# Patient Record
Sex: Female | Born: 1965 | Race: White | Hispanic: No | Smoking: Never smoker
Health system: Southern US, Community
[De-identification: ages and names within clinical notes are randomized; demographics above are authoritative.]

## PROBLEM LIST (undated history)

## (undated) DIAGNOSIS — K589 Irritable bowel syndrome without diarrhea: Secondary | ICD-10-CM

## (undated) DIAGNOSIS — R3915 Urgency of urination: Secondary | ICD-10-CM

## (undated) DIAGNOSIS — G8929 Other chronic pain: Secondary | ICD-10-CM

## (undated) DIAGNOSIS — F909 Attention-deficit hyperactivity disorder, unspecified type: Secondary | ICD-10-CM

## (undated) DIAGNOSIS — R110 Nausea: Secondary | ICD-10-CM

## (undated) DIAGNOSIS — D649 Anemia, unspecified: Secondary | ICD-10-CM

## (undated) DIAGNOSIS — R351 Nocturia: Secondary | ICD-10-CM

## (undated) DIAGNOSIS — R319 Hematuria, unspecified: Secondary | ICD-10-CM

## (undated) DIAGNOSIS — R3989 Other symptoms and signs involving the genitourinary system: Secondary | ICD-10-CM

## (undated) DIAGNOSIS — F988 Other specified behavioral and emotional disorders with onset usually occurring in childhood and adolescence: Secondary | ICD-10-CM

## (undated) DIAGNOSIS — R600 Localized edema: Secondary | ICD-10-CM

## (undated) DIAGNOSIS — D61818 Other pancytopenia: Secondary | ICD-10-CM

## (undated) DIAGNOSIS — G43909 Migraine, unspecified, not intractable, without status migrainosus: Secondary | ICD-10-CM

## (undated) DIAGNOSIS — T148XXA Other injury of unspecified body region, initial encounter: Secondary | ICD-10-CM

## (undated) DIAGNOSIS — R32 Unspecified urinary incontinence: Secondary | ICD-10-CM

## (undated) DIAGNOSIS — C539 Malignant neoplasm of cervix uteri, unspecified: Secondary | ICD-10-CM

## (undated) DIAGNOSIS — N135 Crossing vessel and stricture of ureter without hydronephrosis: Secondary | ICD-10-CM

## (undated) DIAGNOSIS — T8859XA Other complications of anesthesia, initial encounter: Secondary | ICD-10-CM

## (undated) DIAGNOSIS — N3020 Other chronic cystitis without hematuria: Secondary | ICD-10-CM

## (undated) DIAGNOSIS — R5383 Other fatigue: Secondary | ICD-10-CM

## (undated) DIAGNOSIS — C801 Malignant (primary) neoplasm, unspecified: Secondary | ICD-10-CM

## (undated) DIAGNOSIS — T451X5A Adverse effect of antineoplastic and immunosuppressive drugs, initial encounter: Secondary | ICD-10-CM

## (undated) HISTORY — DX: Malignant (primary) neoplasm, unspecified: C80.1

## (undated) HISTORY — DX: Other specified behavioral and emotional disorders with onset usually occurring in childhood and adolescence: F98.8

## (undated) HISTORY — DX: Migraine, unspecified, not intractable, without status migrainosus: G43.909

---
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
## 2015-02-12 HISTORY — PX: BREAST ENHANCEMENT SURGERY: SHX7

## 2015-10-11 ENCOUNTER — Ambulatory Visit (INDEPENDENT_AMBULATORY_CARE_PROVIDER_SITE_OTHER): Payer: BLUE CROSS/BLUE SHIELD | Admitting: Physician Assistant

## 2015-10-11 ENCOUNTER — Encounter: Payer: Self-pay | Admitting: Physician Assistant

## 2015-10-11 VITALS — BP 137/98 | HR 79 | Temp 97.1°F | Ht 63.0 in | Wt 133.2 lb

## 2015-10-11 DIAGNOSIS — K589 Irritable bowel syndrome without diarrhea: Secondary | ICD-10-CM

## 2015-10-11 DIAGNOSIS — J029 Acute pharyngitis, unspecified: Secondary | ICD-10-CM

## 2015-10-11 DIAGNOSIS — F900 Attention-deficit hyperactivity disorder, predominantly inattentive type: Secondary | ICD-10-CM

## 2015-10-11 DIAGNOSIS — G43809 Other migraine, not intractable, without status migrainosus: Secondary | ICD-10-CM | POA: Diagnosis not present

## 2015-10-11 MED ORDER — ADDERALL XR 30 MG PO CP24: 30.0000 mg | ORAL_CAPSULE | Freq: Every day | ORAL | 0 refills | Status: DC

## 2015-10-11 MED ORDER — AZITHROMYCIN 250 MG PO TABS: ORAL_TABLET | ORAL | 0 refills | Status: DC

## 2015-10-11 MED ORDER — SUMATRIPTAN SUCCINATE 100 MG PO TABS: 100.0000 mg | ORAL_TABLET | Freq: Once | ORAL | 11 refills | Status: DC

## 2015-10-11 NOTE — Progress Notes (Signed)
BP (!) 137/98   Pulse 79   Temp 97.1 F (36.2 C) (Oral)   Ht 5\' 3"  (1.6 m)   Wt 133 lb 3.2 oz (60.4 kg)   BMI 23.60 kg/m    Subjective:    Patient ID: Sheena Torres, female    DOB: Nov 12, 1965, 50 y.o.   MRN: [id number]  Sheena Torres is a 50 y.o. female presenting on 10/11/2015 for Medication Refill; Migraine (wants to discuss. She states that she has been experiencing them a lot lately. She would like to discuss possible CT scan.); and Sore Throat   HPI    Relevant past medical, surgical, family and social history reviewed and updated as indicated. Interim medical history since our last visit reviewed. Allergies and medications reviewed and updated.   Data reviewed from any sources in EPIC.  Review of Systems  Constitutional: Negative.  Negative for activity change, fatigue and fever.  HENT: Positive for congestion, sore throat and voice change.   Eyes: Negative.   Respiratory: Negative.  Negative for cough.   Cardiovascular: Negative.  Negative for chest pain.  Gastrointestinal: Positive for abdominal distention, abdominal pain and constipation.  Endocrine: Negative.   Genitourinary: Negative.  Negative for dysuria.  Musculoskeletal: Negative.   Skin: Negative.   Neurological: Negative.     Per HPI unless specifically indicated above  Social History   Social History  . Marital status: Divorced    Spouse name: N/A  . Number of children: N/A  . Years of education: N/A   Occupational History  . Not on file.   Social History Main Topics  . Smoking status: Never Smoker  . Smokeless tobacco: Never Used  . Alcohol use Yes     Comment: occasional   . Drug use: No  . Sexual activity: Not on file   Other Topics Concern  . Not on file   Social History Narrative  . No narrative on file    History reviewed. No pertinent surgical history.  Family History  Problem Relation Age of Onset  . Heart disease Mother   . Diabetes Father       Medication List         Accurate as of 10/11/15  9:04 AM. Always use your most recent med list.          ADDERALL XR 30 MG 24 hr capsule Generic drug:  amphetamine-dextroamphetamine Take 1 capsule (30 mg total) by mouth daily. Fill 60 days from original script date   azithromycin 250 MG tablet Commonly known as:  ZITHROMAX Z-PAK As directed   naproxen sodium 220 MG tablet Commonly known as:  ANAPROX Take 220 mg by mouth 2 (two) times daily with a meal.   SUMAtriptan 100 MG tablet Commonly known as:  IMITREX Take 1 tablet (100 mg total) by mouth once. May repeat in 2 hours if no relief.          Objective:    BP (!) 137/98   Pulse 79   Temp 97.1 F (36.2 C) (Oral)   Ht 5\' 3"  (1.6 m)   Wt 133 lb 3.2 oz (60.4 kg)   BMI 23.60 kg/m   No Known Allergies Wt Readings from Last 3 Encounters:  10/11/15 133 lb 3.2 oz (60.4 kg)    Physical Exam  Constitutional: She is oriented to person, place, and time. She appears well-developed and well-nourished.  HENT:  Head: Normocephalic and atraumatic.  Right Ear: Tympanic membrane, external ear and ear canal normal.  Left  Ear: Tympanic membrane, external ear and ear canal normal.  Nose: Mucosal edema and rhinorrhea present.  Mouth/Throat: Uvula swelling present. Oropharyngeal exudate and posterior oropharyngeal erythema present.  Eyes: Conjunctivae and EOM are normal. Pupils are equal, round, and reactive to light.  Neck: Normal range of motion. Neck supple.  Cardiovascular: Normal rate, regular rhythm, normal heart sounds and intact distal pulses.   Pulmonary/Chest: Effort normal and breath sounds normal.  Abdominal: Soft. Bowel sounds are normal.  Neurological: She is alert and oriented to person, place, and time. She has normal reflexes.  Skin: Skin is warm and dry. No rash noted.  Psychiatric: She has a normal mood and affect. Her behavior is normal. Judgment and thought content normal.    No results found for this or any previous visit.     Assessment & Plan:   1. Other type of migraine without status migrainosus Avoid triggers, consider prevention propranolol Patient to find out about her insurance coverage for radiology services - naproxen sodium (ANAPROX) 220 MG tablet; Take 220 mg by mouth 2 (two) times daily with a meal. - SUMAtriptan (IMITREX) 100 MG tablet; Take 1 tablet (100 mg total) by mouth once. May repeat in 2 hours if no relief.  Dispense: 10 tablet; Refill: 11  2. ADHD (attention deficit hyperactivity disorder), inattentive type - ADDERALL XR 30 MG 24 hr capsule; Take 1 capsule (30 mg total) by mouth daily. Fill 60 days from original script date  Dispense: 30 capsule; Refill: 0  3. Acute pharyngitis, unspecified etiology - azithromycin (ZITHROMAX Z-PAK) 250 MG tablet; As directed  Dispense: 6 tablet; Refill: 0  4. IBS (irritable bowel syndrome) Dietary changes and monitor trigger foods   Continue all other maintenance medications as listed above.  Follow up plan: Return in about 3 months (around 01/11/2016), or recheck meds.  Terald Sleeper PA-C [facility] [address]  [place], [place] [address] [phone]   10/11/2015, 9:04 AM

## 2015-10-11 NOTE — Patient Instructions (Signed)
Diet for Irritable Bowel Syndrome When you have irritable bowel syndrome (IBS), the foods you eat and your eating habits are very important. IBS may cause various symptoms, such as abdominal pain, constipation, or diarrhea. Choosing the right foods can help ease discomfort caused by these symptoms. Work with your health care provider and dietitian to find the best eating plan to help control your symptoms. WHAT GENERAL GUIDELINES DO I NEED TO FOLLOW?  Keep a food diary. This will help you identify foods that cause symptoms. Write down:  What you eat and when.  What symptoms you have.  When symptoms occur in relation to your meals.  Avoid foods that cause symptoms. Talk with your dietitian about other ways to get the same nutrients that are in these foods.  Eat more foods that contain fiber. Take a fiber supplement if directed by your dietitian.  Eat your meals slowly, in a relaxed setting.  Aim to eat 5-6 small meals per day. Do not skip meals.  Drink enough fluids to keep your urine clear or pale yellow.  Ask your health care provider if you should take an over-the-counter probiotic during flare-ups to help restore healthy gut bacteria.  If you have cramping or diarrhea, try making your meals low in fat and high in carbohydrates. Examples of carbohydrates are pasta, rice, whole grain breads and cereals, fruits, and vegetables.  If dairy products cause your symptoms to flare up, try eating less of them. You might be able to handle yogurt better than other dairy products because it contains bacteria that help with digestion. WHAT FOODS ARE NOT RECOMMENDED? The following are some foods and drinks that may worsen your symptoms:  Fatty foods, such as French fries.  Milk products, such as cheese or ice cream.  Chocolate.  Alcohol.  Products with caffeine, such as coffee.  Carbonated drinks, such as soda. The items listed above may not be a complete list of foods and beverages to  avoid. Contact your dietitian for more information. WHAT FOODS ARE GOOD SOURCES OF FIBER? Your health care provider or dietitian may recommend that you eat more foods that contain fiber. Fiber can help reduce constipation and other IBS symptoms. Add foods with fiber to your diet a little at a time so that your body can get used to them. Too much fiber at once might cause gas and swelling of your abdomen. The following are some foods that are good sources of fiber:  Apples.  Peaches.  Pears.  Berries.  Figs.  Broccoli (raw).  Cabbage.  Carrots.  Raw peas.  Kidney beans.  Lima beans.  Whole grain bread.  Whole grain cereal. FOR MORE INFORMATION  International Foundation for Functional Gastrointestinal Disorders: www.iffgd.org National Institute of Diabetes and Digestive and Kidney Diseases: www.niddk.nih.gov/health-information/health-topics/digestive-diseases/ibs/Pages/facts.aspx   This information is not intended to replace advice given to you by your health care provider. Make sure you discuss any questions you have with your health care provider.   Document Released: 04/20/2003 Document Revised: 02/18/2014 Document Reviewed: 04/30/2013 Elsevier Interactive Patient Education 2016 Elsevier Inc.  

## 2016-01-12 ENCOUNTER — Ambulatory Visit (INDEPENDENT_AMBULATORY_CARE_PROVIDER_SITE_OTHER): Payer: Medicaid Other | Admitting: Physician Assistant

## 2016-01-12 ENCOUNTER — Encounter: Payer: Self-pay | Admitting: Physician Assistant

## 2016-01-12 ENCOUNTER — Telehealth: Payer: Self-pay | Admitting: Physician Assistant

## 2016-01-12 VITALS — BP 128/92 | HR 81 | Temp 97.0°F | Ht 63.0 in | Wt 139.2 lb

## 2016-01-12 DIAGNOSIS — J029 Acute pharyngitis, unspecified: Secondary | ICD-10-CM

## 2016-01-12 DIAGNOSIS — F900 Attention-deficit hyperactivity disorder, predominantly inattentive type: Secondary | ICD-10-CM | POA: Diagnosis not present

## 2016-01-12 DIAGNOSIS — M25469 Effusion, unspecified knee: Secondary | ICD-10-CM | POA: Diagnosis not present

## 2016-01-12 DIAGNOSIS — Z23 Encounter for immunization: Secondary | ICD-10-CM | POA: Diagnosis not present

## 2016-01-12 LAB — CULTURE, GROUP A STREP

## 2016-01-12 LAB — RAPID STREP SCREEN (MED CTR MEBANE ONLY): Strep Gp A Ag, IA W/Reflex: NEGATIVE

## 2016-01-12 MED ORDER — ADDERALL XR 30 MG PO CP24: 30.0000 mg | ORAL_CAPSULE | Freq: Every day | ORAL | 0 refills | Status: DC

## 2016-01-12 MED ORDER — SUMATRIPTAN SUCCINATE 100 MG PO TABS: 100.0000 mg | ORAL_TABLET | Freq: Once | ORAL | 11 refills | Status: DC

## 2016-01-12 MED ORDER — AMPHETAMINE-DEXTROAMPHET ER 30 MG PO CP24: 30.0000 mg | ORAL_CAPSULE | Freq: Every day | ORAL | 0 refills | Status: DC

## 2016-01-12 NOTE — Progress Notes (Signed)
BP (!) 128/92   Pulse 81   Temp 97 F (36.1 C) (Oral)   Ht 5\' 3"  (1.6 m)   Wt 139 lb 3.2 oz (63.1 kg)   Breastfeeding? Unknown   BMI 24.66 kg/m    Subjective:    Patient ID: Sheena Torres, female    DOB: [birth year], 50 y.o.   MRN: [id number]  HPI: Sheena Torres is a 50 y.o. female presenting on 01/12/2016 for Sore Throat; Follow-up; and Medication Refill This patient comes in for periodic recheck on medications and conditions. All medications are reviewed today. There are no reports of any problems with the medications. All of the medical conditions are reviewed and updated.  Lab work is reviewed and will be ordered as medically necessary. There are no new problems reported with today's visit.  Adult ADHD symptoms 1. Fidgeting 0 2. Does not seem to listen to what is being said to him/her 1 3 .Doesn't pay attention to details; makes careless mistakes 1 4. Inattentative, easily distracted. 2 5. Has trouble organizing tasks or activities 2 6. Gives up easily on difficult tasks.1 7. Fidgets or squirms in seat 0 8. Restless or overactive 0 9. Is easily distracted by sights and sounds 1 10. Interrupts others 1  SCORE 9   Past Medical History:  Diagnosis Date  . ADD (attention deficit disorder)   . Migraine    Relevant past medical, surgical, family and social history reviewed and updated as indicated. Interim medical history since our last visit reviewed. Allergies and medications reviewed and updated. DATA REVIEWED: CHART IN EPIC  Social History   Social History  . Marital status: Divorced    Spouse name: N/A  . Number of children: N/A  . Years of education: N/A   Occupational History  . Not on file.   Social History Main Topics  . Smoking status: Never Smoker  . Smokeless tobacco: Never Used  . Alcohol use Yes     Comment: occasional   . Drug use: No  . Sexual activity: Not on file   Other Topics Concern  . Not on file   Social History Narrative  . No  narrative on file    History reviewed. No pertinent surgical history.  Family History  Problem Relation Age of Onset  . Heart disease Mother   . Diabetes Father     Review of Systems  Constitutional: Negative.  Negative for activity change, fatigue and fever.  HENT: Negative.   Eyes: Negative.   Respiratory: Negative.  Negative for cough.   Cardiovascular: Negative.  Negative for chest pain.  Gastrointestinal: Negative.  Negative for abdominal pain.  Endocrine: Negative.   Genitourinary: Negative.  Negative for dysuria.  Musculoskeletal: Negative.   Skin: Negative.   Neurological: Negative.       Medication List       Accurate as of 01/12/16 11:59 PM. Always use your most recent med list.          ADDERALL XR 30 MG 24 hr capsule Generic drug:  amphetamine-dextroamphetamine Take 1 capsule (30 mg total) by mouth daily. Fill 60 days from original script date   amphetamine-dextroamphetamine 30 MG 24 hr capsule Commonly known as:  ADDERALL XR Take 1 capsule (30 mg total) by mouth daily.   amphetamine-dextroamphetamine 30 MG 24 hr capsule Commonly known as:  ADDERALL XR Take 1 capsule (30 mg total) by mouth daily.   naproxen sodium 220 MG tablet Commonly known as:  ANAPROX Take 220 mg by mouth  2 (two) times daily with a meal.   SUMAtriptan 100 MG tablet Commonly known as:  IMITREX Take 1 tablet (100 mg total) by mouth once. May repeat in 2 hours if no relief.          Objective:    BP (!) 128/92   Pulse 81   Temp 97 F (36.1 C) (Oral)   Ht 5\' 3"  (1.6 m)   Wt 139 lb 3.2 oz (63.1 kg)   Breastfeeding? Unknown   BMI 24.66 kg/m   No Known Allergies  Wt Readings from Last 3 Encounters:  01/12/16 139 lb 3.2 oz (63.1 kg)  10/11/15 133 lb 3.2 oz (60.4 kg)    Physical Exam  Constitutional: She is oriented to person, place, and time. She appears well-developed and well-nourished.  HENT:  Head: Normocephalic and atraumatic.  Eyes: Conjunctivae and EOM are  normal. Pupils are equal, round, and reactive to light.  Neck: Normal range of motion. Neck supple.  Cardiovascular: Normal rate, regular rhythm, normal heart sounds and intact distal pulses.   Pulmonary/Chest: Effort normal and breath sounds normal.  Abdominal: Soft. Bowel sounds are normal.  Neurological: She is alert and oriented to person, place, and time. She has normal reflexes.  Skin: Skin is warm and dry. No rash noted.  Psychiatric: She has a normal mood and affect. Her behavior is normal. Judgment and thought content normal.    Results for orders placed or performed in visit on 01/12/16  Rapid strep screen (not at [facility])  Result Value Ref Range   Strep Gp A Ag, IA W/Reflex Negative Negative  Culture, Group A Strep  Result Value Ref Range   Strep A Culture CANCELED       Assessment & Plan:   1. Sore throat URI, supportive - Rapid strep screen (not at [facility])  2. ADHD (attention deficit hyperactivity disorder), inattentive type - ADDERALL XR 30 MG 24 hr capsule; Take 1 capsule (30 mg total) by mouth daily. Fill 60 days from original script date  Dispense: 30 capsule; Refill: 0 - amphetamine-dextroamphetamine (ADDERALL XR) 30 MG 24 hr capsule; Take 1 capsule (30 mg total) by mouth daily.  Dispense: 30 capsule; Refill: 0 - amphetamine-dextroamphetamine (ADDERALL XR) 30 MG 24 hr capsule; Take 1 capsule (30 mg total) by mouth daily.  Dispense: 30 capsule; Refill: 0  3. Knee swelling  4. Encounter for immunization - Flu Vaccine QUAD 36+ mos IM   Continue all other maintenance medications as listed above.  Follow up plan: Return in about 3 months (around 04/11/2016) for recheck meds.  Orders Placed This Encounter  Procedures  . Rapid strep screen (not at [facility])  . Culture, Group A Strep  . Flu Vaccine QUAD 36+ mos IM    Educational handout given for ADHD  Terald Sleeper PA-C [facility] [address]  [place], [place]  [address] [phone]   01/15/2016, 10:03 PM

## 2016-01-12 NOTE — Telephone Encounter (Signed)
Note given to patient.

## 2016-01-12 NOTE — Patient Instructions (Signed)

## 2016-06-06 ENCOUNTER — Telehealth: Payer: Self-pay | Admitting: Physician Assistant

## 2016-06-06 NOTE — Telephone Encounter (Signed)
appts scheduled Pt notified

## 2016-06-14 ENCOUNTER — Ambulatory Visit (INDEPENDENT_AMBULATORY_CARE_PROVIDER_SITE_OTHER): Payer: Medicaid Other | Admitting: Physician Assistant

## 2016-06-14 ENCOUNTER — Encounter: Payer: Self-pay | Admitting: Physician Assistant

## 2016-06-14 ENCOUNTER — Encounter (INDEPENDENT_AMBULATORY_CARE_PROVIDER_SITE_OTHER): Payer: Self-pay

## 2016-06-14 VITALS — BP 104/68 | HR 74 | Temp 98.0°F | Ht 63.0 in | Wt 137.0 lb

## 2016-06-14 DIAGNOSIS — D229 Melanocytic nevi, unspecified: Secondary | ICD-10-CM

## 2016-06-14 DIAGNOSIS — F900 Attention-deficit hyperactivity disorder, predominantly inattentive type: Secondary | ICD-10-CM | POA: Diagnosis not present

## 2016-06-14 MED ORDER — ADDERALL XR 30 MG PO CP24: 30.0000 mg | ORAL_CAPSULE | Freq: Every day | ORAL | 0 refills | Status: DC

## 2016-06-14 MED ORDER — AMPHETAMINE-DEXTROAMPHET ER 30 MG PO CP24: 30.0000 mg | ORAL_CAPSULE | Freq: Every day | ORAL | 0 refills | Status: DC

## 2016-06-14 NOTE — Patient Instructions (Signed)
Seborrheic Keratosis Seborrheic keratosis is a common, noncancerous (benign) skin growth. This condition causes waxy, rough, tan, brown, or black spots to appear on the skin. These skin growths can be flat or raised. What are the causes? The cause of this condition is not known. What increases the risk? This condition is more likely to develop in:  People who have a family history of seborrheic keratosis.  People who are 50 or older.  People who are pregnant.  People who have had estrogen replacement therapy.  What are the signs or symptoms? This condition often occurs on the face, chest, shoulders, back, or other areas. These growths:  Are usually painless, but may become irritated and itchy.  Can be yellow, brown, black, or other colors.  Are slightly raised or have a flat surface.  Are sometimes rough or wart-like in texture.  Are often waxy on the surface.  Are round or oval-shaped.  Sometimes look like they are "stuck on."  Often occur in groups, but may occur as a single growth.  How is this diagnosed? This condition is diagnosed with a medical history and physical exam. A sample of the growth may be tested (skin biopsy). You may need to see a skin specialist (dermatologist). How is this treated? Treatment is not usually needed for this condition, unless the growths are irritated or are often bleeding. You may also choose to have the growths removed if you do not like their appearance. Most commonly, these growths are treated with a procedure in which liquid nitrogen is applied to "freeze" off the growth (cryosurgery). They may also be burned off with electricity or cut off. Follow these instructions at home:  Watch your growth for any changes.  Keep all follow-up visits as told by your health care provider. This is important.  Do not scratch or pick at the growth or growths. This can cause them to become irritated or infected. Contact a health care provider  if:  You suddenly have many new growths.  Your growth bleeds, itches, or hurts.  Your growth suddenly becomes larger or changes color. This information is not intended to replace advice given to you by your health care provider. Make sure you discuss any questions you have with your health care provider. Document Released: 03/02/2010 Document Revised: 07/06/2015 Document Reviewed: 06/15/2014 Elsevier Interactive Patient Education  2017 Elsevier Inc.  

## 2016-06-17 NOTE — Progress Notes (Signed)
BP 104/68   Pulse 74   Temp 98 F (36.7 C) (Oral)   Ht 5\' 3"  (1.6 m)   Wt 137 lb (62.1 kg)   BMI 24.27 kg/m    Subjective:    Patient ID: Rosary Filosa, female    DOB: Jul 03, 1965, 51 y.o.   MRN: [id number]  HPI: Roya Gieselman is a 51 y.o. female presenting on 06/14/2016 for Referral (to Dermatology for ? skin Cancer )  This patient comes in for periodic recheck on medications and conditions including abnormal nevus on chest.  ADHD meds are due.   All medications are reviewed today. There are no reports of any problems with the medications. All of the medical conditions are reviewed and updated.  Lab work is reviewed and will be ordered as medically necessary. There are no new problems reported with today's visit.   Relevant past medical, surgical, family and social history reviewed and updated as indicated. Allergies and medications reviewed and updated.  Past Medical History:  Diagnosis Date  . ADD (attention deficit disorder)   . Migraine     History reviewed. No pertinent surgical history.  Review of Systems  Constitutional: Negative.   HENT: Negative.   Eyes: Negative.   Respiratory: Negative.   Gastrointestinal: Negative.   Genitourinary: Negative.   Skin: Positive for rash.    Allergies as of 06/14/2016   No Known Allergies     Medication List       Accurate as of 06/14/16 11:59 PM. Always use your most recent med list.          ADDERALL XR 30 MG 24 hr capsule Generic drug:  amphetamine-dextroamphetamine Take 1 capsule (30 mg total) by mouth daily. Fill 60 days from original script date   amphetamine-dextroamphetamine 30 MG 24 hr capsule Commonly known as:  ADDERALL XR Take 1 capsule (30 mg total) by mouth daily.   amphetamine-dextroamphetamine 30 MG 24 hr capsule Commonly known as:  ADDERALL XR Take 1 capsule (30 mg total) by mouth daily.   naproxen sodium 220 MG tablet Commonly known as:  ANAPROX Take 220 mg by mouth 2 (two) times daily with a  meal.   SUMAtriptan 100 MG tablet Commonly known as:  IMITREX Take 1 tablet (100 mg total) by mouth once. May repeat in 2 hours if no relief.          Objective:    BP 104/68   Pulse 74   Temp 98 F (36.7 C) (Oral)   Ht 5\' 3"  (1.6 m)   Wt 137 lb (62.1 kg)   BMI 24.27 kg/m   No Known Allergies  Physical Exam  Constitutional: She is oriented to person, place, and time. She appears well-developed and well-nourished.  HENT:  Head: Normocephalic and atraumatic.  Eyes: Conjunctivae and EOM are normal. Pupils are equal, round, and reactive to light.  Cardiovascular: Normal rate, regular rhythm, normal heart sounds and intact distal pulses.   Pulmonary/Chest: Effort normal and breath sounds normal.  Abdominal: Soft. Bowel sounds are normal.  Neurological: She is alert and oriented to person, place, and time. She has normal reflexes.  Skin: Skin is warm and dry. Rash noted. Rash is maculopapular. There is erythema.     Psychiatric: She has a normal mood and affect. Her behavior is normal. Judgment and thought content normal.    Results for orders placed or performed in visit on 01/12/16  Rapid strep screen (not at [facility])  Result Value Ref Range   Strep  Gp A Ag, IA W/Reflex Negative Negative  Culture, Group A Strep  Result Value Ref Range   Strep A Culture CANCELED       Assessment & Plan:   1. ADHD (attention deficit hyperactivity disorder), inattentive type - ADDERALL XR 30 MG 24 hr capsule; Take 1 capsule (30 mg total) by mouth daily. Fill 60 days from original script date  Dispense: 30 capsule; Refill: 0 - amphetamine-dextroamphetamine (ADDERALL XR) 30 MG 24 hr capsule; Take 1 capsule (30 mg total) by mouth daily.  Dispense: 30 capsule; Refill: 0 - amphetamine-dextroamphetamine (ADDERALL XR) 30 MG 24 hr capsule; Take 1 capsule (30 mg total) by mouth daily.  Dispense: 30 capsule; Refill: 0  2. Nevus - Ambulatory referral to Dermatology   Continue all other maintenance  medications as listed above.  Follow up plan: No Follow-up on file.  Educational handout given for nevus  Terald Sleeper PA-C [facility] [address]  [place], [place] [address] [phone]   06/17/2016, 12:52 PM

## 2016-06-18 ENCOUNTER — Ambulatory Visit: Payer: Medicaid Other | Admitting: Physician Assistant

## 2016-07-25 ENCOUNTER — Ambulatory Visit (INDEPENDENT_AMBULATORY_CARE_PROVIDER_SITE_OTHER): Payer: Medicaid Other | Admitting: Pediatrics

## 2016-07-25 ENCOUNTER — Encounter: Payer: Self-pay | Admitting: Pediatrics

## 2016-07-25 VITALS — BP 134/90 | HR 79 | Temp 98.2°F | Ht 63.0 in | Wt 135.2 lb

## 2016-07-25 DIAGNOSIS — J069 Acute upper respiratory infection, unspecified: Secondary | ICD-10-CM | POA: Diagnosis not present

## 2016-07-25 DIAGNOSIS — J029 Acute pharyngitis, unspecified: Secondary | ICD-10-CM | POA: Diagnosis not present

## 2016-07-25 LAB — RAPID STREP SCREEN (MED CTR MEBANE ONLY): Strep Gp A Ag, IA W/Reflex: NEGATIVE

## 2016-07-25 LAB — CULTURE, GROUP A STREP

## 2016-07-25 MED ORDER — AMOXICILLIN 875 MG PO TABS: 875.0000 mg | ORAL_TABLET | Freq: Two times a day (BID) | ORAL | 0 refills | Status: DC

## 2016-07-25 NOTE — Progress Notes (Signed)
  Subjective:   Patient ID: Sheena Torres, female    DOB: 19-Oct-1965, 51 y.o.   MRN: [id number] CC: Sore Throat  HPI: Sheena Torres is a 51 y.o. female presenting for Sore Throat  Started several days ago Feeling worse today No fevers Appetite is ok Some coughing Throat feels scratchy Some congestion  Relevant past medical, surgical, family and social history reviewed. Allergies and medications reviewed and updated. History  Smoking Status  . Never Smoker  Smokeless Tobacco  . Never Used   ROS: Per HPI   Objective:    BP 134/90   Pulse 79   Temp 98.2 F (36.8 C) (Oral)   Ht 5\' 3"  (1.6 m)   Wt 135 lb 3.2 oz (61.3 kg)   BMI 23.95 kg/m   Wt Readings from Last 3 Encounters:  07/25/16 135 lb 3.2 oz (61.3 kg)  06/14/16 137 lb (62.1 kg)  01/12/16 139 lb 3.2 oz (63.1 kg)    Gen: NAD, alert, cooperative with exam, NCAT EYES: EOMI, no conjunctival injection, or no icterus ENT:  TMs with clear effusion, OP with mild erythema, no pressure over sinuses LYMPH: no cervical LAD CV: NRRR, normal S1/S2, no murmur Resp: CTABL, no wheezes, normal WOB Ext: No edema, warm Neuro: Alert and oriented  Assessment & Plan:  Tala was seen today for sore throat.  Diagnoses and all orders for this visit:  Sore throat Discussed symptom care If worsening ear pain start below -     Rapid strep screen (not at [facility])  Acute URI  Other orders -     amoxicillin (AMOXIL) 875 MG tablet; Take 1 tablet (875 mg total) by mouth 2 (two) times daily. -     Culture, Group A Strep   Follow up plan: As needed Assunta Found, MD [place]

## 2016-07-25 NOTE — Patient Instructions (Addendum)
Netipot with distilled water 2-3 times a day to clear out sinuses Or Normal saline nasal spray  Flonase steroid nasal spray  Antihistamine daily such as cetirizine  Ibuprofen 600mg  three times a day  Lots of fluids

## 2016-10-10 ENCOUNTER — Ambulatory Visit: Payer: Medicaid Other | Admitting: Family Medicine

## 2016-11-14 ENCOUNTER — Encounter: Payer: Self-pay | Admitting: Family Medicine

## 2016-11-14 ENCOUNTER — Ambulatory Visit (INDEPENDENT_AMBULATORY_CARE_PROVIDER_SITE_OTHER): Payer: Self-pay | Admitting: Family Medicine

## 2016-11-14 VITALS — BP 128/86 | HR 79 | Temp 99.8°F | Ht 63.0 in | Wt 134.0 lb

## 2016-11-14 DIAGNOSIS — J020 Streptococcal pharyngitis: Secondary | ICD-10-CM

## 2016-11-14 MED ORDER — AMOXICILLIN 500 MG PO TABS: 500.0000 mg | ORAL_TABLET | Freq: Two times a day (BID) | ORAL | 0 refills | Status: DC

## 2016-11-14 NOTE — Progress Notes (Signed)
BP 128/86   Pulse 79   Temp 99.8 F (37.7 C) (Oral)   Ht 5\' 3"  (1.6 m)   Wt 134 lb (60.8 kg)   BMI 23.74 kg/m    Subjective:    Patient ID: Sheena Torres, female    DOB: 1965-11-09, 51 y.o.   MRN: [id number]  HPI: [name] is a 51 y.o. female presenting on 11/14/2016 for 2 days of constant severe sore throat making it difficult to sleep. She additionally reports fullness in her ears bilaterally x 1 week L>R. She has associated subjective fever and chills. She denies cough, congestion, nausea, vomiting, belly pain, or rash.   HPI Relevant past medical, surgical, family and social history reviewed and updated as indicated. Interim medical history since our last visit reviewed. Allergies and medications reviewed and updated.  Review of Systems  Constitutional: Positive for activity change, chills, fatigue and fever.  HENT: Positive for sore throat. Negative for congestion and rhinorrhea.   Respiratory: Negative for cough, shortness of breath and wheezing.   Cardiovascular: Negative for chest pain, palpitations and leg swelling.  Gastrointestinal: Negative for abdominal pain, nausea and vomiting.  Musculoskeletal: Negative for arthralgias, joint swelling and myalgias.  Skin: Negative for color change, pallor, rash and wound.  Neurological: Positive for headaches. Negative for dizziness and weakness.  Psychiatric/Behavioral: Negative for agitation, behavioral problems and confusion.    Per HPI unless specifically indicated above     Objective:    BP 128/86   Pulse 79   Temp 99.8 F (37.7 C) (Oral)   Ht 5\' 3"  (1.6 m)   Wt 134 lb (60.8 kg)   BMI 23.74 kg/m   Wt Readings from Last 3 Encounters:  11/14/16 134 lb (60.8 kg)  07/25/16 135 lb 3.2 oz (61.3 kg)  06/14/16 137 lb (62.1 kg)    Physical Exam  Constitutional: She is oriented to person, place, and time. She appears well-developed and well-nourished. No distress.  HENT:  Head: Normocephalic and atraumatic.  Right  Ear: External ear normal.  Left Ear: External ear normal.  Mouth/Throat: Oropharyngeal exudate present.  Eyes: Pupils are equal, round, and reactive to light. Conjunctivae and EOM are normal.  Neck: Normal range of motion. Neck supple.  Cardiovascular: Normal rate, regular rhythm and normal heart sounds.   No murmur heard. Pulmonary/Chest: Effort normal and breath sounds normal. No respiratory distress. She has no wheezes. She has no rales. She exhibits no tenderness.  Abdominal: Soft. Bowel sounds are normal. There is no tenderness.  Musculoskeletal: Normal range of motion. She exhibits no edema or tenderness.  Lymphadenopathy:    She has cervical adenopathy.  Neurological: She is alert and oriented to person, place, and time. She has normal reflexes.  Skin: Skin is warm and dry. No rash noted. She is not diaphoretic. No erythema. No pallor.  Psychiatric: She has a normal mood and affect. Her behavior is normal. Judgment and thought content normal.  Nursing note and vitals reviewed.       Assessment & Plan:   Problem List Items Addressed This Visit    None    Visit Diagnoses    Streptococcal pharyngitis    -  Primary   Relevant Medications   amoxicillin (AMOXIL) 500 MG tablet      Sheena Torres is a 51 y.o. female presenting on 11/14/2016 for 2 days of constant severe sore throat making it difficult to sleep. She additionally reports fullness in her ears bilaterally x 1 week L>R. She has  associated subjective fever and chills. On exam she has bilateral tonsillar exudate and tender cervical lymphadenopathy. Based on this she has a Centor score of 2-3 (her temperature was 37.7 here but she's been feverish at home), and with less than 3 days of illness I will treat her empirically for strep with amoxicillin. I additionally wrote her off work for today and tomorrow.   Follow up plan: Return if symptoms worsen or fail to improve.  Patient seen and examined with Brock Bad medical  student. Agree with assessment and plan above. Caryl Pina, MD [facility] 11/14/2016, 9:12 AM

## 2016-11-15 ENCOUNTER — Ambulatory Visit (INDEPENDENT_AMBULATORY_CARE_PROVIDER_SITE_OTHER): Payer: Self-pay | Admitting: Family Medicine

## 2016-11-15 ENCOUNTER — Encounter: Payer: Self-pay | Admitting: Family Medicine

## 2016-11-15 ENCOUNTER — Telehealth: Payer: Self-pay

## 2016-11-15 VITALS — BP 126/89 | HR 105 | Temp 99.4°F | Ht 63.0 in | Wt 132.0 lb

## 2016-11-15 DIAGNOSIS — J039 Acute tonsillitis, unspecified: Secondary | ICD-10-CM

## 2016-11-15 MED ORDER — AMOXICILLIN-POT CLAVULANATE 400-57 MG/5ML PO SUSR: 800.0000 mg | Freq: Two times a day (BID) | ORAL | 0 refills | Status: DC

## 2016-11-15 NOTE — Telephone Encounter (Signed)
She has been on it for less than one day, if she is worse she can come back in and be seen, otherwise give it a chance to work. It has not even been 12 hours since she was seen

## 2016-11-15 NOTE — Telephone Encounter (Signed)
Patient aware and states she talked to the pharmacy and they told her she needed something stronger than amoxicillin. Please call in patient a different rx. Patient advised she needed to go to ER and refused due to the price.

## 2016-11-15 NOTE — Telephone Encounter (Signed)
Go ahead and send the amoxicillin as a liquid, same dose 500mg  bid for 10 days, I agree that if her throat is closing then she needs to go to ED

## 2016-11-15 NOTE — Telephone Encounter (Signed)
Rx phoned in to [facility] drug store. Only have 250 mg so they did the equivalent.

## 2016-11-15 NOTE — Progress Notes (Signed)
Chief Complaint  Patient presents with  . Sore Throat    pt here today after being seen yesterday and treated for strep but her throat is worse and her left ear is hurting more. She is self pay and doesn't want culutre/labs due to cost.    HPI  Patient presents today for Recheck of sore throat. Yesterday she was prescribed amoxicillin and the pharmacist told her wasn't strong enough. Patient says she is much worse today. She tries to swallow and water just comes out through her nose is so severe. She thinks she can swallow liquid antibiotic. However she is hoarse and her throat feels tight. She has no cough or shortness of breath. She has not checked her temperature at home but is noted that she had low-grade fever in the office yesterday.  PMH: Smoking status noted ROS: Per HPI  Objective: BP 126/89   Pulse (!) 105   Temp 99.4 F (37.4 C) (Oral)   Ht 5\' 3"  (1.6 m)   Wt 132 lb (59.9 kg)   BMI 23.38 kg/m  Gen: NAD, alert, cooperative with exam. Low-grade fever noted. HEENT: NCAT, EOMI, PERRL. She has marked exudates on both tonsils. They are mildly to moderately enlarged and erythematous. The pharynx is open and there is no significant posterior pharyngeal edema. There is a great deal of tenderness at the anterior cervical lymph nodes.  They are 2+ enlarged CV: RRR, good S1/S2, no murmur Resp: CTABL, no wheezes, non-labored Abd: SNTND, BS present, no guarding or organomegaly Ext: No edema, warm Neuro: Alert and oriented, No gross deficits  Assessment and plan:  1. Tonsillitis     Meds ordered this encounter  Medications  . amoxicillin-clavulanate (AUGMENTIN) 400-57 MG/5ML suspension    Sig: Take 10 mLs (800 mg total) by mouth 2 (two) times daily.    Dispense:  200 mL    Refill:  0    Celestone 1 mL IM administered as well for the inflammation. Patient advised to seek immediate help if throat closed and she became short of breath  Follow up as needed.  Claretta Fraise,  MD

## 2016-11-15 NOTE — Telephone Encounter (Signed)
Patient seen [name] yesterday for strep throat. States she feels worse and feels like her throat is closing up on the left side. Also states that it is very hard to swallow. Advised patient several times she needs to go to the ER if she was having trouble swallowing and felt like her throat was closing up. Patient refused every time that she did not have the money to go and just wants a liquid antibiotic so she can take it since she can not swallow the pills. Please advise.

## 2016-11-27 ENCOUNTER — Telehealth: Payer: Self-pay | Admitting: Physician Assistant

## 2016-11-27 MED ORDER — FLUCONAZOLE 150 MG PO TABS: 150.0000 mg | ORAL_TABLET | Freq: Once | ORAL | 0 refills | Status: AC

## 2016-11-27 NOTE — Telephone Encounter (Signed)
Pt is on abx and is now c/o of vaginal itching and discharge x4days. Has not tried anything otc yet. Wants diflucan called in. The drug store in [place]. NKDA

## 2016-11-27 NOTE — Telephone Encounter (Signed)
Patient aware.

## 2017-02-25 ENCOUNTER — Ambulatory Visit: Payer: Self-pay | Admitting: Physician Assistant

## 2017-03-17 ENCOUNTER — Other Ambulatory Visit: Payer: Self-pay | Admitting: Physician Assistant

## 2017-03-17 DIAGNOSIS — M25469 Effusion, unspecified knee: Secondary | ICD-10-CM

## 2017-03-17 MED ORDER — NAPROXEN SODIUM 220 MG PO TABS: 220.0000 mg | ORAL_TABLET | Freq: Two times a day (BID) | ORAL | 5 refills | Status: DC

## 2017-03-17 MED ORDER — SUMATRIPTAN SUCCINATE 100 MG PO TABS: 100.0000 mg | ORAL_TABLET | Freq: Once | ORAL | 11 refills | Status: DC

## 2017-04-25 ENCOUNTER — Encounter: Payer: Self-pay | Admitting: Family Medicine

## 2017-04-25 ENCOUNTER — Ambulatory Visit (INDEPENDENT_AMBULATORY_CARE_PROVIDER_SITE_OTHER): Payer: Self-pay | Admitting: Family Medicine

## 2017-04-25 VITALS — BP 110/76 | HR 102 | Temp 98.8°F | Ht 63.0 in | Wt 141.8 lb

## 2017-04-25 DIAGNOSIS — J020 Streptococcal pharyngitis: Secondary | ICD-10-CM

## 2017-04-25 MED ORDER — AMOXICILLIN-POT CLAVULANATE 875-125 MG PO TABS: 1.0000 | ORAL_TABLET | Freq: Two times a day (BID) | ORAL | 0 refills | Status: DC

## 2017-04-25 MED ORDER — BETAMETHASONE SOD PHOS & ACET 6 (3-3) MG/ML IJ SUSP
6.0000 mg | Freq: Once | INTRAMUSCULAR | Status: AC
  Administered 2017-04-25: 6 mg via INTRAMUSCULAR

## 2017-04-25 NOTE — Progress Notes (Signed)
Chief Complaint  Patient presents with  . Sore Throat    HPI  Patient presents today for 3 days of increasing pain in the throat.  For the last day and a half she has not been able to talk other than just scratchy raspy whisper.  She has had no fever chills or sweats minimal cough and no headache.  It is very difficult for her to swallow.  PMH: Smoking status noted ROS: Per HPI  Objective: BP 110/76   Pulse (!) 102   Temp 98.8 F (37.1 C) (Oral)   Ht 5\' 3"  (1.6 m)   Wt 141 lb 12.8 oz (64.3 kg)   BMI 25.12 kg/m  Gen: NAD, alert, cooperative with exam HEENT: NCAT, EOMI, PERRL.  Pharynx is erythematous no exudates noted there is 2+ anterior cervical adenopathy CV: RRR, good S1/S2, no murmur Resp: CTABL, no wheezes, non-labored Ext: No edema, warm Neuro: Alert and oriented, No gross deficits  Assessment and plan:  1. Strep pharyngitis     Meds ordered this encounter  Medications  . amoxicillin-clavulanate (AUGMENTIN) 875-125 MG tablet    Sig: Take 1 tablet by mouth 2 (two) times daily. Take all of this medication    Dispense:  20 tablet    Refill:  0  . betamethasone acetate-betamethasone sodium phosphate (CELESTONE) injection 6 mg    No orders of the defined types were placed in this encounter.   Follow up as needed.  Claretta Fraise, MD

## 2017-07-01 ENCOUNTER — Telehealth: Payer: Self-pay | Admitting: Physician Assistant

## 2018-04-03 ENCOUNTER — Other Ambulatory Visit: Payer: Self-pay | Admitting: Physician Assistant

## 2018-04-03 NOTE — Telephone Encounter (Signed)
Patient aware and will call back to make an appt 

## 2018-04-03 NOTE — Telephone Encounter (Signed)
Last seen 04/25/17  Sheena Torres  Needs to be seen

## 2018-04-16 ENCOUNTER — Encounter: Payer: Self-pay | Admitting: Hematology and Oncology

## 2018-04-21 ENCOUNTER — Telehealth: Payer: Self-pay | Admitting: *Deleted

## 2018-04-21 NOTE — Telephone Encounter (Signed)
Received referral from [place] and scheduled the patient for tomorrow

## 2018-04-22 ENCOUNTER — Other Ambulatory Visit: Payer: Self-pay

## 2018-04-22 ENCOUNTER — Inpatient Hospital Stay: Payer: Self-pay | Attending: Gynecologic Oncology | Admitting: Gynecologic Oncology

## 2018-04-22 ENCOUNTER — Encounter: Payer: Self-pay | Admitting: Oncology

## 2018-04-22 ENCOUNTER — Encounter: Payer: Self-pay | Admitting: Gynecologic Oncology

## 2018-04-22 VITALS — BP 130/78 | HR 72 | Temp 98.4°F | Resp 18 | Ht 62.0 in | Wt 146.0 lb

## 2018-04-22 DIAGNOSIS — C530 Malignant neoplasm of endocervix: Secondary | ICD-10-CM | POA: Insufficient documentation

## 2018-04-22 DIAGNOSIS — G893 Neoplasm related pain (acute) (chronic): Secondary | ICD-10-CM | POA: Insufficient documentation

## 2018-04-22 DIAGNOSIS — R5381 Other malaise: Secondary | ICD-10-CM | POA: Insufficient documentation

## 2018-04-22 DIAGNOSIS — K5909 Other constipation: Secondary | ICD-10-CM | POA: Insufficient documentation

## 2018-04-22 DIAGNOSIS — C539 Malignant neoplasm of cervix uteri, unspecified: Secondary | ICD-10-CM | POA: Insufficient documentation

## 2018-04-22 NOTE — Patient Instructions (Signed)
Plan to have an MRI and PET for further evaluation of the cervix and to evaluate for spread of the cervical cancer and we will see you in the office after to discuss next steps/recommendations.  Please call the office for any questions or concerns.

## 2018-04-22 NOTE — Progress Notes (Signed)
Consult Note: Gyn-Onc  Consult was requested by Dr. Rosario Torres for the evaluation of Sheena Torres 53 y.o. female  CC:  Chief Complaint  Patient presents with  . Cervical Cancer    Assessment/Plan:  Sheena Torres  is a 53 y.o.  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 It is visible in the cone bed and palpably approximately 4cm   We will obtain a PET/CT to evaluate for metastatic disease that would preclude surgery. We will obtain an MRI to determine dimensions on the cancer which will determine if radical hysterectomy vs radiation followed by extrafascial hysterectomy is most appropriate.   I will see her back after the imaging to discuss surgical vs radiation planning.  We discussed the etiology of cervical cancer, the prognosis, and anticipated next steps.  HPI: Ms Sheena Torres is a 53 year old P3 who is seen in consultation at the request of Dr Sheena Torres for clinical stage I cervical cancer.  The patient reports postcoital bleeding since August, 2019 and postmenopausal bleeding since November, 2019.  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  She was taken to the OR on 04/16/18 for a cervical cone biopsy. The specimen revealed a grossly normal cervix on the ectocervix.  Cone specimen revealed squamous cell carcinoma.  When discussing this with the pathologist he reported that in the actual cone specimen itself that was CIN 3 however there was a separate fragment of tissue that included carcinoma.  The dimensions of this was 0.6 cm, however this involved the margins.  A post cone ECC was positive for squamous cell carcinoma, as well as an endometrial curettage which also contain benign endometrial glands.  The patient reports a longstanding history of abnormal Pap smears since her 72s.  She had an abnormal Pap smear again during pregnancy in 2000 that was treated with biopsies but no other intervention.  6 years ago in 2014 she  had an abnormal Pap smear with biopsies that she states everything turned out okay.  She states that she had had normal Pap smears since that time.  Her most recent Pap smear prior to diagnosis was in February 2018 which she says was normal.  The patient has history of 2 prior vaginal deliveries and 1 prior cesarean section.  She has had no other abdominal surgeries.  Her any prior medical condition is migraines.  She is a non-smoker and has never smoked.  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  Current Meds:  Outpatient Encounter Medications as of 04/22/2018  Medication Sig  . amoxicillin-clavulanate (AUGMENTIN) 875-125 MG tablet Take 1 tablet by mouth 2 (two) times daily. Take all of this medication  . naproxen sodium (ALEVE) 220 MG tablet Take 1 tablet (220 mg total) by mouth 2 (two) times daily with a meal.  . SUMAtriptan (IMITREX) 100 MG tablet Take 1 tablet (100 mg total) by mouth once for 1 dose. May repeat in 2 hours if no relief.   No facility-administered encounter medications on file as of 04/22/2018.     Allergy: No Known Allergies  Social Hx:   Social History   Socioeconomic History  . Marital status: Divorced    Spouse name: Not on file  . Number of children: Not on file  . Years of education: Not on file  . Highest education level: Not on file  Occupational History  . Not on file  Social Needs  . Financial resource strain: Not on file  . Food insecurity:    Worry: Not on file    Inability: Not on file  . Transportation needs:    Medical: Not on file    Non-medical: Not on file  Tobacco Use  . Smoking status: Never Smoker  . Smokeless tobacco: Never Used  Substance and Sexual Activity  . Alcohol use: Yes    Comment: occasional   . Drug use: No  . Sexual activity: Not on file  Lifestyle  . Physical activity:    Days per week: Not on file    Minutes per session: Not on file  . Stress: Not on file  Relationships  .  Social connections:    Talks on phone: Not on file    Gets together: Not on file    Attends religious service: Not on file    Active member of club or organization: Not on file    Attends meetings of clubs or organizations: Not on file    Relationship status: Not on file  . Intimate partner violence:    Fear of current or ex partner: Not on file    Emotionally abused: Not on file    Physically abused: Not on file    Forced sexual activity: Not on file  Other Topics Concern  . Not on file  Social History Narrative  . Not on file    Past Surgical Hx: History reviewed. No pertinent surgical history.  Past Medical Hx:  Past Medical History:  Diagnosis Date  . ADD (attention deficit disorder)   . Migraine     Past Gynecological History:  P3 No LMP recorded.  Family Hx:  Family History  Problem Relation Age of Onset  . Heart disease Mother   . Diabetes Father     Review of Systems:  Constitutional  Feels well,   ENT Normal appearing ears and nares bilaterally Skin/Breast  No rash, sores, jaundice, itching, dryness Cardiovascular  No chest pain, shortness of breath, or edema  Pulmonary  No cough or wheeze.  Gastro Intestinal  No nausea, vomitting, or diarrhoea. No bright red blood per rectum, no abdominal pain, change in bowel movement, or constipation.  Genito Urinary  No frequency, urgency, dysuria, + postcoital and postmenopausal bleeding  Musculo Skeletal  No myalgia, arthralgia, joint swelling or pain  Neurologic  No weakness, numbness, change in gait,  Psychology  No depression, anxiety, insomnia.   Vitals:  unknown if currently breastfeeding.  Physical Exam: WD in NAD Neck  Supple NROM, without any enlargements.  Lymph Node Survey No cervical supraclavicular or inguinal adenopathy Cardiovascular  Pulse normal rate, regularity and rhythm. S1 and S2 normal.  Lungs  Clear to auscultation bilateraly, without wheezes/crackles/rhonchi. Good air  movement.  Skin  No rash/lesions/breakdown  Psychiatry  Alert and oriented to person, place, and time  Abdomen  Normoactive bowel sounds, abdomen soft, non-tender and thin without evidence of hernia.  Back No CVA tenderness Genito Urinary  Vulva/vagina: Normal external female genitalia.   No lesions. No discharge or bleeding.  Bladder/urethra:  No lesions or masses, well supported bladder  Vagina: normal  Cervix: Cone bed friable.  Grossly normal residual ectocervix.  On palpation the cervix measures at least 4 cm and is barreled at the endocervix.  Within the base of the cone bed in the awes is visible white tumor predominantly in the anterior endocervix.  It external dimension is at least 1 cm.  There is  no palpable parametrial infiltration.    Uterus:  Small, mobile, no parametrial involvement or nodularity.  Adnexa: no palpable masses. Rectal  Good tone, no masses no cul de sac nodularity. No parametrial extension.  Extremities  No bilateral cyanosis, clubbing or edema.   Thereasa Solo, MD  04/22/2018, 9:14 AM

## 2018-04-24 ENCOUNTER — Other Ambulatory Visit: Payer: Self-pay

## 2018-04-24 ENCOUNTER — Ambulatory Visit (HOSPITAL_COMMUNITY)
Admission: RE | Admit: 2018-04-24 | Discharge: 2018-04-24 | Disposition: A | Discharge status: Home / Self Care | Payer: Self-pay | Source: Ambulatory Visit | Attending: Gynecologic Oncology | Admitting: Gynecologic Oncology

## 2018-04-24 DIAGNOSIS — C530 Malignant neoplasm of endocervix: Secondary | ICD-10-CM | POA: Insufficient documentation

## 2018-04-24 LAB — GLUCOSE, CAPILLARY: Glucose-Capillary: 89 mg/dL (ref 70–99)

## 2018-04-24 MED ORDER — FLUDEOXYGLUCOSE F - 18 (FDG) INJECTION
7.6600 | Freq: Once | INTRAVENOUS | Status: AC | PRN
  Administered 2018-04-24: 7.66 via INTRAVENOUS

## 2018-04-27 ENCOUNTER — Ambulatory Visit (HOSPITAL_COMMUNITY)
Admission: RE | Admit: 2018-04-27 | Discharge: 2018-04-27 | Disposition: A | Discharge status: Home / Self Care | Payer: Self-pay | Source: Ambulatory Visit | Attending: Gynecologic Oncology | Admitting: Gynecologic Oncology

## 2018-04-27 ENCOUNTER — Other Ambulatory Visit: Payer: Self-pay

## 2018-04-27 ENCOUNTER — Encounter: Payer: Self-pay | Admitting: Oncology

## 2018-04-27 ENCOUNTER — Telehealth: Payer: Self-pay | Admitting: Gynecologic Oncology

## 2018-04-27 DIAGNOSIS — C530 Malignant neoplasm of endocervix: Secondary | ICD-10-CM | POA: Insufficient documentation

## 2018-04-27 MED ORDER — GADOBUTROL 1 MMOL/ML IV SOLN
7.5000 mL | Freq: Once | INTRAVENOUS | Status: AC | PRN
  Administered 2018-04-27: 7.5 mL via INTRAVENOUS

## 2018-04-27 NOTE — Telephone Encounter (Signed)
Spoke with patient and advised her that she would not need to come in the office for her appointment on Wednesday and that Dr. Denman George is planning on calling her on the phone to discuss the results and recommendations moving forward. Agreeable with the plan. No concerns voiced. Advised to call for any needs.

## 2018-04-29 ENCOUNTER — Telehealth: Payer: Self-pay | Admitting: Gynecologic Oncology

## 2018-04-29 ENCOUNTER — Telehealth: Payer: Self-pay | Admitting: Oncology

## 2018-04-29 ENCOUNTER — Ambulatory Visit: Payer: Self-pay | Admitting: Gynecologic Oncology

## 2018-04-29 DIAGNOSIS — C530 Malignant neoplasm of endocervix: Secondary | ICD-10-CM

## 2018-04-29 NOTE — Telephone Encounter (Signed)
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  I discussed that surgery is not an option for stage IIb cervical cancers but instead the NCCN guidelines recommend primary chemotherapy and radiation.  We will schedule this for the patient.  I discussed extensively with the patient will chemotherapy and radiation will involve.  We discussed prognosis.  She works in a [employer] and is continuing to go to work at this time even during the recommendation for staying at harm and avoiding contact with others during the early phases of the coronavirus outbreak.  I discussed that mortality from coronavirus is particularly elevated among cancer patients particularly those in active treatment.  I discussed that I recommend that she seek time off of work during active treatment for her cancer.

## 2018-04-29 NOTE — Telephone Encounter (Signed)
Otho Bellows with appointment to see Dr. Alvy Bimler on 05/01/18 at 11:15 and Dr. Sondra Come on 05/14/18 at 2:30.  She verbalized understanding and agreement.

## 2018-04-30 ENCOUNTER — Telehealth: Payer: Self-pay | Admitting: Oncology

## 2018-04-30 NOTE — Telephone Encounter (Signed)
Sheena Torres called and said she has started to have swelling and pressure in her lower belly in the afternoons.  She said her stomach is flat in the morning and becomes more swollen during the day.  Discussed with [name], NP and reassured patient that this may be from recent Cone procedure.    [name] also asked if she would be able to move up the appointment for Dr. Sondra Come.  She would like to start treatment as soon as possible.

## 2018-05-01 ENCOUNTER — Inpatient Hospital Stay (HOSPITAL_BASED_OUTPATIENT_CLINIC_OR_DEPARTMENT_OTHER): Payer: Self-pay | Admitting: Hematology and Oncology

## 2018-05-01 ENCOUNTER — Encounter: Payer: Self-pay | Admitting: Hematology and Oncology

## 2018-05-01 ENCOUNTER — Other Ambulatory Visit: Payer: Self-pay

## 2018-05-01 ENCOUNTER — Other Ambulatory Visit: Payer: Self-pay | Admitting: Hematology and Oncology

## 2018-05-01 VITALS — BP 122/76 | HR 66 | Temp 98.4°F | Resp 18 | Ht 62.0 in | Wt 145.8 lb

## 2018-05-01 DIAGNOSIS — K5909 Other constipation: Secondary | ICD-10-CM

## 2018-05-01 DIAGNOSIS — G893 Neoplasm related pain (acute) (chronic): Secondary | ICD-10-CM

## 2018-05-01 DIAGNOSIS — R5381 Other malaise: Secondary | ICD-10-CM

## 2018-05-01 DIAGNOSIS — C530 Malignant neoplasm of endocervix: Secondary | ICD-10-CM

## 2018-05-01 DIAGNOSIS — Z7189 Other specified counseling: Secondary | ICD-10-CM | POA: Insufficient documentation

## 2018-05-01 NOTE — Assessment & Plan Note (Signed)
She has significant symptoms of pain and cramping likely associated with the cancer I recommend scheduled acetaminophen for the next few days I recommend avoidance of NSAID due to associated risk of bleeding

## 2018-05-01 NOTE — Progress Notes (Signed)
START OFF PATHWAY REGIMEN - Other Dx   [id number]:Cisplatin 40 mg/m2 IV D1 q7 Days + RT:   A cycle is every 7 days:     Cisplatin   **Always confirm dose/schedule in your pharmacy ordering system**  Patient Characteristics: Intent of Therapy: Curative Intent, Discussed with Patient 

## 2018-05-01 NOTE — Progress Notes (Signed)
Patient wanting to apply for financial assistance for anything that could help her. She was given [name]'s card by registration but still wanted to speak with someone.  Reviewed her chart and saw that she is a new patient today. Advised her once her treatment plan has been established, [name] will reach out to her with available options she may apply for. There may also be drug replacement available through [facility] if she has to do chemo. Ailene Ravel and Jodell Cipro are the advocates for Radiation if she has to have services there. Patient very anxious about getting everything taken care of. Gave her an application for [employer] for patients whom live in [place] with their address and contact number for any questions regarding her living between [place] and [place]. She verbalized understanding.  Advised patient [name] will reach out to her once her plan has been established to discuss available resources if chemo is a part of her treatment plan. Will refer to Meredith/Kieara if Radiation is a part of her plan. She verbalized understanding and has [name]'s card for any additional financial questions or concerns.

## 2018-05-01 NOTE — Assessment & Plan Note (Signed)
She is young and has no evidence of comorbidities. The role of treatment is curative.

## 2018-05-01 NOTE — Assessment & Plan Note (Signed)
I have reviewed PET CT scan with the patient We discussed the role of concurrent chemoradiation therapy I recommend port placement, blood work and chemo education class I will see her back next week for further follow-up and discussion about plan of care and chemotherapy consent.

## 2018-05-01 NOTE — Assessment & Plan Note (Signed)
Due to high risk situation and the need for chemotherapy and radiation treatment, I gave her letter to bring to work.  She is unlikely going to be able to work for the next 6 months

## 2018-05-01 NOTE — Progress Notes (Signed)
[facility] NOTE  Patient Care Team: [name] as PCP - General (Physician Assistant)  ASSESSMENT & PLAN:  Malignant neoplasm of endocervix [facility]) I have reviewed PET CT scan with the patient We discussed the role of concurrent chemoradiation therapy I recommend port placement, blood work and chemo education class I will see her back next week for further follow-up and discussion about plan of care and chemotherapy consent.  Cancer associated pain She has significant symptoms of pain and cramping likely associated with the cancer I recommend scheduled acetaminophen for the next few days I recommend avoidance of NSAID due to associated risk of bleeding  Other constipation I recommend discontinuation of oral iron supplement and to take regular laxatives  Physical debility Due to high risk situation and the need for chemotherapy and radiation treatment, I gave her letter to bring to work.  She is unlikely going to be able to work for the next 6 months  Goals of care, counseling/discussion She is young and has no evidence of comorbidities. The role of treatment is curative.   Orders Placed This Encounter  Procedures  . IR IMAGING GUIDED PORT INSERTION    Standing Status:   Future    Standing Expiration Date:   07/01/2019    Order Specific Question:   Reason for Exam (SYMPTOM  OR DIAGNOSIS REQUIRED)    Answer:   need chemo on 4/3    Order Specific Question:   Is the patient pregnant?    Answer:   No    Order Specific Question:   Preferred Imaging Location?    Answer:   [facility]  . CBC with Differential/Platelet    Standing Status:   Standing    Number of Occurrences:   22    Standing Expiration Date:   05/01/2019  . Comprehensive metabolic panel    Standing Status:   Standing    Number of Occurrences:   22    Standing Expiration Date:   05/01/2019  . Magnesium    Standing Status:   Standing    Number of Occurrences:   22     Standing Expiration Date:   05/01/2019  . HIV antibody (with reflex)    Standing Status:   Future    Standing Expiration Date:   05/01/2019     CHIEF COMPLAINTS/PURPOSE OF CONSULTATION:  Locally advanced cervical cancer, for further therapy  HISTORY OF PRESENTING ILLNESS:  Sheena Torres 53 y.o. female is here because of recent diagnosis of squamous cell carcinoma of the cervix Her symptoms began with postcoital bleeding. She has postmenopausal bleeding/discharge.  She underwent further evaluation and was subsequently diagnosed with cervical cancer. I have reviewed her chart and materials related to her cancer extensively and collaborated history with the patient. Summary of oncologic history is as follows:   Malignant neoplasm of endocervix ([place])   09/15/2017 Initial Diagnosis    The patient reports postcoital bleeding since August, 2019 and postmenopausal bleeding since November, 2019. 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     04/16/2018 Surgery    She was taken to the OR on 04/16/18 for a cervical cone biopsy. The specimen revealed a grossly normal cervix on the ectocervix.  Cone specimen revealed squamous cell carcinoma.  When discussing this with the pathologist he reported that in the actual cone specimen itself that was CIN 3 however there  was a separate fragment of tissue that included carcinoma.  The dimensions of this was 0.6 cm, however this involved the margins.  A post cone ECC was positive for squamous cell carcinoma, as well as an endometrial curettage which also contain benign endometrial glands.     04/16/2018 Pathology Results    Endocervix curettage: [facility]    04/22/2018 Initial Diagnosis    Malignant neoplasm of endocervix ([place])    04/24/2018 PET scan    1. There is a large mass involving the cervix and endometrium which has a maximum dimension of 5.3 cm within SUV max of 20.4. 2. Small  bilateral pelvic sidewall lymph nodes measuring less than 1cm with mild nonspecific uptake. The no hypermetabolic adenopathy or evidence of distant metastatic disease. 3. Nonspecific pulmonary nodules measuring less than 5 mm are identified in the right lung. Too small to characterize by PET-CT.     04/27/2018 Imaging    MRI pelvis  1. Uterine cervix 5.5 x 3.4 x 3.2 cm mass compatible with primary cervical malignancy, with mild parametrial invasion. Stage IIB by MRI. 2. Small volume simple free fluid in the pelvic cul-de-sac. 3. No pathologically enlarged pelvic lymph nodes, see comments.    Currently, she complained of sensation of heaviness in the pelvic region with associated cramping pain and discomfort. The pain comes and goes and sometimes could be quite significant. She took naproxen in the past for this. She is not able to work due to her cancer pain. She is divorced.  She lives with her youngest son. She was prescribed oral iron supplement.  She has mild constipation.  MEDICAL HISTORY:  Past Medical History:  Diagnosis Date  . ADD (attention deficit disorder)   . Cancer ([place])   . Migraine     SURGICAL HISTORY: Past Surgical History:  Procedure Laterality Date  . BREAST ENHANCEMENT SURGERY    . CESAREAN SECTION      SOCIAL HISTORY: Social History   Socioeconomic History  . Marital status: Divorced    Spouse name: Not on file  . Number of children: 3  . Years of education: Not on file  . Highest education level: Not on file  Occupational History  . Occupation: Leisure centre manager  Social Needs  . Financial resource strain: Not on file  . Food insecurity:    Worry: Not on file    Inability: Not on file  . Transportation needs:    Medical: Not on file    Non-medical: Not on file  Tobacco Use  . Smoking status: Never Smoker  . Smokeless tobacco: Never Used  Substance and Sexual Activity  . Alcohol use: Yes    Comment: occasional   . Drug use: No  . Sexual  activity: Not on file  Lifestyle  . Physical activity:    Days per week: Not on file    Minutes per session: Not on file  . Stress: Not on file  Relationships  . Social connections:    Talks on phone: Not on file    Gets together: Not on file    Attends religious service: Not on file    Active member of club or organization: Not on file    Attends meetings of clubs or organizations: Not on file    Relationship status: Not on file  . Intimate partner violence:    Fear of current or ex partner: Not on file    Emotionally abused: Not on file    Physically abused: Not on file  Forced sexual activity: Not on file  Other Topics Concern  . Not on file  Social History Narrative  . Not on file    FAMILY HISTORY: Family History  Problem Relation Age of Onset  . Heart disease Mother   . Diabetes Father   . Heart disease Father   . Cancer Maternal Grandmother        mouth/jaw cancer    ALLERGIES:  has No Known Allergies.  MEDICATIONS:  Current Outpatient Medications  Medication Sig Dispense Refill  . naproxen sodium (ALEVE) 220 MG tablet Take 1 tablet (220 mg total) by mouth 2 (two) times daily with a meal. 40 tablet 5  . SUMAtriptan (IMITREX) 100 MG tablet Take 1 tablet (100 mg total) by mouth once for 1 dose. May repeat in 2 hours if no relief. 10 tablet 11   No current facility-administered medications for this visit.     REVIEW OF SYSTEMS:   Constitutional: Denies fevers, chills or abnormal night sweats Eyes: Denies blurriness of vision, double vision or watery eyes Ears, nose, mouth, throat, and face: Denies mucositis or sore throat Respiratory: Denies cough, dyspnea or wheezes Cardiovascular: Denies palpitation, chest discomfort or lower extremity swelling Skin: Denies abnormal skin rashes Lymphatics: Denies new lymphadenopathy or easy bruising Neurological:Denies numbness, tingling or new weaknesses Behavioral/Psych: Mood is stable, no new changes  All other  systems were reviewed with the patient and are negative.  PHYSICAL EXAMINATION: ECOG PERFORMANCE STATUS: 1 - Symptomatic but completely ambulatory  Vitals:   05/01/18 1109  BP: 122/76  Pulse: 66  Resp: 18  Temp: 98.4 F (36.9 C)  SpO2: 100%   Filed Weights   05/01/18 1109  Weight: 145 lb 12.8 oz (66.1 kg)    GENERAL:alert, no distress and comfortable SKIN: skin color, texture, turgor are normal, no rashes or significant lesions EYES: normal, conjunctiva are pink and non-injected, sclera clear OROPHARYNX:no exudate, no erythema and lips, buccal mucosa, and tongue normal  NECK: supple, thyroid normal size, non-tender, without nodularity LYMPH:  no palpable lymphadenopathy in the cervical, axillary or inguinal LUNGS: clear to auscultation and percussion with normal breathing effort HEART: regular rate & rhythm and no murmurs and no lower extremity edema ABDOMEN:abdomen soft, non-tender and normal bowel sounds Musculoskeletal:no cyanosis of digits and no clubbing  PSYCH: alert & oriented x 3 with fluent speech NEURO: no focal motor/sensory deficits  LABORATORY DATA:  I have reviewed copy of her biopsy report  RADIOGRAPHIC STUDIES: I have reviewed imaging studies with the patient I have personally reviewed the radiological images as listed and agreed with the findings in the report. Mr Pelvis W Wo Contrast  Result Date: 04/27/2018 CLINICAL DATA:  Clinical stage IB cervical cancer. Pelvic staging evaluation. EXAM: MRI PELVIS WITHOUT AND WITH CONTRAST TECHNIQUE: Multiplanar multisequence MR imaging of the pelvis was performed both before and after administration of intravenous contrast. CONTRAST:  7.5 cc Gadavist IV. COMPARISON:  04/24/2018 PET-CT. FINDINGS: Urinary Tract:  Normal bladder.  Normal urethra. Bowel: Visualized small and large bowel are normal caliber with no bowel wall thickening. Vascular/Lymphatic: No pathologically enlarged lymph nodes in the pelvis. Bilateral pelvic  sidewall (external iliac) rounded subcentimeter lymph nodes measuring 0.6 cm on the right (series 17/image 8) and 0.6 cm on the left (series 17/image 10), unchanged from 04/24/2018 PET-CT. Reproductive: Uterus: The anteverted uterus measures 8.9 x 4.8 x 5.2 cm. No uterine fibroids. Inner myometrium (junctional zone) measures 12 mm in thickness, which is compatible with mild diffuse adenomyosis. Endometrium measures  5 mm in bilayer thickness. There is a T2 hyperintense 5.5 x 3.4 x 3.2 cm mass in the uterine cervix (series 5/image 16). This mass invades the lower uterine body. No clear invasion of the vagina by the mass. There is loss of the normal T2 hypointense outer cervical stroma in multiple locations, including along the posterior upper left cervix (series 4/image 19 and series 5/image 17) and lateral lower cervix (series 4/images 21 and 23), compatible with mild parametrial invasion. No direct invasion of colon or bladder. No extension to the pelvic sidewall. No hydronephrosis on limited views of the kidneys. Ovaries and Adnexa: The right ovary measures 2.3 x 1.4 x 1.0 cm and is normal. The left ovary measures 2.7 x 1.1 x 1.4 cm and is normal. There are no suspicious ovarian or adnexal masses. Other: Small volume simple free fluid in the pelvic cul-de-sac. No focal pelvic fluid collection. Musculoskeletal: No aggressive appearing focal osseous lesions. Moderate degenerative disc disease at L4-5. IMPRESSION: 1. Uterine cervix 5.5 x 3.4 x 3.2 cm mass compatible with primary cervical malignancy, with mild parametrial invasion. Stage IIB by MRI. 2. Small volume simple free fluid in the pelvic cul-de-sac. 3. No pathologically enlarged pelvic lymph nodes, see comments. Electronically Signed   By: Ilona Sorrel M.D.   On: 04/27/2018 13:05   Nm Pet Image Initial (pi) Skull Base To Thigh  Result Date: 04/24/2018 CLINICAL DATA:  Initial treatment strategy for cervical cancer. EXAM: NUCLEAR MEDICINE PET SKULL BASE TO  THIGH TECHNIQUE: 7.66 mCi F-18 FDG was injected intravenously. Full-ring PET imaging was performed from the skull base to thigh after the radiotracer. CT data was obtained and used for attenuation correction and anatomic localization. Fasting blood glucose: 89 mg/dl COMPARISON:  None FINDINGS: Mediastinal blood pool activity: SUV max 3.04. NECK: No hypermetabolic lymph nodes in the neck. Incidental CT findings: none CHEST: No hypermetabolic axillary or supraclavicular adenopathy. No hypermetabolic mediastinal or hilar adenopathy identified. Tiny noncalcified solid nodule in the right upper lobe is too small to characterize measuring 2 mm, image 25/8. Within the right middle lobe there is a small round dense nodule measuring 3 mm. Also too small to characterize. Incidental CT findings: None ABDOMEN/PELVIS: No abnormal radiotracer activity within the liver. No abnormal uptake within the spleen, pancreas, adrenal glands. No hypermetabolic abdominal adenopathy. No hypermetabolic pelvic or inguinal lymph nodes. 7 mm right external iliac lymph node is identified, image 159/4. There is mild uptake within this node with SUV max of 2.5. Left pelvic sidewall lymph node measures 6 mm and has an SUV max of 2.19. Hypermetabolic mass involving the cervix and uterine cavity is identified this measures 4.7 by 3.6 by 5.3 cm and has an SUV max of 20.4. Incidental CT findings: none SKELETON: No focal hypermetabolic activity to suggest skeletal metastasis. Incidental CT findings: none IMPRESSION: 1. There is a large mass involving the cervix and endometrium which has a maximum dimension of 5.3 cm within SUV max of 20.4. 2. Small bilateral pelvic sidewall lymph nodes measuring less than 1 cm with mild nonspecific uptake. The no hypermetabolic adenopathy or evidence of distant metastatic disease. 3. Nonspecific pulmonary nodules measuring less than 5 mm are identified in the right lung. Too small to characterize by PET-CT. Electronically  Signed   By: Kerby Moors M.D.   On: 04/24/2018 16:08    I spent 60 minutes counseling the patient face to face. The total time spent in the appointment was 80 minutes and more than 50% was  on counseling.  All questions were answered. The patient knows to call the clinic with any problems, questions or concerns.  Heath Lark, MD 05/01/2018 4:48 PM

## 2018-05-01 NOTE — Assessment & Plan Note (Signed)
I recommend discontinuation of oral iron supplement and to take regular laxatives

## 2018-05-04 NOTE — Progress Notes (Signed)
GYN Location of Tumor / Histology: Malignant neoplasm of endocervix ([place])  Sheena Torres presented with symptoms of: The patient reports postcoital bleeding since August, 2019 and postmenopausal bleeding since November, 2019.  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  She was taken to the OR on 04/16/18 for a cervical cone biopsy. The specimen revealed a grossly normal cervix on the ectocervix.  Cone specimen revealed squamous cell carcinoma.  When discussing this with the pathologist he reported that in the actual cone specimen itself that was CIN 3 however there was a separate fragment of tissue that included carcinoma.  The dimensions of this was 0.6 cm, however this involved the margins.  A post cone ECC was positive for squamous cell carcinoma, as well as an endometrial curettage which also contain benign endometrial glands.  Biopsies revealed:   Past/Anticipated interventions by Gyn/Onc surgery, if any: Pt is followed by Dr. Denman George  Past/Anticipated interventions by medical oncology, if any: Per Dr. Alvy Bimler 05/01/18:  ASSESSMENT & PLAN:  Malignant neoplasm of endocervix ([place]) I have reviewed PET CT scan with the patient We discussed the role of concurrent chemoradiation therapy I recommend port placement, blood work and chemo education class I will see her back next week for further follow-up and discussion about plan of care and chemotherapy consent.  Cancer associated pain She has significant symptoms of pain and cramping likely associated with the cancer I recommend scheduled acetaminophen for the next few days I recommend avoidance of NSAID due to associated risk of bleeding  Other constipation I recommend discontinuation of oral iron supplement and to take regular laxatives  Physical debility Due to high risk situation and the need for chemotherapy and radiation treatment, I gave her letter to bring to work.  She is unlikely going to be able to  work for the next 6 months  Goals of care, counseling/discussion She is young and has no evidence of comorbidities. The role of treatment is curative.  Weight changes, if any: gained about 15 pounds.  Bowel/Bladder complaints, if any: Gastro Intestinal  No nausea, vomitting, or diarrhoea. No bright red blood per rectum, no abdominal pain, change in bowel movement, or constipation.  Genito Urinary  No frequency, urgency, dysuria, + postcoital and postmenopausal bleeding    Pain issues, if any:  Pt reports cramping pain in LEFT pelvic area with bloating. Rated 5/10.   SAFETY ISSUES:  Prior radiation? No  Pacemaker/ICD? No  Possible current pregnancy? No  Is the patient on methotrexate? No  Current Complaints / other details:  Pt presents today for initial consult with Dr. Sondra Come for Radiation Oncology. Pt is unaccompanied.  BP 118/87 (BP Location: Left Arm, Patient Position: Sitting)   Pulse 71   Temp 98.5 F (36.9 C) (Oral)   Resp 18   Ht 5' 2.5" (1.588 m)   Wt 148 lb 4 oz (67.2 kg)   LMP  (LMP Unknown)   SpO2 100%   BMI 26.68 kg/m   Wt Readings from Last 3 Encounters:  05/06/18 148 lb 4 oz (67.2 kg)  05/01/18 145 lb 12.8 oz (66.1 kg)  04/22/18 146 lb (66.2 kg)   Loma Sousa, RN BSN

## 2018-05-05 ENCOUNTER — Telehealth: Payer: Self-pay

## 2018-05-05 NOTE — Telephone Encounter (Signed)
Contacted pt to remind her of current visitor restrictions due to  Covid 19. Pt is agreeable to presenting to consult without visitors. Loma Sousa, RN BSN

## 2018-05-06 ENCOUNTER — Encounter: Payer: Self-pay | Admitting: Radiation Oncology

## 2018-05-06 ENCOUNTER — Other Ambulatory Visit: Payer: Self-pay | Admitting: Radiology

## 2018-05-06 ENCOUNTER — Ambulatory Visit
Admission: RE | Admit: 2018-05-06 | Discharge: 2018-05-06 | Disposition: A | Discharge status: Home / Self Care | Payer: Self-pay | Source: Ambulatory Visit | Attending: Radiation Oncology | Admitting: Radiation Oncology

## 2018-05-06 ENCOUNTER — Other Ambulatory Visit: Payer: Self-pay

## 2018-05-06 VITALS — BP 118/87 | HR 71 | Temp 98.5°F | Resp 18 | Ht 62.5 in | Wt 148.2 lb

## 2018-05-06 DIAGNOSIS — Z809 Family history of malignant neoplasm, unspecified: Secondary | ICD-10-CM | POA: Insufficient documentation

## 2018-05-06 DIAGNOSIS — C530 Malignant neoplasm of endocervix: Secondary | ICD-10-CM

## 2018-05-06 DIAGNOSIS — C539 Malignant neoplasm of cervix uteri, unspecified: Secondary | ICD-10-CM | POA: Insufficient documentation

## 2018-05-06 DIAGNOSIS — M5136 Other intervertebral disc degeneration, lumbar region: Secondary | ICD-10-CM | POA: Insufficient documentation

## 2018-05-06 DIAGNOSIS — R918 Other nonspecific abnormal finding of lung field: Secondary | ICD-10-CM | POA: Insufficient documentation

## 2018-05-06 NOTE — Progress Notes (Signed)
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 female who is presenting to the office today for evaluation of cervical cancer.  Per Dr. Alvy Bimler, the patient's symptoms began in August 2019 with post-coital bleeding. Postmenopausal bleeding began in November 2019. She was seen by Dr. 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 She met with Dr. Denman George 04/22/18 who wrote that the patient reported a longstanding history of abnormal Pap smears since her 20s.  She had an abnormal Pap smear again during pregnancy in 2000 that was treated with biopsies but no other intervention.  6 years ago in 2014 she had an abnormal Pap smear with biopsies that she states everything turned out okay.  She states that she had had normal Pap smears since that time.  Her most recent Pap smear prior to diagnosis was in February 2018 which she says was normal. She consulted with Dr. Alvy Bimler on 05/01/18 after learning post-PET diagnosis that she doesn't qualify for surgical intervention. She recommended port placement and a 6 month work-leave at this time.   She was taken to the OR on 04/16/18 for a cervical cone biopsy. The specimen revealed a grossly normal cervix on the ectocervix.Cone specimen revealed squamous cell carcinoma. When discussing this with the pathologist he reported that in the actual cone specimen itself that was CIN 3 however there was a separate fragment of tissue that included carcinoma. The dimensions of this was 0.6 cm, however this involved the margins. A post cone ECC was positive for squamous cell carcinoma, as well as an endometrial curettage which also contain  benign endometrial glands.  She underwent a PET scan on 04/24/18 which revealed a large mass involving the cervix and endometrium which has a maximum dimension of 5.3 cm within SUV max of 20.4. Small bilateral pelvic sidewall lymph nodes measuring less than 1 cm with mild nonspecific uptake. The no hypermetabolic adenopathy or evidence of distant metastatic disease. Nonspecific pulmonary nodules measuring less than 5 mm are identified in the right lung. Too small to characterize by PET-CT.  A pelvic MRI with and without contrast followed on 04/27/18 which showed the cervical 5.5 x 3.4 x 3.2 cm mass compatible with primary cervical malignancy, with mild parametrial invasion. Stage IIB by MRI. Small volume simple free fluid in the pelvic cul-de-sac. No pathologically enlarged pelvic lymph nodes were noted.   she reports associated cramping pain in the left lower pelvis, keeping her awake often. She has consistent vaginal bleeding and is wearing pads for this issue she denies any other symptoms.    PREVIOUS RADIATION THERAPY: No  PAST MEDICAL HISTORY:  has a past medical history of ADD (attention deficit disorder), Cancer ([place]), and Migraine.    PAST SURGICAL HISTORY: Past Surgical History:  Procedure Laterality Date   BREAST ENHANCEMENT SURGERY     CESAREAN SECTION      FAMILY HISTORY: family history includes Cancer in her maternal grandmother; Diabetes in her father; Heart disease in her father and mother.  SOCIAL HISTORY:  reports that she has never smoked. She has never used smokeless tobacco. She reports current alcohol use. She reports that she does not use  drugs.  ALLERGIES: Patient has no known allergies.  MEDICATIONS:  Current Outpatient Medications  Medication Sig Dispense Refill   acetaminophen (TYLENOL) 500 MG tablet Take 1,000 mg by mouth every 8 (eight) hours as needed.     SUMAtriptan (IMITREX) 100 MG tablet Take 1 tablet (100 mg total) by mouth once for 1 dose. May  repeat in 2 hours if no relief. 10 tablet 11   naproxen sodium (ALEVE) 220 MG tablet Take 1 tablet (220 mg total) by mouth 2 (two) times daily with a meal. (Patient not taking: Reported on 05/06/2018) 40 tablet 5   No current facility-administered medications for this encounter.     REVIEW OF SYSTEMS:  A 10+ POINT REVIEW OF SYSTEMS WAS OBTAINED including neurology, dermatology, psychiatry, cardiac, respiratory, lymph, extremities, GI, GU, musculoskeletal, constitutional, reproductive, HEENT. All pertinent positives are noted in the HPI. All others are negative.    PHYSICAL EXAM:  height is 5' 2.5" (1.588 m) and weight is 148 lb 4 oz (67.2 kg). Her oral temperature is 98.5 F (36.9 C). Her blood pressure is 118/87 and her pulse is 71. Her respiration is 18 and oxygen saturation is 100%.   General: Alert and oriented, in no acute distress HEENT: Head is normocephalic. Extraocular movements are intact. Oropharynx is clear. Neck: Neck is supple, no palpable cervical or supraclavicular lymphadenopathy. Heart: Regular in rate and rhythm with no murmurs, rubs, or gallops. Chest: Clear to auscultation bilaterally, with no rhonchi, wheezes, or rales. Abdomen: Soft, nontender, nondistended, with no rigidity or guarding. Extremities: No cyanosis or edema. Lymphatics: see Neck Exam Skin: No concerning lesions. Musculoskeletal: symmetric strength and muscle tone throughout. Neurologic: Cranial nerves II through XII are grossly intact. No obvious focalities. Speech is fluent. Coordination is intact. Psychiatric: Judgment and insight are intact. Affect is appropriate. Pelvic exam deferred until simulation and planning day.    ECOG = 1  0 - Asymptomatic (Fully active, able to carry on all predisease activities without restriction)  1 - Symptomatic but completely ambulatory (Restricted in physically strenuous activity but ambulatory and able to carry out work of a light or sedentary nature. For example,  light housework, office work)  2 - Symptomatic, <50% in bed during the day (Ambulatory and capable of all self care but unable to carry out any work activities. Up and about more than 50% of waking hours)  3 - Symptomatic, >50% in bed, but not bedbound (Capable of only limited self-care, confined to bed or chair 50% or more of waking hours)  4 - Bedbound (Completely disabled. Cannot carry on any self-care. Totally confined to bed or chair)  5 - Death   Eustace Pen MM, Creech RH, Tormey DC, et al. [phone]). "Toxicity and response criteria of the [facility]". [place] Oncol. 5 (6): 649-55  LABORATORY DATA:  No results found for: WBC, HGB, HCT, MCV, PLT, NEUTROABS No results found for: NA, K, CL, CO2, GLUCOSE, CREATININE, CALCIUM    RADIOGRAPHY: Mr Pelvis W Wo Contrast  Result Date: 04/27/2018 CLINICAL DATA:  Clinical stage IB cervical cancer. Pelvic staging evaluation. EXAM: MRI PELVIS WITHOUT AND WITH CONTRAST TECHNIQUE: Multiplanar multisequence MR imaging of the pelvis was performed both before and after administration of intravenous contrast. CONTRAST:  7.5 cc Gadavist IV. COMPARISON:  04/24/2018 PET-CT. FINDINGS: Urinary Tract:  Normal bladder.  Normal urethra. Bowel: Visualized small and large bowel are normal caliber with no bowel wall thickening. Vascular/Lymphatic: No pathologically enlarged lymph nodes in the pelvis. Bilateral pelvic  sidewall (external iliac) rounded subcentimeter lymph nodes measuring 0.6 cm on the right (series 17/image 8) and 0.6 cm on the left (series 17/image 10), unchanged from 04/24/2018 PET-CT. Reproductive: Uterus: The anteverted uterus measures 8.9 x 4.8 x 5.2 cm. No uterine fibroids. Inner myometrium (junctional zone) measures 12 mm in thickness, which is compatible with mild diffuse adenomyosis. Endometrium measures 5 mm in bilayer thickness. There is a T2 hyperintense 5.5 x 3.4 x 3.2 cm mass in the uterine cervix (series 5/image 16). This  mass invades the lower uterine body. No clear invasion of the vagina by the mass. There is loss of the normal T2 hypointense outer cervical stroma in multiple locations, including along the posterior upper left cervix (series 4/image 19 and series 5/image 17) and lateral lower cervix (series 4/images 21 and 23), compatible with mild parametrial invasion. No direct invasion of colon or bladder. No extension to the pelvic sidewall. No hydronephrosis on limited views of the kidneys. Ovaries and Adnexa: The right ovary measures 2.3 x 1.4 x 1.0 cm and is normal. The left ovary measures 2.7 x 1.1 x 1.4 cm and is normal. There are no suspicious ovarian or adnexal masses. Other: Small volume simple free fluid in the pelvic cul-de-sac. No focal pelvic fluid collection. Musculoskeletal: No aggressive appearing focal osseous lesions. Moderate degenerative disc disease at L4-5. IMPRESSION: 1. Uterine cervix 5.5 x 3.4 x 3.2 cm mass compatible with primary cervical malignancy, with mild parametrial invasion. Stage IIB by MRI. 2. Small volume simple free fluid in the pelvic cul-de-sac. 3. No pathologically enlarged pelvic lymph nodes, see comments. Electronically Signed   By: Ilona Sorrel M.D.   On: 04/27/2018 13:05   Nm Pet Image Initial (pi) Skull Base To Thigh  Result Date: 04/24/2018 CLINICAL DATA:  Initial treatment strategy for cervical cancer. EXAM: NUCLEAR MEDICINE PET SKULL BASE TO THIGH TECHNIQUE: 7.66 mCi F-18 FDG was injected intravenously. Full-ring PET imaging was performed from the skull base to thigh after the radiotracer. CT data was obtained and used for attenuation correction and anatomic localization. Fasting blood glucose: 89 mg/dl COMPARISON:  None FINDINGS: Mediastinal blood pool activity: SUV max 3.04. NECK: No hypermetabolic lymph nodes in the neck. Incidental CT findings: none CHEST: No hypermetabolic axillary or supraclavicular adenopathy. No hypermetabolic mediastinal or hilar adenopathy identified.  Tiny noncalcified solid nodule in the right upper lobe is too small to characterize measuring 2 mm, image 25/8. Within the right middle lobe there is a small round dense nodule measuring 3 mm. Also too small to characterize. Incidental CT findings: None ABDOMEN/PELVIS: No abnormal radiotracer activity within the liver. No abnormal uptake within the spleen, pancreas, adrenal glands. No hypermetabolic abdominal adenopathy. No hypermetabolic pelvic or inguinal lymph nodes. 7 mm right external iliac lymph node is identified, image 159/4. There is mild uptake within this node with SUV max of 2.5. Left pelvic sidewall lymph node measures 6 mm and has an SUV max of 2.19. Hypermetabolic mass involving the cervix and uterine cavity is identified this measures 4.7 by 3.6 by 5.3 cm and has an SUV max of 20.4. Incidental CT findings: none SKELETON: No focal hypermetabolic activity to suggest skeletal metastasis. Incidental CT findings: none IMPRESSION: 1. There is a large mass involving the cervix and endometrium which has a maximum dimension of 5.3 cm within SUV max of 20.4. 2. Small bilateral pelvic sidewall lymph nodes measuring less than 1 cm with mild nonspecific uptake. The no hypermetabolic adenopathy or evidence of distant metastatic disease. 3.  Nonspecific pulmonary nodules measuring less than 5 mm are identified in the right lung. Too small to characterize by PET-CT. Electronically Signed   By: Kerby Moors M.D.   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 She therefore would not be a candidate for definitive surgery (radical hysterectomy). She would be a good candidate for a definitive course of radiation therapy, along with radiosensitizing chemotherapy. Radiation treatments would include 6 weeks of external beam, followed by 5  intracavitary high-dose rate brachytherapy treatments.  Today, I talked to the patient about the findings and work-up thus far.  We discussed the natural history of cervical cancer and general treatment, highlighting the role of radiotherapy in the management.  We discussed the available radiation techniques, and focused on the details of logistics and delivery.  We reviewed the anticipated acute and late sequelae associated with radiation in this setting.  The patient was encouraged to ask questions that I answered to the best of my ability.  A patient consent form was discussed and signed.  We retained a copy for our records.  The patient would like to proceed with radiation and will be scheduled for CT simulation.   PLAN: She will return Friday, March 27th for CT simulation. She will have a port-a-cath placed the same day in preparation for chemotherapy.  Treatments to begin late next week or April 6th concomitant with her first cycle of radiosensitizing chemotherapy.     ------------------------------------------------  Blair Promise, PhD, MD      This document serves as a record of services personally performed by Gery Pray, MD. It was created on his behalf by Mary-Margaret Loma Messing, a trained medical scribe. The creation of this record is based on the scribe's personal observations and the provider's statements to them. This document has been checked and approved by the attending provider.

## 2018-05-06 NOTE — Patient Instructions (Signed)
Coronavirus (COVID-19) Are you at risk?  Are you at risk for the Coronavirus (COVID-19)?  To be considered HIGH RISK for Coronavirus (COVID-19), you have to meet the following criteria:  . Traveled to [place], [place], [place], [place] or [place]; or in the [place] to [place], [place], [place], or [place]; and have fever, cough, and shortness of breath within the last 2 weeks of travel OR . Been in close contact with a person diagnosed with COVID-19 within the last 2 weeks and have fever, cough, and shortness of breath . IF YOU DO NOT MEET THESE CRITERIA, YOU ARE CONSIDERED LOW RISK FOR COVID-19.  What to do if you are HIGH RISK for COVID-19?  . If you are having a medical emergency, call 911. . Seek medical care right away. Before you go to a doctor's office, urgent care or emergency department, call ahead and tell them about your recent travel, contact with someone diagnosed with COVID-19, and your symptoms. You should receive instructions from your physician's office regarding next steps of care.  . When you arrive at healthcare provider, tell the healthcare staff immediately you have returned from visiting [place], [place], [place], [place] or [place]; or traveled in the [place] to [place], [place], [place], or [place]; in the last two weeks or you have been in close contact with a person diagnosed with COVID-19 in the last 2 weeks.   . Tell the health care staff about your symptoms: fever, cough and shortness of breath. . After you have been seen by a medical provider, you will be either: o Tested for (COVID-19) and discharged home on quarantine except to seek medical care if symptoms worsen, and asked to  - Stay home and avoid contact with others until you get your results (4-5 days)  - Avoid travel on public transportation if possible (such as bus, train, or airplane) or o Sent to the Emergency Department by EMS for evaluation, COVID-19 testing, and possible  admission depending on your condition and test results.  What to do if you are LOW RISK for COVID-19?  Reduce your risk of any infection by using the same precautions used for avoiding the common cold or flu:  . Wash your hands often with soap and warm water for at least 20 seconds.  If soap and water are not readily available, use an alcohol-based hand sanitizer with at least 60% alcohol.  . If coughing or sneezing, cover your mouth and nose by coughing or sneezing into the elbow areas of your shirt or coat, into a tissue or into your sleeve (not your hands). . Avoid shaking hands with others and consider head nods or verbal greetings only. . Avoid touching your eyes, nose, or mouth with unwashed hands.  . Avoid close contact with people who are sick. . Avoid places or events with large numbers of people in one location, like concerts or sporting events. . Carefully consider travel plans you have or are making. . If you are planning any travel outside or inside the [place], visit the CDC's Travelers' Health webpage for the latest health notices. . If you have some symptoms but not all symptoms, continue to monitor at home and seek medical attention if your symptoms worsen. . If you are having a medical emergency, call 911.   ADDITIONAL HEALTHCARE OPTIONS FOR PATIENTS  [facility] / e-Visit: [email or web address]         [facility]: [phone]  [facility]: [phone]                   [facility]: [phone]   

## 2018-05-07 ENCOUNTER — Encounter: Payer: Self-pay | Admitting: Hematology and Oncology

## 2018-05-07 ENCOUNTER — Inpatient Hospital Stay: Payer: Self-pay

## 2018-05-07 ENCOUNTER — Telehealth: Payer: Self-pay | Admitting: Oncology

## 2018-05-07 ENCOUNTER — Inpatient Hospital Stay (HOSPITAL_BASED_OUTPATIENT_CLINIC_OR_DEPARTMENT_OTHER): Payer: Self-pay | Admitting: Hematology and Oncology

## 2018-05-07 ENCOUNTER — Other Ambulatory Visit: Payer: Self-pay

## 2018-05-07 VITALS — BP 129/98 | HR 64 | Temp 97.6°F | Resp 18 | Ht 62.5 in | Wt 147.6 lb

## 2018-05-07 DIAGNOSIS — C530 Malignant neoplasm of endocervix: Secondary | ICD-10-CM

## 2018-05-07 DIAGNOSIS — K5909 Other constipation: Secondary | ICD-10-CM

## 2018-05-07 DIAGNOSIS — R5381 Other malaise: Secondary | ICD-10-CM

## 2018-05-07 DIAGNOSIS — G893 Neoplasm related pain (acute) (chronic): Secondary | ICD-10-CM

## 2018-05-07 DIAGNOSIS — Z7189 Other specified counseling: Secondary | ICD-10-CM

## 2018-05-07 MED ORDER — PROCHLORPERAZINE MALEATE 10 MG PO TABS: 10.0000 mg | ORAL_TABLET | Freq: Four times a day (QID) | ORAL | 1 refills | Status: DC | PRN

## 2018-05-07 MED ORDER — ONDANSETRON HCL 8 MG PO TABS: 8.0000 mg | ORAL_TABLET | Freq: Three times a day (TID) | ORAL | 1 refills | Status: DC | PRN

## 2018-05-07 MED ORDER — LIDOCAINE-PRILOCAINE 2.5-2.5 % EX CREA: TOPICAL_CREAM | CUTANEOUS | 3 refills | Status: DC

## 2018-05-07 MED ORDER — MORPHINE SULFATE 15 MG PO TABS: 15.0000 mg | ORAL_TABLET | Freq: Four times a day (QID) | ORAL | 0 refills | Status: DC | PRN

## 2018-05-07 NOTE — Assessment & Plan Note (Signed)
We discussed the importance of aggressive laxative therapy 

## 2018-05-07 NOTE — Progress Notes (Signed)
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 The intent is of curative intent.  We discussed some of the risks, benefits, side-effects of cisplatin and its role as chemo sensitizing agent. The plan for weekly cisplatin for x 5-6 doses along with radiation treatment.  Some of the short term side-effects included, though not limited to, including weight loss, life threatening infections, risk of allergic reactions, need for transfusions of blood products, nausea, vomiting, change in bowel habits, loss of hair, admission to hospital for various reasons, and risks of death.   Long term side-effects are also discussed including risks of infertility, permanent damage to nerve function, hearing loss, chronic fatigue, kidney damage with possibility needing hemodialysis, and rare secondary malignancy including bone marrow disorders.  The patient is aware that the response rates discussed earlier is not guaranteed.  After a long discussion, patient made an informed decision to proceed with the prescribed plan of care.   Patient education material was dispensed.     Cancer associated pain She continues to have poorly controlled cancer pain I recommend morphine sulfate along with Tylenol as needed I warned her about risk of sedation, nausea and constipation I will assess for pain control next week  Other constipation We discussed the importance of aggressive laxative therapy  Physical debility I advised her not to work during treatment.  Goals of care, counseling/discussion She is young and has no evidence of comorbidities. The role of treatment is curative.   No orders of the defined types were placed in this encounter.   INTERVAL HISTORY: Please see below for problem oriented charting. She returns for  chemotherapy consent She continues to have some constipation She has rare nausea Despite taking regular Tylenol, she has occasional pelvic pain  SUMMARY OF ONCOLOGIC HISTORY:   Malignant neoplasm of endocervix ([place])   09/15/2017 Initial Diagnosis    The patient reports postcoital bleeding since August, 2019 and postmenopausal bleeding since November, 2019. 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     04/16/2018 Surgery    She was taken to the OR on 04/16/18 for a cervical cone biopsy. The specimen revealed a grossly normal cervix on the ectocervix.  Cone specimen revealed squamous cell carcinoma.  When discussing this with the pathologist he reported that in the actual cone specimen itself that was CIN 3 however there was a separate fragment of tissue that included carcinoma.  The dimensions of this was 0.6 cm, however this involved the margins.  A post cone ECC was positive for squamous cell carcinoma, as well as an endometrial curettage which also contain benign endometrial glands.     04/16/2018 Pathology Results    Endocervix curettage: [facility]    04/22/2018 Initial Diagnosis    Malignant neoplasm of endocervix ([place])    04/24/2018 PET scan    1. There is a large mass involving the cervix and endometrium which has a maximum dimension of 5.3 cm within SUV max of 20.4. 2. Small bilateral pelvic sidewall lymph nodes measuring less than 1cm with mild nonspecific uptake. The no hypermetabolic adenopathy or evidence of distant metastatic disease. 3. Nonspecific pulmonary nodules measuring less than 5 mm are identified in the right lung. Too small to characterize by PET-CT.  04/27/2018 Imaging    MRI pelvis  1. Uterine cervix 5.5 x 3.4 x 3.2 cm mass compatible with primary cervical malignancy, with mild parametrial invasion. Stage IIB by MRI. 2. Small volume simple free fluid in the pelvic cul-de-sac. 3. No  pathologically enlarged pelvic lymph nodes, see comments.     REVIEW OF SYSTEMS:   Constitutional: Denies fevers, chills or abnormal weight loss Eyes: Denies blurriness of vision Ears, nose, mouth, throat, and face: Denies mucositis or sore throat Respiratory: Denies cough, dyspnea or wheezes Cardiovascular: Denies palpitation, chest discomfort or lower extremity swelling Gastrointestinal:  Denies nausea, heartburn or change in bowel habits Skin: Denies abnormal skin rashes Lymphatics: Denies new lymphadenopathy or easy bruising Neurological:Denies numbness, tingling or new weaknesses Behavioral/Psych: Mood is stable, no new changes  All other systems were reviewed with the patient and are negative.  I have reviewed the past medical history, past surgical history, social history and family history with the patient and they are unchanged from previous note.  ALLERGIES:  has No Known Allergies.  MEDICATIONS:  Current Outpatient Medications  Medication Sig Dispense Refill  . polyethylene glycol (MIRALAX / GLYCOLAX) packet Take 17 g by mouth daily.    Marland Kitchen acetaminophen (TYLENOL) 500 MG tablet Take 1,000 mg by mouth every 8 (eight) hours as needed.    . lidocaine-prilocaine (EMLA) cream Apply to affected area once 30 g 3  . morphine (MSIR) 15 MG tablet Take 1 tablet (15 mg total) by mouth every 6 (six) hours as needed for severe pain. 30 tablet 0  . ondansetron (ZOFRAN) 8 MG tablet Take 1 tablet (8 mg total) by mouth every 8 (eight) hours as needed. Start on the third day after chemotherapy. 30 tablet 1  . prochlorperazine (COMPAZINE) 10 MG tablet Take 1 tablet (10 mg total) by mouth every 6 (six) hours as needed (Nausea or vomiting). 30 tablet 1  . SUMAtriptan (IMITREX) 100 MG tablet Take 1 tablet (100 mg total) by mouth once for 1 dose. May repeat in 2 hours if no relief. 10 tablet 11   No current facility-administered medications for this visit.     PHYSICAL EXAMINATION: ECOG  PERFORMANCE STATUS: 1 - Symptomatic but completely ambulatory  Vitals:   05/07/18 1135  BP: (!) 129/98  Pulse: 64  Resp: 18  Temp: 97.6 F (36.4 C)  SpO2: 100%   Filed Weights   05/07/18 1135  Weight: 147 lb 9.6 oz (67 kg)    GENERAL:alert, no distress and comfortable NEURO: alert & oriented x 3 with fluent speech, no focal motor/sensory deficits  LABORATORY DATA:  I have reviewed the data as listed No results found for: NA, K, CL, CO2, GLUCOSE, BUN, CREATININE, CALCIUM, PROT, ALBUMIN, AST, ALT, ALKPHOS, BILITOT, GFRNONAA, GFRAA  No results found for: SPEP, UPEP  No results found for: WBC, NEUTROABS, HGB, HCT, MCV, PLT    Chemistry   No results found for: NA, K, CL, CO2, BUN, CREATININE, GLU No results found for: CALCIUM, ALKPHOS, AST, ALT, BILITOT     RADIOGRAPHIC STUDIES: I have personally reviewed the radiological images as listed and agreed with the findings in the report. Mr Pelvis W Wo Contrast  Result Date: 04/27/2018 CLINICAL DATA:  Clinical stage IB cervical cancer. Pelvic staging evaluation. EXAM: MRI PELVIS WITHOUT AND WITH CONTRAST TECHNIQUE: Multiplanar multisequence MR imaging of the pelvis was performed both before and after administration of intravenous contrast. CONTRAST:  7.5 cc Gadavist IV. COMPARISON:  04/24/2018 PET-CT. FINDINGS: Urinary Tract:  Normal bladder.  Normal urethra. Bowel: Visualized small and large bowel are normal caliber with no bowel wall thickening. Vascular/Lymphatic: No pathologically enlarged lymph nodes in the pelvis. Bilateral pelvic sidewall (external iliac) rounded subcentimeter lymph nodes measuring 0.6 cm on the right (series 17/image 8) and 0.6 cm on the left (series 17/image 10), unchanged from 04/24/2018 PET-CT. Reproductive: Uterus: The anteverted uterus measures 8.9 x 4.8 x 5.2 cm. No uterine fibroids. Inner myometrium (junctional zone) measures 12 mm in thickness, which is compatible with mild diffuse adenomyosis. Endometrium  measures 5 mm in bilayer thickness. There is a T2 hyperintense 5.5 x 3.4 x 3.2 cm mass in the uterine cervix (series 5/image 16). This mass invades the lower uterine body. No clear invasion of the vagina by the mass. There is loss of the normal T2 hypointense outer cervical stroma in multiple locations, including along the posterior upper left cervix (series 4/image 19 and series 5/image 17) and lateral lower cervix (series 4/images 21 and 23), compatible with mild parametrial invasion. No direct invasion of colon or bladder. No extension to the pelvic sidewall. No hydronephrosis on limited views of the kidneys. Ovaries and Adnexa: The right ovary measures 2.3 x 1.4 x 1.0 cm and is normal. The left ovary measures 2.7 x 1.1 x 1.4 cm and is normal. There are no suspicious ovarian or adnexal masses. Other: Small volume simple free fluid in the pelvic cul-de-sac. No focal pelvic fluid collection. Musculoskeletal: No aggressive appearing focal osseous lesions. Moderate degenerative disc disease at L4-5. IMPRESSION: 1. Uterine cervix 5.5 x 3.4 x 3.2 cm mass compatible with primary cervical malignancy, with mild parametrial invasion. Stage IIB by MRI. 2. Small volume simple free fluid in the pelvic cul-de-sac. 3. No pathologically enlarged pelvic lymph nodes, see comments. Electronically Signed   By: Ilona Sorrel M.D.   On: 04/27/2018 13:05   Nm Pet Image Initial (pi) Skull Base To Thigh  Result Date: 04/24/2018 CLINICAL DATA:  Initial treatment strategy for cervical cancer. EXAM: NUCLEAR MEDICINE PET SKULL BASE TO THIGH TECHNIQUE: 7.66 mCi F-18 FDG was injected intravenously. Full-ring PET imaging was performed from the skull base to thigh after the radiotracer. CT data was obtained and used for attenuation correction and anatomic localization. Fasting blood glucose: 89 mg/dl COMPARISON:  None FINDINGS: Mediastinal blood pool activity: SUV max 3.04. NECK: No hypermetabolic lymph nodes in the neck. Incidental CT  findings: none CHEST: No hypermetabolic axillary or supraclavicular adenopathy. No hypermetabolic mediastinal or hilar adenopathy identified. Tiny noncalcified solid nodule in the right upper lobe is too small to characterize measuring 2 mm, image 25/8. Within the right middle lobe there is a small round dense nodule measuring 3 mm. Also too small to characterize. Incidental CT findings: None ABDOMEN/PELVIS: No abnormal radiotracer activity within the liver. No abnormal uptake within the spleen, pancreas, adrenal glands. No hypermetabolic abdominal adenopathy. No hypermetabolic pelvic or inguinal lymph nodes. 7 mm right external iliac lymph node is identified, image 159/4. There is mild uptake within this node with SUV max of 2.5. Left pelvic sidewall lymph node measures 6 mm and has an SUV max of 2.19. Hypermetabolic mass involving the cervix and uterine cavity is identified this measures 4.7 by 3.6 by 5.3 cm and has an SUV max of 20.4. Incidental CT findings: none SKELETON: No focal hypermetabolic activity to suggest skeletal metastasis. Incidental CT findings: none IMPRESSION: 1. There is a large mass involving the cervix and endometrium which has a maximum dimension of 5.3 cm within SUV max of 20.4.  2. Small bilateral pelvic sidewall lymph nodes measuring less than 1 cm with mild nonspecific uptake. The no hypermetabolic adenopathy or evidence of distant metastatic disease. 3. Nonspecific pulmonary nodules measuring less than 5 mm are identified in the right lung. Too small to characterize by PET-CT. Electronically Signed   By: Kerby Moors M.D.   On: 04/24/2018 16:08    All questions were answered. The patient knows to call the clinic with any problems, questions or concerns. No barriers to learning was detected.  I spent 40 minutes counseling the patient face to face. The total time spent in the appointment was 55 minutes and more than 50% was on counseling and review of test results  Heath Lark,  MD 05/07/2018 12:34 PM

## 2018-05-07 NOTE — Progress Notes (Signed)
Pt called to inquire about receiving assistance for her medication.  I informed her of the [place].  She provided a letter of support and was approved for the $700 grant.  Pt is uninsured but drug replacement is not available for the medication she'll be on.  I'll provide her with a Medicaid application and a financial application to apply for a discount thru the hospital on 05/08/18.  I'll give her my card for any questions or concerns she may have in the future.

## 2018-05-07 NOTE — Telephone Encounter (Signed)
Called Sheena Torres and advised her that she can meet with [name], Patient Financial Advocate to apply for the grant.  Also gave her [name]'s phone number to call. She verbalized understanding and agreement.

## 2018-05-07 NOTE — Progress Notes (Signed)
Correction:  Pt was approved for the $1000 [employer].

## 2018-05-07 NOTE — Assessment & Plan Note (Signed)
We discussed the role of chemotherapy. The intent is of curative intent.  We discussed some of the risks, benefits, side-effects of cisplatin and its role as chemo sensitizing agent. The plan for weekly cisplatin for x 5-6 doses along with radiation treatment.  Some of the short term side-effects included, though not limited to, including weight loss, life threatening infections, risk of allergic reactions, need for transfusions of blood products, nausea, vomiting, change in bowel habits, loss of hair, admission to hospital for various reasons, and risks of death.   Long term side-effects are also discussed including risks of infertility, permanent damage to nerve function, hearing loss, chronic fatigue, kidney damage with possibility needing hemodialysis, and rare secondary malignancy including bone marrow disorders.  The patient is aware that the response rates discussed earlier is not guaranteed.  After a long discussion, patient made an informed decision to proceed with the prescribed plan of care.   Patient education material was dispensed.

## 2018-05-07 NOTE — Assessment & Plan Note (Signed)
She continues to have poorly controlled cancer pain I recommend morphine sulfate along with Tylenol as needed I warned her about risk of sedation, nausea and constipation I will assess for pain control next week

## 2018-05-07 NOTE — Assessment & Plan Note (Signed)
I advised her not to work during treatment.

## 2018-05-07 NOTE — Assessment & Plan Note (Signed)
She is young and has no evidence of comorbidities. The role of treatment is curative.

## 2018-05-08 ENCOUNTER — Ambulatory Visit (HOSPITAL_COMMUNITY)
Admission: RE | Admit: 2018-05-08 | Discharge: 2018-05-08 | Disposition: A | Discharge status: Home / Self Care | Payer: Self-pay | Source: Ambulatory Visit | Attending: Hematology and Oncology | Admitting: Hematology and Oncology

## 2018-05-08 ENCOUNTER — Encounter (HOSPITAL_COMMUNITY): Payer: Self-pay

## 2018-05-08 ENCOUNTER — Other Ambulatory Visit: Payer: Self-pay

## 2018-05-08 ENCOUNTER — Ambulatory Visit
Admission: RE | Admit: 2018-05-08 | Discharge: 2018-05-08 | Disposition: A | Discharge status: Home / Self Care | Payer: Self-pay | Source: Ambulatory Visit | Attending: Radiation Oncology | Admitting: Radiation Oncology

## 2018-05-08 DIAGNOSIS — C539 Malignant neoplasm of cervix uteri, unspecified: Secondary | ICD-10-CM | POA: Insufficient documentation

## 2018-05-08 DIAGNOSIS — Z510 Encounter for antineoplastic radiation therapy: Secondary | ICD-10-CM | POA: Insufficient documentation

## 2018-05-08 DIAGNOSIS — C530 Malignant neoplasm of endocervix: Secondary | ICD-10-CM | POA: Insufficient documentation

## 2018-05-08 DIAGNOSIS — Z95828 Presence of other vascular implants and grafts: Secondary | ICD-10-CM

## 2018-05-08 HISTORY — PX: IR IMAGING GUIDED PORT INSERTION: IMG5740

## 2018-05-08 HISTORY — DX: Presence of other vascular implants and grafts: Z95.828

## 2018-05-08 LAB — BASIC METABOLIC PANEL
Anion gap: 10 (ref 5–15)
BUN: 11 mg/dL (ref 6–20)
CO2: 24 mmol/L (ref 22–32)
Calcium: 9.5 mg/dL (ref 8.9–10.3)
Chloride: 104 mmol/L (ref 98–111)
Creatinine, Ser: 0.74 mg/dL (ref 0.44–1.00)
GFR calc Af Amer: >60 mL/min (ref >60)
GFR calc non Af Amer: >60 mL/min (ref >60)
Glucose, Bld: 86 mg/dL (ref 70–99)
Potassium: 3.8 mmol/L (ref 3.5–5.1)
Sodium: 138 mmol/L (ref 135–145)

## 2018-05-08 LAB — CBC WITH DIFFERENTIAL/PLATELET
Abs Immature Granulocytes: 0.01 10*3/uL (ref 0.00–0.07)
Basophils Absolute: 0 10*3/uL (ref 0.0–0.1)
Basophils Relative: 0 %
Eosinophils Absolute: 0.3 10*3/uL (ref 0.0–0.5)
Eosinophils Relative: 6 %
HCT: 37.4 % (ref 36.0–46.0)
Hemoglobin: 11.9 g/dL — ABNORMAL LOW (ref 12.0–15.0)
Immature Granulocytes: 0 %
Lymphocytes Relative: 24 %
Lymphs Abs: 1.2 10*3/uL (ref 0.7–4.0)
MCH: 31 pg (ref 26.0–34.0)
MCHC: 31.8 g/dL (ref 30.0–36.0)
MCV: 97.4 fL (ref 80.0–100.0)
Monocytes Absolute: 0.3 10*3/uL (ref 0.1–1.0)
Monocytes Relative: 7 %
Neutro Abs: 3 10*3/uL (ref 1.7–7.7)
Neutrophils Relative %: 63 %
Platelets: 290 10*3/uL (ref 150–400)
RBC: 3.84 MIL/uL — ABNORMAL LOW (ref 3.87–5.11)
RDW: 12.5 % (ref 11.5–15.5)
WBC: 4.8 10*3/uL (ref 4.0–10.5)
nRBC: 0 % (ref 0.0–0.2)

## 2018-05-08 LAB — PROTIME-INR
INR: 0.9 (ref 0.8–1.2)
Prothrombin Time: 12.1 seconds (ref 11.4–15.2)

## 2018-05-08 MED ORDER — HEPARIN SOD (PORK) LOCK FLUSH 100 UNIT/ML IV SOLN
INTRAVENOUS | Status: AC
  Filled 2018-05-08: qty 5

## 2018-05-08 MED ORDER — LIDOCAINE HCL 1 % IJ SOLN
INTRAMUSCULAR | Status: AC
  Filled 2018-05-08: qty 20

## 2018-05-08 MED ORDER — LIDOCAINE-EPINEPHRINE (PF) 1 %-1:200000 IJ SOLN
INTRAMUSCULAR | Status: AC | PRN
  Administered 2018-05-08: 10 mL

## 2018-05-08 MED ORDER — MIDAZOLAM HCL 2 MG/2ML IJ SOLN
INTRAMUSCULAR | Status: AC
  Filled 2018-05-08: qty 4

## 2018-05-08 MED ORDER — LIDOCAINE HCL 1 % IJ SOLN
INTRAMUSCULAR | Status: AC | PRN
  Administered 2018-05-08: 5 mL

## 2018-05-08 MED ORDER — MIDAZOLAM HCL 2 MG/2ML IJ SOLN
INTRAMUSCULAR | Status: AC | PRN
  Administered 2018-05-08: 1 mg via INTRAVENOUS
  Administered 2018-05-08: 0.5 mg via INTRAVENOUS
  Administered 2018-05-08: 1 mg via INTRAVENOUS
  Administered 2018-05-08: 0.5 mg via INTRAVENOUS

## 2018-05-08 MED ORDER — SODIUM CHLORIDE 0.9 % IV SOLN
INTRAVENOUS | Status: DC
  Administered 2018-05-08: 11:00:00 via INTRAVENOUS

## 2018-05-08 MED ORDER — FENTANYL CITRATE (PF) 100 MCG/2ML IJ SOLN
INTRAMUSCULAR | Status: AC
  Filled 2018-05-08: qty 2

## 2018-05-08 MED ORDER — LIDOCAINE-EPINEPHRINE (PF) 2 %-1:200000 IJ SOLN
INTRAMUSCULAR | Status: AC
  Filled 2018-05-08: qty 20

## 2018-05-08 MED ORDER — CEFAZOLIN SODIUM-DEXTROSE 2-4 GM/100ML-% IV SOLN
2.0000 g | INTRAVENOUS | Status: AC
  Administered 2018-05-08: 2 g via INTRAVENOUS

## 2018-05-08 MED ORDER — ACETAMINOPHEN 500 MG PO TABS
500.0000 mg | ORAL_TABLET | Freq: Four times a day (QID) | ORAL | Status: DC | PRN
  Administered 2018-05-08: 500 mg via ORAL
  Filled 2018-05-08: qty 1

## 2018-05-08 MED ORDER — FENTANYL CITRATE (PF) 100 MCG/2ML IJ SOLN
INTRAMUSCULAR | Status: AC | PRN
  Administered 2018-05-08 (×2): 25 ug via INTRAVENOUS
  Administered 2018-05-08: 50 ug via INTRAVENOUS

## 2018-05-08 MED ORDER — HEPARIN SOD (PORK) LOCK FLUSH 100 UNIT/ML IV SOLN
INTRAVENOUS | Status: AC | PRN
  Administered 2018-05-08: 500 [IU] via INTRAVENOUS

## 2018-05-08 MED ORDER — CEFAZOLIN SODIUM-DEXTROSE 2-4 GM/100ML-% IV SOLN
INTRAVENOUS | Status: AC
  Administered 2018-05-08: 2 g via INTRAVENOUS
  Filled 2018-05-08: qty 100

## 2018-05-08 MED FILL — ONDANSETRON HCL 8 MG TABLET: 8 | 10 days supply | Qty: 30 | Fill #0

## 2018-05-08 MED FILL — LIDOCAINE-PRILOCAINE CREAM: 2.5-2.5 | 30 days supply | Qty: 30 | Fill #0

## 2018-05-08 MED FILL — PROCHLORPERAZINE 10 MG TAB: 10 | 8 days supply | Qty: 30 | Fill #0

## 2018-05-08 MED FILL — MORPHINE SULFATE IR 15 MG T: 15 | 8 days supply | Qty: 30 | Fill #0

## 2018-05-08 NOTE — Progress Notes (Addendum)
  Radiation Oncology         [phone]) [phone] ________________________________  Name: Sheena Torres MRN: [id number]  Date: 05/08/2018  DOB: [birth year]  SIMULATION AND TREATMENT PLANNING NOTE    ICD-10-CM   1. 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  Identity was confirmed.  All relevant records and images related to the planned course of therapy were reviewed.  The patient freely provided informed written consent to proceed with treatment after reviewing the details related to the planned course of therapy. The consent form was witnessed and verified by the simulation staff.  Then, the patient was set-up in a stable reproducible  supine position for radiation therapy.  CT images were obtained.  Surface markings were placed.  The CT images were loaded into the planning software.  Then the target and avoidance structures were contoured.  Treatment planning then occurred.  The radiation prescription was entered and confirmed.  Then, I designed and supervised the construction of a total of 5 medically necessary complex treatment devices.  I have requested : 3D Simulation  I have requested a DVH of the following structures: Cervix, uterus, bladder, rectum, rectosigmoid and bowel.  I have ordered:dose calc.   Pelvic exam:  on palpation the cervix measures at least 4-5 cm and protrudes into the upper vagina. There is no palpable parametrial infiltration. (MRI shows early parametrial involvement).               PLAN:  The patient will receive 45 Gy in 25 fractions directed at the pelvis along with radiosensitizing chemotherapy. She will then proceed with parametrial/sidewall boost of 9 Gy in 5 fractions. This will followed by 5 high dose rate intracavitary treatments with Iridium 192 directed at the cervix.  Treatments will begin on April 2 with chemotherapy starting   April 3.  If the patient develops significant vaginal bleeding then her radiation treatments will expedited.  Special Treatment Procedure Note: The patient will be receiving radiosensitizing chemotherapy. Given the potential of increased toxicities related to combined therapy and the necessity for close monitoring of the patient and blood work, this constitutes a special treatment procedure.    -----------------------------------  Blair Promise, PhD, MD

## 2018-05-08 NOTE — Discharge Instructions (Signed)
Do not use the numbing cream(provided by cancer center MD) for 2 weeks.  Your site needs a chance to heal and the cream may cause the surgical glue to come off to early. Leave your dressing in place for 24 hours, after that you may remove dressing and then you may shower.  You do not have to place another dressing over the site.  The surgical glue over your site will flake off as you heal.    Implanted Port Insertion, Care After This sheet gives you information about how to care for yourself after your procedure. Your health care provider may also give you more specific instructions. If you have problems or questions, contact your health care provider. What can I expect after the procedure? After the procedure, it is common to have:  Discomfort at the port insertion site.  Bruising on the skin over the port. This should improve over 3-4 days. Follow these instructions at home: [facility] care  After your port is placed, you will get a manufacturer's information card. The card has information about your port. Keep this card with you at all times.  Take care of the port as told by your health care provider. Ask your health care provider if you or a family member can get training for taking care of the port at home. A home health care nurse may also take care of the port.  Make sure to remember what type of port you have. Incision care      Follow instructions from your health care provider about how to take care of your port insertion site. Make sure you: ? Wash your hands with soap and water before and after you change your bandage (dressing). If soap and water are not available, use hand sanitizer. ? Change your dressing as told by your health care provider. ? Leave stitches (sutures), skin glue, or adhesive strips in place. These skin closures may need to stay in place for 2 weeks or longer. If adhesive strip edges start to loosen and curl up, you may trim the loose edges. Do not remove  adhesive strips completely unless your health care provider tells you to do that.  Check your port insertion site every day for signs of infection. Check for: ? Redness, swelling, or pain. ? Fluid or blood. ? Warmth. ? Pus or a bad smell. Activity  Return to your normal activities as told by your health care provider. Ask your health care provider what activities are safe for you.  Do not lift anything that is heavier than 10 lb (4.5 kg), or the limit that you are told, until your health care provider says that it is safe. General instructions  Take over-the-counter and prescription medicines only as told by your health care provider.  Do not take baths, swim, or use a hot tub until your health care provider approves. Ask your health care provider if you may take showers. You may only be allowed to take sponge baths.  Do not drive for 24 hours if you were given a sedative during your procedure.  Wear a medical alert bracelet in case of an emergency. This will tell any health care providers that you have a port.  Keep all follow-up visits as told by your health care provider. This is important. Contact a health care provider if:  You cannot flush your port with saline as directed, or you cannot draw blood from the port.  You have a fever or chills.  You have redness,  swelling, or pain around your port insertion site.  You have fluid or blood coming from your port insertion site.  Your port insertion site feels warm to the touch.  You have pus or a bad smell coming from the port insertion site. Get help right away if:  You have chest pain or shortness of breath.  You have bleeding from your port that you cannot control. Summary  Take care of the port as told by your health care provider. Keep the manufacturer's information card with you at all times.  Change your dressing as told by your health care provider.  Contact a health care provider if you have a fever or chills  or if you have redness, swelling, or pain around your port insertion site.  Keep all follow-up visits as told by your health care provider. This information is not intended to replace advice given to you by your health care provider. Make sure you discuss any questions you have with your health care provider. Document Released: 11/18/2012 Document Revised: 08/26/2017 Document Reviewed: 08/26/2017 Elsevier Interactive Patient Education  2019 [employer].  Moderate Conscious Sedation, Adult, Care After These instructions provide you with information about caring for yourself after your procedure. Your health care provider may also give you more specific instructions. Your treatment has been planned according to current medical practices, but problems sometimes occur. Call your health care provider if you have any problems or questions after your procedure. What can I expect after the procedure? After your procedure, it is common:  To feel sleepy for several hours.  To feel clumsy and have poor balance for several hours.  To have poor judgment for several hours.  To vomit if you eat too soon. Follow these instructions at home: For at least 24 hours after the procedure:   Do not: ? Participate in activities where you could fall or become injured. ? Drive. ? Use heavy machinery. ? Drink alcohol. ? Take sleeping pills or medicines that cause drowsiness. ? Make important decisions or sign legal documents. ? Take care of children on your own.  Rest. Eating and drinking  Follow the diet recommended by your health care provider.  If you vomit: ? Drink water, juice, or soup when you can drink without vomiting. ? Make sure you have little or no nausea before eating solid foods. General instructions  Have a responsible adult stay with you until you are awake and alert.  Take over-the-counter and prescription medicines only as told by your health care provider.  If you smoke, do not  smoke without supervision.  Keep all follow-up visits as told by your health care provider. This is important. Contact a health care provider if:  You keep feeling nauseous or you keep vomiting.  You feel light-headed.  You develop a rash.  You have a fever. Get help right away if:  You have trouble breathing. This information is not intended to replace advice given to you by your health care provider. Make sure you discuss any questions you have with your health care provider. Document Released: 11/18/2012 Document Revised: 07/03/2015 Document Reviewed: 05/20/2015 Elsevier Interactive Patient Education  2019 [employer].

## 2018-05-08 NOTE — Procedures (Signed)
Interventional Radiology Procedure:   Indications: Cervical cancer  Procedure: Port placement  Findings: Right jugular port, tip at SVC/RA junction  Complications: None     EBL: Minimal, less than 10 ml  Plan: Discharge in one hour.  Keep port site and incisions dry for at least 24 hours.     Lavonda Thal R. Louine Tenpenny, MD  Pager: [phone]    

## 2018-05-08 NOTE — Progress Notes (Signed)
Per Lennette Bihari, [place], pt does not need pregnancy test prior to procedure. PT states there is no chance that she is pregnant. Cancelled order for serum qualitative per [place], [place].

## 2018-05-08 NOTE — Consult Note (Signed)
Chief Complaint: Patient was seen in consultation today for port a cath placement  Referring Physician(s): Gorsuch,Ni  Supervising Physician: Markus Daft  Patient Status: [facility] - Out-pt  History of Present Illness: Sheena Torres is a 53 y.o. 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  Past Medical History:  Diagnosis Date  . ADD (attention deficit disorder)   . Cancer ([facility])   . Migraine     Past Surgical History:  Procedure Laterality Date  . BREAST ENHANCEMENT SURGERY    . CESAREAN SECTION      Allergies: Patient has no known allergies.  Medications: Prior to Admission medications   Medication Sig Start Date End Date Taking? Authorizing Provider  acetaminophen (TYLENOL) 500 MG tablet Take 1,000 mg by mouth every 8 (eight) hours as needed.   Yes [provider]  polyethylene glycol (MIRALAX / GLYCOLAX) packet Take 17 g by mouth daily.   Yes [provider]  SUMAtriptan (IMITREX) 100 MG tablet Take 1 tablet (100 mg total) by mouth once for 1 dose. May repeat in 2 hours if no relief. 03/17/17 05/08/18 Yes Terald Sleeper, PA-C  lidocaine-prilocaine (EMLA) cream Apply to affected area once 05/07/18   Heath Lark, MD  morphine (MSIR) 15 MG tablet Take 1 tablet (15 mg total) by mouth every 6 (six) hours as needed for severe pain. 05/07/18   Heath Lark, MD  ondansetron (ZOFRAN) 8 MG tablet Take 1 tablet (8 mg total) by mouth every 8 (eight) hours as needed. Start on the third day after chemotherapy. 05/07/18   Heath Lark, MD  prochlorperazine (COMPAZINE) 10 MG tablet Take 1 tablet (10 mg total) by mouth every 6 (six) hours as needed (Nausea or vomiting). 05/07/18   Heath Lark, MD     Family History  Problem Relation Age of Onset  . Heart disease Mother   . Diabetes Father   . Heart disease Father   . Cancer Maternal Grandmother        mouth/jaw cancer     Social History   Socioeconomic History  . Marital status: Divorced    Spouse name: Not on file  . Number of children: 3  . Years of education: Not on file  . Highest education level: Not on file  Occupational History  . Occupation: Leisure centre manager  Social Needs  . Financial resource strain: Not on file  . Food insecurity:    Worry: Not on file    Inability: Not on file  . Transportation needs:    Medical: Not on file    Non-medical: Not on file  Tobacco Use  . Smoking status: Never Smoker  . Smokeless tobacco: Never Used  Substance and Sexual Activity  . Alcohol use: Yes    Comment: occasional   . Drug use: No  . Sexual activity: Not on file  Lifestyle  . Physical activity:    Days per week: Not on file    Minutes per session: Not on file  . Stress: Not on file  Relationships  . Social connections:    Talks on phone: Not on file    Gets together: Not on file    Attends religious service: Not on file    Active member of club or organization: Not on file    Attends meetings of clubs or organizations: Not on file    Relationship status: Not on file  Other Topics Concern  . Not  on file  Social History Narrative  . Not on file      Review of Systems denies fever, headache, chest pain, dyspnea, cough, back pain, nausea, vomiting.  She does have pelvic pain and some vaginal bleeding.  Vital Signs: Blood pressure 127/79, heart rate 64, temp 97.7, respirations 15, O2 sat 100% room air LMP  (LMP Unknown) Comment: irregular periods/vaginal bleeding due to tumor   Breastfeeding No   Physical Exam awake, alert.  Chest clear to auscultation bilaterally.  Heart with regular rate and rhythm.  Abdomen soft, positive bowel sounds, mildly tender pelvic region to palpation.  No lower extremity edema.  Imaging: Mr Pelvis W Wo Contrast  Result Date: 04/27/2018 CLINICAL DATA:  Clinical stage IB cervical cancer. Pelvic staging evaluation. EXAM: MRI PELVIS WITHOUT AND WITH CONTRAST  TECHNIQUE: Multiplanar multisequence MR imaging of the pelvis was performed both before and after administration of intravenous contrast. CONTRAST:  7.5 cc Gadavist IV. COMPARISON:  04/24/2018 PET-CT. FINDINGS: Urinary Tract:  Normal bladder.  Normal urethra. Bowel: Visualized small and large bowel are normal caliber with no bowel wall thickening. Vascular/Lymphatic: No pathologically enlarged lymph nodes in the pelvis. Bilateral pelvic sidewall (external iliac) rounded subcentimeter lymph nodes measuring 0.6 cm on the right (series 17/image 8) and 0.6 cm on the left (series 17/image 10), unchanged from 04/24/2018 PET-CT. Reproductive: Uterus: The anteverted uterus measures 8.9 x 4.8 x 5.2 cm. No uterine fibroids. Inner myometrium (junctional zone) measures 12 mm in thickness, which is compatible with mild diffuse adenomyosis. Endometrium measures 5 mm in bilayer thickness. There is a T2 hyperintense 5.5 x 3.4 x 3.2 cm mass in the uterine cervix (series 5/image 16). This mass invades the lower uterine body. No clear invasion of the vagina by the mass. There is loss of the normal T2 hypointense outer cervical stroma in multiple locations, including along the posterior upper left cervix (series 4/image 19 and series 5/image 17) and lateral lower cervix (series 4/images 21 and 23), compatible with mild parametrial invasion. No direct invasion of colon or bladder. No extension to the pelvic sidewall. No hydronephrosis on limited views of the kidneys. Ovaries and Adnexa: The right ovary measures 2.3 x 1.4 x 1.0 cm and is normal. The left ovary measures 2.7 x 1.1 x 1.4 cm and is normal. There are no suspicious ovarian or adnexal masses. Other: Small volume simple free fluid in the pelvic cul-de-sac. No focal pelvic fluid collection. Musculoskeletal: No aggressive appearing focal osseous lesions. Moderate degenerative disc disease at L4-5. IMPRESSION: 1. Uterine cervix 5.5 x 3.4 x 3.2 cm mass compatible with primary  cervical malignancy, with mild parametrial invasion. Stage IIB by MRI. 2. Small volume simple free fluid in the pelvic cul-de-sac. 3. No pathologically enlarged pelvic lymph nodes, see comments. Electronically Signed   By: Ilona Sorrel M.D.   On: 04/27/2018 13:05   Nm Pet Image Initial (pi) Skull Base To Thigh  Result Date: 04/24/2018 CLINICAL DATA:  Initial treatment strategy for cervical cancer. EXAM: NUCLEAR MEDICINE PET SKULL BASE TO THIGH TECHNIQUE: 7.66 mCi F-18 FDG was injected intravenously. Full-ring PET imaging was performed from the skull base to thigh after the radiotracer. CT data was obtained and used for attenuation correction and anatomic localization. Fasting blood glucose: 89 mg/dl COMPARISON:  None FINDINGS: Mediastinal blood pool activity: SUV max 3.04. NECK: No hypermetabolic lymph nodes in the neck. Incidental CT findings: none CHEST: No hypermetabolic axillary or supraclavicular adenopathy. No hypermetabolic mediastinal or hilar adenopathy identified. Tiny noncalcified solid  nodule in the right upper lobe is too small to characterize measuring 2 mm, image 25/8. Within the right middle lobe there is a small round dense nodule measuring 3 mm. Also too small to characterize. Incidental CT findings: None ABDOMEN/PELVIS: No abnormal radiotracer activity within the liver. No abnormal uptake within the spleen, pancreas, adrenal glands. No hypermetabolic abdominal adenopathy. No hypermetabolic pelvic or inguinal lymph nodes. 7 mm right external iliac lymph node is identified, image 159/4. There is mild uptake within this node with SUV max of 2.5. Left pelvic sidewall lymph node measures 6 mm and has an SUV max of 2.19. Hypermetabolic mass involving the cervix and uterine cavity is identified this measures 4.7 by 3.6 by 5.3 cm and has an SUV max of 20.4. Incidental CT findings: none SKELETON: No focal hypermetabolic activity to suggest skeletal metastasis. Incidental CT findings: none IMPRESSION:  1. There is a large mass involving the cervix and endometrium which has a maximum dimension of 5.3 cm within SUV max of 20.4. 2. Small bilateral pelvic sidewall lymph nodes measuring less than 1 cm with mild nonspecific uptake. The no hypermetabolic adenopathy or evidence of distant metastatic disease. 3. Nonspecific pulmonary nodules measuring less than 5 mm are identified in the right lung. Too small to characterize by PET-CT. Electronically Signed   By: Kerby Moors M.D.   On: 04/24/2018 16:08    Labs:  CBC: No results for input(s): WBC, HGB, HCT, PLT in the last 8760 hours.  COAGS: No results for input(s): INR, APTT in the last 8760 hours.  BMP: No results for input(s): NA, K, CL, CO2, GLUCOSE, BUN, CALCIUM, CREATININE, GFRNONAA, GFRAA in the last 8760 hours.  Invalid input(s): CMP  LIVER FUNCTION TESTS: No results for input(s): BILITOT, AST, ALT, ALKPHOS, PROT, ALBUMIN in the last 8760 hours.  TUMOR MARKERS: No results for input(s): AFPTM, CEA, CA199, CHROMGRNA in the last 8760 hours.  Assessment and Plan: 53 y.o. 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  All of the patient's questions were answered, patient is agreeable to proceed. Consent signed and in chart.  LABS PENDING   Thank you for this interesting consult.  I greatly enjoyed meeting Sheena Torres and look forward to participating in their care.  A copy of this report was sent to the requesting provider on this date.  Electronically Signed: D. Rowe Robert, PA-C 05/08/2018, 10:43 AM   I spent a total of 25 minutes  in face to face in clinical consultation, greater than 50% of which was  counseling/coordinating care for Port-A-Cath placement

## 2018-05-12 ENCOUNTER — Encounter: Payer: Self-pay | Admitting: General Practice

## 2018-05-12 NOTE — Progress Notes (Signed)
[facility] Psychosocial Distress Screening Clinical Social Work  Clinical Social Work was referred by distress screening protocol.  The patient scored a 8 on the Psychosocial Distress Thermometer which indicates moderate distress. Clinical Social Worker contacted patient by phone to assess for distress and other psychosocial needs. Finances are a major concern at this point, started new job at employer who did not offer insurance. 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  Is trying to reconfigure/reduce living expenses so she can afford to live on reduced income.  Will mail information on financial resources   [place] 05/06/2018  Screening Type Initial Screening  Distress experienced in past week (1-10) 8  Practical problem type Insurance;Food  Family Problem type Other (comment)  Emotional problem type Nervousness/Anxiety  Physical Problem type Pain;Sleep/insomnia    Clinical Social Worker follow up needed: Yes.    If yes, follow up plan: Refer to [place] for help w insurance and/or finances.  Will send financial aid information sheet.  Beverely Pace, [place], LCSW Clinical Social Worker Phone:  [phone]

## 2018-05-12 NOTE — Progress Notes (Signed)
[facility] Team contacted patient to assess for food insecurity and other psychosocial needs during current COVID19 pandemic .   Patient/family expressed concerns regarding access to food or managing costs of food during this time.  Support Team member discussed resources and will follow up as resources are identified.  Support Team member encouraged patient to call if changes occur or they have any other questions/concerns.  Beverely Pace, [place]

## 2018-05-13 ENCOUNTER — Other Ambulatory Visit: Payer: Self-pay

## 2018-05-13 ENCOUNTER — Inpatient Hospital Stay: Payer: Self-pay | Attending: Gynecologic Oncology

## 2018-05-13 DIAGNOSIS — R110 Nausea: Secondary | ICD-10-CM | POA: Insufficient documentation

## 2018-05-13 DIAGNOSIS — K5909 Other constipation: Secondary | ICD-10-CM | POA: Insufficient documentation

## 2018-05-13 DIAGNOSIS — E876 Hypokalemia: Secondary | ICD-10-CM | POA: Insufficient documentation

## 2018-05-13 DIAGNOSIS — C530 Malignant neoplasm of endocervix: Secondary | ICD-10-CM | POA: Insufficient documentation

## 2018-05-13 DIAGNOSIS — Z5111 Encounter for antineoplastic chemotherapy: Secondary | ICD-10-CM | POA: Insufficient documentation

## 2018-05-13 DIAGNOSIS — Z5189 Encounter for other specified aftercare: Secondary | ICD-10-CM | POA: Insufficient documentation

## 2018-05-13 DIAGNOSIS — R197 Diarrhea, unspecified: Secondary | ICD-10-CM | POA: Insufficient documentation

## 2018-05-13 DIAGNOSIS — G893 Neoplasm related pain (acute) (chronic): Secondary | ICD-10-CM | POA: Insufficient documentation

## 2018-05-13 DIAGNOSIS — D61818 Other pancytopenia: Secondary | ICD-10-CM | POA: Insufficient documentation

## 2018-05-13 LAB — COMPREHENSIVE METABOLIC PANEL
ALT: 21 U/L (ref 0–44)
AST: 29 U/L (ref 15–41)
Albumin: 4.2 g/dL (ref 3.5–5.0)
Alkaline Phosphatase: 92 U/L (ref 38–126)
Anion gap: 10 (ref 5–15)
BUN: 10 mg/dL (ref 6–20)
CO2: 27 mmol/L (ref 22–32)
Calcium: 9.8 mg/dL (ref 8.9–10.3)
Chloride: 99 mmol/L (ref 98–111)
Creatinine, Ser: 0.83 mg/dL (ref 0.44–1.00)
GFR calc Af Amer: >60 mL/min (ref >60)
GFR calc non Af Amer: >60 mL/min (ref >60)
Glucose, Bld: 96 mg/dL (ref 70–99)
Potassium: 3.8 mmol/L (ref 3.5–5.1)
Sodium: 136 mmol/L (ref 135–145)
Total Bilirubin: 0.3 mg/dL (ref 0.3–1.2)
Total Protein: 7.5 g/dL (ref 6.5–8.1)

## 2018-05-13 LAB — CBC WITH DIFFERENTIAL/PLATELET
Abs Immature Granulocytes: 0.01 10*3/uL (ref 0.00–0.07)
Basophils Absolute: 0 10*3/uL (ref 0.0–0.1)
Basophils Relative: 1 %
Eosinophils Absolute: 0.3 10*3/uL (ref 0.0–0.5)
Eosinophils Relative: 6 %
HCT: 33.9 % — ABNORMAL LOW (ref 36.0–46.0)
Hemoglobin: 11.2 g/dL — ABNORMAL LOW (ref 12.0–15.0)
Immature Granulocytes: 0 %
Lymphocytes Relative: 24 %
Lymphs Abs: 1.4 10*3/uL (ref 0.7–4.0)
MCH: 30.9 pg (ref 26.0–34.0)
MCHC: 33 g/dL (ref 30.0–36.0)
MCV: 93.4 fL (ref 80.0–100.0)
Monocytes Absolute: 0.3 10*3/uL (ref 0.1–1.0)
Monocytes Relative: 6 %
Neutro Abs: 3.7 10*3/uL (ref 1.7–7.7)
Neutrophils Relative %: 63 %
Platelets: 306 10*3/uL (ref 150–400)
RBC: 3.63 MIL/uL — ABNORMAL LOW (ref 3.87–5.11)
RDW: 12.1 % (ref 11.5–15.5)
WBC: 5.8 10*3/uL (ref 4.0–10.5)
nRBC: 0 % (ref 0.0–0.2)

## 2018-05-13 LAB — MAGNESIUM: Magnesium: 1.9 mg/dL (ref 1.7–2.4)

## 2018-05-14 ENCOUNTER — Ambulatory Visit: Payer: Self-pay | Admitting: Radiation Oncology

## 2018-05-14 ENCOUNTER — Other Ambulatory Visit: Payer: Self-pay

## 2018-05-14 ENCOUNTER — Encounter: Payer: Self-pay | Admitting: Hematology and Oncology

## 2018-05-14 ENCOUNTER — Encounter: Payer: Self-pay | Admitting: General Practice

## 2018-05-14 ENCOUNTER — Ambulatory Visit
Admission: RE | Admit: 2018-05-14 | Discharge: 2018-05-14 | Disposition: A | Discharge status: Home / Self Care | Payer: Self-pay | Source: Ambulatory Visit | Attending: Radiation Oncology | Admitting: Radiation Oncology

## 2018-05-14 ENCOUNTER — Ambulatory Visit: Payer: Self-pay

## 2018-05-14 ENCOUNTER — Inpatient Hospital Stay (HOSPITAL_BASED_OUTPATIENT_CLINIC_OR_DEPARTMENT_OTHER): Payer: Self-pay | Admitting: Hematology and Oncology

## 2018-05-14 DIAGNOSIS — C530 Malignant neoplasm of endocervix: Secondary | ICD-10-CM

## 2018-05-14 DIAGNOSIS — G893 Neoplasm related pain (acute) (chronic): Secondary | ICD-10-CM

## 2018-05-14 DIAGNOSIS — Z510 Encounter for antineoplastic radiation therapy: Secondary | ICD-10-CM | POA: Insufficient documentation

## 2018-05-14 DIAGNOSIS — K5909 Other constipation: Secondary | ICD-10-CM

## 2018-05-14 LAB — HIV ANTIBODY (ROUTINE TESTING W REFLEX): HIV Screen 4th Generation wRfx: NONREACTIVE

## 2018-05-14 NOTE — Progress Notes (Signed)
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  I think her bloating sensation is related to recent constipation  Cancer associated pain Her pain is well controlled with Tylenol as well as iron morphine as needed She will continue the same   No orders of the defined types were placed in this encounter.   INTERVAL HISTORY: Please see below for problem oriented charting. She returns for further follow-up She is scheduled to start radiation today and chemotherapy tomorrow Since last time I saw her, she has taken some immediate release morphine as needed for pelvic pain She complained of recent mild constipation and bloating She is taking daily MiraLAX She denies recent vaginal bleeding  SUMMARY OF ONCOLOGIC HISTORY:   Malignant neoplasm of endocervix ([place])   09/15/2017 Initial Diagnosis    The patient reports postcoital bleeding since August, 2019 and postmenopausal bleeding since November, 2019. 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     04/16/2018 Surgery    She was taken to the OR on 04/16/18 for a cervical cone biopsy. The specimen revealed a grossly normal cervix on the ectocervix.  Cone specimen revealed squamous cell carcinoma.  When discussing this with the pathologist he reported that in the actual cone specimen itself that was CIN 3 however there was a separate fragment of tissue that included carcinoma.  The dimensions of this was 0.6 cm, however this involved the margins.  A post cone ECC was positive for squamous cell carcinoma, as well as an  endometrial curettage which also contain benign endometrial glands.     04/16/2018 Pathology Results    Endocervix curettage: [facility]    04/22/2018 Initial Diagnosis    Malignant neoplasm of endocervix ([place])    04/24/2018 PET scan    1. There is a large mass involving the cervix and endometrium which has a maximum dimension of 5.3 cm within SUV max of 20.4. 2. Small bilateral pelvic sidewall lymph nodes measuring less than 1cm with mild nonspecific uptake. The no hypermetabolic adenopathy or evidence of distant metastatic disease. 3. Nonspecific pulmonary nodules measuring less than 5 mm are identified in the right lung. Too small to characterize by PET-CT.     04/27/2018 Imaging    MRI pelvis  1. Uterine cervix 5.5 x 3.4 x 3.2 cm mass compatible with primary cervical malignancy, with mild parametrial invasion. Stage IIB by MRI. 2. Small volume simple free fluid in the pelvic cul-de-sac. 3. No pathologically enlarged pelvic lymph nodes, see comments.    05/08/2018 Procedure    Placement of a subcutaneous port device. Catheter tip at the superior cavoatrial junction.     REVIEW OF SYSTEMS:   Constitutional: Denies fevers, chills or abnormal weight loss Eyes: Denies blurriness of vision Ears, nose, mouth, throat, and face: Denies mucositis or sore throat Respiratory: Denies cough, dyspnea or wheezes Cardiovascular: Denies palpitation, chest discomfort or lower extremity swelling Skin: Denies abnormal skin rashes Lymphatics: Denies new lymphadenopathy or easy bruising Neurological:Denies numbness, tingling or new weaknesses Behavioral/Psych: Mood is stable, no new changes  All other systems were reviewed with  the patient and are negative.  I have reviewed the past medical history, past surgical history, social history and family history with the patient and they are unchanged from previous note.  ALLERGIES:  has No Known Allergies.  MEDICATIONS:  Current Outpatient Medications   Medication Sig Dispense Refill  . acetaminophen (TYLENOL) 500 MG tablet Take 1,000 mg by mouth every 8 (eight) hours as needed.    . lidocaine-prilocaine (EMLA) cream Apply to affected area once 30 g 3  . morphine (MSIR) 15 MG tablet Take 1 tablet (15 mg total) by mouth every 6 (six) hours as needed for severe pain. 30 tablet 0  . ondansetron (ZOFRAN) 8 MG tablet Take 1 tablet (8 mg total) by mouth every 8 (eight) hours as needed. Start on the third day after chemotherapy. 30 tablet 1  . polyethylene glycol (MIRALAX / GLYCOLAX) packet Take 17 g by mouth daily.    . prochlorperazine (COMPAZINE) 10 MG tablet Take 1 tablet (10 mg total) by mouth every 6 (six) hours as needed (Nausea or vomiting). 30 tablet 1  . SUMAtriptan (IMITREX) 100 MG tablet Take 1 tablet (100 mg total) by mouth once for 1 dose. May repeat in 2 hours if no relief. 10 tablet 11   No current facility-administered medications for this visit.     PHYSICAL EXAMINATION: ECOG PERFORMANCE STATUS: 1 - Symptomatic but completely ambulatory  Vitals:   05/14/18 1031  BP: 125/69  Pulse: 69  Resp: 18  Temp: (!) 97.5 F (36.4 C)  SpO2: 100%   Filed Weights   05/14/18 1031  Weight: 148 lb 3.2 oz (67.2 kg)    GENERAL:alert, no distress and comfortable SKIN: skin color, texture, turgor are normal, no rashes or significant lesions EYES: normal, Conjunctiva are pink and non-injected, sclera clear OROPHARYNX:no exudate, no erythema and lips, buccal mucosa, and tongue normal  NECK: supple, thyroid normal size, non-tender, without nodularity LYMPH:  no palpable lymphadenopathy in the cervical, axillary or inguinal LUNGS: clear to auscultation and percussion with normal breathing effort HEART: regular rate & rhythm and no murmurs and no lower extremity edema ABDOMEN:abdomen soft, non-tender and normal bowel sounds Musculoskeletal:no cyanosis of digits and no clubbing  NEURO: alert & oriented x 3 with fluent speech, no focal  motor/sensory deficits  LABORATORY DATA:  I have reviewed the data as listed    Component Value Date/Time   NA 136 05/13/2018 1440   K 3.8 05/13/2018 1440   CL 99 05/13/2018 1440   CO2 27 05/13/2018 1440   GLUCOSE 96 05/13/2018 1440   BUN 10 05/13/2018 1440   CREATININE 0.83 05/13/2018 1440   CALCIUM 9.8 05/13/2018 1440   PROT 7.5 05/13/2018 1440   ALBUMIN 4.2 05/13/2018 1440   AST 29 05/13/2018 1440   ALT 21 05/13/2018 1440   ALKPHOS 92 05/13/2018 1440   BILITOT 0.3 05/13/2018 1440   GFRNONAA >60 05/13/2018 1440   GFRAA >60 05/13/2018 1440    No results found for: SPEP, UPEP  Lab Results  Component Value Date   WBC 5.8 05/13/2018   NEUTROABS 3.7 05/13/2018   HGB 11.2 (L) 05/13/2018   HCT 33.9 (L) 05/13/2018   MCV 93.4 05/13/2018   PLT 306 05/13/2018      Chemistry      Component Value Date/Time   NA 136 05/13/2018 1440   K 3.8 05/13/2018 1440   CL 99 05/13/2018 1440   CO2 27 05/13/2018 1440   BUN 10 05/13/2018 1440   CREATININE 0.83  05/13/2018 1440      Component Value Date/Time   CALCIUM 9.8 05/13/2018 1440   ALKPHOS 92 05/13/2018 1440   AST 29 05/13/2018 1440   ALT 21 05/13/2018 1440   BILITOT 0.3 05/13/2018 1440       RADIOGRAPHIC STUDIES: I have personally reviewed the radiological images as listed and agreed with the findings in the report. Mr Pelvis W Wo Contrast  Result Date: 04/27/2018 CLINICAL DATA:  Clinical stage IB cervical cancer. Pelvic staging evaluation. EXAM: MRI PELVIS WITHOUT AND WITH CONTRAST TECHNIQUE: Multiplanar multisequence MR imaging of the pelvis was performed both before and after administration of intravenous contrast. CONTRAST:  7.5 cc Gadavist IV. COMPARISON:  04/24/2018 PET-CT. FINDINGS: Urinary Tract:  Normal bladder.  Normal urethra. Bowel: Visualized small and large bowel are normal caliber with no bowel wall thickening. Vascular/Lymphatic: No pathologically enlarged lymph nodes in the pelvis. Bilateral pelvic sidewall  (external iliac) rounded subcentimeter lymph nodes measuring 0.6 cm on the right (series 17/image 8) and 0.6 cm on the left (series 17/image 10), unchanged from 04/24/2018 PET-CT. Reproductive: Uterus: The anteverted uterus measures 8.9 x 4.8 x 5.2 cm. No uterine fibroids. Inner myometrium (junctional zone) measures 12 mm in thickness, which is compatible with mild diffuse adenomyosis. Endometrium measures 5 mm in bilayer thickness. There is a T2 hyperintense 5.5 x 3.4 x 3.2 cm mass in the uterine cervix (series 5/image 16). This mass invades the lower uterine body. No clear invasion of the vagina by the mass. There is loss of the normal T2 hypointense outer cervical stroma in multiple locations, including along the posterior upper left cervix (series 4/image 19 and series 5/image 17) and lateral lower cervix (series 4/images 21 and 23), compatible with mild parametrial invasion. No direct invasion of colon or bladder. No extension to the pelvic sidewall. No hydronephrosis on limited views of the kidneys. Ovaries and Adnexa: The right ovary measures 2.3 x 1.4 x 1.0 cm and is normal. The left ovary measures 2.7 x 1.1 x 1.4 cm and is normal. There are no suspicious ovarian or adnexal masses. Other: Small volume simple free fluid in the pelvic cul-de-sac. No focal pelvic fluid collection. Musculoskeletal: No aggressive appearing focal osseous lesions. Moderate degenerative disc disease at L4-5. IMPRESSION: 1. Uterine cervix 5.5 x 3.4 x 3.2 cm mass compatible with primary cervical malignancy, with mild parametrial invasion. Stage IIB by MRI. 2. Small volume simple free fluid in the pelvic cul-de-sac. 3. No pathologically enlarged pelvic lymph nodes, see comments. Electronically Signed   By: Ilona Sorrel M.D.   On: 04/27/2018 13:05   Nm Pet Image Initial (pi) Skull Base To Thigh  Result Date: 04/24/2018 CLINICAL DATA:  Initial treatment strategy for cervical cancer. EXAM: NUCLEAR MEDICINE PET SKULL BASE TO THIGH  TECHNIQUE: 7.66 mCi F-18 FDG was injected intravenously. Full-ring PET imaging was performed from the skull base to thigh after the radiotracer. CT data was obtained and used for attenuation correction and anatomic localization. Fasting blood glucose: 89 mg/dl COMPARISON:  None FINDINGS: Mediastinal blood pool activity: SUV max 3.04. NECK: No hypermetabolic lymph nodes in the neck. Incidental CT findings: none CHEST: No hypermetabolic axillary or supraclavicular adenopathy. No hypermetabolic mediastinal or hilar adenopathy identified. Tiny noncalcified solid nodule in the right upper lobe is too small to characterize measuring 2 mm, image 25/8. Within the right middle lobe there is a small round dense nodule measuring 3 mm. Also too small to characterize. Incidental CT findings: None ABDOMEN/PELVIS: No abnormal radiotracer activity within  the liver. No abnormal uptake within the spleen, pancreas, adrenal glands. No hypermetabolic abdominal adenopathy. No hypermetabolic pelvic or inguinal lymph nodes. 7 mm right external iliac lymph node is identified, image 159/4. There is mild uptake within this node with SUV max of 2.5. Left pelvic sidewall lymph node measures 6 mm and has an SUV max of 2.19. Hypermetabolic mass involving the cervix and uterine cavity is identified this measures 4.7 by 3.6 by 5.3 cm and has an SUV max of 20.4. Incidental CT findings: none SKELETON: No focal hypermetabolic activity to suggest skeletal metastasis. Incidental CT findings: none IMPRESSION: 1. There is a large mass involving the cervix and endometrium which has a maximum dimension of 5.3 cm within SUV max of 20.4. 2. Small bilateral pelvic sidewall lymph nodes measuring less than 1 cm with mild nonspecific uptake. The no hypermetabolic adenopathy or evidence of distant metastatic disease. 3. Nonspecific pulmonary nodules measuring less than 5 mm are identified in the right lung. Too small to characterize by PET-CT. Electronically  Signed   By: Kerby Moors M.D.   On: 04/24/2018 16:08   Ir Imaging Guided Port Insertion  Result Date: 05/08/2018 INDICATION: 53 year old with cervical cancer. Port-A-Cath needed for chemotherapy. EXAM: FLUOROSCOPIC AND ULTRASOUND GUIDED PLACEMENT OF A SUBCUTANEOUS PORT COMPARISON:  None. MEDICATIONS: Ancef 2 g; The antibiotic was administered within an appropriate time interval prior to skin puncture. ANESTHESIA/SEDATION: Versed 3.0 mg IV; Fentanyl 100 mcg IV; Moderate Sedation Time:  40 minutes The patient was continuously monitored during the procedure by the interventional radiology nurse under my direct supervision. FLUOROSCOPY TIME:  30 seconds, 4 mGy COMPLICATIONS: None immediate. PROCEDURE: The procedure, risks, benefits, and alternatives were explained to the patient. Questions regarding the procedure were encouraged and answered. The patient understands and consents to the procedure. Patient was placed supine on the interventional table. Ultrasound confirmed a patent right internal jugular vein. Ultrasound image was saved for documentation. The right chest and neck were cleaned with a skin antiseptic and a sterile drape was placed. Maximal barrier sterile technique was utilized including caps, mask, sterile gowns, sterile gloves, sterile drape, hand hygiene and skin antiseptic. The right neck was anesthetized with 1% lidocaine. Small incision was made in the right neck with a blade. Micropuncture set was placed in the right internal jugular vein with ultrasound guidance. The micropuncture wire was used for measurement purposes. The right chest was anesthetized with 1% lidocaine with epinephrine. #15 blade was used to make an incision and a subcutaneous port pocket was formed. [place] was assembled. Subcutaneous tunnel was formed with a stiff tunneling device. The port catheter was brought through the subcutaneous tunnel. The port was placed in the subcutaneous pocket and sutured in place.  The micropuncture set was exchanged for a peel-away sheath. The catheter was placed through the peel-away sheath and the tip was positioned at the SVC/right atrium junction. Catheter placement was confirmed with fluoroscopy. The port was accessed and flushed with heparinized saline. The port pocket was closed using two layers of absorbable sutures and Dermabond. The vein skin site was closed using a single layer of absorbable suture and Dermabond. Sterile dressings were applied. Patient tolerated the procedure well without an immediate complication. Ultrasound and fluoroscopic images were taken and saved for this procedure. IMPRESSION: Placement of a subcutaneous port device. Catheter tip at the superior cavoatrial junction. Electronically Signed   By: Markus Daft M.D.   On: 05/08/2018 13:05    All questions were answered. The  patient knows to call the clinic with any problems, questions or concerns. No barriers to learning was detected.  I spent 15 minutes counseling the patient face to face. The total time spent in the appointment was 20 minutes and more than 50% was on counseling and review of test results  Heath Lark, MD 05/14/2018 12:03 PM

## 2018-05-14 NOTE — Assessment & Plan Note (Signed)
She has minimum pain She will proceed with radiation therapy and chemotherapy as scheduled I will continue to see her on a weekly basis for supportive care

## 2018-05-14 NOTE — Assessment & Plan Note (Signed)
We discussed laxative therapy.  I think her bloating sensation is related to recent constipation

## 2018-05-14 NOTE — Assessment & Plan Note (Signed)
Her pain is well controlled with Tylenol as well as iron morphine as needed She will continue the same

## 2018-05-14 NOTE — Progress Notes (Signed)
  Radiation Oncology         [phone]) [phone] ________________________________  Name: Sheena Torres MRN: [id number]  Date: 05/14/2018  DOB: Nov 21, 1965  Simulation Verification Note    ICD-10-CM   1. Malignant neoplasm of endocervix ([place]) C53.0     Status: outpatient  NARRATIVE: The patient was brought to the treatment unit and placed in the planned treatment position. The clinical setup was verified. Then port films were obtained and uploaded to the radiation oncology medical record software.  The treatment beams were carefully compared against the planned radiation fields. The position location and shape of the radiation fields was reviewed. They targeted volume of tissue appears to be appropriately covered by the radiation beams. Organs at risk appear to be excluded as planned.  Based on my personal review, I approved the simulation verification. The patient's treatment will proceed as planned.  -----------------------------------  Blair Promise, PhD, MD  This document serves as a record of services personally performed by Gery Pray, MD. It was created on his behalf by Rae Lips, a trained medical scribe. The creation of this record is based on the scribe's personal observations and the provider's statements to them. This document has been checked and approved by the attending provider.

## 2018-05-14 NOTE — Progress Notes (Signed)
[place] [facility] CSW Progress Notes  COVID 19 financial assistance and remote support information packet mailed.  Edwyna Shell, LCSW Clinical Social Worker Phone:  [phone]

## 2018-05-15 ENCOUNTER — Other Ambulatory Visit: Payer: Self-pay

## 2018-05-15 ENCOUNTER — Ambulatory Visit
Admission: RE | Admit: 2018-05-15 | Discharge: 2018-05-15 | Disposition: A | Discharge status: Home / Self Care | Payer: Self-pay | Source: Ambulatory Visit | Attending: Radiation Oncology | Admitting: Radiation Oncology

## 2018-05-15 ENCOUNTER — Inpatient Hospital Stay: Payer: Self-pay

## 2018-05-15 VITALS — BP 134/84 | HR 69 | Temp 98.0°F | Resp 18

## 2018-05-15 DIAGNOSIS — C530 Malignant neoplasm of endocervix: Secondary | ICD-10-CM

## 2018-05-15 MED ORDER — SODIUM CHLORIDE 0.9% FLUSH
10.0000 mL | INTRAVENOUS | Status: DC | PRN
  Filled 2018-05-15: qty 10

## 2018-05-15 MED ORDER — HEPARIN SOD (PORK) LOCK FLUSH 100 UNIT/ML IV SOLN
500.0000 [IU] | Freq: Once | INTRAVENOUS | Status: DC | PRN
  Filled 2018-05-15: qty 5

## 2018-05-15 MED ORDER — POTASSIUM CHLORIDE 2 MEQ/ML IV SOLN
Freq: Once | INTRAVENOUS | Status: AC
  Administered 2018-05-15: 09:00:00 via INTRAVENOUS
  Filled 2018-05-15: qty 10

## 2018-05-15 MED ORDER — PALONOSETRON HCL INJECTION 0.25 MG/5ML
0.2500 mg | Freq: Once | INTRAVENOUS | Status: AC
  Administered 2018-05-15: 0.25 mg via INTRAVENOUS

## 2018-05-15 MED ORDER — PALONOSETRON HCL INJECTION 0.25 MG/5ML
INTRAVENOUS | Status: AC
  Filled 2018-05-15: qty 5

## 2018-05-15 MED ORDER — SODIUM CHLORIDE 0.9 % IV SOLN
Freq: Once | INTRAVENOUS | Status: AC
  Administered 2018-05-15: 09:00:00 via INTRAVENOUS
  Filled 2018-05-15: qty 250

## 2018-05-15 MED ORDER — SODIUM CHLORIDE 0.9 % IV SOLN
40.0000 mg/m2 | Freq: Once | INTRAVENOUS | Status: AC
  Administered 2018-05-15: 68 mg via INTRAVENOUS
  Filled 2018-05-15: qty 68

## 2018-05-15 MED ORDER — SODIUM CHLORIDE 0.9 % IV SOLN
Freq: Once | INTRAVENOUS | Status: AC
  Administered 2018-05-15: 11:00:00 via INTRAVENOUS
  Filled 2018-05-15: qty 5

## 2018-05-15 NOTE — Patient Instructions (Signed)
[facility] Discharge Instructions for Patients Receiving Chemotherapy  Today you received the following chemotherapy agents cisplatin  To help prevent nausea and vomiting after your treatment, we encourage you to take your nausea medication  As directed If you develop nausea and vomiting that is not controlled by your nausea medication, call the clinic.   BELOW ARE SYMPTOMS THAT SHOULD BE REPORTED IMMEDIATELY:  *FEVER GREATER THAN 100.5 F  *CHILLS WITH OR WITHOUT FEVER  NAUSEA AND VOMITING THAT IS NOT CONTROLLED WITH YOUR NAUSEA MEDICATION  *UNUSUAL SHORTNESS OF BREATH  *UNUSUAL BRUISING OR BLEEDING  TENDERNESS IN MOUTH AND THROAT WITH OR WITHOUT PRESENCE OF ULCERS  *URINARY PROBLEMS  *BOWEL PROBLEMS  UNUSUAL RASH Items with * indicate a potential emergency and should be followed up as soon as possible.  Feel free to call the clinic should you have any questions or concerns. The clinic phone number is [phone].  Please show the [facility] at check-in to the Emergency Department and triage nurse.   Cisplatin injection What is this medicine? CISPLATIN (SIS pla tin) is a chemotherapy drug. It targets fast dividing cells, like cancer cells, and causes these cells to die. This medicine is used to treat many types of cancer like bladder, ovarian, and testicular cancers. This medicine may be used for other purposes; ask your health care provider or pharmacist if you have questions. COMMON BRAND NAME(S): Platinol, Platinol -AQ What should I tell my health care provider before I take this medicine? They need to know if you have any of these conditions: -blood disorders -hearing problems -kidney disease -recent or ongoing radiation therapy -an unusual or allergic reaction to cisplatin, carboplatin, other chemotherapy, other medicines, foods, dyes, or preservatives -pregnant or trying to get pregnant -breast-feeding How should I use this medicine? This  drug is given as an infusion into a vein. It is administered in a hospital or clinic by a specially trained health care professional. Talk to your pediatrician regarding the use of this medicine in children. Special care may be needed. Overdosage: If you think you have taken too much of this medicine contact a poison control center or emergency room at once. NOTE: This medicine is only for you. Do not share this medicine with others. What if I miss a dose? It is important not to miss a dose. Call your doctor or health care professional if you are unable to keep an appointment. What may interact with this medicine? -dofetilide -foscarnet -medicines for seizures -medicines to increase blood counts like filgrastim, pegfilgrastim, sargramostim -probenecid -pyridoxine used with altretamine -rituximab -some antibiotics like amikacin, gentamicin, neomycin, polymyxin B, streptomycin, tobramycin -sulfinpyrazone -vaccines -zalcitabine Talk to your doctor or health care professional before taking any of these medicines: -acetaminophen -aspirin -ibuprofen -ketoprofen -naproxen This list may not describe all possible interactions. Give your health care provider a list of all the medicines, herbs, non-prescription drugs, or dietary supplements you use. Also tell them if you smoke, drink alcohol, or use illegal drugs. Some items may interact with your medicine. What should I watch for while using this medicine? Your condition will be monitored carefully while you are receiving this medicine. You will need important blood work done while you are taking this medicine. This drug may make you feel generally unwell. This is not uncommon, as chemotherapy can affect healthy cells as well as cancer cells. Report any side effects. Continue your course of treatment even though you feel ill unless your doctor tells you to stop.  In some cases, you may be given additional medicines to help with side effects. Follow  all directions for their use. Call your doctor or health care professional for advice if you get a fever, chills or sore throat, or other symptoms of a cold or flu. Do not treat yourself. This drug decreases your body's ability to fight infections. Try to avoid being around people who are sick. This medicine may increase your risk to bruise or bleed. Call your doctor or health care professional if you notice any unusual bleeding. Be careful brushing and flossing your teeth or using a toothpick because you may get an infection or bleed more easily. If you have any dental work done, tell your dentist you are receiving this medicine. Avoid taking products that contain aspirin, acetaminophen, ibuprofen, naproxen, or ketoprofen unless instructed by your doctor. These medicines may hide a fever. Do not become pregnant while taking this medicine. Women should inform their doctor if they wish to become pregnant or think they might be pregnant. There is a potential for serious side effects to an unborn child. Talk to your health care professional or pharmacist for more information. Do not breast-feed an infant while taking this medicine. Drink fluids as directed while you are taking this medicine. This will help protect your kidneys. Call your doctor or health care professional if you get diarrhea. Do not treat yourself. What side effects may I notice from receiving this medicine? Side effects that you should report to your doctor or health care professional as soon as possible: -allergic reactions like skin rash, itching or hives, swelling of the face, lips, or tongue -signs of infection - fever or chills, cough, sore throat, pain or difficulty passing urine -signs of decreased platelets or bleeding - bruising, pinpoint red spots on the skin, black, tarry stools, nosebleeds -signs of decreased red blood cells - unusually weak or tired, fainting spells, lightheadedness -breathing problems -changes in  hearing -gout pain -low blood counts - This drug may decrease the number of white blood cells, red blood cells and platelets. You may be at increased risk for infections and bleeding. -nausea and vomiting -pain, swelling, redness or irritation at the injection site -pain, tingling, numbness in the hands or feet -problems with balance, movement -trouble passing urine or change in the amount of urine Side effects that usually do not require medical attention (report to your doctor or health care professional if they continue or are bothersome): -changes in vision -loss of appetite -metallic taste in the mouth or changes in taste This list may not describe all possible side effects. Call your doctor for medical advice about side effects. You may report side effects to FDA at 1-800-FDA-1088. Where should I keep my medicine? This drug is given in a hospital or clinic and will not be stored at home. NOTE: This sheet is a summary. It may not cover all possible information. If you have questions about this medicine, talk to your doctor, pharmacist, or health care provider.  2019 Elsevier/Gold Standard (2007-05-05 14:40:54)   Coronavirus (COVID-19) Are you at risk?  Are you at risk for the Coronavirus (COVID-19)?  To be considered HIGH RISK for Coronavirus (COVID-19), you have to meet the following criteria:  . Traveled to [place], [place], [place], [place] or [place]; or in the [place] to [place], [place], [place], or [place]; and have fever, cough, and shortness of breath within the last 2 weeks of travel OR . Been in close contact with  a person diagnosed with COVID-19 within the last 2 weeks and have fever, cough, and shortness of breath . IF YOU DO NOT MEET THESE CRITERIA, YOU ARE CONSIDERED LOW RISK FOR COVID-19.  What to do if you are HIGH RISK for COVID-19?  Marland Kitchen If you are having a medical emergency, call 911. . Seek medical care right away. Before you go to a doctor's  office, urgent care or emergency department, call ahead and tell them about your recent travel, contact with someone diagnosed with COVID-19, and your symptoms. You should receive instructions from your physician's office regarding next steps of care.  . When you arrive at healthcare provider, tell the healthcare staff immediately you have returned from visiting [place], [place], [place], [place] or [place]; or traveled in the [place] to [place], [place], [place], or [place]; in the last two weeks or you have been in close contact with a person diagnosed with COVID-19 in the last 2 weeks.   . Tell the health care staff about your symptoms: fever, cough and shortness of breath. . After you have been seen by a medical provider, you will be either: o Tested for (COVID-19) and discharged home on quarantine except to seek medical care if symptoms worsen, and asked to  - Stay home and avoid contact with others until you get your results (4-5 days)  - Avoid travel on public transportation if possible (such as bus, train, or airplane) or o Sent to the Emergency Department by EMS for evaluation, COVID-19 testing, and possible admission depending on your condition and test results.  What to do if you are LOW RISK for COVID-19?  Reduce your risk of any infection by using the same precautions used for avoiding the common cold or flu:  Marland Kitchen Wash your hands often with soap and warm water for at least 20 seconds.  If soap and water are not readily available, use an alcohol-based hand sanitizer with at least 60% alcohol.  . If coughing or sneezing, cover your mouth and nose by coughing or sneezing into the elbow areas of your shirt or coat, into a tissue or into your sleeve (not your hands). . Avoid shaking hands with others and consider head nods or verbal greetings only. . Avoid touching your eyes, nose, or mouth with unwashed hands.  . Avoid close contact with people who are sick. . Avoid places or  events with large numbers of people in one location, like concerts or sporting events. . Carefully consider travel plans you have or are making. . If you are planning any travel outside or inside the [place], visit the CDC's Travelers' Health webpage for the latest health notices. . If you have some symptoms but not all symptoms, continue to monitor at home and seek medical attention if your symptoms worsen. . If you are having a medical emergency, call 911.   [place] / e-Visit: [email or web address]         [facility]: [facility]: [phone]                   [facility]: [phone]

## 2018-05-18 ENCOUNTER — Ambulatory Visit: Payer: Self-pay | Admitting: Radiation Oncology

## 2018-05-18 ENCOUNTER — Ambulatory Visit
Admission: RE | Admit: 2018-05-18 | Discharge: 2018-05-18 | Disposition: A | Discharge status: Home / Self Care | Payer: Self-pay | Source: Ambulatory Visit | Attending: Radiation Oncology | Admitting: Radiation Oncology

## 2018-05-18 ENCOUNTER — Other Ambulatory Visit: Payer: Self-pay

## 2018-05-19 ENCOUNTER — Other Ambulatory Visit: Payer: Self-pay

## 2018-05-19 ENCOUNTER — Telehealth: Payer: Self-pay | Admitting: *Deleted

## 2018-05-19 ENCOUNTER — Ambulatory Visit
Admission: RE | Admit: 2018-05-19 | Discharge: 2018-05-19 | Disposition: A | Discharge status: Home / Self Care | Payer: Self-pay | Source: Ambulatory Visit | Attending: Radiation Oncology | Admitting: Radiation Oncology

## 2018-05-19 NOTE — Telephone Encounter (Signed)
-----   Message from Royston Bake, RN sent at 05/15/2018  4:00 PM EDT ----- Regarding: Dr. Alvy Bimler patient first treatment f/u call First treatment follow up call.

## 2018-05-19 NOTE — Telephone Encounter (Signed)
Called She[facility] for chemotherapy F/U.  Patient is doing well.  Denies vomiting.  No nausea today but did use Compazine yesterday and Sunday morning.  Denies further side effects or symptoms.  Bowel and bladder functioning well.  Eating and drinking well.  Instructed to drink 64 oz minimum daily or at least the day before, of and after treatment.   Reviewed anti-emetic use.  Denies further questions or needs at this time.  Encouraged to call [phone] Mon -Fri 8:00 am - 4:30 pm or anytime as needed for symptoms, changes or event outside office hours.

## 2018-05-20 ENCOUNTER — Inpatient Hospital Stay: Payer: Self-pay

## 2018-05-20 ENCOUNTER — Other Ambulatory Visit: Payer: Self-pay

## 2018-05-20 ENCOUNTER — Ambulatory Visit
Admission: RE | Admit: 2018-05-20 | Discharge: 2018-05-20 | Disposition: A | Discharge status: Home / Self Care | Payer: Self-pay | Source: Ambulatory Visit | Attending: Radiation Oncology | Admitting: Radiation Oncology

## 2018-05-20 DIAGNOSIS — C530 Malignant neoplasm of endocervix: Secondary | ICD-10-CM

## 2018-05-20 LAB — CBC WITH DIFFERENTIAL/PLATELET
Abs Immature Granulocytes: 0.02 10*3/uL (ref 0.00–0.07)
Basophils Absolute: 0 10*3/uL (ref 0.0–0.1)
Basophils Relative: 1 %
Eosinophils Absolute: 0.4 10*3/uL (ref 0.0–0.5)
Eosinophils Relative: 10 %
HCT: 34.1 % — ABNORMAL LOW (ref 36.0–46.0)
Hemoglobin: 11.2 g/dL — ABNORMAL LOW (ref 12.0–15.0)
Immature Granulocytes: 1 %
Lymphocytes Relative: 19 %
Lymphs Abs: 0.8 10*3/uL (ref 0.7–4.0)
MCH: 31 pg (ref 26.0–34.0)
MCHC: 32.8 g/dL (ref 30.0–36.0)
MCV: 94.5 fL (ref 80.0–100.0)
Monocytes Absolute: 0.3 10*3/uL (ref 0.1–1.0)
Monocytes Relative: 6 %
Neutro Abs: 2.7 10*3/uL (ref 1.7–7.7)
Neutrophils Relative %: 63 %
Platelets: 287 10*3/uL (ref 150–400)
RBC: 3.61 MIL/uL — ABNORMAL LOW (ref 3.87–5.11)
RDW: 12 % (ref 11.5–15.5)
WBC: 4.2 10*3/uL (ref 4.0–10.5)
nRBC: 0 % (ref 0.0–0.2)

## 2018-05-20 LAB — COMPREHENSIVE METABOLIC PANEL
ALT: 70 U/L — ABNORMAL HIGH (ref 0–44)
AST: 39 U/L (ref 15–41)
Albumin: 3.8 g/dL (ref 3.5–5.0)
Alkaline Phosphatase: 96 U/L (ref 38–126)
Anion gap: 8 (ref 5–15)
BUN: 13 mg/dL (ref 6–20)
CO2: 28 mmol/L (ref 22–32)
Calcium: 9.4 mg/dL (ref 8.9–10.3)
Chloride: 102 mmol/L (ref 98–111)
Creatinine, Ser: 0.79 mg/dL (ref 0.44–1.00)
GFR calc Af Amer: >60 mL/min (ref >60)
GFR calc non Af Amer: >60 mL/min (ref >60)
Glucose, Bld: 94 mg/dL (ref 70–99)
Potassium: 4.1 mmol/L (ref 3.5–5.1)
Sodium: 138 mmol/L (ref 135–145)
Total Bilirubin: 0.3 mg/dL (ref 0.3–1.2)
Total Protein: 7.2 g/dL (ref 6.5–8.1)

## 2018-05-20 LAB — MAGNESIUM: Magnesium: 2 mg/dL (ref 1.7–2.4)

## 2018-05-21 ENCOUNTER — Ambulatory Visit
Admission: RE | Admit: 2018-05-21 | Discharge: 2018-05-21 | Disposition: A | Discharge status: Home / Self Care | Payer: Self-pay | Source: Ambulatory Visit | Attending: Radiation Oncology | Admitting: Radiation Oncology

## 2018-05-21 ENCOUNTER — Encounter: Payer: Self-pay | Admitting: Hematology and Oncology

## 2018-05-21 ENCOUNTER — Inpatient Hospital Stay (HOSPITAL_BASED_OUTPATIENT_CLINIC_OR_DEPARTMENT_OTHER): Payer: Self-pay | Admitting: Hematology and Oncology

## 2018-05-21 ENCOUNTER — Other Ambulatory Visit: Payer: Self-pay

## 2018-05-21 DIAGNOSIS — C530 Malignant neoplasm of endocervix: Secondary | ICD-10-CM

## 2018-05-21 DIAGNOSIS — T451X5A Adverse effect of antineoplastic and immunosuppressive drugs, initial encounter: Secondary | ICD-10-CM

## 2018-05-21 DIAGNOSIS — K5909 Other constipation: Secondary | ICD-10-CM

## 2018-05-21 DIAGNOSIS — R110 Nausea: Secondary | ICD-10-CM | POA: Insufficient documentation

## 2018-05-21 DIAGNOSIS — R748 Abnormal levels of other serum enzymes: Secondary | ICD-10-CM | POA: Insufficient documentation

## 2018-05-21 DIAGNOSIS — G893 Neoplasm related pain (acute) (chronic): Secondary | ICD-10-CM

## 2018-05-21 NOTE — Assessment & Plan Note (Signed)
She has mild intermittent constipation alternating with diarrhea She will continue to adjust her laxative as needed

## 2018-05-21 NOTE — Assessment & Plan Note (Signed)
Her pain is well controlled with Tylenol as well as ir morphine as needed She will continue the same

## 2018-05-21 NOTE — Progress Notes (Signed)
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[facility]) She has minimum pain, some nausea and fatigue She will proceed with radiation therapy and chemotherapy as scheduled I will continue to see her on a weekly basis for supportive care  Cancer associated pain Her pain is well controlled with Tylenol as well as ir morphine as needed She will continue the same  Other constipation She has mild intermittent constipation alternating with diarrhea She will continue to adjust her laxative as needed  Chemotherapy-induced nausea She will continue antiemetics and ginger candy as tolerated  Elevated liver enzymes This could be related to recent alcohol intake Observe only   No orders of the defined types were placed in this encounter.   INTERVAL HISTORY: Please see below for problem oriented charting. She returns to be seen prior to cycle 2 of treatment She has some mild nausea, constipation alternating with diarrhea and suprapubic discomfort No peripheral neuropathy Denies recent infection, fever or chills Overall, she tolerated treatment fairly well  SUMMARY OF ONCOLOGIC HISTORY:   Malignant neoplasm of endocervix ([place])   09/15/2017 Initial Diagnosis    The patient reports postcoital bleeding since August, 2019 and postmenopausal bleeding since November, 2019. 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     04/16/2018 Surgery    She was taken to the OR on 04/16/18 for a cervical cone biopsy. The specimen revealed a grossly normal cervix on the ectocervix.  Cone specimen revealed squamous cell carcinoma.  When discussing this with the pathologist he reported that in the actual cone specimen itself that was CIN 3 however there was a separate fragment of tissue that included  carcinoma.  The dimensions of this was 0.6 cm, however this involved the margins.  A post cone ECC was positive for squamous cell carcinoma, as well as an endometrial curettage which also contain benign endometrial glands.     04/16/2018 Pathology Results    Endocervix curettage: [facility]    04/22/2018 Initial Diagnosis    Malignant neoplasm of endocervix ([place])    04/24/2018 PET scan    1. There is a large mass involving the cervix and endometrium which has a maximum dimension of 5.3 cm within SUV max of 20.4. 2. Small bilateral pelvic sidewall lymph nodes measuring less than 1cm with mild nonspecific uptake. The no hypermetabolic adenopathy or evidence of distant metastatic disease. 3. Nonspecific pulmonary nodules measuring less than 5 mm are identified in the right lung. Too small to characterize by PET-CT.     04/27/2018 Imaging    MRI pelvis  1. Uterine cervix 5.5 x 3.4 x 3.2 cm mass compatible with primary cervical malignancy, with mild parametrial invasion. Stage IIB by MRI. 2. Small volume simple free fluid in the pelvic cul-de-sac. 3. No pathologically enlarged pelvic lymph nodes, see comments.    05/08/2018 Procedure    Placement of a subcutaneous port device. Catheter tip at the superior cavoatrial junction.    05/15/2018 -  Chemotherapy    The patient had weekly cisplatin     REVIEW OF SYSTEMS:   Constitutional: Denies fevers, chills or abnormal weight loss Eyes: Denies blurriness of vision Ears, nose, mouth, throat, and face: Denies mucositis or sore throat Respiratory: Denies cough, dyspnea or wheezes Cardiovascular: Denies palpitation, chest discomfort or lower  extremity swelling Skin: Denies abnormal skin rashes Lymphatics: Denies new lymphadenopathy or easy bruising Neurological:Denies numbness, tingling or new weaknesses Behavioral/Psych: Mood is stable, no new changes  All other systems were reviewed with the patient and are negative.  I have reviewed the past medical  history, past surgical history, social history and family history with the patient and they are unchanged from previous note.  ALLERGIES:  has No Known Allergies.  MEDICATIONS:  Current Outpatient Medications  Medication Sig Dispense Refill  . acetaminophen (TYLENOL) 500 MG tablet Take 1,000 mg by mouth every 8 (eight) hours as needed.    . lidocaine-prilocaine (EMLA) cream Apply to affected area once 30 g 3  . morphine (MSIR) 15 MG tablet Take 1 tablet (15 mg total) by mouth every 6 (six) hours as needed for severe pain. 30 tablet 0  . ondansetron (ZOFRAN) 8 MG tablet Take 1 tablet (8 mg total) by mouth every 8 (eight) hours as needed. Start on the third day after chemotherapy. 30 tablet 1  . polyethylene glycol (MIRALAX / GLYCOLAX) packet Take 17 g by mouth daily.    . prochlorperazine (COMPAZINE) 10 MG tablet Take 1 tablet (10 mg total) by mouth every 6 (six) hours as needed (Nausea or vomiting). 30 tablet 1  . SUMAtriptan (IMITREX) 100 MG tablet Take 1 tablet (100 mg total) by mouth once for 1 dose. May repeat in 2 hours if no relief. 10 tablet 11   No current facility-administered medications for this visit.     PHYSICAL EXAMINATION: ECOG PERFORMANCE STATUS: 1 - Symptomatic but completely ambulatory  Vitals:   05/21/18 1216  BP: 120/81  Pulse: 78  Resp: 18  Temp: 98.2 F (36.8 C)  SpO2: 100%   Filed Weights   05/21/18 1216  Weight: 145 lb 9.6 oz (66 kg)    GENERAL:alert, no distress and comfortable SKIN: skin color, texture, turgor are normal, no rashes or significant lesions EYES: normal, Conjunctiva are pink and non-injected, sclera clear OROPHARYNX:no exudate, no erythema and lips, buccal mucosa, and tongue normal  NECK: supple, thyroid normal size, non-tender, without nodularity LYMPH:  no palpable lymphadenopathy in the cervical, axillary or inguinal LUNGS: clear to auscultation and percussion with normal breathing effort HEART: regular rate & rhythm and no murmurs  and no lower extremity edema ABDOMEN:abdomen soft, non-tender and normal bowel sounds Musculoskeletal:no cyanosis of digits and no clubbing  NEURO: alert & oriented x 3 with fluent speech, no focal motor/sensory deficits  LABORATORY DATA:  I have reviewed the data as listed    Component Value Date/Time   NA 138 05/20/2018 1206   K 4.1 05/20/2018 1206   CL 102 05/20/2018 1206   CO2 28 05/20/2018 1206   GLUCOSE 94 05/20/2018 1206   BUN 13 05/20/2018 1206   CREATININE 0.79 05/20/2018 1206   CALCIUM 9.4 05/20/2018 1206   PROT 7.2 05/20/2018 1206   ALBUMIN 3.8 05/20/2018 1206   AST 39 05/20/2018 1206   ALT 70 (H) 05/20/2018 1206   ALKPHOS 96 05/20/2018 1206   BILITOT 0.3 05/20/2018 1206   GFRNONAA >60 05/20/2018 1206   GFRAA >60 05/20/2018 1206    No results found for: SPEP, UPEP  Lab Results  Component Value Date   WBC 4.2 05/20/2018   NEUTROABS 2.7 05/20/2018   HGB 11.2 (L) 05/20/2018   HCT 34.1 (L) 05/20/2018   MCV 94.5 05/20/2018   PLT 287 05/20/2018      Chemistry      Component Value Date/Time  NA 138 05/20/2018 1206   K 4.1 05/20/2018 1206   CL 102 05/20/2018 1206   CO2 28 05/20/2018 1206   BUN 13 05/20/2018 1206   CREATININE 0.79 05/20/2018 1206      Component Value Date/Time   CALCIUM 9.4 05/20/2018 1206   ALKPHOS 96 05/20/2018 1206   AST 39 05/20/2018 1206   ALT 70 (H) 05/20/2018 1206   BILITOT 0.3 05/20/2018 1206       RADIOGRAPHIC STUDIES: I have personally reviewed the radiological images as listed and agreed with the findings in the report. Mr Pelvis W Wo Contrast  Result Date: 04/27/2018 CLINICAL DATA:  Clinical stage IB cervical cancer. Pelvic staging evaluation. EXAM: MRI PELVIS WITHOUT AND WITH CONTRAST TECHNIQUE: Multiplanar multisequence MR imaging of the pelvis was performed both before and after administration of intravenous contrast. CONTRAST:  7.5 cc Gadavist IV. COMPARISON:  04/24/2018 PET-CT. FINDINGS: Urinary Tract:  Normal  bladder.  Normal urethra. Bowel: Visualized small and large bowel are normal caliber with no bowel wall thickening. Vascular/Lymphatic: No pathologically enlarged lymph nodes in the pelvis. Bilateral pelvic sidewall (external iliac) rounded subcentimeter lymph nodes measuring 0.6 cm on the right (series 17/image 8) and 0.6 cm on the left (series 17/image 10), unchanged from 04/24/2018 PET-CT. Reproductive: Uterus: The anteverted uterus measures 8.9 x 4.8 x 5.2 cm. No uterine fibroids. Inner myometrium (junctional zone) measures 12 mm in thickness, which is compatible with mild diffuse adenomyosis. Endometrium measures 5 mm in bilayer thickness. There is a T2 hyperintense 5.5 x 3.4 x 3.2 cm mass in the uterine cervix (series 5/image 16). This mass invades the lower uterine body. No clear invasion of the vagina by the mass. There is loss of the normal T2 hypointense outer cervical stroma in multiple locations, including along the posterior upper left cervix (series 4/image 19 and series 5/image 17) and lateral lower cervix (series 4/images 21 and 23), compatible with mild parametrial invasion. No direct invasion of colon or bladder. No extension to the pelvic sidewall. No hydronephrosis on limited views of the kidneys. Ovaries and Adnexa: The right ovary measures 2.3 x 1.4 x 1.0 cm and is normal. The left ovary measures 2.7 x 1.1 x 1.4 cm and is normal. There are no suspicious ovarian or adnexal masses. Other: Small volume simple free fluid in the pelvic cul-de-sac. No focal pelvic fluid collection. Musculoskeletal: No aggressive appearing focal osseous lesions. Moderate degenerative disc disease at L4-5. IMPRESSION: 1. Uterine cervix 5.5 x 3.4 x 3.2 cm mass compatible with primary cervical malignancy, with mild parametrial invasion. Stage IIB by MRI. 2. Small volume simple free fluid in the pelvic cul-de-sac. 3. No pathologically enlarged pelvic lymph nodes, see comments. Electronically Signed   By: Ilona Sorrel M.D.    On: 04/27/2018 13:05   Nm Pet Image Initial (pi) Skull Base To Thigh  Result Date: 04/24/2018 CLINICAL DATA:  Initial treatment strategy for cervical cancer. EXAM: NUCLEAR MEDICINE PET SKULL BASE TO THIGH TECHNIQUE: 7.66 mCi F-18 FDG was injected intravenously. Full-ring PET imaging was performed from the skull base to thigh after the radiotracer. CT data was obtained and used for attenuation correction and anatomic localization. Fasting blood glucose: 89 mg/dl COMPARISON:  None FINDINGS: Mediastinal blood pool activity: SUV max 3.04. NECK: No hypermetabolic lymph nodes in the neck. Incidental CT findings: none CHEST: No hypermetabolic axillary or supraclavicular adenopathy. No hypermetabolic mediastinal or hilar adenopathy identified. Tiny noncalcified solid nodule in the right upper lobe is too small to characterize measuring 2  mm, image 25/8. Within the right middle lobe there is a small round dense nodule measuring 3 mm. Also too small to characterize. Incidental CT findings: None ABDOMEN/PELVIS: No abnormal radiotracer activity within the liver. No abnormal uptake within the spleen, pancreas, adrenal glands. No hypermetabolic abdominal adenopathy. No hypermetabolic pelvic or inguinal lymph nodes. 7 mm right external iliac lymph node is identified, image 159/4. There is mild uptake within this node with SUV max of 2.5. Left pelvic sidewall lymph node measures 6 mm and has an SUV max of 2.19. Hypermetabolic mass involving the cervix and uterine cavity is identified this measures 4.7 by 3.6 by 5.3 cm and has an SUV max of 20.4. Incidental CT findings: none SKELETON: No focal hypermetabolic activity to suggest skeletal metastasis. Incidental CT findings: none IMPRESSION: 1. There is a large mass involving the cervix and endometrium which has a maximum dimension of 5.3 cm within SUV max of 20.4. 2. Small bilateral pelvic sidewall lymph nodes measuring less than 1 cm with mild nonspecific uptake. The no  hypermetabolic adenopathy or evidence of distant metastatic disease. 3. Nonspecific pulmonary nodules measuring less than 5 mm are identified in the right lung. Too small to characterize by PET-CT. Electronically Signed   By: Kerby Moors M.D.   On: 04/24/2018 16:08   Ir Imaging Guided Port Insertion  Result Date: 05/08/2018 INDICATION: 52 year old with cervical cancer. Port-A-Cath needed for chemotherapy. EXAM: FLUOROSCOPIC AND ULTRASOUND GUIDED PLACEMENT OF A SUBCUTANEOUS PORT COMPARISON:  None. MEDICATIONS: Ancef 2 g; The antibiotic was administered within an appropriate time interval prior to skin puncture. ANESTHESIA/SEDATION: Versed 3.0 mg IV; Fentanyl 100 mcg IV; Moderate Sedation Time:  40 minutes The patient was continuously monitored during the procedure by the interventional radiology nurse under my direct supervision. FLUOROSCOPY TIME:  30 seconds, 4 mGy COMPLICATIONS: None immediate. PROCEDURE: The procedure, risks, benefits, and alternatives were explained to the patient. Questions regarding the procedure were encouraged and answered. The patient understands and consents to the procedure. Patient was placed supine on the interventional table. Ultrasound confirmed a patent right internal jugular vein. Ultrasound image was saved for documentation. The right chest and neck were cleaned with a skin antiseptic and a sterile drape was placed. Maximal barrier sterile technique was utilized including caps, mask, sterile gowns, sterile gloves, sterile drape, hand hygiene and skin antiseptic. The right neck was anesthetized with 1% lidocaine. Small incision was made in the right neck with a blade. Micropuncture set was placed in the right internal jugular vein with ultrasound guidance. The micropuncture wire was used for measurement purposes. The right chest was anesthetized with 1% lidocaine with epinephrine. #15 blade was used to make an incision and a subcutaneous port pocket was formed. [place] was assembled. Subcutaneous tunnel was formed with a stiff tunneling device. The port catheter was brought through the subcutaneous tunnel. The port was placed in the subcutaneous pocket and sutured in place. The micropuncture set was exchanged for a peel-away sheath. The catheter was placed through the peel-away sheath and the tip was positioned at the SVC/right atrium junction. Catheter placement was confirmed with fluoroscopy. The port was accessed and flushed with heparinized saline. The port pocket was closed using two layers of absorbable sutures and Dermabond. The vein skin site was closed using a single layer of absorbable suture and Dermabond. Sterile dressings were applied. Patient tolerated the procedure well without an immediate complication. Ultrasound and fluoroscopic images were taken and saved for this procedure. IMPRESSION: Placement of  a subcutaneous port device. Catheter tip at the superior cavoatrial junction. Electronically Signed   By: Markus Daft M.D.   On: 05/08/2018 13:05    All questions were answered. The patient knows to call the clinic with any problems, questions or concerns. No barriers to learning was detected.  I spent 15 minutes counseling the patient face to face. The total time spent in the appointment was 20 minutes and more than 50% was on counseling and review of test results  Heath Lark, MD 05/21/2018 12:45 PM

## 2018-05-21 NOTE — Assessment & Plan Note (Signed)
She has minimum pain, some nausea and fatigue She will proceed with radiation therapy and chemotherapy as scheduled I will continue to see her on a weekly basis for supportive care

## 2018-05-21 NOTE — Assessment & Plan Note (Signed)
This could be related to recent alcohol intake Observe only

## 2018-05-21 NOTE — Assessment & Plan Note (Signed)
She will continue antiemetics and ginger candy as tolerated

## 2018-05-22 ENCOUNTER — Inpatient Hospital Stay: Payer: Self-pay

## 2018-05-22 ENCOUNTER — Ambulatory Visit
Admission: RE | Admit: 2018-05-22 | Discharge: 2018-05-22 | Disposition: A | Discharge status: Home / Self Care | Payer: Self-pay | Source: Ambulatory Visit | Attending: Radiation Oncology | Admitting: Radiation Oncology

## 2018-05-22 ENCOUNTER — Ambulatory Visit: Payer: Self-pay

## 2018-05-22 ENCOUNTER — Other Ambulatory Visit: Payer: Self-pay

## 2018-05-22 VITALS — BP 125/79 | HR 66 | Temp 97.6°F | Resp 16

## 2018-05-22 DIAGNOSIS — C530 Malignant neoplasm of endocervix: Secondary | ICD-10-CM

## 2018-05-22 MED ORDER — SODIUM CHLORIDE 0.9% FLUSH
10.0000 mL | INTRAVENOUS | Status: DC | PRN
  Administered 2018-05-22: 10 mL
  Filled 2018-05-22: qty 10

## 2018-05-22 MED ORDER — HEPARIN SOD (PORK) LOCK FLUSH 100 UNIT/ML IV SOLN
500.0000 [IU] | Freq: Once | INTRAVENOUS | Status: AC | PRN
  Administered 2018-05-22: 500 [IU]
  Filled 2018-05-22: qty 5

## 2018-05-22 MED ORDER — SODIUM CHLORIDE 0.9 % IV SOLN
Freq: Once | INTRAVENOUS | Status: AC
  Administered 2018-05-22: 10:00:00 via INTRAVENOUS
  Filled 2018-05-22: qty 5

## 2018-05-22 MED ORDER — PALONOSETRON HCL INJECTION 0.25 MG/5ML
INTRAVENOUS | Status: AC
  Filled 2018-05-22: qty 5

## 2018-05-22 MED ORDER — POTASSIUM CHLORIDE 2 MEQ/ML IV SOLN
Freq: Once | INTRAVENOUS | Status: AC
  Administered 2018-05-22: 09:00:00 via INTRAVENOUS
  Filled 2018-05-22: qty 10

## 2018-05-22 MED ORDER — SODIUM CHLORIDE 0.9 % IV SOLN
40.0000 mg/m2 | Freq: Once | INTRAVENOUS | Status: AC
  Administered 2018-05-22: 68 mg via INTRAVENOUS
  Filled 2018-05-22: qty 68

## 2018-05-22 MED ORDER — PALONOSETRON HCL INJECTION 0.25 MG/5ML
0.2500 mg | Freq: Once | INTRAVENOUS | Status: AC
  Administered 2018-05-22: 0.25 mg via INTRAVENOUS

## 2018-05-22 MED ORDER — SODIUM CHLORIDE 0.9 % IV SOLN
Freq: Once | INTRAVENOUS | Status: AC
  Administered 2018-05-22: 09:00:00 via INTRAVENOUS
  Filled 2018-05-22: qty 250

## 2018-05-22 NOTE — Progress Notes (Signed)
Patient c/o discomfort at [facility] site when she moves right arm. On visual assessment, noted no concerns. +blood return and flushes easily with 10 mL NS. Patient denies increased discomfort with aspiration or flushing. No resistance noted on IV pump. Offered to deaccess and reaccess. Patient declined offer. Encouraged patient to notify RN if symptoms change or worsen. She verbalized understanding.

## 2018-05-22 NOTE — Patient Instructions (Signed)
[facility] Discharge Instructions for Patients Receiving Chemotherapy  Today you received the following chemotherapy agents Cisplatin  To help prevent nausea and vomiting after your treatment, we encourage you to take your nausea medication as directed  If you develop nausea and vomiting that is not controlled by your nausea medication, call the clinic.   BELOW ARE SYMPTOMS THAT SHOULD BE REPORTED IMMEDIATELY:  *FEVER GREATER THAN 100.5 F  *CHILLS WITH OR WITHOUT FEVER  NAUSEA AND VOMITING THAT IS NOT CONTROLLED WITH YOUR NAUSEA MEDICATION  *UNUSUAL SHORTNESS OF BREATH  *UNUSUAL BRUISING OR BLEEDING  TENDERNESS IN MOUTH AND THROAT WITH OR WITHOUT PRESENCE OF ULCERS  *URINARY PROBLEMS  *BOWEL PROBLEMS  UNUSUAL RASH Items with * indicate a potential emergency and should be followed up as soon as possible.  Feel free to call the clinic should you have any questions or concerns. The clinic phone number is [phone].  Please show the CHEMO ALERT CARD at check-in to the Emergency Department and triage nurse.   

## 2018-05-24 ENCOUNTER — Ambulatory Visit: Payer: Self-pay

## 2018-05-25 ENCOUNTER — Telehealth: Payer: Self-pay | Admitting: Oncology

## 2018-05-25 ENCOUNTER — Ambulatory Visit
Admission: RE | Admit: 2018-05-25 | Discharge: 2018-05-25 | Disposition: A | Discharge status: Home / Self Care | Payer: Self-pay | Source: Ambulatory Visit | Attending: Radiation Oncology | Admitting: Radiation Oncology

## 2018-05-25 ENCOUNTER — Other Ambulatory Visit: Payer: Self-pay

## 2018-05-25 NOTE — Telephone Encounter (Signed)
Sheena Torres called and said that Saturday night her cat almost knocked over a plant and she reached and jerked her right arm suddenly to stop the cat. She said she felt something pull in her right neck and it has been sore.  She is worried that her port was pulled and said the tubing looks a little more pronounced in her right neck.  She is wondering what to do. Advised her that Dr. Alvy Bimler will be notified and to take tylenol for the soreness as needed.

## 2018-05-25 NOTE — Telephone Encounter (Signed)
Opened in error

## 2018-05-26 ENCOUNTER — Ambulatory Visit
Admission: RE | Admit: 2018-05-26 | Discharge: 2018-05-26 | Disposition: A | Discharge status: Home / Self Care | Payer: Self-pay | Source: Ambulatory Visit | Attending: Radiation Oncology | Admitting: Radiation Oncology

## 2018-05-26 ENCOUNTER — Other Ambulatory Visit: Payer: Self-pay

## 2018-05-26 NOTE — Telephone Encounter (Signed)
I recommend ice pack and tylenol 1000 mg TID PO until I see her on Thursday

## 2018-05-26 NOTE — Telephone Encounter (Signed)
Called Brenn and advised her of message from Dr. Alvy Bimler.  She verbalized agreement and said it feels better today.

## 2018-05-27 ENCOUNTER — Ambulatory Visit
Admission: RE | Admit: 2018-05-27 | Discharge: 2018-05-27 | Disposition: A | Discharge status: Home / Self Care | Payer: Self-pay | Source: Ambulatory Visit | Attending: Radiation Oncology | Admitting: Radiation Oncology

## 2018-05-27 ENCOUNTER — Inpatient Hospital Stay: Payer: Self-pay

## 2018-05-27 ENCOUNTER — Telehealth: Payer: Self-pay | Admitting: Oncology

## 2018-05-27 ENCOUNTER — Other Ambulatory Visit: Payer: Self-pay

## 2018-05-27 DIAGNOSIS — C530 Malignant neoplasm of endocervix: Secondary | ICD-10-CM

## 2018-05-27 LAB — COMPREHENSIVE METABOLIC PANEL
ALT: 29 U/L (ref 0–44)
AST: 17 U/L (ref 15–41)
Albumin: 4 g/dL (ref 3.5–5.0)
Alkaline Phosphatase: 96 U/L (ref 38–126)
Anion gap: 13 (ref 5–15)
BUN: 13 mg/dL (ref 6–20)
CO2: 23 mmol/L (ref 22–32)
Calcium: 9.2 mg/dL (ref 8.9–10.3)
Chloride: 102 mmol/L (ref 98–111)
Creatinine, Ser: 0.79 mg/dL (ref 0.44–1.00)
GFR calc Af Amer: >60 mL/min (ref >60)
GFR calc non Af Amer: >60 mL/min (ref >60)
Glucose, Bld: 113 mg/dL — ABNORMAL HIGH (ref 70–99)
Potassium: 3.6 mmol/L (ref 3.5–5.1)
Sodium: 138 mmol/L (ref 135–145)
Total Bilirubin: 0.2 mg/dL — ABNORMAL LOW (ref 0.3–1.2)
Total Protein: 7.5 g/dL (ref 6.5–8.1)

## 2018-05-27 LAB — CBC WITH DIFFERENTIAL/PLATELET
Abs Immature Granulocytes: 0.01 10*3/uL (ref 0.00–0.07)
Basophils Absolute: 0 10*3/uL (ref 0.0–0.1)
Basophils Relative: 1 %
Eosinophils Absolute: 0.4 10*3/uL (ref 0.0–0.5)
Eosinophils Relative: 10 %
HCT: 34.3 % — ABNORMAL LOW (ref 36.0–46.0)
Hemoglobin: 11.4 g/dL — ABNORMAL LOW (ref 12.0–15.0)
Immature Granulocytes: 0 %
Lymphocytes Relative: 12 %
Lymphs Abs: 0.5 10*3/uL — ABNORMAL LOW (ref 0.7–4.0)
MCH: 31 pg (ref 26.0–34.0)
MCHC: 33.2 g/dL (ref 30.0–36.0)
MCV: 93.2 fL (ref 80.0–100.0)
Monocytes Absolute: 0.3 10*3/uL (ref 0.1–1.0)
Monocytes Relative: 6 %
Neutro Abs: 2.9 10*3/uL (ref 1.7–7.7)
Neutrophils Relative %: 71 %
Platelets: 225 10*3/uL (ref 150–400)
RBC: 3.68 MIL/uL — ABNORMAL LOW (ref 3.87–5.11)
RDW: 11.8 % (ref 11.5–15.5)
WBC: 4.1 10*3/uL (ref 4.0–10.5)
nRBC: 0 % (ref 0.0–0.2)

## 2018-05-27 LAB — MAGNESIUM: Magnesium: 1.8 mg/dL (ref 1.7–2.4)

## 2018-05-27 NOTE — Telephone Encounter (Signed)
Sheena Torres called and asked if her radiation appointment for tomorrow could be moved closer to her appointment with Dr. Alvy Bimler.  Called her back and left her a message advising her that the machine is double booked at that time but to go ahead an check in.  They will try to work her in earlier.

## 2018-05-28 ENCOUNTER — Other Ambulatory Visit: Payer: Self-pay

## 2018-05-28 ENCOUNTER — Encounter: Payer: Self-pay | Admitting: Hematology and Oncology

## 2018-05-28 ENCOUNTER — Inpatient Hospital Stay (HOSPITAL_BASED_OUTPATIENT_CLINIC_OR_DEPARTMENT_OTHER): Payer: Self-pay | Admitting: Hematology and Oncology

## 2018-05-28 ENCOUNTER — Ambulatory Visit
Admission: RE | Admit: 2018-05-28 | Discharge: 2018-05-28 | Disposition: A | Discharge status: Home / Self Care | Payer: Self-pay | Source: Ambulatory Visit | Attending: Radiation Oncology | Admitting: Radiation Oncology

## 2018-05-28 DIAGNOSIS — R197 Diarrhea, unspecified: Secondary | ICD-10-CM

## 2018-05-28 DIAGNOSIS — C530 Malignant neoplasm of endocervix: Secondary | ICD-10-CM

## 2018-05-28 DIAGNOSIS — R110 Nausea: Secondary | ICD-10-CM

## 2018-05-28 DIAGNOSIS — G893 Neoplasm related pain (acute) (chronic): Secondary | ICD-10-CM

## 2018-05-28 DIAGNOSIS — T451X5A Adverse effect of antineoplastic and immunosuppressive drugs, initial encounter: Secondary | ICD-10-CM

## 2018-05-28 NOTE — Progress Notes (Signed)
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  She will continue over-the-counter analgesics and morphine as needed  Chemotherapy-induced nausea We discussed daily antiemetics before radiation treatment  Diarrhea She has frequent diarrhea likely secondary to side effects of treatment We discussed hydration and Imodium as needed She has stopped taking laxatives   No orders of the defined types were placed in this encounter.   INTERVAL HISTORY: Please see below for problem oriented charting. She returns for her weekly follow-up Since last time I saw her, constipation has resolved In fact, she is having diarrhea that is frequent She has minimum pain She has some nausea but no vomiting No recent infection, fever or chills She denies hearing difficulties or neuropathy from treatment  SUMMARY OF ONCOLOGIC HISTORY:   Malignant neoplasm of endocervix ([place])   09/15/2017 Initial Diagnosis    The patient reports postcoital bleeding since August, 2019 and postmenopausal bleeding since November, 2019. 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     04/16/2018 Surgery    She was taken to the OR on 04/16/18 for a cervical cone biopsy. The specimen revealed a grossly normal cervix on the ectocervix.  Cone specimen revealed squamous cell carcinoma.  When discussing this with the pathologist he reported that in the actual cone specimen itself that was CIN 3 however there was a separate fragment of tissue that included carcinoma.  The dimensions of this was 0.6 cm, however  this involved the margins.  A post cone ECC was positive for squamous cell carcinoma, as well as an endometrial curettage which also contain benign endometrial glands.     04/16/2018 Pathology Results    Endocervix curettage: [facility]    04/22/2018 Initial Diagnosis    Malignant neoplasm of endocervix ([place])    04/24/2018 PET scan    1. There is a large mass involving the cervix and endometrium which has a maximum dimension of 5.3 cm within SUV max of 20.4. 2. Small bilateral pelvic sidewall lymph nodes measuring less than 1cm with mild nonspecific uptake. The no hypermetabolic adenopathy or evidence of distant metastatic disease. 3. Nonspecific pulmonary nodules measuring less than 5 mm are identified in the right lung. Too small to characterize by PET-CT.     04/27/2018 Imaging    MRI pelvis  1. Uterine cervix 5.5 x 3.4 x 3.2 cm mass compatible with primary cervical malignancy, with mild parametrial invasion. Stage IIB by MRI. 2. Small volume simple free fluid in the pelvic cul-de-sac. 3. No pathologically enlarged pelvic lymph nodes, see comments.    05/08/2018 Procedure    Placement of a subcutaneous port device. Catheter tip at the superior cavoatrial junction.    05/15/2018 -  Chemotherapy    The patient had weekly cisplatin     REVIEW OF SYSTEMS:   Constitutional: Denies fevers, chills or abnormal weight loss Eyes: Denies blurriness of vision Ears, nose, mouth, throat, and face: Denies mucositis or sore throat Respiratory: Denies cough, dyspnea or wheezes Cardiovascular: Denies palpitation, chest discomfort or lower extremity swelling Skin: Denies abnormal skin rashes Lymphatics: Denies new lymphadenopathy or  easy bruising Neurological:Denies numbness, tingling or new weaknesses Behavioral/Psych: Mood is stable, no new changes  All other systems were reviewed with the patient and are negative.  I have reviewed the past medical history, past surgical history, social history and  family history with the patient and they are unchanged from previous note.  ALLERGIES:  has No Known Allergies.  MEDICATIONS:  Current Outpatient Medications  Medication Sig Dispense Refill  . acetaminophen (TYLENOL) 500 MG tablet Take 1,000 mg by mouth every 8 (eight) hours as needed.    . lidocaine-prilocaine (EMLA) cream Apply to affected area once 30 g 3  . morphine (MSIR) 15 MG tablet Take 1 tablet (15 mg total) by mouth every 6 (six) hours as needed for severe pain. 30 tablet 0  . ondansetron (ZOFRAN) 8 MG tablet Take 1 tablet (8 mg total) by mouth every 8 (eight) hours as needed. Start on the third day after chemotherapy. 30 tablet 1  . polyethylene glycol (MIRALAX / GLYCOLAX) packet Take 17 g by mouth daily.    . prochlorperazine (COMPAZINE) 10 MG tablet Take 1 tablet (10 mg total) by mouth every 6 (six) hours as needed (Nausea or vomiting). 30 tablet 1  . SUMAtriptan (IMITREX) 100 MG tablet Take 1 tablet (100 mg total) by mouth once for 1 dose. May repeat in 2 hours if no relief. 10 tablet 11   No current facility-administered medications for this visit.     PHYSICAL EXAMINATION: ECOG PERFORMANCE STATUS: 1 - Symptomatic but completely ambulatory  Vitals:   05/28/18 1240  BP: 115/80  Pulse: 90  Resp: 18  Temp: 98.6 F (37 C)  SpO2: 100%   Filed Weights   05/28/18 1240  Weight: 145 lb 9.6 oz (66 kg)    GENERAL:alert, no distress and comfortable SKIN: skin color, texture, turgor are normal, no rashes or significant lesions EYES: normal, Conjunctiva are pink and non-injected, sclera clear OROPHARYNX:no exudate, no erythema and lips, buccal mucosa, and tongue normal  NECK: supple, thyroid normal size, non-tender, without nodularity LYMPH:  no palpable lymphadenopathy in the cervical, axillary or inguinal LUNGS: clear to auscultation and percussion with normal breathing effort HEART: regular rate & rhythm and no murmurs and no lower extremity edema ABDOMEN:abdomen soft,  non-tender and normal bowel sounds Musculoskeletal:no cyanosis of digits and no clubbing  NEURO: alert & oriented x 3 with fluent speech, no focal motor/sensory deficits  LABORATORY DATA:  I have reviewed the data as listed    Component Value Date/Time   NA 138 05/27/2018 1347   K 3.6 05/27/2018 1347   CL 102 05/27/2018 1347   CO2 23 05/27/2018 1347   GLUCOSE 113 (H) 05/27/2018 1347   BUN 13 05/27/2018 1347   CREATININE 0.79 05/27/2018 1347   CALCIUM 9.2 05/27/2018 1347   PROT 7.5 05/27/2018 1347   ALBUMIN 4.0 05/27/2018 1347   AST 17 05/27/2018 1347   ALT 29 05/27/2018 1347   ALKPHOS 96 05/27/2018 1347   BILITOT 0.2 (L) 05/27/2018 1347   GFRNONAA >60 05/27/2018 1347   GFRAA >60 05/27/2018 1347    No results found for: SPEP, UPEP  Lab Results  Component Value Date   WBC 4.1 05/27/2018   NEUTROABS 2.9 05/27/2018   HGB 11.4 (L) 05/27/2018   HCT 34.3 (L) 05/27/2018   MCV 93.2 05/27/2018   PLT 225 05/27/2018      Chemistry      Component Value Date/Time   NA 138 05/27/2018 1347   K 3.6 05/27/2018  1347   CL 102 05/27/2018 1347   CO2 23 05/27/2018 1347   BUN 13 05/27/2018 1347   CREATININE 0.79 05/27/2018 1347      Component Value Date/Time   CALCIUM 9.2 05/27/2018 1347   ALKPHOS 96 05/27/2018 1347   AST 17 05/27/2018 1347   ALT 29 05/27/2018 1347   BILITOT 0.2 (L) 05/27/2018 1347       RADIOGRAPHIC STUDIES: I have personally reviewed the radiological images as listed and agreed with the findings in the report. Ir Imaging Guided Port Insertion  Result Date: 05/08/2018 INDICATION: 53 year old with cervical cancer. Port-A-Cath needed for chemotherapy. EXAM: FLUOROSCOPIC AND ULTRASOUND GUIDED PLACEMENT OF A SUBCUTANEOUS PORT COMPARISON:  None. MEDICATIONS: Ancef 2 g; The antibiotic was administered within an appropriate time interval prior to skin puncture. ANESTHESIA/SEDATION: Versed 3.0 mg IV; Fentanyl 100 mcg IV; Moderate Sedation Time:  40 minutes The patient  was continuously monitored during the procedure by the interventional radiology nurse under my direct supervision. FLUOROSCOPY TIME:  30 seconds, 4 mGy COMPLICATIONS: None immediate. PROCEDURE: The procedure, risks, benefits, and alternatives were explained to the patient. Questions regarding the procedure were encouraged and answered. The patient understands and consents to the procedure. Patient was placed supine on the interventional table. Ultrasound confirmed a patent right internal jugular vein. Ultrasound image was saved for documentation. The right chest and neck were cleaned with a skin antiseptic and a sterile drape was placed. Maximal barrier sterile technique was utilized including caps, mask, sterile gowns, sterile gloves, sterile drape, hand hygiene and skin antiseptic. The right neck was anesthetized with 1% lidocaine. Small incision was made in the right neck with a blade. Micropuncture set was placed in the right internal jugular vein with ultrasound guidance. The micropuncture wire was used for measurement purposes. The right chest was anesthetized with 1% lidocaine with epinephrine. #15 blade was used to make an incision and a subcutaneous port pocket was formed. [place] was assembled. Subcutaneous tunnel was formed with a stiff tunneling device. The port catheter was brought through the subcutaneous tunnel. The port was placed in the subcutaneous pocket and sutured in place. The micropuncture set was exchanged for a peel-away sheath. The catheter was placed through the peel-away sheath and the tip was positioned at the SVC/right atrium junction. Catheter placement was confirmed with fluoroscopy. The port was accessed and flushed with heparinized saline. The port pocket was closed using two layers of absorbable sutures and Dermabond. The vein skin site was closed using a single layer of absorbable suture and Dermabond. Sterile dressings were applied. Patient tolerated the procedure  well without an immediate complication. Ultrasound and fluoroscopic images were taken and saved for this procedure. IMPRESSION: Placement of a subcutaneous port device. Catheter tip at the superior cavoatrial junction. Electronically Signed   By: Markus Daft M.D.   On: 05/08/2018 13:05    All questions were answered. The patient knows to call the clinic with any problems, questions or concerns. No barriers to learning was detected.  I spent 15 minutes counseling the patient face to face. The total time spent in the appointment was 20 minutes and more than 50% was on counseling and review of test results  Heath Lark, MD 05/28/2018 12:54 PM

## 2018-05-28 NOTE — Assessment & Plan Note (Signed)
We discussed daily antiemetics before radiation treatment

## 2018-05-28 NOTE — Assessment & Plan Note (Signed)
She has frequent diarrhea likely secondary to side effects of treatment We discussed hydration and Imodium as needed She has stopped taking laxatives

## 2018-05-28 NOTE — Assessment & Plan Note (Signed)
She has minimum pain.  She will continue over-the-counter analgesics and morphine as needed

## 2018-05-28 NOTE — Assessment & Plan Note (Signed)
So far, she tolerated treatment well except for fatigue, diarrhea and nausea We discussed supportive care She will proceed with treatment as scheduled tomorrow

## 2018-05-29 ENCOUNTER — Inpatient Hospital Stay: Payer: Self-pay

## 2018-05-29 ENCOUNTER — Other Ambulatory Visit: Payer: Self-pay

## 2018-05-29 ENCOUNTER — Ambulatory Visit
Admission: RE | Admit: 2018-05-29 | Discharge: 2018-05-29 | Disposition: A | Discharge status: Home / Self Care | Payer: Self-pay | Source: Ambulatory Visit | Attending: Radiation Oncology | Admitting: Radiation Oncology

## 2018-05-29 VITALS — BP 114/77 | HR 70 | Temp 98.7°F | Resp 20

## 2018-05-29 DIAGNOSIS — C530 Malignant neoplasm of endocervix: Secondary | ICD-10-CM

## 2018-05-29 MED ORDER — PALONOSETRON HCL INJECTION 0.25 MG/5ML
0.2500 mg | Freq: Once | INTRAVENOUS | Status: AC
  Administered 2018-05-29: 0.25 mg via INTRAVENOUS

## 2018-05-29 MED ORDER — SODIUM CHLORIDE 0.9 % IV SOLN
40.0000 mg/m2 | Freq: Once | INTRAVENOUS | Status: AC
  Administered 2018-05-29: 68 mg via INTRAVENOUS
  Filled 2018-05-29: qty 68

## 2018-05-29 MED ORDER — PALONOSETRON HCL INJECTION 0.25 MG/5ML
INTRAVENOUS | Status: AC
  Filled 2018-05-29: qty 5

## 2018-05-29 MED ORDER — SODIUM CHLORIDE 0.9% FLUSH
10.0000 mL | INTRAVENOUS | Status: DC | PRN
  Administered 2018-05-29: 10 mL
  Filled 2018-05-29: qty 10

## 2018-05-29 MED ORDER — HEPARIN SOD (PORK) LOCK FLUSH 100 UNIT/ML IV SOLN
500.0000 [IU] | Freq: Once | INTRAVENOUS | Status: AC | PRN
  Administered 2018-05-29: 500 [IU]
  Filled 2018-05-29: qty 5

## 2018-05-29 MED ORDER — POTASSIUM CHLORIDE 2 MEQ/ML IV SOLN
Freq: Once | INTRAVENOUS | Status: AC
  Administered 2018-05-29: 09:00:00 via INTRAVENOUS
  Filled 2018-05-29: qty 10

## 2018-05-29 MED ORDER — SODIUM CHLORIDE 0.9 % IV SOLN
Freq: Once | INTRAVENOUS | Status: AC
  Administered 2018-05-29: 08:00:00 via INTRAVENOUS
  Filled 2018-05-29: qty 250

## 2018-05-29 MED ORDER — SODIUM CHLORIDE 0.9 % IV SOLN
Freq: Once | INTRAVENOUS | Status: AC
  Administered 2018-05-29: 11:00:00 via INTRAVENOUS
  Filled 2018-05-29: qty 5

## 2018-05-29 NOTE — Patient Instructions (Signed)
Cancer Center Discharge Instructions for Patients Receiving Chemotherapy  Today you received the following chemotherapy agents Cisplatin  To help prevent nausea and vomiting after your treatment, we encourage you to take your nausea medication as directed  If you develop nausea and vomiting that is not controlled by your nausea medication, call the clinic.   BELOW ARE SYMPTOMS THAT SHOULD BE REPORTED IMMEDIATELY:  *FEVER GREATER THAN 100.5 F  *CHILLS WITH OR WITHOUT FEVER  NAUSEA AND VOMITING THAT IS NOT CONTROLLED WITH YOUR NAUSEA MEDICATION  *UNUSUAL SHORTNESS OF BREATH  *UNUSUAL BRUISING OR BLEEDING  TENDERNESS IN MOUTH AND THROAT WITH OR WITHOUT PRESENCE OF ULCERS  *URINARY PROBLEMS  *BOWEL PROBLEMS  UNUSUAL RASH Items with * indicate a potential emergency and should be followed up as soon as possible.  Feel free to call the clinic should you have any questions or concerns. The clinic phone number is [phone].  Please show the CHEMO ALERT CARD at check-in to the Emergency Department and triage nurse.   

## 2018-05-31 ENCOUNTER — Ambulatory Visit: Payer: Self-pay

## 2018-06-01 ENCOUNTER — Other Ambulatory Visit: Payer: Self-pay

## 2018-06-01 ENCOUNTER — Ambulatory Visit
Admission: RE | Admit: 2018-06-01 | Discharge: 2018-06-01 | Disposition: A | Discharge status: Home / Self Care | Payer: Self-pay | Source: Ambulatory Visit | Attending: Radiation Oncology | Admitting: Radiation Oncology

## 2018-06-02 ENCOUNTER — Ambulatory Visit
Admission: RE | Admit: 2018-06-02 | Discharge: 2018-06-02 | Disposition: A | Discharge status: Home / Self Care | Payer: Self-pay | Source: Ambulatory Visit | Attending: Radiation Oncology | Admitting: Radiation Oncology

## 2018-06-02 ENCOUNTER — Other Ambulatory Visit: Payer: Self-pay

## 2018-06-03 ENCOUNTER — Inpatient Hospital Stay: Payer: Self-pay

## 2018-06-03 ENCOUNTER — Other Ambulatory Visit (HOSPITAL_COMMUNITY): Payer: Self-pay | Admitting: Radiation Oncology

## 2018-06-03 ENCOUNTER — Ambulatory Visit
Admission: RE | Admit: 2018-06-03 | Discharge: 2018-06-03 | Disposition: A | Discharge status: Home / Self Care | Payer: Self-pay | Source: Ambulatory Visit | Attending: Radiation Oncology | Admitting: Radiation Oncology

## 2018-06-03 ENCOUNTER — Other Ambulatory Visit: Payer: Self-pay | Admitting: Radiation Oncology

## 2018-06-03 ENCOUNTER — Other Ambulatory Visit: Payer: Self-pay

## 2018-06-03 DIAGNOSIS — C530 Malignant neoplasm of endocervix: Secondary | ICD-10-CM

## 2018-06-03 LAB — COMPREHENSIVE METABOLIC PANEL
ALT: 19 U/L (ref 0–44)
AST: 14 U/L — ABNORMAL LOW (ref 15–41)
Albumin: 3.7 g/dL (ref 3.5–5.0)
Alkaline Phosphatase: 82 U/L (ref 38–126)
Anion gap: 10 (ref 5–15)
BUN: 12 mg/dL (ref 6–20)
CO2: 25 mmol/L (ref 22–32)
Calcium: 9 mg/dL (ref 8.9–10.3)
Chloride: 103 mmol/L (ref 98–111)
Creatinine, Ser: 0.77 mg/dL (ref 0.44–1.00)
GFR calc Af Amer: >60 mL/min (ref >60)
GFR calc non Af Amer: >60 mL/min (ref >60)
Glucose, Bld: 76 mg/dL (ref 70–99)
Potassium: 3.4 mmol/L — ABNORMAL LOW (ref 3.5–5.1)
Sodium: 138 mmol/L (ref 135–145)
Total Bilirubin: 0.2 mg/dL — ABNORMAL LOW (ref 0.3–1.2)
Total Protein: 6.7 g/dL (ref 6.5–8.1)

## 2018-06-03 LAB — CBC WITH DIFFERENTIAL/PLATELET
Abs Immature Granulocytes: 0 10*3/uL (ref 0.00–0.07)
Basophils Absolute: 0 10*3/uL (ref 0.0–0.1)
Basophils Relative: 0 %
Eosinophils Absolute: 0.4 10*3/uL (ref 0.0–0.5)
Eosinophils Relative: 13 %
HCT: 32.2 % — ABNORMAL LOW (ref 36.0–46.0)
Hemoglobin: 10.4 g/dL — ABNORMAL LOW (ref 12.0–15.0)
Immature Granulocytes: 0 %
Lymphocytes Relative: 12 %
Lymphs Abs: 0.3 10*3/uL — ABNORMAL LOW (ref 0.7–4.0)
MCH: 30.7 pg (ref 26.0–34.0)
MCHC: 32.3 g/dL (ref 30.0–36.0)
MCV: 95 fL (ref 80.0–100.0)
Monocytes Absolute: 0.3 10*3/uL (ref 0.1–1.0)
Monocytes Relative: 10 %
Neutro Abs: 1.9 10*3/uL (ref 1.7–7.7)
Neutrophils Relative %: 65 %
Platelets: 179 10*3/uL (ref 150–400)
RBC: 3.39 MIL/uL — ABNORMAL LOW (ref 3.87–5.11)
RDW: 11.9 % (ref 11.5–15.5)
WBC: 2.9 10*3/uL — ABNORMAL LOW (ref 4.0–10.5)
nRBC: 0 % (ref 0.0–0.2)

## 2018-06-03 LAB — MAGNESIUM: Magnesium: 1.5 mg/dL — ABNORMAL LOW (ref 1.7–2.4)

## 2018-06-04 ENCOUNTER — Inpatient Hospital Stay: Payer: Self-pay

## 2018-06-04 ENCOUNTER — Other Ambulatory Visit: Payer: Self-pay | Admitting: Hematology and Oncology

## 2018-06-04 ENCOUNTER — Other Ambulatory Visit: Payer: Self-pay

## 2018-06-04 ENCOUNTER — Ambulatory Visit
Admission: RE | Admit: 2018-06-04 | Discharge: 2018-06-04 | Disposition: A | Discharge status: Home / Self Care | Payer: Self-pay | Source: Ambulatory Visit | Attending: Radiation Oncology | Admitting: Radiation Oncology

## 2018-06-04 ENCOUNTER — Inpatient Hospital Stay (HOSPITAL_BASED_OUTPATIENT_CLINIC_OR_DEPARTMENT_OTHER): Payer: Self-pay | Admitting: Hematology and Oncology

## 2018-06-04 DIAGNOSIS — C530 Malignant neoplasm of endocervix: Secondary | ICD-10-CM

## 2018-06-04 DIAGNOSIS — E876 Hypokalemia: Secondary | ICD-10-CM

## 2018-06-04 DIAGNOSIS — R300 Dysuria: Secondary | ICD-10-CM

## 2018-06-04 DIAGNOSIS — R197 Diarrhea, unspecified: Secondary | ICD-10-CM

## 2018-06-04 DIAGNOSIS — D61818 Other pancytopenia: Secondary | ICD-10-CM

## 2018-06-04 LAB — URINALYSIS, COMPLETE (UACMP) WITH MICROSCOPIC
Bacteria, UA: NONE SEEN
Bilirubin Urine: NEGATIVE
Glucose, UA: NEGATIVE mg/dL
Ketones, ur: NEGATIVE mg/dL
Leukocytes,Ua: NEGATIVE
Nitrite: NEGATIVE
Protein, ur: NEGATIVE mg/dL
Specific Gravity, Urine: 1.009 (ref 1.005–1.030)
pH: 6 (ref 5.0–8.0)

## 2018-06-04 MED ORDER — MAGNESIUM OXIDE 400 (241.3 MG) MG PO TABS: 400.0000 mg | ORAL_TABLET | Freq: Every day | ORAL | 1 refills | Status: DC

## 2018-06-05 ENCOUNTER — Other Ambulatory Visit: Payer: Self-pay

## 2018-06-05 ENCOUNTER — Encounter: Payer: Self-pay | Admitting: Hematology and Oncology

## 2018-06-05 ENCOUNTER — Inpatient Hospital Stay: Payer: Self-pay

## 2018-06-05 ENCOUNTER — Ambulatory Visit
Admission: RE | Admit: 2018-06-05 | Discharge: 2018-06-05 | Disposition: A | Discharge status: Home / Self Care | Payer: Self-pay | Source: Ambulatory Visit | Attending: Radiation Oncology | Admitting: Radiation Oncology

## 2018-06-05 VITALS — BP 111/77 | HR 96 | Temp 98.3°F | Resp 18

## 2018-06-05 DIAGNOSIS — C530 Malignant neoplasm of endocervix: Secondary | ICD-10-CM

## 2018-06-05 DIAGNOSIS — D61818 Other pancytopenia: Secondary | ICD-10-CM | POA: Insufficient documentation

## 2018-06-05 LAB — URINE CULTURE: Culture: <10000 — AB

## 2018-06-05 MED ORDER — SODIUM CHLORIDE 0.9 % IV SOLN
40.0000 mg/m2 | Freq: Once | INTRAVENOUS | Status: AC
  Administered 2018-06-05: 68 mg via INTRAVENOUS
  Filled 2018-06-05: qty 68

## 2018-06-05 MED ORDER — POTASSIUM CHLORIDE 2 MEQ/ML IV SOLN
Freq: Once | INTRAVENOUS | Status: AC
  Administered 2018-06-05: 10:00:00 via INTRAVENOUS
  Filled 2018-06-05: qty 10

## 2018-06-05 MED ORDER — SODIUM CHLORIDE 0.9 % IV SOLN
Freq: Once | INTRAVENOUS | Status: AC
  Administered 2018-06-05: 10:00:00 via INTRAVENOUS
  Filled 2018-06-05: qty 250

## 2018-06-05 MED ORDER — SODIUM CHLORIDE 0.9 % IV SOLN
Freq: Once | INTRAVENOUS | Status: AC
  Administered 2018-06-05: 12:00:00 via INTRAVENOUS
  Filled 2018-06-05: qty 5

## 2018-06-05 MED ORDER — HEPARIN SOD (PORK) LOCK FLUSH 100 UNIT/ML IV SOLN
500.0000 [IU] | Freq: Once | INTRAVENOUS | Status: AC | PRN
  Administered 2018-06-05: 16:00:00 500 [IU]
  Filled 2018-06-05: qty 5

## 2018-06-05 MED ORDER — PALONOSETRON HCL INJECTION 0.25 MG/5ML
INTRAVENOUS | Status: AC
  Filled 2018-06-05: qty 5

## 2018-06-05 MED ORDER — PALONOSETRON HCL INJECTION 0.25 MG/5ML
0.2500 mg | Freq: Once | INTRAVENOUS | Status: AC
  Administered 2018-06-05: 0.25 mg via INTRAVENOUS

## 2018-06-05 MED ORDER — SODIUM CHLORIDE 0.9% FLUSH
10.0000 mL | INTRAVENOUS | Status: DC | PRN
  Administered 2018-06-05: 10 mL
  Filled 2018-06-05: qty 10

## 2018-06-05 NOTE — Assessment & Plan Note (Signed)
So far, she tolerated treatment well except for fatigue, diarrhea, progressive pancytopenia and nausea We discussed supportive care She will proceed with treatment as scheduled

## 2018-06-05 NOTE — Progress Notes (Signed)
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[facility]) So far, she tolerated treatment well except for fatigue, diarrhea, progressive pancytopenia and nausea We discussed supportive care She will proceed with treatment as scheduled   Pancytopenia, acquired ([place]) She has progressive pancytopenia due to treatment She is not symptomatic We will proceed with treatment as scheduled  Diarrhea She has significant diarrhea secondary to side effects of treatment She will continue Imodium as needed and increase hydration as tolerated  Hypomagnesemia She has new onset of hypomagnesemia and that caused mild hypokalemia We discussed magnesium supplement and potassium rich diet   No orders of the defined types were placed in this encounter.   INTERVAL HISTORY: Please see below for problem oriented charting. She returns for chemotherapy follow-up She continues to have significant diarrhea Her energy level is fair Denies recent infection, fever or chills No nausea or vomiting  SUMMARY OF ONCOLOGIC HISTORY:   Malignant neoplasm of endocervix ([place])   09/15/2017 Initial Diagnosis    The patient reports postcoital bleeding since August, 2019 and postmenopausal bleeding since November, 2019. 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     04/16/2018 Surgery    She was taken to the OR on 04/16/18 for a cervical cone biopsy. The specimen revealed a grossly normal cervix on the ectocervix.  Cone specimen revealed squamous cell carcinoma.  When discussing this with the pathologist he reported that in the actual cone specimen itself that was CIN 3 however there was a separate fragment of tissue that included carcinoma.  The dimensions of this was 0.6 cm, however this involved the margins.  A  post cone ECC was positive for squamous cell carcinoma, as well as an endometrial curettage which also contain benign endometrial glands.     04/16/2018 Pathology Results    Endocervix curettage: [facility]    04/22/2018 Initial Diagnosis    Malignant neoplasm of endocervix ([place])    04/24/2018 PET scan    1. There is a large mass involving the cervix and endometrium which has a maximum dimension of 5.3 cm within SUV max of 20.4. 2. Small bilateral pelvic sidewall lymph nodes measuring less than 1cm with mild nonspecific uptake. The no hypermetabolic adenopathy or evidence of distant metastatic disease. 3. Nonspecific pulmonary nodules measuring less than 5 mm are identified in the right lung. Too small to characterize by PET-CT.     04/27/2018 Imaging    MRI pelvis  1. Uterine cervix 5.5 x 3.4 x 3.2 cm mass compatible with primary cervical malignancy, with mild parametrial invasion. Stage IIB by MRI. 2. Small volume simple free fluid in the pelvic cul-de-sac. 3. No pathologically enlarged pelvic lymph nodes, see comments.    05/08/2018 Procedure    Placement of a subcutaneous port device. Catheter tip at the superior cavoatrial junction.    05/15/2018 -  Chemotherapy    The patient had weekly cisplatin     REVIEW OF SYSTEMS:   Constitutional: Denies fevers, chills or abnormal weight loss Eyes: Denies blurriness of vision Ears, nose, mouth, throat, and face: Denies mucositis or sore throat Respiratory: Denies cough, dyspnea or wheezes Cardiovascular: Denies palpitation, chest discomfort or lower extremity swelling Skin: Denies abnormal skin rashes Lymphatics: Denies new lymphadenopathy or easy bruising Neurological:Denies numbness, tingling or new weaknesses  Behavioral/Psych: Mood is stable, no new changes  All other systems were reviewed with the patient and are negative.  I have reviewed the past medical history, past surgical history, social history and family history with the patient  and they are unchanged from previous note.  ALLERGIES:  has No Known Allergies.  MEDICATIONS:  Current Outpatient Medications  Medication Sig Dispense Refill  . acetaminophen (TYLENOL) 500 MG tablet Take 1,000 mg by mouth every 8 (eight) hours as needed.    . lidocaine-prilocaine (EMLA) cream Apply to affected area once 30 g 3  . magnesium oxide (MAG-OX) 400 (241.3 Mg) MG tablet Take 1 tablet (400 mg total) by mouth daily. 30 tablet 1  . morphine (MSIR) 15 MG tablet Take 1 tablet (15 mg total) by mouth every 6 (six) hours as needed for severe pain. 30 tablet 0  . ondansetron (ZOFRAN) 8 MG tablet Take 1 tablet (8 mg total) by mouth every 8 (eight) hours as needed. Start on the third day after chemotherapy. 30 tablet 1  . polyethylene glycol (MIRALAX / GLYCOLAX) packet Take 17 g by mouth daily.    . prochlorperazine (COMPAZINE) 10 MG tablet Take 1 tablet (10 mg total) by mouth every 6 (six) hours as needed (Nausea or vomiting). 30 tablet 1  . SUMAtriptan (IMITREX) 100 MG tablet Take 1 tablet (100 mg total) by mouth once for 1 dose. May repeat in 2 hours if no relief. 10 tablet 11   No current facility-administered medications for this visit.    Facility-Administered Medications Ordered in Other Visits  Medication Dose Route Frequency Provider Last Rate Last Dose  . fosaprepitant (EMEND) 150 mg, dexamethasone (DECADRON) 12 mg in sodium chloride 0.9 % 145 mL IVPB   Intravenous Once Alvy Bimler, Adaleen Hulgan, MD      . heparin lock flush 100 unit/mL  500 Units Intracatheter Once PRN Alvy Bimler, Rodgerick Gilliand, MD      . palonosetron (ALOXI) injection 0.25 mg  0.25 mg Intravenous Once Harli Engelken, MD      . sodium chloride flush (NS) 0.9 % injection 10 mL  10 mL Intracatheter PRN Alvy Bimler, Marsela Kuan, MD        PHYSICAL EXAMINATION: ECOG PERFORMANCE STATUS: 1 - Symptomatic but completely ambulatory  Vitals:   06/04/18 1140  BP: 107/74  Pulse: 82  Resp: 18  Temp: 98.1 F (36.7 C)  SpO2: 100%   Filed Weights   06/04/18  1140  Weight: 146 lb 3.2 oz (66.3 kg)    GENERAL:alert, no distress and comfortable SKIN: skin color, texture, turgor are normal, no rashes or significant lesions EYES: normal, Conjunctiva are pink and non-injected, sclera clear OROPHARYNX:no exudate, no erythema and lips, buccal mucosa, and tongue normal  NECK: supple, thyroid normal size, non-tender, without nodularity LYMPH:  no palpable lymphadenopathy in the cervical, axillary or inguinal LUNGS: clear to auscultation and percussion with normal breathing effort HEART: regular rate & rhythm and no murmurs and no lower extremity edema ABDOMEN:abdomen soft, non-tender and normal bowel sounds Musculoskeletal:no cyanosis of digits and no clubbing  NEURO: alert & oriented x 3 with fluent speech, no focal motor/sensory deficits  LABORATORY DATA:  I have reviewed the data as listed    Component Value Date/Time   NA 138 06/03/2018 1127   K 3.4 (L) 06/03/2018 1127   CL 103 06/03/2018 1127   CO2 25 06/03/2018 1127   GLUCOSE 76 06/03/2018 1127   BUN 12 06/03/2018 1127   CREATININE 0.77 06/03/2018 1127   CALCIUM 9.0 06/03/2018  1127   PROT 6.7 06/03/2018 1127   ALBUMIN 3.7 06/03/2018 1127   AST 14 (L) 06/03/2018 1127   ALT 19 06/03/2018 1127   ALKPHOS 82 06/03/2018 1127   BILITOT 0.2 (L) 06/03/2018 1127   GFRNONAA >60 06/03/2018 1127   GFRAA >60 06/03/2018 1127    No results found for: SPEP, UPEP  Lab Results  Component Value Date   WBC 2.9 (L) 06/03/2018   NEUTROABS 1.9 06/03/2018   HGB 10.4 (L) 06/03/2018   HCT 32.2 (L) 06/03/2018   MCV 95.0 06/03/2018   PLT 179 06/03/2018      Chemistry      Component Value Date/Time   NA 138 06/03/2018 1127   K 3.4 (L) 06/03/2018 1127   CL 103 06/03/2018 1127   CO2 25 06/03/2018 1127   BUN 12 06/03/2018 1127   CREATININE 0.77 06/03/2018 1127      Component Value Date/Time   CALCIUM 9.0 06/03/2018 1127   ALKPHOS 82 06/03/2018 1127   AST 14 (L) 06/03/2018 1127   ALT 19  06/03/2018 1127   BILITOT 0.2 (L) 06/03/2018 1127       RADIOGRAPHIC STUDIES: I have personally reviewed the radiological images as listed and agreed with the findings in the report. Ir Imaging Guided Port Insertion  Result Date: 05/08/2018 INDICATION: 53 year old with cervical cancer. Port-A-Cath needed for chemotherapy. EXAM: FLUOROSCOPIC AND ULTRASOUND GUIDED PLACEMENT OF A SUBCUTANEOUS PORT COMPARISON:  None. MEDICATIONS: Ancef 2 g; The antibiotic was administered within an appropriate time interval prior to skin puncture. ANESTHESIA/SEDATION: Versed 3.0 mg IV; Fentanyl 100 mcg IV; Moderate Sedation Time:  40 minutes The patient was continuously monitored during the procedure by the interventional radiology nurse under my direct supervision. FLUOROSCOPY TIME:  30 seconds, 4 mGy COMPLICATIONS: None immediate. PROCEDURE: The procedure, risks, benefits, and alternatives were explained to the patient. Questions regarding the procedure were encouraged and answered. The patient understands and consents to the procedure. Patient was placed supine on the interventional table. Ultrasound confirmed a patent right internal jugular vein. Ultrasound image was saved for documentation. The right chest and neck were cleaned with a skin antiseptic and a sterile drape was placed. Maximal barrier sterile technique was utilized including caps, mask, sterile gowns, sterile gloves, sterile drape, hand hygiene and skin antiseptic. The right neck was anesthetized with 1% lidocaine. Small incision was made in the right neck with a blade. Micropuncture set was placed in the right internal jugular vein with ultrasound guidance. The micropuncture wire was used for measurement purposes. The right chest was anesthetized with 1% lidocaine with epinephrine. #15 blade was used to make an incision and a subcutaneous port pocket was formed. [place] was assembled. Subcutaneous tunnel was formed with a stiff tunneling device.  The port catheter was brought through the subcutaneous tunnel. The port was placed in the subcutaneous pocket and sutured in place. The micropuncture set was exchanged for a peel-away sheath. The catheter was placed through the peel-away sheath and the tip was positioned at the SVC/right atrium junction. Catheter placement was confirmed with fluoroscopy. The port was accessed and flushed with heparinized saline. The port pocket was closed using two layers of absorbable sutures and Dermabond. The vein skin site was closed using a single layer of absorbable suture and Dermabond. Sterile dressings were applied. Patient tolerated the procedure well without an immediate complication. Ultrasound and fluoroscopic images were taken and saved for this procedure. IMPRESSION: Placement of a subcutaneous port device. Catheter tip at  the superior cavoatrial junction. Electronically Signed   By: Markus Daft M.D.   On: 05/08/2018 13:05    All questions were answered. The patient knows to call the clinic with any problems, questions or concerns. No barriers to learning was detected.  I spent 15 minutes counseling the patient face to face. The total time spent in the appointment was 20 minutes and more than 50% was on counseling and review of test results  Heath Lark, MD 06/05/2018 10:41 AM

## 2018-06-05 NOTE — Assessment & Plan Note (Signed)
She has new onset of hypomagnesemia and that caused mild hypokalemia We discussed magnesium supplement and potassium rich diet

## 2018-06-05 NOTE — Assessment & Plan Note (Signed)
She has significant diarrhea secondary to side effects of treatment She will continue Imodium as needed and increase hydration as tolerated

## 2018-06-05 NOTE — Assessment & Plan Note (Signed)
She has progressive pancytopenia due to treatment She is not symptomatic We will proceed with treatment as scheduled

## 2018-06-05 NOTE — Patient Instructions (Signed)
[facility] Discharge Instructions for Patients Receiving Chemotherapy  Today you received the following chemotherapy agents: Cisplatin  To help prevent nausea and vomiting after your treatment, we encourage you to take your nausea medication  as prescribed.    If you develop nausea and vomiting that is not controlled by your nausea medication, call the clinic.   BELOW ARE SYMPTOMS THAT SHOULD BE REPORTED IMMEDIATELY:  *FEVER GREATER THAN 100.5 F  *CHILLS WITH OR WITHOUT FEVER  NAUSEA AND VOMITING THAT IS NOT CONTROLLED WITH YOUR NAUSEA MEDICATION  *UNUSUAL SHORTNESS OF BREATH  *UNUSUAL BRUISING OR BLEEDING  TENDERNESS IN MOUTH AND THROAT WITH OR WITHOUT PRESENCE OF ULCERS  *URINARY PROBLEMS  *BOWEL PROBLEMS  UNUSUAL RASH Items with * indicate a potential emergency and should be followed up as soon as possible.  Feel free to call the clinic should you have any questions or concerns. The clinic phone number is [phone].  Please show the CHEMO ALERT CARD at check-in to the Emergency Department and triage nurse.   

## 2018-06-08 ENCOUNTER — Other Ambulatory Visit: Payer: Self-pay

## 2018-06-08 ENCOUNTER — Telehealth: Payer: Self-pay | Admitting: *Deleted

## 2018-06-08 ENCOUNTER — Telehealth: Payer: Self-pay

## 2018-06-08 ENCOUNTER — Telehealth: Payer: Self-pay | Admitting: Oncology

## 2018-06-08 ENCOUNTER — Ambulatory Visit
Admission: RE | Admit: 2018-06-08 | Discharge: 2018-06-08 | Disposition: A | Discharge status: Home / Self Care | Payer: Self-pay | Source: Ambulatory Visit | Attending: Radiation Oncology | Admitting: Radiation Oncology

## 2018-06-08 NOTE — Telephone Encounter (Signed)
Patient called the office requesting her lab results and to state that she is still having UTI symptoms. Explained that I will forward the message to the Dr. Elson Areas desk RN.

## 2018-06-08 NOTE — Telephone Encounter (Addendum)
Sheena Torres left a message asking if she will have labs scheduled for Dr. Alvy Bimler on 06/24/18.  She needs to have labs for radiation and wants to make sure she does not have double lab appointments.  Called her back and let her know that her last lab appointment scheduled is for 06/17/18.  She verbalized agreement and said that she had talked with someone from Radiation and had sorted it all out.

## 2018-06-08 NOTE — Telephone Encounter (Signed)
-----   Message from Heath Lark, MD sent at 06/08/2018  8:29 AM EDT ----- Regarding: urine culture is neg  ----- Message ----- From: Buel Ream, Lab In [place] Sent: 06/04/2018  12:33 PM EDT To: Heath Lark, MD

## 2018-06-08 NOTE — Telephone Encounter (Signed)
Called and given below message. She verbalized understanding. °Instructed to call the office if needed. °

## 2018-06-09 ENCOUNTER — Other Ambulatory Visit: Payer: Self-pay | Admitting: Radiation Oncology

## 2018-06-09 ENCOUNTER — Other Ambulatory Visit: Payer: Self-pay

## 2018-06-09 ENCOUNTER — Ambulatory Visit
Admission: RE | Admit: 2018-06-09 | Discharge: 2018-06-09 | Disposition: A | Discharge status: Home / Self Care | Payer: Self-pay | Source: Ambulatory Visit | Attending: Radiation Oncology | Admitting: Radiation Oncology

## 2018-06-09 DIAGNOSIS — C530 Malignant neoplasm of endocervix: Secondary | ICD-10-CM

## 2018-06-09 MED ORDER — PROCHLORPERAZINE MALEATE 10 MG PO TABS: 10.0000 mg | ORAL_TABLET | Freq: Four times a day (QID) | ORAL | 1 refills | Status: DC | PRN

## 2018-06-09 MED ORDER — ONDANSETRON HCL 8 MG PO TABS: 8.0000 mg | ORAL_TABLET | Freq: Three times a day (TID) | ORAL | 1 refills | Status: DC | PRN

## 2018-06-09 MED FILL — PROCHLORPERAZINE 10 MG TAB: 10 | 8 days supply | Qty: 30 | Fill #0

## 2018-06-09 MED FILL — ONDANSETRON HCL 8 MG TABLET: 8 | 10 days supply | Qty: 30 | Fill #0

## 2018-06-10 ENCOUNTER — Inpatient Hospital Stay: Payer: Self-pay

## 2018-06-10 ENCOUNTER — Other Ambulatory Visit: Payer: Self-pay | Admitting: Hematology and Oncology

## 2018-06-10 ENCOUNTER — Ambulatory Visit
Admission: RE | Admit: 2018-06-10 | Discharge: 2018-06-10 | Disposition: A | Discharge status: Home / Self Care | Payer: Self-pay | Source: Ambulatory Visit | Attending: Radiation Oncology | Admitting: Radiation Oncology

## 2018-06-10 ENCOUNTER — Other Ambulatory Visit: Payer: Self-pay

## 2018-06-10 DIAGNOSIS — C530 Malignant neoplasm of endocervix: Secondary | ICD-10-CM

## 2018-06-10 LAB — COMPREHENSIVE METABOLIC PANEL
ALT: 19 U/L (ref 0–44)
AST: 15 U/L (ref 15–41)
Albumin: 3.5 g/dL (ref 3.5–5.0)
Alkaline Phosphatase: 76 U/L (ref 38–126)
Anion gap: 9 (ref 5–15)
BUN: 11 mg/dL (ref 6–20)
CO2: 26 mmol/L (ref 22–32)
Calcium: 8.8 mg/dL — ABNORMAL LOW (ref 8.9–10.3)
Chloride: 102 mmol/L (ref 98–111)
Creatinine, Ser: 0.79 mg/dL (ref 0.44–1.00)
GFR calc Af Amer: >60 mL/min (ref >60)
GFR calc non Af Amer: >60 mL/min (ref >60)
Glucose, Bld: 106 mg/dL — ABNORMAL HIGH (ref 70–99)
Potassium: 3.6 mmol/L (ref 3.5–5.1)
Sodium: 137 mmol/L (ref 135–145)
Total Bilirubin: 0.3 mg/dL (ref 0.3–1.2)
Total Protein: 6.5 g/dL (ref 6.5–8.1)

## 2018-06-10 LAB — CBC WITH DIFFERENTIAL/PLATELET
Abs Immature Granulocytes: 0.01 10*3/uL (ref 0.00–0.07)
Basophils Absolute: 0 10*3/uL (ref 0.0–0.1)
Basophils Relative: 1 %
Eosinophils Absolute: 0.2 10*3/uL (ref 0.0–0.5)
Eosinophils Relative: 11 %
HCT: 29.3 % — ABNORMAL LOW (ref 36.0–46.0)
Hemoglobin: 9.8 g/dL — ABNORMAL LOW (ref 12.0–15.0)
Immature Granulocytes: 1 %
Lymphocytes Relative: 16 %
Lymphs Abs: 0.3 10*3/uL — ABNORMAL LOW (ref 0.7–4.0)
MCH: 30.4 pg (ref 26.0–34.0)
MCHC: 33.4 g/dL (ref 30.0–36.0)
MCV: 91 fL (ref 80.0–100.0)
Monocytes Absolute: 0.3 10*3/uL (ref 0.1–1.0)
Monocytes Relative: 15 %
Neutro Abs: 0.9 10*3/uL — ABNORMAL LOW (ref 1.7–7.7)
Neutrophils Relative %: 56 %
Platelets: 103 10*3/uL — ABNORMAL LOW (ref 150–400)
RBC: 3.22 MIL/uL — ABNORMAL LOW (ref 3.87–5.11)
RDW: 11.9 % (ref 11.5–15.5)
WBC: 1.6 10*3/uL — ABNORMAL LOW (ref 4.0–10.5)
nRBC: 0 % (ref 0.0–0.2)

## 2018-06-10 LAB — MAGNESIUM: Magnesium: 1.5 mg/dL — ABNORMAL LOW (ref 1.7–2.4)

## 2018-06-11 ENCOUNTER — Inpatient Hospital Stay (HOSPITAL_BASED_OUTPATIENT_CLINIC_OR_DEPARTMENT_OTHER): Payer: Self-pay | Admitting: Hematology and Oncology

## 2018-06-11 ENCOUNTER — Other Ambulatory Visit: Payer: Self-pay

## 2018-06-11 ENCOUNTER — Telehealth: Payer: Self-pay | Admitting: Hematology and Oncology

## 2018-06-11 ENCOUNTER — Ambulatory Visit
Admission: RE | Admit: 2018-06-11 | Discharge: 2018-06-11 | Disposition: A | Discharge status: Home / Self Care | Payer: Self-pay | Source: Ambulatory Visit | Attending: Radiation Oncology | Admitting: Radiation Oncology

## 2018-06-11 ENCOUNTER — Encounter: Payer: Self-pay | Admitting: Hematology and Oncology

## 2018-06-11 VITALS — BP 116/72 | HR 87 | Temp 98.1°F | Resp 17 | Ht 62.0 in | Wt 147.0 lb

## 2018-06-11 DIAGNOSIS — D61818 Other pancytopenia: Secondary | ICD-10-CM

## 2018-06-11 DIAGNOSIS — T451X5A Adverse effect of antineoplastic and immunosuppressive drugs, initial encounter: Secondary | ICD-10-CM

## 2018-06-11 DIAGNOSIS — R197 Diarrhea, unspecified: Secondary | ICD-10-CM

## 2018-06-11 DIAGNOSIS — R110 Nausea: Secondary | ICD-10-CM

## 2018-06-11 DIAGNOSIS — C530 Malignant neoplasm of endocervix: Secondary | ICD-10-CM

## 2018-06-11 MED ORDER — MAGNESIUM OXIDE 400 (241.3 MG) MG PO TABS: 400.0000 mg | ORAL_TABLET | Freq: Two times a day (BID) | ORAL | 1 refills | Status: DC

## 2018-06-11 MED ORDER — TBO-FILGRASTIM 300 MCG/0.5ML ~~LOC~~ SOSY
PREFILLED_SYRINGE | SUBCUTANEOUS | Status: AC
  Filled 2018-06-11: qty 0.5

## 2018-06-11 MED ORDER — TBO-FILGRASTIM 300 MCG/0.5ML ~~LOC~~ SOSY
300.0000 ug | PREFILLED_SYRINGE | Freq: Once | SUBCUTANEOUS | Status: AC
  Administered 2018-06-11: 11:00:00 300 ug via SUBCUTANEOUS

## 2018-06-11 NOTE — Assessment & Plan Note (Signed)
She has significant diarrhea secondary to side effects of treatment She will continue Imodium as needed and increase hydration as tolerated

## 2018-06-11 NOTE — Assessment & Plan Note (Signed)
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

## 2018-06-11 NOTE — Assessment & Plan Note (Signed)
She has missed several doses of oral magnesium supplement I recommend consistent dosage and to increase magnesium oxide to twice a day

## 2018-06-11 NOTE — Assessment & Plan Note (Addendum)
She is experiencing multiple side effects including progressive pancytopenia, persistent diarrhea and fatigue Due to severe pancytopenia, I do not recommend treatment tomorrow We will defer to next week In the meantime, she will continue radiation therapy.

## 2018-06-11 NOTE — Assessment & Plan Note (Signed)
This is stable on schedule antiemetics.

## 2018-06-11 NOTE — Patient Instructions (Signed)
Tbo-Filgrastim injection What is this medicine? TBO-FILGRASTIM (T B O fil GRA stim) is a granulocyte colony-stimulating factor that stimulates the growth of neutrophils, a type of white blood cell important in the body's fight against infection. It is used to reduce the incidence of fever and infection in patients with certain types of cancer who are receiving chemotherapy that affects the bone marrow. This medicine may be used for other purposes; ask your health care provider or pharmacist if you have questions. COMMON BRAND NAME(S): Granix What should I tell my health care provider before I take this medicine? They need to know if you have any of these conditions: -bone scan or tests planned -kidney disease -sickle cell anemia -an unusual or allergic reaction to tbo-filgrastim, filgrastim, pegfilgrastim, other medicines, foods, dyes, or preservatives -pregnant or trying to get pregnant -breast-feeding How should I use this medicine? This medicine is for injection under the skin. If you get this medicine at home, you will be taught how to prepare and give this medicine. Refer to the Instructions for Use that come with your medication packaging. Use exactly as directed. Take your medicine at regular intervals. Do not take your medicine more often than directed. It is important that you put your used needles and syringes in a special sharps container. Do not put them in a trash can. If you do not have a sharps container, call your pharmacist or healthcare provider to get one. Talk to your pediatrician regarding the use of this medicine in children. While this drug may be prescribed for children as young as 1 month of age for selected conditions, precautions do apply. Overdosage: If you think you have taken too much of this medicine contact a poison control center or emergency room at once. NOTE: This medicine is only for you. Do not share this medicine with others. What if I miss a dose? It is  important not to miss your dose. Call your doctor or health care professional if you miss a dose. What may interact with this medicine? This medicine may interact with the following medications: -medicines that may cause a release of neutrophils, such as lithium This list may not describe all possible interactions. Give your health care provider a list of all the medicines, herbs, non-prescription drugs, or dietary supplements you use. Also tell them if you smoke, drink alcohol, or use illegal drugs. Some items may interact with your medicine. What should I watch for while using this medicine? You may need blood work done while you are taking this medicine. What side effects may I notice from receiving this medicine? Side effects that you should report to your doctor or health care professional as soon as possible: -allergic reactions like skin rash, itching or hives, swelling of the face, lips, or tongue -back pain -blood in the urine -dark urine -dizziness -fast heartbeat -feeling faint -shortness of breath or breathing problems -signs and symptoms of infection like fever or chills; cough; or sore throat -signs and symptoms of kidney injury like trouble passing urine or change in the amount of urine -stomach or side pain, or pain at the shoulder -sweating -swelling of the legs, ankles, or abdomen -tiredness Side effects that usually do not require medical attention (report to your doctor or health care professional if they continue or are bothersome): -bone pain -diarrhea -headache -muscle pain -vomiting This list may not describe all possible side effects. Call your doctor for medical advice about side effects. You may report side effects to FDA at   1-800-FDA-1088. Where should I keep my medicine? Keep out of the reach of children. Store in a refrigerator between 2 and 8 degrees C (36 and 46 degrees F). Keep in carton to protect from light. Throw away this medicine if it is left out  of the refrigerator for more than 5 consecutive days. Throw away any unused medicine after the expiration date. NOTE: This sheet is a summary. It may not cover all possible information. If you have questions about this medicine, talk to your doctor, pharmacist, or health care provider.  2019 Elsevier/Gold Standard (2016-09-17 16:56:18)  

## 2018-06-11 NOTE — Progress Notes (Signed)
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  Diarrhea She has significant diarrhea secondary to side effects of treatment She will continue Imodium as needed and increase hydration as tolerated   No orders of the defined types were placed in this encounter.   INTERVAL HISTORY: Please see below for problem oriented charting. She returns for further follow-up She complained of persistent fatigue No recent infection, fever or chills Her diarrhea is well controlled with Imodium as needed She has missed several doses of oral magnesium recently Her nausea is well controlled with scheduled antiemetics She denies peripheral neuropathy  SUMMARY OF ONCOLOGIC HISTORY:   Malignant neoplasm of endocervix ([place])   09/15/2017 Initial Diagnosis    The patient reports postcoital bleeding since August, 2019 and postmenopausal bleeding since November, 2019. 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     04/16/2018 Surgery    She was taken to the OR on 04/16/18 for a cervical cone biopsy. The specimen revealed a grossly normal cervix on the ectocervix.  Cone specimen revealed squamous cell carcinoma.  When discussing this with the pathologist he reported that in the actual cone specimen itself that was CIN 3 however there was a separate fragment of tissue that included carcinoma.  The dimensions of this was 0.6 cm, however this involved the margins.  A post cone ECC was positive for squamous cell carcinoma, as well as an endometrial curettage which also contain benign endometrial glands.     04/16/2018 Pathology Results    Endocervix curettage: [facility]    04/22/2018 Initial Diagnosis    Malignant neoplasm of endocervix ([place])    04/24/2018 PET scan    1. There is a large mass involving the cervix and endometrium which has a maximum dimension of 5.3 cm within SUV max of 20.4. 2. Small bilateral pelvic sidewall lymph nodes measuring less than 1cm with mild nonspecific uptake. The no hypermetabolic adenopathy or evidence of distant metastatic disease. 3. Nonspecific pulmonary nodules measuring less than 5 mm are identified in the right lung. Too small to characterize by PET-CT.     04/27/2018 Imaging    MRI pelvis  1. Uterine cervix 5.5 x 3.4 x 3.2 cm mass compatible with primary cervical malignancy, with mild parametrial invasion. Stage IIB by MRI. 2. Small volume simple free fluid in the pelvic cul-de-sac. 3. No pathologically enlarged pelvic lymph nodes, see comments.    05/08/2018 Procedure    Placement of a subcutaneous port device. Catheter tip at the superior cavoatrial junction.  05/15/2018 -  Chemotherapy    The patient had weekly cisplatin     REVIEW OF SYSTEMS:   Constitutional: Denies fevers, chills or abnormal weight loss Eyes: Denies blurriness of vision Ears, nose, mouth, throat, and face: Denies mucositis or sore throat Respiratory: Denies  cough, dyspnea or wheezes Cardiovascular: Denies palpitation, chest discomfort or lower extremity swelling Skin: Denies abnormal skin rashes Lymphatics: Denies new lymphadenopathy or easy bruising Neurological:Denies numbness, tingling or new weaknesses Behavioral/Psych: Mood is stable, no new changes  All other systems were reviewed with the patient and are negative.  I have reviewed the past medical history, past surgical history, social history and family history with the patient and they are unchanged from previous note.  ALLERGIES:  has No Known Allergies.  MEDICATIONS:  Current Outpatient Medications  Medication Sig Dispense Refill  . acetaminophen (TYLENOL) 500 MG tablet Take 1,000 mg by mouth every 8 (eight) hours as needed.    . lidocaine-prilocaine (EMLA) cream Apply to affected area once 30 g 3  . magnesium oxide (MAG-OX) 400 (241.3 Mg) MG tablet Take 1 tablet (400 mg total) by mouth 2 (two) times daily. 30 tablet 1  . morphine (MSIR) 15 MG tablet Take 1 tablet (15 mg total) by mouth every 6 (six) hours as needed for severe pain. 30 tablet 0  . ondansetron (ZOFRAN) 8 MG tablet Take 1 tablet (8 mg total) by mouth every 8 (eight) hours as needed. Start on the third day after chemotherapy. 30 tablet 1  . polyethylene glycol (MIRALAX / GLYCOLAX) packet Take 17 g by mouth daily.    . prochlorperazine (COMPAZINE) 10 MG tablet Take 1 tablet (10 mg total) by mouth every 6 (six) hours as needed (Nausea or vomiting). 30 tablet 1  . SUMAtriptan (IMITREX) 100 MG tablet Take 1 tablet (100 mg total) by mouth once for 1 dose. May repeat in 2 hours if no relief. 10 tablet 11   No current facility-administered medications for this visit.     PHYSICAL EXAMINATION: ECOG PERFORMANCE STATUS: 1 - Symptomatic but completely ambulatory  Vitals:   06/11/18 1030  BP: 116/72  Pulse: 87  Resp: 17  Temp: 98.1 F (36.7 C)  SpO2: 100%   Filed Weights   06/11/18 1030  Weight: 147 lb (66.7 kg)     GENERAL:alert, no distress and comfortable Musculoskeletal:no cyanosis of digits and no clubbing  NEURO: alert & oriented x 3 with fluent speech, no focal motor/sensory deficits  LABORATORY DATA:  I have reviewed the data as listed    Component Value Date/Time   NA 137 06/10/2018 1009   K 3.6 06/10/2018 1009   CL 102 06/10/2018 1009   CO2 26 06/10/2018 1009   GLUCOSE 106 (H) 06/10/2018 1009   BUN 11 06/10/2018 1009   CREATININE 0.79 06/10/2018 1009   CALCIUM 8.8 (L) 06/10/2018 1009   PROT 6.5 06/10/2018 1009   ALBUMIN 3.5 06/10/2018 1009   AST 15 06/10/2018 1009   ALT 19 06/10/2018 1009   ALKPHOS 76 06/10/2018 1009   BILITOT 0.3 06/10/2018 1009   GFRNONAA >60 06/10/2018 1009   GFRAA >60 06/10/2018 1009    No results found for: SPEP, UPEP  Lab Results  Component Value Date   WBC 1.6 (L) 06/10/2018   NEUTROABS 0.9 (L) 06/10/2018   HGB 9.8 (L) 06/10/2018   HCT 29.3 (L) 06/10/2018   MCV 91.0 06/10/2018   PLT 103 (L) 06/10/2018      Chemistry  Component Value Date/Time   NA 137 06/10/2018 1009   K 3.6 06/10/2018 1009   CL 102 06/10/2018 1009   CO2 26 06/10/2018 1009   BUN 11 06/10/2018 1009   CREATININE 0.79 06/10/2018 1009      Component Value Date/Time   CALCIUM 8.8 (L) 06/10/2018 1009   ALKPHOS 76 06/10/2018 1009   AST 15 06/10/2018 1009   ALT 19 06/10/2018 1009   BILITOT 0.3 06/10/2018 1009       All questions were answered. The patient knows to call the clinic with any problems, questions or concerns. No barriers to learning was detected.  I spent 20 minutes counseling the patient face to face. The total time spent in the appointment was 25 minutes and more than 50% was on counseling and review of test results  Heath Lark, MD 06/11/2018 1:04 PM

## 2018-06-11 NOTE — Telephone Encounter (Signed)
Scheduled appt per 4/30 sch message - unable to reach patient . Left message with apt date and time

## 2018-06-12 ENCOUNTER — Ambulatory Visit
Admission: RE | Admit: 2018-06-12 | Discharge: 2018-06-12 | Disposition: A | Discharge status: Home / Self Care | Payer: Self-pay | Source: Ambulatory Visit | Attending: Radiation Oncology | Admitting: Radiation Oncology

## 2018-06-12 ENCOUNTER — Other Ambulatory Visit: Payer: Self-pay

## 2018-06-12 ENCOUNTER — Inpatient Hospital Stay: Payer: Self-pay | Attending: Gynecologic Oncology

## 2018-06-12 ENCOUNTER — Ambulatory Visit: Payer: Self-pay

## 2018-06-12 VITALS — BP 100/78 | HR 73 | Temp 98.8°F | Resp 18

## 2018-06-12 DIAGNOSIS — C530 Malignant neoplasm of endocervix: Secondary | ICD-10-CM | POA: Insufficient documentation

## 2018-06-12 DIAGNOSIS — Z5189 Encounter for other specified aftercare: Secondary | ICD-10-CM | POA: Insufficient documentation

## 2018-06-12 DIAGNOSIS — N898 Other specified noninflammatory disorders of vagina: Secondary | ICD-10-CM | POA: Insufficient documentation

## 2018-06-12 DIAGNOSIS — Z5111 Encounter for antineoplastic chemotherapy: Secondary | ICD-10-CM | POA: Insufficient documentation

## 2018-06-12 DIAGNOSIS — R110 Nausea: Secondary | ICD-10-CM | POA: Insufficient documentation

## 2018-06-12 DIAGNOSIS — R194 Change in bowel habit: Secondary | ICD-10-CM | POA: Insufficient documentation

## 2018-06-12 DIAGNOSIS — Z510 Encounter for antineoplastic radiation therapy: Secondary | ICD-10-CM | POA: Insufficient documentation

## 2018-06-12 DIAGNOSIS — D61818 Other pancytopenia: Secondary | ICD-10-CM | POA: Insufficient documentation

## 2018-06-12 MED ORDER — TBO-FILGRASTIM 300 MCG/0.5ML ~~LOC~~ SOSY
PREFILLED_SYRINGE | SUBCUTANEOUS | Status: AC
  Filled 2018-06-12: qty 0.5

## 2018-06-12 MED ORDER — TBO-FILGRASTIM 300 MCG/0.5ML ~~LOC~~ SOSY
300.0000 ug | PREFILLED_SYRINGE | Freq: Once | SUBCUTANEOUS | Status: AC
  Administered 2018-06-12: 300 ug via SUBCUTANEOUS

## 2018-06-12 NOTE — Patient Instructions (Signed)
Tbo-Filgrastim injection What is this medicine? TBO-FILGRASTIM (T B O fil GRA stim) is a granulocyte colony-stimulating factor that stimulates the growth of neutrophils, a type of white blood cell important in the body's fight against infection. It is used to reduce the incidence of fever and infection in patients with certain types of cancer who are receiving chemotherapy that affects the bone marrow. This medicine may be used for other purposes; ask your health care provider or pharmacist if you have questions. COMMON BRAND NAME(S): Granix What should I tell my health care provider before I take this medicine? They need to know if you have any of these conditions: -bone scan or tests planned -kidney disease -sickle cell anemia -an unusual or allergic reaction to tbo-filgrastim, filgrastim, pegfilgrastim, other medicines, foods, dyes, or preservatives -pregnant or trying to get pregnant -breast-feeding How should I use this medicine? This medicine is for injection under the skin. If you get this medicine at home, you will be taught how to prepare and give this medicine. Refer to the Instructions for Use that come with your medication packaging. Use exactly as directed. Take your medicine at regular intervals. Do not take your medicine more often than directed. It is important that you put your used needles and syringes in a special sharps container. Do not put them in a trash can. If you do not have a sharps container, call your pharmacist or healthcare provider to get one. Talk to your pediatrician regarding the use of this medicine in children. While this drug may be prescribed for children as young as 1 month of age for selected conditions, precautions do apply. Overdosage: If you think you have taken too much of this medicine contact a poison control center or emergency room at once. NOTE: This medicine is only for you. Do not share this medicine with others. What if I miss a dose? It is  important not to miss your dose. Call your doctor or health care professional if you miss a dose. What may interact with this medicine? This medicine may interact with the following medications: -medicines that may cause a release of neutrophils, such as lithium This list may not describe all possible interactions. Give your health care provider a list of all the medicines, herbs, non-prescription drugs, or dietary supplements you use. Also tell them if you smoke, drink alcohol, or use illegal drugs. Some items may interact with your medicine. What should I watch for while using this medicine? You may need blood work done while you are taking this medicine. What side effects may I notice from receiving this medicine? Side effects that you should report to your doctor or health care professional as soon as possible: -allergic reactions like skin rash, itching or hives, swelling of the face, lips, or tongue -back pain -blood in the urine -dark urine -dizziness -fast heartbeat -feeling faint -shortness of breath or breathing problems -signs and symptoms of infection like fever or chills; cough; or sore throat -signs and symptoms of kidney injury like trouble passing urine or change in the amount of urine -stomach or side pain, or pain at the shoulder -sweating -swelling of the legs, ankles, or abdomen -tiredness Side effects that usually do not require medical attention (report to your doctor or health care professional if they continue or are bothersome): -bone pain -diarrhea -headache -muscle pain -vomiting This list may not describe all possible side effects. Call your doctor for medical advice about side effects. You may report side effects to FDA at   1-800-FDA-1088. Where should I keep my medicine? Keep out of the reach of children. Store in a refrigerator between 2 and 8 degrees C (36 and 46 degrees F). Keep in carton to protect from light. Throw away this medicine if it is left out  of the refrigerator for more than 5 consecutive days. Throw away any unused medicine after the expiration date. NOTE: This sheet is a summary. It may not cover all possible information. If you have questions about this medicine, talk to your doctor, pharmacist, or health care provider.  2019 Elsevier/Gold Standard (2016-09-17 16:56:18)  

## 2018-06-15 ENCOUNTER — Ambulatory Visit: Payer: Self-pay

## 2018-06-15 ENCOUNTER — Ambulatory Visit
Admission: RE | Admit: 2018-06-15 | Discharge: 2018-06-15 | Disposition: A | Discharge status: Home / Self Care | Payer: Self-pay | Source: Ambulatory Visit | Attending: Radiation Oncology | Admitting: Radiation Oncology

## 2018-06-15 ENCOUNTER — Inpatient Hospital Stay: Payer: Self-pay

## 2018-06-15 ENCOUNTER — Other Ambulatory Visit: Payer: Self-pay

## 2018-06-15 DIAGNOSIS — C530 Malignant neoplasm of endocervix: Secondary | ICD-10-CM

## 2018-06-15 LAB — CBC WITH DIFFERENTIAL/PLATELET
Abs Immature Granulocytes: 0.01 10*3/uL (ref 0.00–0.07)
Basophils Absolute: 0 10*3/uL (ref 0.0–0.1)
Basophils Relative: 1 %
Eosinophils Absolute: 0.1 10*3/uL (ref 0.0–0.5)
Eosinophils Relative: 6 %
HCT: 25.3 % — ABNORMAL LOW (ref 36.0–46.0)
Hemoglobin: 8.3 g/dL — ABNORMAL LOW (ref 12.0–15.0)
Immature Granulocytes: 1 %
Lymphocytes Relative: 16 %
Lymphs Abs: 0.3 10*3/uL — ABNORMAL LOW (ref 0.7–4.0)
MCH: 31 pg (ref 26.0–34.0)
MCHC: 32.8 g/dL (ref 30.0–36.0)
MCV: 94.4 fL (ref 80.0–100.0)
Monocytes Absolute: 0.3 10*3/uL (ref 0.1–1.0)
Monocytes Relative: 18 %
Neutro Abs: 1 10*3/uL — ABNORMAL LOW (ref 1.7–7.7)
Neutrophils Relative %: 58 %
Platelets: 66 10*3/uL — ABNORMAL LOW (ref 150–400)
RBC: 2.68 MIL/uL — ABNORMAL LOW (ref 3.87–5.11)
RDW: 12.6 % (ref 11.5–15.5)
WBC: 1.7 10*3/uL — ABNORMAL LOW (ref 4.0–10.5)
nRBC: 0 % (ref 0.0–0.2)

## 2018-06-15 LAB — COMPREHENSIVE METABOLIC PANEL
ALT: 11 U/L (ref 0–44)
AST: 13 U/L — ABNORMAL LOW (ref 15–41)
Albumin: 3.5 g/dL (ref 3.5–5.0)
Alkaline Phosphatase: 77 U/L (ref 38–126)
Anion gap: 8 (ref 5–15)
BUN: 7 mg/dL (ref 6–20)
CO2: 28 mmol/L (ref 22–32)
Calcium: 8.8 mg/dL — ABNORMAL LOW (ref 8.9–10.3)
Chloride: 102 mmol/L (ref 98–111)
Creatinine, Ser: 0.78 mg/dL (ref 0.44–1.00)
GFR calc Af Amer: >60 mL/min (ref >60)
GFR calc non Af Amer: >60 mL/min (ref >60)
Glucose, Bld: 106 mg/dL — ABNORMAL HIGH (ref 70–99)
Potassium: 3.8 mmol/L (ref 3.5–5.1)
Sodium: 138 mmol/L (ref 135–145)
Total Bilirubin: <0.2 mg/dL — ABNORMAL LOW (ref 0.3–1.2)
Total Protein: 6.2 g/dL — ABNORMAL LOW (ref 6.5–8.1)

## 2018-06-15 LAB — MAGNESIUM: Magnesium: 1.7 mg/dL (ref 1.7–2.4)

## 2018-06-15 MED ORDER — TBO-FILGRASTIM 300 MCG/0.5ML ~~LOC~~ SOSY
PREFILLED_SYRINGE | SUBCUTANEOUS | Status: AC
  Filled 2018-06-15: qty 0.5

## 2018-06-15 MED ORDER — TBO-FILGRASTIM 300 MCG/0.5ML ~~LOC~~ SOSY
300.0000 ug | PREFILLED_SYRINGE | Freq: Once | SUBCUTANEOUS | Status: AC
  Administered 2018-06-15: 11:00:00 300 ug via SUBCUTANEOUS

## 2018-06-16 ENCOUNTER — Inpatient Hospital Stay: Payer: Self-pay

## 2018-06-16 ENCOUNTER — Ambulatory Visit
Admission: RE | Admit: 2018-06-16 | Discharge: 2018-06-16 | Disposition: A | Discharge status: Home / Self Care | Payer: Self-pay | Source: Ambulatory Visit | Attending: Radiation Oncology | Admitting: Radiation Oncology

## 2018-06-16 ENCOUNTER — Ambulatory Visit: Payer: Self-pay

## 2018-06-16 ENCOUNTER — Other Ambulatory Visit: Payer: Self-pay

## 2018-06-16 DIAGNOSIS — C530 Malignant neoplasm of endocervix: Secondary | ICD-10-CM

## 2018-06-16 MED ORDER — TBO-FILGRASTIM 300 MCG/0.5ML ~~LOC~~ SOSY
300.0000 ug | PREFILLED_SYRINGE | Freq: Once | SUBCUTANEOUS | Status: AC
  Administered 2018-06-16: 300 ug via SUBCUTANEOUS

## 2018-06-16 MED ORDER — TBO-FILGRASTIM 300 MCG/0.5ML ~~LOC~~ SOSY
PREFILLED_SYRINGE | SUBCUTANEOUS | Status: AC
  Filled 2018-06-16: qty 0.5

## 2018-06-16 NOTE — Patient Instructions (Signed)
Tbo-Filgrastim injection What is this medicine? TBO-FILGRASTIM (T B O fil GRA stim) is a granulocyte colony-stimulating factor that stimulates the growth of neutrophils, a type of white blood cell important in the body's fight against infection. It is used to reduce the incidence of fever and infection in patients with certain types of cancer who are receiving chemotherapy that affects the bone marrow. This medicine may be used for other purposes; ask your health care provider or pharmacist if you have questions. COMMON BRAND NAME(S): Granix What should I tell my health care provider before I take this medicine? They need to know if you have any of these conditions: -bone scan or tests planned -kidney disease -sickle cell anemia -an unusual or allergic reaction to tbo-filgrastim, filgrastim, pegfilgrastim, other medicines, foods, dyes, or preservatives -pregnant or trying to get pregnant -breast-feeding How should I use this medicine? This medicine is for injection under the skin. If you get this medicine at home, you will be taught how to prepare and give this medicine. Refer to the Instructions for Use that come with your medication packaging. Use exactly as directed. Take your medicine at regular intervals. Do not take your medicine more often than directed. It is important that you put your used needles and syringes in a special sharps container. Do not put them in a trash can. If you do not have a sharps container, call your pharmacist or healthcare provider to get one. Talk to your pediatrician regarding the use of this medicine in children. While this drug may be prescribed for children as young as 1 month of age for selected conditions, precautions do apply. Overdosage: If you think you have taken too much of this medicine contact a poison control center or emergency room at once. NOTE: This medicine is only for you. Do not share this medicine with others. What if I miss a dose? It is  important not to miss your dose. Call your doctor or health care professional if you miss a dose. What may interact with this medicine? This medicine may interact with the following medications: -medicines that may cause a release of neutrophils, such as lithium This list may not describe all possible interactions. Give your health care provider a list of all the medicines, herbs, non-prescription drugs, or dietary supplements you use. Also tell them if you smoke, drink alcohol, or use illegal drugs. Some items may interact with your medicine. What should I watch for while using this medicine? You may need blood work done while you are taking this medicine. What side effects may I notice from receiving this medicine? Side effects that you should report to your doctor or health care professional as soon as possible: -allergic reactions like skin rash, itching or hives, swelling of the face, lips, or tongue -back pain -blood in the urine -dark urine -dizziness -fast heartbeat -feeling faint -shortness of breath or breathing problems -signs and symptoms of infection like fever or chills; cough; or sore throat -signs and symptoms of kidney injury like trouble passing urine or change in the amount of urine -stomach or side pain, or pain at the shoulder -sweating -swelling of the legs, ankles, or abdomen -tiredness Side effects that usually do not require medical attention (report to your doctor or health care professional if they continue or are bothersome): -bone pain -diarrhea -headache -muscle pain -vomiting This list may not describe all possible side effects. Call your doctor for medical advice about side effects. You may report side effects to FDA at   1-800-FDA-1088. Where should I keep my medicine? Keep out of the reach of children. Store in a refrigerator between 2 and 8 degrees C (36 and 46 degrees F). Keep in carton to protect from light. Throw away this medicine if it is left out  of the refrigerator for more than 5 consecutive days. Throw away any unused medicine after the expiration date. NOTE: This sheet is a summary. It may not cover all possible information. If you have questions about this medicine, talk to your doctor, pharmacist, or health care provider.  2019 Elsevier/Gold Standard (2016-09-17 16:56:18)  

## 2018-06-17 ENCOUNTER — Ambulatory Visit: Payer: Self-pay

## 2018-06-17 ENCOUNTER — Other Ambulatory Visit: Payer: Self-pay

## 2018-06-17 ENCOUNTER — Ambulatory Visit
Admission: RE | Admit: 2018-06-17 | Discharge: 2018-06-17 | Disposition: A | Discharge status: Home / Self Care | Payer: Self-pay | Source: Ambulatory Visit | Attending: Radiation Oncology | Admitting: Radiation Oncology

## 2018-06-17 ENCOUNTER — Telehealth: Payer: Self-pay | Admitting: Oncology

## 2018-06-17 ENCOUNTER — Inpatient Hospital Stay: Payer: Self-pay

## 2018-06-17 DIAGNOSIS — C530 Malignant neoplasm of endocervix: Secondary | ICD-10-CM

## 2018-06-17 LAB — CBC WITH DIFFERENTIAL/PLATELET
Abs Immature Granulocytes: 0.37 10*3/uL — ABNORMAL HIGH (ref 0.00–0.07)
Basophils Absolute: 0 10*3/uL (ref 0.0–0.1)
Basophils Relative: 0 %
Eosinophils Absolute: 0.1 10*3/uL (ref 0.0–0.5)
Eosinophils Relative: 3 %
HCT: 26.4 % — ABNORMAL LOW (ref 36.0–46.0)
Hemoglobin: 8.5 g/dL — ABNORMAL LOW (ref 12.0–15.0)
Immature Granulocytes: 8 %
Lymphocytes Relative: 11 %
Lymphs Abs: 0.5 10*3/uL — ABNORMAL LOW (ref 0.7–4.0)
MCH: 30.7 pg (ref 26.0–34.0)
MCHC: 32.2 g/dL (ref 30.0–36.0)
MCV: 95.3 fL (ref 80.0–100.0)
Monocytes Absolute: 0.4 10*3/uL (ref 0.1–1.0)
Monocytes Relative: 9 %
Neutro Abs: 3.1 10*3/uL (ref 1.7–7.7)
Neutrophils Relative %: 69 %
Platelets: 66 10*3/uL — ABNORMAL LOW (ref 150–400)
RBC: 2.77 MIL/uL — ABNORMAL LOW (ref 3.87–5.11)
RDW: 13.2 % (ref 11.5–15.5)
WBC: 4.5 10*3/uL (ref 4.0–10.5)
nRBC: 0 % (ref 0.0–0.2)

## 2018-06-17 LAB — COMPREHENSIVE METABOLIC PANEL
ALT: 10 U/L (ref 0–44)
AST: 11 U/L — ABNORMAL LOW (ref 15–41)
Albumin: 3.4 g/dL — ABNORMAL LOW (ref 3.5–5.0)
Alkaline Phosphatase: 81 U/L (ref 38–126)
Anion gap: 7 (ref 5–15)
BUN: 9 mg/dL (ref 6–20)
CO2: 27 mmol/L (ref 22–32)
Calcium: 8.8 mg/dL — ABNORMAL LOW (ref 8.9–10.3)
Chloride: 104 mmol/L (ref 98–111)
Creatinine, Ser: 0.78 mg/dL (ref 0.44–1.00)
GFR calc Af Amer: >60 mL/min (ref >60)
GFR calc non Af Amer: >60 mL/min (ref >60)
Glucose, Bld: 87 mg/dL (ref 70–99)
Potassium: 4.6 mmol/L (ref 3.5–5.1)
Sodium: 138 mmol/L (ref 135–145)
Total Bilirubin: 0.3 mg/dL (ref 0.3–1.2)
Total Protein: 6.1 g/dL — ABNORMAL LOW (ref 6.5–8.1)

## 2018-06-17 LAB — MAGNESIUM: Magnesium: 1.8 mg/dL (ref 1.7–2.4)

## 2018-06-17 NOTE — Telephone Encounter (Signed)
Called patient and advised her that she doesn't need Granix today and that Dr. Alvy Bimler will review her plan of care at the appointment tomorrow.  She verbalized agreement and understanding.

## 2018-06-18 ENCOUNTER — Telehealth: Payer: Self-pay | Admitting: Hematology and Oncology

## 2018-06-18 ENCOUNTER — Other Ambulatory Visit: Payer: Self-pay

## 2018-06-18 ENCOUNTER — Inpatient Hospital Stay (HOSPITAL_BASED_OUTPATIENT_CLINIC_OR_DEPARTMENT_OTHER): Payer: Self-pay | Admitting: Hematology and Oncology

## 2018-06-18 ENCOUNTER — Encounter: Payer: Self-pay | Admitting: Hematology and Oncology

## 2018-06-18 ENCOUNTER — Ambulatory Visit
Admission: RE | Admit: 2018-06-18 | Discharge: 2018-06-18 | Disposition: A | Discharge status: Home / Self Care | Payer: Self-pay | Source: Ambulatory Visit | Attending: Radiation Oncology | Admitting: Radiation Oncology

## 2018-06-18 ENCOUNTER — Ambulatory Visit: Payer: Self-pay

## 2018-06-18 VITALS — BP 108/70 | HR 76 | Temp 98.1°F | Resp 18 | Ht 62.0 in | Wt 147.6 lb

## 2018-06-18 DIAGNOSIS — T451X5A Adverse effect of antineoplastic and immunosuppressive drugs, initial encounter: Secondary | ICD-10-CM

## 2018-06-18 DIAGNOSIS — N898 Other specified noninflammatory disorders of vagina: Secondary | ICD-10-CM

## 2018-06-18 DIAGNOSIS — C530 Malignant neoplasm of endocervix: Secondary | ICD-10-CM

## 2018-06-18 DIAGNOSIS — D61818 Other pancytopenia: Secondary | ICD-10-CM

## 2018-06-18 DIAGNOSIS — R110 Nausea: Secondary | ICD-10-CM

## 2018-06-18 NOTE — Assessment & Plan Note (Signed)
She has severe pancytopenia due to recent treatment She has responded well to G-CSF support She does not need transfusion support right now I plan to check her blood count every week for the next 2 weeks and to continue to provide G-CSF support as needed

## 2018-06-18 NOTE — Assessment & Plan Note (Signed)
Her nausea control has improved with scheduled antiemetics She will continue the same

## 2018-06-18 NOTE — Assessment & Plan Note (Addendum)
The hypomagnesemia has resolved with oral magnesium supplement She will continue the same

## 2018-06-18 NOTE — Telephone Encounter (Signed)
Scheduled appt per 5/7 sch message  - unable to reach patient. Left message with appts date and time per sch message.

## 2018-06-18 NOTE — Progress Notes (Signed)
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[facility]) I have reviewed her blood work with the patient She has some mild symptomatic fatigue likely due to progressive pancytopenia She will continue radiation therapy but I will cancel her treatment tomorrow and rescheduled to next week  Chemotherapy-induced nausea Her nausea control has improved with scheduled antiemetics She will continue the same  Pancytopenia, acquired [facility]) She has severe pancytopenia due to recent treatment She has responded well to G-CSF support She does not need transfusion support right now I plan to check her blood count every week for the next 2 weeks and to continue to provide G-CSF support as needed  Hypomagnesemia The hypomagnesemia has resolved with oral magnesium supplement She will continue the same   Orders Placed This Encounter  Procedures  . Sample to Blood Bank    Standing Status:   Standing    Number of Occurrences:   33    Standing Expiration Date:   06/18/2019    INTERVAL HISTORY: Please see below for problem oriented charting. She is seen prior to cycle 5 of chemotherapy Unfortunately, her blood counts are still low She denies infection, fever or chills She has minor bone aches with G-CSF support that resolved She denies significant nausea She continues to have intermittent loose stool, well controlled with Imodium She has minimum vaginal discharge The patient denies any recent signs or symptoms of bleeding such as spontaneous epistaxis, hematuria or hematochezia.   SUMMARY OF ONCOLOGIC HISTORY:   Malignant neoplasm of endocervix ([place])   09/15/2017 Initial Diagnosis    The patient reports postcoital bleeding since August, 2019 and postmenopausal bleeding since November, 2019. 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     04/16/2018 Surgery    She was taken to the OR on 04/16/18 for a cervical cone biopsy. The specimen revealed a grossly normal cervix on the ectocervix.  Cone specimen revealed squamous cell carcinoma.  When discussing this with the pathologist he reported that in the actual cone specimen itself that was CIN 3 however there was a separate fragment of tissue that included carcinoma.  The dimensions of this was 0.6 cm, however this involved the margins.  A post cone ECC was positive for squamous cell carcinoma, as well as an endometrial curettage which also contain benign endometrial glands.     04/16/2018 Pathology Results    Endocervix curettage: [facility]    04/22/2018 Initial Diagnosis    Malignant neoplasm of endocervix ([place])    04/24/2018 PET scan    1. There is a large mass involving the cervix and endometrium which has a maximum dimension of 5.3 cm within SUV max of 20.4. 2. Small bilateral pelvic sidewall lymph nodes measuring less than 1cm with mild nonspecific uptake. The no hypermetabolic adenopathy or evidence of distant metastatic disease. 3. Nonspecific pulmonary nodules measuring less than 5 mm are identified in the right lung. Too small to characterize by PET-CT.     04/27/2018 Imaging    MRI pelvis  1. Uterine cervix 5.5 x 3.4 x 3.2 cm mass compatible with primary cervical malignancy, with mild parametrial invasion. Stage IIB by MRI. 2. Small volume simple free fluid in the pelvic cul-de-sac. 3. No pathologically enlarged pelvic lymph nodes, see comments.    05/08/2018  Procedure    Placement of a subcutaneous port device. Catheter tip at the superior cavoatrial junction.    05/15/2018 -  Chemotherapy    The patient had weekly cisplatin     REVIEW OF SYSTEMS:   Constitutional: Denies fevers, chills or abnormal weight loss Eyes: Denies blurriness of vision Ears, nose, mouth, throat, and face: Denies mucositis or sore  throat Respiratory: Denies cough, dyspnea or wheezes Cardiovascular: Denies palpitation, chest discomfort or lower extremity swelling Skin: Denies abnormal skin rashes Lymphatics: Denies new lymphadenopathy or easy bruising Neurological:Denies numbness, tingling or new weaknesses Behavioral/Psych: Mood is stable, no new changes  All other systems were reviewed with the patient and are negative.  I have reviewed the past medical history, past surgical history, social history and family history with the patient and they are unchanged from previous note.  ALLERGIES:  has No Known Allergies.  MEDICATIONS:  Current Outpatient Medications  Medication Sig Dispense Refill  . acetaminophen (TYLENOL) 500 MG tablet Take 1,000 mg by mouth every 8 (eight) hours as needed.    . lidocaine-prilocaine (EMLA) cream Apply to affected area once 30 g 3  . magnesium oxide (MAG-OX) 400 (241.3 Mg) MG tablet Take 1 tablet (400 mg total) by mouth 2 (two) times daily. 30 tablet 1  . morphine (MSIR) 15 MG tablet Take 1 tablet (15 mg total) by mouth every 6 (six) hours as needed for severe pain. 30 tablet 0  . ondansetron (ZOFRAN) 8 MG tablet Take 1 tablet (8 mg total) by mouth every 8 (eight) hours as needed. Start on the third day after chemotherapy. 30 tablet 1  . polyethylene glycol (MIRALAX / GLYCOLAX) packet Take 17 g by mouth daily.    . prochlorperazine (COMPAZINE) 10 MG tablet Take 1 tablet (10 mg total) by mouth every 6 (six) hours as needed (Nausea or vomiting). 30 tablet 1  . SUMAtriptan (IMITREX) 100 MG tablet Take 1 tablet (100 mg total) by mouth once for 1 dose. May repeat in 2 hours if no relief. 10 tablet 11   No current facility-administered medications for this visit.     PHYSICAL EXAMINATION: ECOG PERFORMANCE STATUS: 1 - Symptomatic but completely ambulatory  Vitals:   06/18/18 0852  BP: 108/70  Pulse: 76  Resp: 18  Temp: 98.1 F (36.7 C)  SpO2: 100%   Filed Weights   06/18/18 0852   Weight: 147 lb 9.6 oz (67 kg)    GENERAL:alert, no distress and comfortable.  She looks pale Musculoskeletal:no cyanosis of digits and no clubbing  NEURO: alert & oriented x 3 with fluent speech, no focal motor/sensory deficits  LABORATORY DATA:  I have reviewed the data as listed    Component Value Date/Time   NA 138 06/17/2018 0824   K 4.6 06/17/2018 0824   CL 104 06/17/2018 0824   CO2 27 06/17/2018 0824   GLUCOSE 87 06/17/2018 0824   BUN 9 06/17/2018 0824   CREATININE 0.78 06/17/2018 0824   CALCIUM 8.8 (L) 06/17/2018 0824   PROT 6.1 (L) 06/17/2018 0824   ALBUMIN 3.4 (L) 06/17/2018 0824   AST 11 (L) 06/17/2018 0824   ALT 10 06/17/2018 0824   ALKPHOS 81 06/17/2018 0824   BILITOT 0.3 06/17/2018 0824   GFRNONAA >60 06/17/2018 0824   GFRAA >60 06/17/2018 0824    No results found for: SPEP, UPEP  Lab Results  Component Value Date   WBC 4.5 06/17/2018   NEUTROABS 3.1 06/17/2018   HGB 8.5 (L) 06/17/2018  HCT 26.4 (L) 06/17/2018   MCV 95.3 06/17/2018   PLT 66 (L) 06/17/2018      Chemistry      Component Value Date/Time   NA 138 06/17/2018 0824   K 4.6 06/17/2018 0824   CL 104 06/17/2018 0824   CO2 27 06/17/2018 0824   BUN 9 06/17/2018 0824   CREATININE 0.78 06/17/2018 0824      Component Value Date/Time   CALCIUM 8.8 (L) 06/17/2018 0824   ALKPHOS 81 06/17/2018 0824   AST 11 (L) 06/17/2018 0824   ALT 10 06/17/2018 0824   BILITOT 0.3 06/17/2018 0824      All questions were answered. The patient knows to call the clinic with any problems, questions or concerns. No barriers to learning was detected.  I spent 25 minutes counseling the patient face to face. The total time spent in the appointment was 30 minutes and more than 50% was on counseling and review of test results  Heath Lark, MD 06/18/2018 1:43 PM

## 2018-06-18 NOTE — Assessment & Plan Note (Signed)
I have reviewed her blood work with the patient She has some mild symptomatic fatigue likely due to progressive pancytopenia She will continue radiation therapy but I will cancel her treatment tomorrow and rescheduled to next week

## 2018-06-19 ENCOUNTER — Ambulatory Visit: Payer: Self-pay

## 2018-06-19 ENCOUNTER — Ambulatory Visit
Admission: RE | Admit: 2018-06-19 | Discharge: 2018-06-19 | Disposition: A | Discharge status: Home / Self Care | Payer: Self-pay | Source: Ambulatory Visit | Attending: Radiation Oncology | Admitting: Radiation Oncology

## 2018-06-19 ENCOUNTER — Other Ambulatory Visit: Payer: Self-pay

## 2018-06-22 ENCOUNTER — Telehealth: Payer: Self-pay

## 2018-06-22 ENCOUNTER — Telehealth: Payer: Self-pay | Admitting: Oncology

## 2018-06-22 ENCOUNTER — Other Ambulatory Visit: Payer: Self-pay

## 2018-06-22 ENCOUNTER — Ambulatory Visit: Payer: Self-pay

## 2018-06-22 ENCOUNTER — Inpatient Hospital Stay: Payer: Self-pay

## 2018-06-22 ENCOUNTER — Ambulatory Visit
Admission: RE | Admit: 2018-06-22 | Discharge: 2018-06-22 | Disposition: A | Discharge status: Home / Self Care | Payer: Self-pay | Source: Ambulatory Visit | Attending: Radiation Oncology | Admitting: Radiation Oncology

## 2018-06-22 VITALS — BP 119/73 | HR 85 | Temp 99.1°F | Resp 18

## 2018-06-22 DIAGNOSIS — C530 Malignant neoplasm of endocervix: Secondary | ICD-10-CM

## 2018-06-22 DIAGNOSIS — D61818 Other pancytopenia: Secondary | ICD-10-CM

## 2018-06-22 LAB — COMPREHENSIVE METABOLIC PANEL
ALT: 12 U/L (ref 0–44)
AST: 12 U/L — ABNORMAL LOW (ref 15–41)
Albumin: 3.7 g/dL (ref 3.5–5.0)
Alkaline Phosphatase: 79 U/L (ref 38–126)
Anion gap: 9 (ref 5–15)
BUN: 10 mg/dL (ref 6–20)
CO2: 26 mmol/L (ref 22–32)
Calcium: 9 mg/dL (ref 8.9–10.3)
Chloride: 103 mmol/L (ref 98–111)
Creatinine, Ser: 0.82 mg/dL (ref 0.44–1.00)
GFR calc Af Amer: >60 mL/min (ref >60)
GFR calc non Af Amer: >60 mL/min (ref >60)
Glucose, Bld: 95 mg/dL (ref 70–99)
Potassium: 4.4 mmol/L (ref 3.5–5.1)
Sodium: 138 mmol/L (ref 135–145)
Total Bilirubin: 0.3 mg/dL (ref 0.3–1.2)
Total Protein: 6.5 g/dL (ref 6.5–8.1)

## 2018-06-22 LAB — CBC WITH DIFFERENTIAL/PLATELET
Abs Immature Granulocytes: 0.01 10*3/uL (ref 0.00–0.07)
Basophils Absolute: 0 10*3/uL (ref 0.0–0.1)
Basophils Relative: 1 %
Eosinophils Absolute: 0.1 10*3/uL (ref 0.0–0.5)
Eosinophils Relative: 8 %
HCT: 26.3 % — ABNORMAL LOW (ref 36.0–46.0)
Hemoglobin: 8.7 g/dL — ABNORMAL LOW (ref 12.0–15.0)
Immature Granulocytes: 1 %
Lymphocytes Relative: 28 %
Lymphs Abs: 0.4 10*3/uL — ABNORMAL LOW (ref 0.7–4.0)
MCH: 31.2 pg (ref 26.0–34.0)
MCHC: 33.1 g/dL (ref 30.0–36.0)
MCV: 94.3 fL (ref 80.0–100.0)
Monocytes Absolute: 0.1 10*3/uL (ref 0.1–1.0)
Monocytes Relative: 10 %
Neutro Abs: 0.8 10*3/uL — ABNORMAL LOW (ref 1.7–7.7)
Neutrophils Relative %: 52 %
Platelets: 126 10*3/uL — ABNORMAL LOW (ref 150–400)
RBC: 2.79 MIL/uL — ABNORMAL LOW (ref 3.87–5.11)
RDW: 14.3 % (ref 11.5–15.5)
WBC: 1.4 10*3/uL — ABNORMAL LOW (ref 4.0–10.5)
nRBC: 0 % (ref 0.0–0.2)

## 2018-06-22 LAB — SAMPLE TO BLOOD BANK

## 2018-06-22 LAB — MAGNESIUM: Magnesium: 1.8 mg/dL (ref 1.7–2.4)

## 2018-06-22 MED ORDER — TBO-FILGRASTIM 300 MCG/0.5ML ~~LOC~~ SOSY
300.0000 ug | PREFILLED_SYRINGE | Freq: Once | SUBCUTANEOUS | Status: AC
  Administered 2018-06-22: 10:00:00 300 ug via SUBCUTANEOUS

## 2018-06-22 MED ORDER — TBO-FILGRASTIM 300 MCG/0.5ML ~~LOC~~ SOSY
PREFILLED_SYRINGE | SUBCUTANEOUS | Status: AC
  Filled 2018-06-22: qty 0.5

## 2018-06-22 NOTE — Telephone Encounter (Signed)
Called and given CBC results and per Dr. Alvy Bimler she needs injection appt today and tomorrow. She verbalized understanding. Scheduling message sent to add appts.

## 2018-06-22 NOTE — Telephone Encounter (Signed)
Sheena Torres left a message asking what to expect at her pre-op appointment on 5/13.  Called her back and left a message explaining that the pre op nurses will give her instructions for the tandem and ring procedure and may draw labs.

## 2018-06-22 NOTE — Patient Instructions (Signed)
Tbo-Filgrastim injection What is this medicine? TBO-FILGRASTIM (T B O fil GRA stim) is a granulocyte colony-stimulating factor that stimulates the growth of neutrophils, a type of white blood cell important in the body's fight against infection. It is used to reduce the incidence of fever and infection in patients with certain types of cancer who are receiving chemotherapy that affects the bone marrow. This medicine may be used for other purposes; ask your health care provider or pharmacist if you have questions. COMMON BRAND NAME(S): Granix What should I tell my health care provider before I take this medicine? They need to know if you have any of these conditions: -bone scan or tests planned -kidney disease -sickle cell anemia -an unusual or allergic reaction to tbo-filgrastim, filgrastim, pegfilgrastim, other medicines, foods, dyes, or preservatives -pregnant or trying to get pregnant -breast-feeding How should I use this medicine? This medicine is for injection under the skin. If you get this medicine at home, you will be taught how to prepare and give this medicine. Refer to the Instructions for Use that come with your medication packaging. Use exactly as directed. Take your medicine at regular intervals. Do not take your medicine more often than directed. It is important that you put your used needles and syringes in a special sharps container. Do not put them in a trash can. If you do not have a sharps container, call your pharmacist or healthcare provider to get one. Talk to your pediatrician regarding the use of this medicine in children. While this drug may be prescribed for children as young as 1 month of age for selected conditions, precautions do apply. Overdosage: If you think you have taken too much of this medicine contact a poison control center or emergency room at once. NOTE: This medicine is only for you. Do not share this medicine with others. What if I miss a dose? It is  important not to miss your dose. Call your doctor or health care professional if you miss a dose. What may interact with this medicine? This medicine may interact with the following medications: -medicines that may cause a release of neutrophils, such as lithium This list may not describe all possible interactions. Give your health care provider a list of all the medicines, herbs, non-prescription drugs, or dietary supplements you use. Also tell them if you smoke, drink alcohol, or use illegal drugs. Some items may interact with your medicine. What should I watch for while using this medicine? You may need blood work done while you are taking this medicine. What side effects may I notice from receiving this medicine? Side effects that you should report to your doctor or health care professional as soon as possible: -allergic reactions like skin rash, itching or hives, swelling of the face, lips, or tongue -back pain -blood in the urine -dark urine -dizziness -fast heartbeat -feeling faint -shortness of breath or breathing problems -signs and symptoms of infection like fever or chills; cough; or sore throat -signs and symptoms of kidney injury like trouble passing urine or change in the amount of urine -stomach or side pain, or pain at the shoulder -sweating -swelling of the legs, ankles, or abdomen -tiredness Side effects that usually do not require medical attention (report to your doctor or health care professional if they continue or are bothersome): -bone pain -diarrhea -headache -muscle pain -vomiting This list may not describe all possible side effects. Call your doctor for medical advice about side effects. You may report side effects to FDA at   1-800-FDA-1088. Where should I keep my medicine? Keep out of the reach of children. Store in a refrigerator between 2 and 8 degrees C (36 and 46 degrees F). Keep in carton to protect from light. Throw away this medicine if it is left out  of the refrigerator for more than 5 consecutive days. Throw away any unused medicine after the expiration date. NOTE: This sheet is a summary. It may not cover all possible information. If you have questions about this medicine, talk to your doctor, pharmacist, or health care provider.  2019 Elsevier/Gold Standard (2016-09-17 16:56:18)  

## 2018-06-23 ENCOUNTER — Encounter: Payer: Self-pay | Admitting: Hematology and Oncology

## 2018-06-23 ENCOUNTER — Inpatient Hospital Stay (HOSPITAL_BASED_OUTPATIENT_CLINIC_OR_DEPARTMENT_OTHER): Payer: Self-pay | Admitting: Hematology and Oncology

## 2018-06-23 ENCOUNTER — Telehealth: Payer: Self-pay | Admitting: Oncology

## 2018-06-23 ENCOUNTER — Ambulatory Visit
Admission: RE | Admit: 2018-06-23 | Discharge: 2018-06-23 | Disposition: A | Discharge status: Home / Self Care | Payer: Self-pay | Source: Ambulatory Visit | Attending: Radiation Oncology | Admitting: Radiation Oncology

## 2018-06-23 ENCOUNTER — Ambulatory Visit: Payer: Self-pay

## 2018-06-23 ENCOUNTER — Other Ambulatory Visit: Payer: Self-pay

## 2018-06-23 DIAGNOSIS — C530 Malignant neoplasm of endocervix: Secondary | ICD-10-CM

## 2018-06-23 DIAGNOSIS — D61818 Other pancytopenia: Secondary | ICD-10-CM

## 2018-06-23 DIAGNOSIS — R197 Diarrhea, unspecified: Secondary | ICD-10-CM

## 2018-06-23 MED ORDER — TBO-FILGRASTIM 300 MCG/0.5ML ~~LOC~~ SOSY
PREFILLED_SYRINGE | SUBCUTANEOUS | Status: AC
  Filled 2018-06-23: qty 0.5

## 2018-06-23 MED ORDER — TBO-FILGRASTIM 300 MCG/0.5ML ~~LOC~~ SOSY
300.0000 ug | PREFILLED_SYRINGE | Freq: Once | SUBCUTANEOUS | Status: AC
  Administered 2018-06-23: 12:00:00 300 ug via SUBCUTANEOUS

## 2018-06-23 NOTE — Assessment & Plan Note (Signed)
She has severe pancytopenia due to recent treatment She does not need transfusion support She will receive another dose of Granix today with goal to keep her [place] greater than 1.5

## 2018-06-23 NOTE — Progress Notes (Signed)
[place] OFFICE PROGRESS NOTE  Patient Care Team: [name] as PCP - General (Physician Assistant)  ASSESSMENT & PLAN:  Malignant neoplasm of endocervix [facility]) I have reviewed her blood work with the patient She has some mild symptomatic fatigue likely due to progressive pancytopenia Her hemoglobin is stable and platelet count has improved Her white count is still low and she will receive another dose of G-CSF today I plan to recheck CBC tomorrow If her [place] improves tomorrow, she will receive final treatment dose this week with 50% dose adjustment We discussed the rationale of pursuing final dose of treatment and she agreed to proceed I will see her next week for further follow-up  Pancytopenia, acquired ([place]) She has severe pancytopenia due to recent treatment She does not need transfusion support She will receive another dose of Granix today with goal to keep her [place] greater than 1.5  Diarrhea She has significant diarrhea secondary to side effects of treatment She will continue Imodium as needed and increase hydration as tolerated   No orders of the defined types were placed in this encounter.   INTERVAL HISTORY: Please see below for problem oriented charting. She returns for further follow-up, to be seen prior to cycle 5 of chemotherapy She continues to feel fatigued Nausea and diarrhea are well controlled She denies recent fever or chills No cough No peripheral neuropathy The patient denies any recent signs or symptoms of bleeding such as spontaneous epistaxis, hematuria or hematochezia.  SUMMARY OF ONCOLOGIC HISTORY:   Malignant neoplasm of endocervix ([place])   09/15/2017 Initial Diagnosis    The patient reports postcoital bleeding since August, 2019 and postmenopausal bleeding since November, 2019. 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     04/16/2018  Surgery    She was taken to the OR on 04/16/18 for a cervical cone biopsy. The specimen revealed a grossly normal cervix on the ectocervix.  Cone specimen revealed squamous cell carcinoma.  When discussing this with the pathologist he reported that in the actual cone specimen itself that was CIN 3 however there was a separate fragment of tissue that included carcinoma.  The dimensions of this was 0.6 cm, however this involved the margins.  A post cone ECC was positive for squamous cell carcinoma, as well as an endometrial curettage which also contain benign endometrial glands.     04/16/2018 Pathology Results    Endocervix curettage: [facility]    04/22/2018 Initial Diagnosis    Malignant neoplasm of endocervix ([place])    04/24/2018 PET scan    1. There is a large mass involving the cervix and endometrium which has a maximum dimension of 5.3 cm within SUV max of 20.4. 2. Small bilateral pelvic sidewall lymph nodes measuring less than 1cm with mild nonspecific uptake. The no hypermetabolic adenopathy or evidence of distant metastatic disease. 3. Nonspecific pulmonary nodules measuring less than 5 mm are identified in the right lung. Too small to characterize by PET-CT.     04/27/2018 Imaging    MRI pelvis  1. Uterine cervix 5.5 x 3.4 x 3.2 cm mass compatible with primary cervical malignancy, with mild parametrial invasion. Stage IIB by MRI. 2. Small volume simple free fluid in the pelvic cul-de-sac. 3. No pathologically enlarged pelvic lymph nodes, see comments.    05/08/2018 Procedure    Placement of a subcutaneous port device. Catheter tip at the  superior cavoatrial junction.    05/15/2018 -  Chemotherapy    The patient had weekly cisplatin     REVIEW OF SYSTEMS:   Constitutional: Denies fevers, chills or abnormal weight loss Eyes: Denies blurriness of vision Ears, nose, mouth, throat, and face: Denies mucositis or sore throat Respiratory: Denies cough, dyspnea or wheezes Cardiovascular: Denies  palpitation, chest discomfort or lower extremity swelling Gastrointestinal:  Denies nausea, heartburn or change in bowel habits Skin: Denies abnormal skin rashes Lymphatics: Denies new lymphadenopathy or easy bruising Neurological:Denies numbness, tingling or new weaknesses Behavioral/Psych: Mood is stable, no new changes  All other systems were reviewed with the patient and are negative.  I have reviewed the past medical history, past surgical history, social history and family history with the patient and they are unchanged from previous note.  ALLERGIES:  has No Known Allergies.  MEDICATIONS:  Current Outpatient Medications  Medication Sig Dispense Refill  . acetaminophen (TYLENOL) 500 MG tablet Take 1,000 mg by mouth 3 (three) times daily.     Marland Kitchen lidocaine-prilocaine (EMLA) cream Apply to affected area once (Patient taking differently: Apply 1 application topically daily as needed (port access). ) 30 g 3  . loperamide (IMODIUM) 2 MG capsule Take 2 mg by mouth daily.    . magnesium oxide (MAG-OX) 400 (241.3 Mg) MG tablet Take 1 tablet (400 mg total) by mouth 2 (two) times daily. 30 tablet 1  . morphine (MSIR) 15 MG tablet Take 1 tablet (15 mg total) by mouth every 6 (six) hours as needed for severe pain. 30 tablet 0  . ondansetron (ZOFRAN) 8 MG tablet Take 1 tablet (8 mg total) by mouth every 8 (eight) hours as needed. Start on the third day after chemotherapy. 30 tablet 1  . Probiotic Product (ALIGN) 4 MG CAPS Take 4 mg by mouth daily.    . prochlorperazine (COMPAZINE) 10 MG tablet Take 1 tablet (10 mg total) by mouth every 6 (six) hours as needed (Nausea or vomiting). 30 tablet 1  . SUMAtriptan (IMITREX) 100 MG tablet Take 1 tablet (100 mg total) by mouth once for 1 dose. May repeat in 2 hours if no relief. (Patient taking differently: Take 100 mg by mouth every 2 (two) hours as needed for migraine. May repeat in 2 hours if no relief.) 10 tablet 11  . vitamin B-12 (CYANOCOBALAMIN) 500 MCG  tablet Take 1,000 mcg by mouth daily.     No current facility-administered medications for this visit.    Facility-Administered Medications Ordered in Other Visits  Medication Dose Route Frequency Provider Last Rate Last Dose  . Tbo-Filgrastim (GRANIX) injection 300 mcg  300 mcg Subcutaneous Once Alvy Bimler, Matie Dimaano, MD        PHYSICAL EXAMINATION: ECOG PERFORMANCE STATUS: 1 - Symptomatic but completely ambulatory  Vitals:   06/23/18 1121  BP: 104/64  Pulse: 73  Resp: 18  Temp: 99.2 F (37.3 C)  SpO2: 100%   Filed Weights   06/23/18 1121  Weight: 149 lb 6.4 oz (67.8 kg)    GENERAL:alert, no distress and comfortable SKIN: skin color, texture, turgor are normal, no rashes or significant lesions EYES: normal, Conjunctiva are pink and non-injected, sclera clear OROPHARYNX:no exudate, no erythema and lips, buccal mucosa, and tongue normal  NECK: supple, thyroid normal size, non-tender, without nodularity LYMPH:  no palpable lymphadenopathy in the cervical, axillary or inguinal LUNGS: clear to auscultation and percussion with normal breathing effort HEART: regular rate & rhythm and no murmurs and no lower extremity edema ABDOMEN:abdomen  soft, non-tender and normal bowel sounds Musculoskeletal:no cyanosis of digits and no clubbing  NEURO: alert & oriented x 3 with fluent speech, no focal motor/sensory deficits  LABORATORY DATA:  I have reviewed the data as listed    Component Value Date/Time   NA 138 06/22/2018 0927   K 4.4 06/22/2018 0927   CL 103 06/22/2018 0927   CO2 26 06/22/2018 0927   GLUCOSE 95 06/22/2018 0927   BUN 10 06/22/2018 0927   CREATININE 0.82 06/22/2018 0927   CALCIUM 9.0 06/22/2018 0927   PROT 6.5 06/22/2018 0927   ALBUMIN 3.7 06/22/2018 0927   AST 12 (L) 06/22/2018 0927   ALT 12 06/22/2018 0927   ALKPHOS 79 06/22/2018 0927   BILITOT 0.3 06/22/2018 0927   GFRNONAA >60 06/22/2018 0927   GFRAA >60 06/22/2018 0927    No results found for: SPEP, UPEP  Lab  Results  Component Value Date   WBC 1.4 (L) 06/22/2018   NEUTROABS 0.8 (L) 06/22/2018   HGB 8.7 (L) 06/22/2018   HCT 26.3 (L) 06/22/2018   MCV 94.3 06/22/2018   PLT 126 (L) 06/22/2018      Chemistry      Component Value Date/Time   NA 138 06/22/2018 0927   K 4.4 06/22/2018 0927   CL 103 06/22/2018 0927   CO2 26 06/22/2018 0927   BUN 10 06/22/2018 0927   CREATININE 0.82 06/22/2018 0927      Component Value Date/Time   CALCIUM 9.0 06/22/2018 0927   ALKPHOS 79 06/22/2018 0927   AST 12 (L) 06/22/2018 0927   ALT 12 06/22/2018 0927   BILITOT 0.3 06/22/2018 0927      All questions were answered. The patient knows to call the clinic with any problems, questions or concerns. No barriers to learning was detected.  I spent 20 minutes counseling the patient face to face. The total time spent in the appointment was 30 minutes and more than 50% was on counseling and review of test results  Heath Lark, MD 06/23/2018 11:33 AM

## 2018-06-23 NOTE — Assessment & Plan Note (Signed)
She has significant diarrhea secondary to side effects of treatment She will continue Imodium as needed and increase hydration as tolerated

## 2018-06-23 NOTE — Telephone Encounter (Signed)
Otho Bellows and discussed her preop appointment.  She is going to call back later to discuss the tandem and ring procedure.

## 2018-06-23 NOTE — Assessment & Plan Note (Signed)
I have reviewed her blood work with the patient She has some mild symptomatic fatigue likely due to progressive pancytopenia Her hemoglobin is stable and platelet count has improved Her white count is still low and she will receive another dose of G-CSF today I plan to recheck CBC tomorrow If her [place] improves tomorrow, she will receive final treatment dose this week with 50% dose adjustment We discussed the rationale of pursuing final dose of treatment and she agreed to proceed I will see her next week for further follow-up

## 2018-06-24 ENCOUNTER — Inpatient Hospital Stay: Payer: Self-pay

## 2018-06-24 ENCOUNTER — Encounter (HOSPITAL_BASED_OUTPATIENT_CLINIC_OR_DEPARTMENT_OTHER): Payer: Self-pay | Admitting: *Deleted

## 2018-06-24 ENCOUNTER — Telehealth: Payer: Self-pay

## 2018-06-24 ENCOUNTER — Other Ambulatory Visit: Payer: Self-pay

## 2018-06-24 ENCOUNTER — Encounter (HOSPITAL_COMMUNITY): Admission: RE | Admit: 2018-06-24 | Payer: Self-pay | Source: Ambulatory Visit

## 2018-06-24 ENCOUNTER — Ambulatory Visit: Payer: Self-pay

## 2018-06-24 ENCOUNTER — Ambulatory Visit
Admission: RE | Admit: 2018-06-24 | Discharge: 2018-06-24 | Disposition: A | Discharge status: Home / Self Care | Payer: Self-pay | Source: Ambulatory Visit | Attending: Radiation Oncology | Admitting: Radiation Oncology

## 2018-06-24 DIAGNOSIS — C530 Malignant neoplasm of endocervix: Secondary | ICD-10-CM

## 2018-06-24 LAB — CBC WITH DIFFERENTIAL/PLATELET
Abs Immature Granulocytes: 0 10*3/uL (ref 0.00–0.07)
Band Neutrophils: 38 %
Basophils Absolute: 0 10*3/uL (ref 0.0–0.1)
Basophils Relative: 0 %
Eosinophils Absolute: 0 10*3/uL (ref 0.0–0.5)
Eosinophils Relative: 0 %
HCT: 25.3 % — ABNORMAL LOW (ref 36.0–46.0)
Hemoglobin: 8.1 g/dL — ABNORMAL LOW (ref 12.0–15.0)
Lymphocytes Relative: 9 %
Lymphs Abs: 0.6 10*3/uL — ABNORMAL LOW (ref 0.7–4.0)
MCH: 31.3 pg (ref 26.0–34.0)
MCHC: 32 g/dL (ref 30.0–36.0)
MCV: 97.7 fL (ref 80.0–100.0)
Monocytes Absolute: 0.4 10*3/uL (ref 0.1–1.0)
Monocytes Relative: 5 %
Neutro Abs: 6.1 10*3/uL (ref 1.7–17.7)
Neutrophils Relative %: 48 %
Platelets: 171 10*3/uL (ref 150–400)
RBC: 2.59 MIL/uL — ABNORMAL LOW (ref 3.87–5.11)
RDW: 15.9 % — ABNORMAL HIGH (ref 11.5–15.5)
Smear Review: INCREASED
WBC: 7.1 10*3/uL (ref 4.0–10.5)
nRBC: 0.3 % — ABNORMAL HIGH (ref 0.0–0.2)

## 2018-06-24 NOTE — Progress Notes (Signed)
Spoke w/ pt via phone for pre-op interview.  Npo after mn.  Arrive at 0530.  Per pt last chemo treatment 06-26-2018 and getting lab work done a cancer center 06-29-2018.  Pt getting COVID 19 test done Monday 06-29-2018 @ 0945.  Pre-op orders pending.  Called and spoke w/ shirley, or scheduler for dr Sondra Come, for pre-op orders.

## 2018-06-24 NOTE — Progress Notes (Signed)
SPOKE W/  _  Pt via phone     SCREENING SYMPTOMS OF COVID 19:   COUGH-- no  RUNNY NOSE---  no  SORE THROAT--- no  NASAL CONGESTION---- no  SNEEZING---- no  SHORTNESS OF BREATH--- no  DIFFICULTY BREATHING--- no  TEMP >100.0 ----- no  UNEXPLAINED BODY ACHES------ no  CHILLS -------- no  HEADACHES --------- no  LOSS OF SMELL/ TASTE -------- no    HAVE YOU OR ANY FAMILY MEMBER TRAVELLED PAST 14 DAYS OUT OF THE   COUNTY--- no STATE---- no COUNTRY---- no  HAVE YOU OR ANY FAMILY MEMBER BEEN EXPOSED TO ANYONE WITH COVID 19?   Denies.

## 2018-06-24 NOTE — Telephone Encounter (Signed)
Called per Dr. Alvy Bimler and told no need for Granix today. Okay for chemo Friday as scheduled. CBC results given. She verbalized understanding.

## 2018-06-25 ENCOUNTER — Ambulatory Visit: Payer: Self-pay

## 2018-06-26 ENCOUNTER — Encounter: Payer: Self-pay | Admitting: Pharmacy Technician

## 2018-06-26 ENCOUNTER — Other Ambulatory Visit: Payer: Self-pay | Admitting: Hematology and Oncology

## 2018-06-26 ENCOUNTER — Inpatient Hospital Stay: Payer: Self-pay

## 2018-06-26 ENCOUNTER — Ambulatory Visit: Payer: Self-pay

## 2018-06-26 ENCOUNTER — Other Ambulatory Visit: Payer: Self-pay

## 2018-06-26 VITALS — BP 111/74 | HR 80 | Temp 98.5°F | Resp 18 | Wt 149.2 lb

## 2018-06-26 DIAGNOSIS — C530 Malignant neoplasm of endocervix: Secondary | ICD-10-CM

## 2018-06-26 DIAGNOSIS — D61818 Other pancytopenia: Secondary | ICD-10-CM

## 2018-06-26 MED ORDER — SODIUM CHLORIDE 0.9% FLUSH
10.0000 mL | INTRAVENOUS | Status: DC | PRN
  Administered 2018-06-26: 16:00:00 10 mL
  Filled 2018-06-26: qty 10

## 2018-06-26 MED ORDER — SODIUM CHLORIDE 0.9 % IV SOLN
Freq: Once | INTRAVENOUS | Status: AC
  Administered 2018-06-26: 12:00:00 via INTRAVENOUS
  Filled 2018-06-26: qty 5

## 2018-06-26 MED ORDER — HEPARIN SOD (PORK) LOCK FLUSH 100 UNIT/ML IV SOLN
500.0000 [IU] | Freq: Once | INTRAVENOUS | Status: AC | PRN
  Administered 2018-06-26: 16:00:00 500 [IU]
  Filled 2018-06-26: qty 5

## 2018-06-26 MED ORDER — PALONOSETRON HCL INJECTION 0.25 MG/5ML
0.2500 mg | Freq: Once | INTRAVENOUS | Status: AC
  Administered 2018-06-26: 12:00:00 0.25 mg via INTRAVENOUS

## 2018-06-26 MED ORDER — POTASSIUM CHLORIDE 2 MEQ/ML IV SOLN
Freq: Once | INTRAVENOUS | Status: AC
  Administered 2018-06-26: 10:00:00 via INTRAVENOUS
  Filled 2018-06-26: qty 10

## 2018-06-26 MED ORDER — PALONOSETRON HCL INJECTION 0.25 MG/5ML
INTRAVENOUS | Status: AC
  Filled 2018-06-26: qty 5

## 2018-06-26 MED ORDER — SODIUM CHLORIDE 0.9 % IV SOLN
Freq: Once | INTRAVENOUS | Status: AC
  Administered 2018-06-26: 09:00:00 via INTRAVENOUS
  Filled 2018-06-26: qty 250

## 2018-06-26 MED ORDER — SODIUM CHLORIDE 0.9 % IV SOLN
20.0000 mg/m2 | Freq: Once | INTRAVENOUS | Status: AC
  Administered 2018-06-26: 34 mg via INTRAVENOUS
  Filled 2018-06-26: qty 34

## 2018-06-26 NOTE — Patient Instructions (Signed)
[facility] Discharge Instructions for Patients Receiving Chemotherapy  Today you received the following chemotherapy agents: Cisplatin  To help prevent nausea and vomiting after your treatment, we encourage you to take your nausea medication  as prescribed.    If you develop nausea and vomiting that is not controlled by your nausea medication, call the clinic.   BELOW ARE SYMPTOMS THAT SHOULD BE REPORTED IMMEDIATELY:  *FEVER GREATER THAN 100.5 F  *CHILLS WITH OR WITHOUT FEVER  NAUSEA AND VOMITING THAT IS NOT CONTROLLED WITH YOUR NAUSEA MEDICATION  *UNUSUAL SHORTNESS OF BREATH  *UNUSUAL BRUISING OR BLEEDING  TENDERNESS IN MOUTH AND THROAT WITH OR WITHOUT PRESENCE OF ULCERS  *URINARY PROBLEMS  *BOWEL PROBLEMS  UNUSUAL RASH Items with * indicate a potential emergency and should be followed up as soon as possible.  Feel free to call the clinic should you have any questions or concerns. The clinic phone number is [phone].  Please show the CHEMO ALERT CARD at check-in to the Emergency Department and triage nurse.   

## 2018-06-26 NOTE — Progress Notes (Signed)
The patient is approved for drug assistance by [employer] for Granix. Enrollment is effective until 06/25/19 and is based on self pay.  Drug replacement will begin on DOS 06/22/18.

## 2018-06-28 ENCOUNTER — Other Ambulatory Visit (HOSPITAL_COMMUNITY): Payer: Self-pay | Admitting: Radiation Oncology

## 2018-06-28 DIAGNOSIS — C530 Malignant neoplasm of endocervix: Secondary | ICD-10-CM

## 2018-06-29 ENCOUNTER — Telehealth: Payer: Self-pay | Admitting: Oncology

## 2018-06-29 ENCOUNTER — Inpatient Hospital Stay (HOSPITAL_BASED_OUTPATIENT_CLINIC_OR_DEPARTMENT_OTHER): Payer: Self-pay | Admitting: Hematology and Oncology

## 2018-06-29 ENCOUNTER — Inpatient Hospital Stay: Payer: Self-pay

## 2018-06-29 ENCOUNTER — Encounter: Payer: Self-pay | Admitting: Radiation Oncology

## 2018-06-29 ENCOUNTER — Other Ambulatory Visit: Payer: Self-pay

## 2018-06-29 ENCOUNTER — Other Ambulatory Visit (HOSPITAL_COMMUNITY): Admission: RE | Admit: 2018-06-29 | Payer: Self-pay | Source: Ambulatory Visit

## 2018-06-29 VITALS — BP 118/76 | HR 71 | Temp 98.7°F | Resp 18 | Ht 62.0 in | Wt 149.4 lb

## 2018-06-29 DIAGNOSIS — R110 Nausea: Secondary | ICD-10-CM

## 2018-06-29 DIAGNOSIS — C530 Malignant neoplasm of endocervix: Secondary | ICD-10-CM

## 2018-06-29 DIAGNOSIS — D61818 Other pancytopenia: Secondary | ICD-10-CM

## 2018-06-29 DIAGNOSIS — T451X5A Adverse effect of antineoplastic and immunosuppressive drugs, initial encounter: Secondary | ICD-10-CM

## 2018-06-29 LAB — COMPREHENSIVE METABOLIC PANEL
ALT: 14 U/L (ref 0–44)
AST: 14 U/L — ABNORMAL LOW (ref 15–41)
Albumin: 3.5 g/dL (ref 3.5–5.0)
Alkaline Phosphatase: 67 U/L (ref 38–126)
Anion gap: 7 (ref 5–15)
BUN: 11 mg/dL (ref 6–20)
CO2: 26 mmol/L (ref 22–32)
Calcium: 8.9 mg/dL (ref 8.9–10.3)
Chloride: 104 mmol/L (ref 98–111)
Creatinine, Ser: 0.79 mg/dL (ref 0.44–1.00)
GFR calc Af Amer: >60 mL/min (ref >60)
GFR calc non Af Amer: >60 mL/min (ref >60)
Glucose, Bld: 92 mg/dL (ref 70–99)
Potassium: 4.5 mmol/L (ref 3.5–5.1)
Sodium: 137 mmol/L (ref 135–145)
Total Bilirubin: 0.3 mg/dL (ref 0.3–1.2)
Total Protein: 6.2 g/dL — ABNORMAL LOW (ref 6.5–8.1)

## 2018-06-29 LAB — CBC WITH DIFFERENTIAL/PLATELET
Abs Immature Granulocytes: 0.07 10*3/uL (ref 0.00–0.07)
Basophils Absolute: 0 10*3/uL (ref 0.0–0.1)
Basophils Relative: 1 %
Eosinophils Absolute: 0 10*3/uL (ref 0.0–0.5)
Eosinophils Relative: 1 %
HCT: 27.2 % — ABNORMAL LOW (ref 36.0–46.0)
Hemoglobin: 8.7 g/dL — ABNORMAL LOW (ref 12.0–15.0)
Immature Granulocytes: 4 %
Lymphocytes Relative: 20 %
Lymphs Abs: 0.4 10*3/uL — ABNORMAL LOW (ref 0.7–4.0)
MCH: 31.5 pg (ref 26.0–34.0)
MCHC: 32 g/dL (ref 30.0–36.0)
MCV: 98.6 fL (ref 80.0–100.0)
Monocytes Absolute: 0.4 10*3/uL (ref 0.1–1.0)
Monocytes Relative: 20 %
Neutro Abs: 1.1 10*3/uL — ABNORMAL LOW (ref 1.7–7.7)
Neutrophils Relative %: 54 %
Platelets: 278 10*3/uL (ref 150–400)
RBC: 2.76 MIL/uL — ABNORMAL LOW (ref 3.87–5.11)
RDW: 17.2 % — ABNORMAL HIGH (ref 11.5–15.5)
WBC: 2 10*3/uL — ABNORMAL LOW (ref 4.0–10.5)
nRBC: 0 % (ref 0.0–0.2)

## 2018-06-29 LAB — SAMPLE TO BLOOD BANK

## 2018-06-29 LAB — MAGNESIUM: Magnesium: 1.8 mg/dL (ref 1.7–2.4)

## 2018-06-29 MED ORDER — TBO-FILGRASTIM 300 MCG/0.5ML ~~LOC~~ SOSY
300.0000 ug | PREFILLED_SYRINGE | Freq: Once | SUBCUTANEOUS | Status: AC
  Administered 2018-06-29: 09:00:00 300 ug via SUBCUTANEOUS

## 2018-06-29 MED ORDER — TBO-FILGRASTIM 300 MCG/0.5ML ~~LOC~~ SOSY
PREFILLED_SYRINGE | SUBCUTANEOUS | Status: AC
  Filled 2018-06-29: qty 0.5

## 2018-06-29 NOTE — Telephone Encounter (Signed)
Called Sheena Torres and discussed the tandem and ring procedure and her schedule.  She missed her lab appointment this morning for Covid testing.  [name] with pre-op and rescheduled the appointment to tomorrow, 06/30/18 at 9:25.  Called Curtis back and advised her of appointment time and that she needs to go to the [facility]. She verbalized understanding and agreement.

## 2018-06-30 ENCOUNTER — Other Ambulatory Visit (HOSPITAL_COMMUNITY)
Admission: RE | Admit: 2018-06-30 | Discharge: 2018-06-30 | Disposition: A | Discharge status: Home / Self Care | Payer: HRSA Program | Source: Ambulatory Visit | Attending: Radiation Oncology | Admitting: Radiation Oncology

## 2018-06-30 ENCOUNTER — Encounter: Payer: Self-pay | Admitting: Hematology and Oncology

## 2018-06-30 DIAGNOSIS — Z1159 Encounter for screening for other viral diseases: Secondary | ICD-10-CM | POA: Diagnosis present

## 2018-06-30 NOTE — Assessment & Plan Note (Signed)
She is not symptomatic Due to risk of neutropenic fever, she will receive G-CSF support as needed to keep [place] greater than 1.5 She will receive 1 dose of G-CSF She does not need blood transfusion

## 2018-06-30 NOTE — H&P (View-Only) (Signed)
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 female who is presenting to the office today for evaluation of cervical cancer.  Per Dr. Gorsuch, the patient's symptoms began in August 2019 with post-coital bleeding. Postmenopausal bleeding began in November 2019. She was seen by Dr. 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 She met with Dr. Rossi 04/22/18 who wrote that the patient reported a longstanding history of abnormal Pap smears since her 20s.  She had an abnormal Pap smear again during pregnancy in 2000 that was treated with biopsies but no other intervention.  6 years ago in 2014 she had an abnormal Pap smear with biopsies that she states everything turned out okay.  She states that she had had normal Pap smears since that time.  Her most recent Pap smear prior to diagnosis was in February 2018 which she says was normal. She consulted with Dr. Gorsuch on 05/01/18 after learning post-PET diagnosis that she doesn't qualify for surgical intervention. She recommended port placement and a 6 month work-leave at this time.   She was taken to the OR on 04/16/18 for a cervical cone biopsy. The specimen revealed a grossly normal cervix on the ectocervix.Cone specimen revealed squamous cell carcinoma. When discussing this with the pathologist he reported that in the actual cone specimen itself that was CIN 3 however there was a separate fragment of tissue that included carcinoma. The dimensions of this was 0.6 cm, however this involved the margins. A post cone ECC was positive for squamous cell carcinoma, as well as an endometrial curettage which also contain benign endometrial glands.  She underwent a PET scan on 04/24/18 which  revealed a large mass involving the cervix and endometrium which has a maximum dimension of 5.3 cm within SUV max of 20.4. Small bilateral pelvic sidewall lymph nodes measuring less than 1 cm with mild nonspecific uptake. The no hypermetabolic adenopathy or evidence of distant metastatic disease. Nonspecific pulmonary nodules measuring less than 5 mm are identified in the right lung. Too small to characterize by PET-CT.  A pelvic MRI with and without contrast followed on 04/27/18 which showed the cervical 5.5 x 3.4 x 3.2 cm mass compatible with primary cervical malignancy, with mild parametrial invasion. Stage IIB by MRI. Small volume simple free fluid in the pelvic cul-de-sac. No pathologically enlarged pelvic lymph nodes were noted.   she reports associated cramping pain in the left lower pelvis, keeping her awake often. She has consistent vaginal bleeding and is wearing pads for this issue she denies any other symptoms.   Patient has recently completed her external beam radiation therapy and radiosensitizing chemotherapy.  She now presents for her first brachii therapy procedure and high-dose-rate treatment   PREVIOUS RADIATION THERAPY: No  PAST MEDICAL HISTORY:  has a past medical history of ADHD, Cervical cancer (HCC) (oncologist-- dr gorsuch/  dr Briaunna Grindstaff), Chemotherapy-induced nausea, Fatigue, Hypomagnesemia, IBS (irritable bowel syndrome), Migraine, Pancytopenia, acquired (HCC), and Sensation of pressure in bladder area.    PAST SURGICAL HISTORY: Past Surgical History:  Procedure Laterality Date  . BREAST ENHANCEMENT SURGERY Bilateral 2017   w/ implants  . CESAREAN SECTION  2000  . IR IMAGING GUIDED PORT INSERTION  05/08/2018      FAMILY HISTORY: family history includes Cancer in her maternal grandmother; Diabetes in her father; Heart disease in her father and mother.  SOCIAL HISTORY:  reports that she has never smoked. She has never used smokeless tobacco. She reports previous alcohol  use. She reports that she does not use drugs.  ALLERGIES: Patient has no known allergies.  MEDICATIONS:  No current facility-administered medications for this encounter.    Current Outpatient Medications  Medication Sig Dispense Refill  . acetaminophen (TYLENOL) 500 MG tablet Take 1,000 mg by mouth 3 (three) times daily.     . lidocaine-prilocaine (EMLA) cream Apply to affected area once (Patient taking differently: Apply 1 application topically daily as needed (port access). ) 30 g 3  . loperamide (IMODIUM) 2 MG capsule Take 2 mg by mouth daily.    . magnesium oxide (MAG-OX) 400 (241.3 Mg) MG tablet Take 1 tablet (400 mg total) by mouth 2 (two) times daily. 30 tablet 1  . morphine (MSIR) 15 MG tablet Take 1 tablet (15 mg total) by mouth every 6 (six) hours as needed for severe pain. 30 tablet 0  . ondansetron (ZOFRAN) 8 MG tablet Take 1 tablet (8 mg total) by mouth every 8 (eight) hours as needed. Start on the third day after chemotherapy. 30 tablet 1  . Probiotic Product (ALIGN) 4 MG CAPS Take 4 mg by mouth daily.    . prochlorperazine (COMPAZINE) 10 MG tablet Take 1 tablet (10 mg total) by mouth every 6 (six) hours as needed (Nausea or vomiting). 30 tablet 1  . SUMAtriptan (IMITREX) 100 MG tablet Take 1 tablet (100 mg total) by mouth once for 1 dose. May repeat in 2 hours if no relief. (Patient taking differently: Take 100 mg by mouth every 2 (two) hours as needed for migraine. May repeat in 2 hours if no relief.) 10 tablet 11  . vitamin B-12 (CYANOCOBALAMIN) 500 MCG tablet Take 1,000 mcg by mouth daily.      REVIEW OF SYSTEMS:  A 10+ POINT REVIEW OF SYSTEMS WAS OBTAINED including neurology, dermatology, psychiatry, cardiac, respiratory, lymph, extremities, GI, GU, musculoskeletal, constitutional, reproductive, HEENT. All pertinent positives are noted in the HPI. All others are negative.    PHYSICAL EXAM:  height is 5' 2" (1.575 m) and weight is 147 lb (66.7 kg).   General: Alert and  oriented, in no acute distress HEENT: Head is normocephalic. Extraocular movements are intact. Oropharynx is clear. Neck: Neck is supple, no palpable cervical or supraclavicular lymphadenopathy. Heart: Regular in rate and rhythm with no murmurs, rubs, or gallops. Chest: Clear to auscultation bilaterally, with no rhonchi, wheezes, or rales. Abdomen: Soft, nontender, nondistended, with no rigidity or guarding. Extremities: No cyanosis or edema. Lymphatics: see Neck Exam Skin: No concerning lesions. Musculoskeletal: symmetric strength and muscle tone throughout. Neurologic: Cranial nerves II through XII are grossly intact. No obvious focalities. Speech is fluent. Coordination is intact. Psychiatric: Judgment and insight are intact. Affect is appropriate. Pelvic exam deferred until operative procedure  (exam  by Dr. Rossi 04/22/2018) Cervix: Cone bed friable.  Grossly normal residual ectocervix.  On palpation the cervix measures at least 4 cm and is barreled at the endocervix.  Within the base of the cone bed in there is visible white tumor predominantly in the anterior endocervix.  It external dimension is at least 1 cm.  There is no palpable parametrial infiltration.               Uterus:  Small, mobile, no parametrial     ECOG = 1    LABORATORY DATA:  Lab Results  Component Value Date   WBC 2.0 (L) 06/29/2018   HGB 8.7 (L) 06/29/2018   HCT 27.2 (L) 06/29/2018   MCV 98.6 06/29/2018   PLT 278 06/29/2018   NEUTROABS 1.1 (L) 06/29/2018   Lab Results  Component Value Date   NA 137 06/29/2018   K 4.5 06/29/2018   CL 104 06/29/2018   CO2 26 06/29/2018   GLUCOSE 92 06/29/2018   CREATININE 0.79 06/29/2018   CALCIUM 8.9 06/29/2018      RADIOGRAPHY: No results found.    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 She therefore would not  be a candidate for definitive surgery (radical hysterectomy). She would be a good candidate for a definitive course of radiation therapy, along with radiosensitizing chemotherapy. Radiation treatments would include 6 weeks of external beam, followed by 5 intracavitary high-dose rate brachytherapy treatments.  She has completed the external beam portion of her treatment as well as radiosensitizing chemotherapy.  She is now ready to proceed with brachii therapy treatments.    PLAN: Patient will be taken to the operating room on the May 21 for exam under anesthesia and placement of tandem/ring in preparation for high-dose-rate treatment with iridium 192 as the high-dose-rate source.  She is scheduled to receive 5 high-dose-rate treatments to complete her definitive course of therapy.    ------------------------------------------------  Chyler Creely D. Vedanshi Massaro, PhD, MD      

## 2018-06-30 NOTE — Progress Notes (Signed)
[place] OFFICE PROGRESS NOTE  Patient Care Team: [name] as PCP - General (Physician Assistant)  ASSESSMENT & PLAN:  Malignant neoplasm of endocervix [facility]) She has completed all her chemotherapy treatment We will continue to monitor her blood count weekly and provide supportive care Once she has completed her radiation treatment, she will get port flushes and PET CT scan within 3 months  Pancytopenia, acquired ([place]) She is not symptomatic Due to risk of neutropenic fever, she will receive G-CSF support as needed to keep [place] greater than 1.5 She will receive 1 dose of G-CSF She does not need blood transfusion  Chemotherapy-induced nausea Her nausea is manageable She will continue antiemetics as needed   No orders of the defined types were placed in this encounter.   INTERVAL HISTORY: Please see below for problem oriented charting. She returns for further follow-up She tolerated last cycle of chemotherapy well Denies recent fever or chills Nausea is well controlled No peripheral neuropathy She has some mild fatigue but overall not symptomatic SUMMARY OF ONCOLOGIC HISTORY:   Malignant neoplasm of endocervix ([place])   09/15/2017 Initial Diagnosis    The patient reports postcoital bleeding since August, 2019 and postmenopausal bleeding since November, 2019. 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     04/16/2018 Surgery    She was taken to the OR on 04/16/18 for a cervical cone biopsy. The specimen revealed a grossly normal cervix on the ectocervix.  Cone specimen revealed squamous cell carcinoma.  When discussing this with the pathologist he reported that in the actual cone specimen itself that was CIN 3 however there was a separate fragment of tissue that included carcinoma.  The dimensions of this was 0.6 cm, however this involved the margins.  A post cone ECC was  positive for squamous cell carcinoma, as well as an endometrial curettage which also contain benign endometrial glands.     04/16/2018 Pathology Results    Endocervix curettage: [facility]    04/22/2018 Initial Diagnosis    Malignant neoplasm of endocervix ([place])    04/24/2018 PET scan    1. There is a large mass involving the cervix and endometrium which has a maximum dimension of 5.3 cm within SUV max of 20.4. 2. Small bilateral pelvic sidewall lymph nodes measuring less than 1cm with mild nonspecific uptake. The no hypermetabolic adenopathy or evidence of distant metastatic disease. 3. Nonspecific pulmonary nodules measuring less than 5 mm are identified in the right lung. Too small to characterize by PET-CT.     04/27/2018 Imaging    MRI pelvis  1. Uterine cervix 5.5 x 3.4 x 3.2 cm mass compatible with primary cervical malignancy, with mild parametrial invasion. Stage IIB by MRI. 2. Small volume simple free fluid in the pelvic cul-de-sac. 3. No pathologically enlarged pelvic lymph nodes, see comments.    05/08/2018 Procedure    Placement of a subcutaneous port device. Catheter tip at the superior cavoatrial junction.    05/15/2018 - 06/26/2018 Chemotherapy    The patient had weekly cisplatin     REVIEW OF SYSTEMS:   Constitutional: Denies fevers, chills or abnormal weight loss Eyes: Denies blurriness of vision Ears, nose, mouth, throat, and face: Denies mucositis or sore throat Respiratory: Denies cough, dyspnea or wheezes Cardiovascular: Denies palpitation, chest discomfort or lower extremity swelling Gastrointestinal:  Denies nausea, heartburn or change in bowel habits Skin: Denies  abnormal skin rashes Lymphatics: Denies new lymphadenopathy or easy bruising Neurological:Denies numbness, tingling or new weaknesses Behavioral/Psych: Mood is stable, no new changes  All other systems were reviewed with the patient and are negative.  I have reviewed the past medical history, past surgical  history, social history and family history with the patient and they are unchanged from previous note.  ALLERGIES:  has No Known Allergies.  MEDICATIONS:  Current Outpatient Medications  Medication Sig Dispense Refill  . acetaminophen (TYLENOL) 500 MG tablet Take 1,000 mg by mouth 3 (three) times daily.     Marland Kitchen lidocaine-prilocaine (EMLA) cream Apply to affected area once (Patient taking differently: Apply 1 application topically daily as needed (port access). ) 30 g 3  . loperamide (IMODIUM) 2 MG capsule Take 2 mg by mouth daily.    . magnesium oxide (MAG-OX) 400 (241.3 Mg) MG tablet Take 1 tablet (400 mg total) by mouth 2 (two) times daily. 30 tablet 1  . morphine (MSIR) 15 MG tablet Take 1 tablet (15 mg total) by mouth every 6 (six) hours as needed for severe pain. 30 tablet 0  . ondansetron (ZOFRAN) 8 MG tablet Take 1 tablet (8 mg total) by mouth every 8 (eight) hours as needed. Start on the third day after chemotherapy. 30 tablet 1  . Probiotic Product (ALIGN) 4 MG CAPS Take 4 mg by mouth daily.    . prochlorperazine (COMPAZINE) 10 MG tablet Take 1 tablet (10 mg total) by mouth every 6 (six) hours as needed (Nausea or vomiting). 30 tablet 1  . SUMAtriptan (IMITREX) 100 MG tablet Take 1 tablet (100 mg total) by mouth once for 1 dose. May repeat in 2 hours if no relief. (Patient taking differently: Take 100 mg by mouth every 2 (two) hours as needed for migraine. May repeat in 2 hours if no relief.) 10 tablet 11  . vitamin B-12 (CYANOCOBALAMIN) 500 MCG tablet Take 1,000 mcg by mouth daily.     No current facility-administered medications for this visit.     PHYSICAL EXAMINATION: ECOG PERFORMANCE STATUS: 1 - Symptomatic but completely ambulatory  Vitals:   06/29/18 0828  BP: 118/76  Pulse: 71  Resp: 18  Temp: 98.7 F (37.1 C)  SpO2: 100%   Filed Weights   06/29/18 0828  Weight: 149 lb 6.4 oz (67.8 kg)    GENERAL:alert, no distress and comfortable Musculoskeletal:no cyanosis of  digits and no clubbing  NEURO: alert & oriented x 3 with fluent speech, no focal motor/sensory deficits  LABORATORY DATA:  I have reviewed the data as listed    Component Value Date/Time   NA 137 06/29/2018 0813   K 4.5 06/29/2018 0813   CL 104 06/29/2018 0813   CO2 26 06/29/2018 0813   GLUCOSE 92 06/29/2018 0813   BUN 11 06/29/2018 0813   CREATININE 0.79 06/29/2018 0813   CALCIUM 8.9 06/29/2018 0813   PROT 6.2 (L) 06/29/2018 0813   ALBUMIN 3.5 06/29/2018 0813   AST 14 (L) 06/29/2018 0813   ALT 14 06/29/2018 0813   ALKPHOS 67 06/29/2018 0813   BILITOT 0.3 06/29/2018 0813   GFRNONAA >60 06/29/2018 0813   GFRAA >60 06/29/2018 0813    No results found for: SPEP, UPEP  Lab Results  Component Value Date   WBC 2.0 (L) 06/29/2018   NEUTROABS 1.1 (L) 06/29/2018   HGB 8.7 (L) 06/29/2018   HCT 27.2 (L) 06/29/2018   MCV 98.6 06/29/2018   PLT 278 06/29/2018  Chemistry      Component Value Date/Time   NA 137 06/29/2018 0813   K 4.5 06/29/2018 0813   CL 104 06/29/2018 0813   CO2 26 06/29/2018 0813   BUN 11 06/29/2018 0813   CREATININE 0.79 06/29/2018 0813      Component Value Date/Time   CALCIUM 8.9 06/29/2018 0813   ALKPHOS 67 06/29/2018 0813   AST 14 (L) 06/29/2018 0813   ALT 14 06/29/2018 0813   BILITOT 0.3 06/29/2018 0813      All questions were answered. The patient knows to call the clinic with any problems, questions or concerns. No barriers to learning was detected.  I spent 15 minutes counseling the patient face to face. The total time spent in the appointment was 20 minutes and more than 50% was on counseling and review of test results  Heath Lark, MD 06/30/2018 10:25 AM

## 2018-06-30 NOTE — H&P (View-Only) (Signed)
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 female who is presenting to the office today for evaluation of cervical cancer.  Per Dr. Gorsuch, the patient's symptoms began in August 2019 with post-coital bleeding. Postmenopausal bleeding began in November 2019. She was seen by Dr. 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 She met with Dr. Rossi 04/22/18 who wrote that the patient reported a longstanding history of abnormal Pap smears since her 20s.  She had an abnormal Pap smear again during pregnancy in 2000 that was treated with biopsies but no other intervention.  6 years ago in 2014 she had an abnormal Pap smear with biopsies that she states everything turned out okay.  She states that she had had normal Pap smears since that time.  Her most recent Pap smear prior to diagnosis was in February 2018 which she says was normal. She consulted with Dr. Gorsuch on 05/01/18 after learning post-PET diagnosis that she doesn't qualify for surgical intervention. She recommended port placement and a 6 month work-leave at this time.   She was taken to the OR on 04/16/18 for a cervical cone biopsy. The specimen revealed a grossly normal cervix on the ectocervix.Cone specimen revealed squamous cell carcinoma. When discussing this with the pathologist he reported that in the actual cone specimen itself that was CIN 3 however there was a separate fragment of tissue that included carcinoma. The dimensions of this was 0.6 cm, however this involved the margins. A post cone ECC was positive for squamous cell carcinoma, as well as an endometrial curettage which also contain benign endometrial glands.  She underwent a PET scan on 04/24/18 which  revealed a large mass involving the cervix and endometrium which has a maximum dimension of 5.3 cm within SUV max of 20.4. Small bilateral pelvic sidewall lymph nodes measuring less than 1 cm with mild nonspecific uptake. The no hypermetabolic adenopathy or evidence of distant metastatic disease. Nonspecific pulmonary nodules measuring less than 5 mm are identified in the right lung. Too small to characterize by PET-CT.  A pelvic MRI with and without contrast followed on 04/27/18 which showed the cervical 5.5 x 3.4 x 3.2 cm mass compatible with primary cervical malignancy, with mild parametrial invasion. Stage IIB by MRI. Small volume simple free fluid in the pelvic cul-de-sac. No pathologically enlarged pelvic lymph nodes were noted.   she reports associated cramping pain in the left lower pelvis, keeping her awake often. She has consistent vaginal bleeding and is wearing pads for this issue she denies any other symptoms.   Patient has recently completed her external beam radiation therapy and radiosensitizing chemotherapy.  She now presents for her first brachii therapy procedure and high-dose-rate treatment   PREVIOUS RADIATION THERAPY: No  PAST MEDICAL HISTORY:  has a past medical history of ADHD, Cervical cancer (HCC) (oncologist-- dr gorsuch/  dr Samie Reasons), Chemotherapy-induced nausea, Fatigue, Hypomagnesemia, IBS (irritable bowel syndrome), Migraine, Pancytopenia, acquired ([facility]), and Sensation of pressure in bladder area.    PAST SURGICAL HISTORY: Past Surgical History:  Procedure Laterality Date  . BREAST ENHANCEMENT SURGERY Bilateral 2017   w/ implants  . CESAREAN SECTION  2000  . IR IMAGING GUIDED PORT INSERTION  05/08/2018      FAMILY HISTORY: family history includes Cancer in her maternal grandmother; Diabetes in her father; Heart disease in her father and mother.  SOCIAL HISTORY:  reports that she has never smoked. She has never used smokeless tobacco. She reports previous alcohol  use. She reports that she does not use drugs.  ALLERGIES: Patient has no known allergies.  MEDICATIONS:  No current facility-administered medications for this encounter.    Current Outpatient Medications  Medication Sig Dispense Refill  . acetaminophen (TYLENOL) 500 MG tablet Take 1,000 mg by mouth 3 (three) times daily.     . lidocaine-prilocaine (EMLA) cream Apply to affected area once (Patient taking differently: Apply 1 application topically daily as needed (port access). ) 30 g 3  . loperamide (IMODIUM) 2 MG capsule Take 2 mg by mouth daily.    . magnesium oxide (MAG-OX) 400 (241.3 Mg) MG tablet Take 1 tablet (400 mg total) by mouth 2 (two) times daily. 30 tablet 1  . morphine (MSIR) 15 MG tablet Take 1 tablet (15 mg total) by mouth every 6 (six) hours as needed for severe pain. 30 tablet 0  . ondansetron (ZOFRAN) 8 MG tablet Take 1 tablet (8 mg total) by mouth every 8 (eight) hours as needed. Start on the third day after chemotherapy. 30 tablet 1  . Probiotic Product (ALIGN) 4 MG CAPS Take 4 mg by mouth daily.    . prochlorperazine (COMPAZINE) 10 MG tablet Take 1 tablet (10 mg total) by mouth every 6 (six) hours as needed (Nausea or vomiting). 30 tablet 1  . SUMAtriptan (IMITREX) 100 MG tablet Take 1 tablet (100 mg total) by mouth once for 1 dose. May repeat in 2 hours if no relief. (Patient taking differently: Take 100 mg by mouth every 2 (two) hours as needed for migraine. May repeat in 2 hours if no relief.) 10 tablet 11  . vitamin B-12 (CYANOCOBALAMIN) 500 MCG tablet Take 1,000 mcg by mouth daily.      REVIEW OF SYSTEMS:  A 10+ POINT REVIEW OF SYSTEMS WAS OBTAINED including neurology, dermatology, psychiatry, cardiac, respiratory, lymph, extremities, GI, GU, musculoskeletal, constitutional, reproductive, HEENT. All pertinent positives are noted in the HPI. All others are negative.    PHYSICAL EXAM:  height is 5' 2" (1.575 m) and weight is 147 lb (66.7 kg).   General: Alert and  oriented, in no acute distress HEENT: Head is normocephalic. Extraocular movements are intact. Oropharynx is clear. Neck: Neck is supple, no palpable cervical or supraclavicular lymphadenopathy. Heart: Regular in rate and rhythm with no murmurs, rubs, or gallops. Chest: Clear to auscultation bilaterally, with no rhonchi, wheezes, or rales. Abdomen: Soft, nontender, nondistended, with no rigidity or guarding. Extremities: No cyanosis or edema. Lymphatics: see Neck Exam Skin: No concerning lesions. Musculoskeletal: symmetric strength and muscle tone throughout. Neurologic: Cranial nerves II through XII are grossly intact. No obvious focalities. Speech is fluent. Coordination is intact. Psychiatric: Judgment and insight are intact. Affect is appropriate. Pelvic exam deferred until operative procedure  (exam  by Dr. Rossi 04/22/2018) Cervix: Cone bed friable.  Grossly normal residual ectocervix.  On palpation the cervix measures at least 4 cm and is barreled at the endocervix.  Within the base of the cone bed in there is visible white tumor predominantly in the anterior endocervix.  It external dimension is at least 1 cm.  There is no palpable parametrial infiltration.               Uterus:  Small, mobile, no parametrial     ECOG = 1    LABORATORY DATA:  Lab Results  Component Value Date   WBC 2.0 (L) 06/29/2018   HGB 8.7 (L) 06/29/2018   HCT 27.2 (L) 06/29/2018   MCV 98.6 06/29/2018   PLT 278 06/29/2018   NEUTROABS 1.1 (L) 06/29/2018   Lab Results  Component Value Date   NA 137 06/29/2018   K 4.5 06/29/2018   CL 104 06/29/2018   CO2 26 06/29/2018   GLUCOSE 92 06/29/2018   CREATININE 0.79 06/29/2018   CALCIUM 8.9 06/29/2018      RADIOGRAPHY: No results found.    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 She therefore would not  be a candidate for definitive surgery (radical hysterectomy). She would be a good candidate for a definitive course of radiation therapy, along with radiosensitizing chemotherapy. Radiation treatments would include 6 weeks of external beam, followed by 5 intracavitary high-dose rate brachytherapy treatments.  She has completed the external beam portion of her treatment as well as radiosensitizing chemotherapy.  She is now ready to proceed with brachii therapy treatments.    PLAN: Patient will be taken to the operating room on the May 21 for exam under anesthesia and placement of tandem/ring in preparation for high-dose-rate treatment with iridium 192 as the high-dose-rate source.  She is scheduled to receive 5 high-dose-rate treatments to complete her definitive course of therapy.    ------------------------------------------------  James D. Kinard, PhD, MD      

## 2018-06-30 NOTE — H&P (Signed)
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 female who is presenting to the office today for evaluation of cervical cancer.  Per Dr. Gorsuch, the patient's symptoms began in August 2019 with post-coital bleeding. Postmenopausal bleeding began in November 2019. She was seen by Dr. 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 She met with Dr. Rossi 04/22/18 who wrote that the patient reported a longstanding history of abnormal Pap smears since her 20s.  She had an abnormal Pap smear again during pregnancy in 2000 that was treated with biopsies but no other intervention.  6 years ago in 2014 she had an abnormal Pap smear with biopsies that she states everything turned out okay.  She states that she had had normal Pap smears since that time.  Her most recent Pap smear prior to diagnosis was in February 2018 which she says was normal. She consulted with Dr. Gorsuch on 05/01/18 after learning post-PET diagnosis that she doesn't qualify for surgical intervention. She recommended port placement and a 6 month work-leave at this time.   She was taken to the OR on 04/16/18 for a cervical cone biopsy. The specimen revealed a grossly normal cervix on the ectocervix.Cone specimen revealed squamous cell carcinoma. When discussing this with the pathologist he reported that in the actual cone specimen itself that was CIN 3 however there was a separate fragment of tissue that included carcinoma. The dimensions of this was 0.6 cm, however this involved the margins. A post cone ECC was positive for squamous cell carcinoma, as well as an endometrial curettage which also contain benign endometrial glands.  She underwent a PET scan on 04/24/18 which  revealed a large mass involving the cervix and endometrium which has a maximum dimension of 5.3 cm within SUV max of 20.4. Small bilateral pelvic sidewall lymph nodes measuring less than 1 cm with mild nonspecific uptake. The no hypermetabolic adenopathy or evidence of distant metastatic disease. Nonspecific pulmonary nodules measuring less than 5 mm are identified in the right lung. Too small to characterize by PET-CT.  A pelvic MRI with and without contrast followed on 04/27/18 which showed the cervical 5.5 x 3.4 x 3.2 cm mass compatible with primary cervical malignancy, with mild parametrial invasion. Stage IIB by MRI. Small volume simple free fluid in the pelvic cul-de-sac. No pathologically enlarged pelvic lymph nodes were noted.   she reports associated cramping pain in the left lower pelvis, keeping her awake often. She has consistent vaginal bleeding and is wearing pads for this issue she denies any other symptoms.   Patient has recently completed her external beam radiation therapy and radiosensitizing chemotherapy.  She now presents for her first brachii therapy procedure and high-dose-rate treatment   PREVIOUS RADIATION THERAPY: No  PAST MEDICAL HISTORY:  has a past medical history of ADHD, Cervical cancer (HCC) (oncologist-- dr gorsuch/  dr Doretta Remmert), Chemotherapy-induced nausea, Fatigue, Hypomagnesemia, IBS (irritable bowel syndrome), Migraine, Pancytopenia, acquired (HCC), and Sensation of pressure in bladder area.    PAST SURGICAL HISTORY: Past Surgical History:  Procedure Laterality Date  . BREAST ENHANCEMENT SURGERY Bilateral 2017   w/ implants  . CESAREAN SECTION  2000  . IR IMAGING GUIDED PORT INSERTION  05/08/2018      FAMILY HISTORY: family history includes Cancer in her maternal grandmother; Diabetes in her father; Heart disease in her father and mother.  SOCIAL HISTORY:  reports that she has never smoked. She has never used smokeless tobacco. She reports previous alcohol  use. She reports that she does not use drugs.  ALLERGIES: Patient has no known allergies.  MEDICATIONS:  No current facility-administered medications for this encounter.    Current Outpatient Medications  Medication Sig Dispense Refill  . acetaminophen (TYLENOL) 500 MG tablet Take 1,000 mg by mouth 3 (three) times daily.     . lidocaine-prilocaine (EMLA) cream Apply to affected area once (Patient taking differently: Apply 1 application topically daily as needed (port access). ) 30 g 3  . loperamide (IMODIUM) 2 MG capsule Take 2 mg by mouth daily.    . magnesium oxide (MAG-OX) 400 (241.3 Mg) MG tablet Take 1 tablet (400 mg total) by mouth 2 (two) times daily. 30 tablet 1  . morphine (MSIR) 15 MG tablet Take 1 tablet (15 mg total) by mouth every 6 (six) hours as needed for severe pain. 30 tablet 0  . ondansetron (ZOFRAN) 8 MG tablet Take 1 tablet (8 mg total) by mouth every 8 (eight) hours as needed. Start on the third day after chemotherapy. 30 tablet 1  . Probiotic Product (ALIGN) 4 MG CAPS Take 4 mg by mouth daily.    . prochlorperazine (COMPAZINE) 10 MG tablet Take 1 tablet (10 mg total) by mouth every 6 (six) hours as needed (Nausea or vomiting). 30 tablet 1  . SUMAtriptan (IMITREX) 100 MG tablet Take 1 tablet (100 mg total) by mouth once for 1 dose. May repeat in 2 hours if no relief. (Patient taking differently: Take 100 mg by mouth every 2 (two) hours as needed for migraine. May repeat in 2 hours if no relief.) 10 tablet 11  . vitamin B-12 (CYANOCOBALAMIN) 500 MCG tablet Take 1,000 mcg by mouth daily.      REVIEW OF SYSTEMS:  A 10+ POINT REVIEW OF SYSTEMS WAS OBTAINED including neurology, dermatology, psychiatry, cardiac, respiratory, lymph, extremities, GI, GU, musculoskeletal, constitutional, reproductive, HEENT. All pertinent positives are noted in the HPI. All others are negative.    PHYSICAL EXAM:  height is 5' 2" (1.575 m) and weight is 147 lb (66.7 kg).   General: Alert and  oriented, in no acute distress HEENT: Head is normocephalic. Extraocular movements are intact. Oropharynx is clear. Neck: Neck is supple, no palpable cervical or supraclavicular lymphadenopathy. Heart: Regular in rate and rhythm with no murmurs, rubs, or gallops. Chest: Clear to auscultation bilaterally, with no rhonchi, wheezes, or rales. Abdomen: Soft, nontender, nondistended, with no rigidity or guarding. Extremities: No cyanosis or edema. Lymphatics: see Neck Exam Skin: No concerning lesions. Musculoskeletal: symmetric strength and muscle tone throughout. Neurologic: Cranial nerves II through XII are grossly intact. No obvious focalities. Speech is fluent. Coordination is intact. Psychiatric: Judgment and insight are intact. Affect is appropriate. Pelvic exam deferred until operative procedure  (exam  by Dr. Rossi 04/22/2018) Cervix: Cone bed friable.  Grossly normal residual ectocervix.  On palpation the cervix measures at least 4 cm and is barreled at the endocervix.  Within the base of the cone bed in there is visible white tumor predominantly in the anterior endocervix.  It external dimension is at least 1 cm.  There is no palpable parametrial infiltration.               Uterus:  Small, mobile, no parametrial     ECOG = 1    LABORATORY DATA:  Lab Results  Component Value Date   WBC 2.0 (L) 06/29/2018   HGB 8.7 (L) 06/29/2018   HCT 27.2 (L) 06/29/2018   MCV 98.6 06/29/2018   PLT 278 06/29/2018   NEUTROABS 1.1 (L) 06/29/2018   Lab Results  Component Value Date   NA 137 06/29/2018   K 4.5 06/29/2018   CL 104 06/29/2018   CO2 26 06/29/2018   GLUCOSE 92 06/29/2018   CREATININE 0.79 06/29/2018   CALCIUM 8.9 06/29/2018      RADIOGRAPHY: No results found.    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 She therefore would not  be a candidate for definitive surgery (radical hysterectomy). She would be a good candidate for a definitive course of radiation therapy, along with radiosensitizing chemotherapy. Radiation treatments would include 6 weeks of external beam, followed by 5 intracavitary high-dose rate brachytherapy treatments.  She has completed the external beam portion of her treatment as well as radiosensitizing chemotherapy.  She is now ready to proceed with brachii therapy treatments.    PLAN: Patient will be taken to the operating room on the May 21 for exam under anesthesia and placement of tandem/ring in preparation for high-dose-rate treatment with iridium 192 as the high-dose-rate source.  She is scheduled to receive 5 high-dose-rate treatments to complete her definitive course of therapy.    ------------------------------------------------  Caytlin Better D. Rodgers Likes, PhD, MD      

## 2018-06-30 NOTE — Assessment & Plan Note (Signed)
She has completed all her chemotherapy treatment We will continue to monitor her blood count weekly and provide supportive care Once she has completed her radiation treatment, she will get port flushes and PET CT scan within 3 months

## 2018-06-30 NOTE — H&P (View-Only) (Signed)
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 female who is presenting to the office today for evaluation of cervical cancer.  Per Dr. Gorsuch, the patient's symptoms began in August 2019 with post-coital bleeding. Postmenopausal bleeding began in November 2019. She was seen by Dr. 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 She met with Dr. Rossi 04/22/18 who wrote that the patient reported a longstanding history of abnormal Pap smears since her 20s.  She had an abnormal Pap smear again during pregnancy in 2000 that was treated with biopsies but no other intervention.  6 years ago in 2014 she had an abnormal Pap smear with biopsies that she states everything turned out okay.  She states that she had had normal Pap smears since that time.  Her most recent Pap smear prior to diagnosis was in February 2018 which she says was normal. She consulted with Dr. Gorsuch on 05/01/18 after learning post-PET diagnosis that she doesn't qualify for surgical intervention. She recommended port placement and a 6 month work-leave at this time.   She was taken to the OR on 04/16/18 for a cervical cone biopsy. The specimen revealed a grossly normal cervix on the ectocervix.Cone specimen revealed squamous cell carcinoma. When discussing this with the pathologist he reported that in the actual cone specimen itself that was CIN 3 however there was a separate fragment of tissue that included carcinoma. The dimensions of this was 0.6 cm, however this involved the margins. A post cone ECC was positive for squamous cell carcinoma, as well as an endometrial curettage which also contain benign endometrial glands.  She underwent a PET scan on 04/24/18 which  revealed a large mass involving the cervix and endometrium which has a maximum dimension of 5.3 cm within SUV max of 20.4. Small bilateral pelvic sidewall lymph nodes measuring less than 1 cm with mild nonspecific uptake. The no hypermetabolic adenopathy or evidence of distant metastatic disease. Nonspecific pulmonary nodules measuring less than 5 mm are identified in the right lung. Too small to characterize by PET-CT.  A pelvic MRI with and without contrast followed on 04/27/18 which showed the cervical 5.5 x 3.4 x 3.2 cm mass compatible with primary cervical malignancy, with mild parametrial invasion. Stage IIB by MRI. Small volume simple free fluid in the pelvic cul-de-sac. No pathologically enlarged pelvic lymph nodes were noted.   she reports associated cramping pain in the left lower pelvis, keeping her awake often. She has consistent vaginal bleeding and is wearing pads for this issue she denies any other symptoms.   Patient has recently completed her external beam radiation therapy and radiosensitizing chemotherapy.  She now presents for her first brachii therapy procedure and high-dose-rate treatment   PREVIOUS RADIATION THERAPY: No  PAST MEDICAL HISTORY:  has a past medical history of ADHD, Cervical cancer (HCC) (oncologist-- dr gorsuch/  dr Yaretsi Humphres), Chemotherapy-induced nausea, Fatigue, Hypomagnesemia, IBS (irritable bowel syndrome), Migraine, Pancytopenia, acquired (HCC), and Sensation of pressure in bladder area.    PAST SURGICAL HISTORY: Past Surgical History:  Procedure Laterality Date  . BREAST ENHANCEMENT SURGERY Bilateral 2017   w/ implants  . CESAREAN SECTION  2000  . IR IMAGING GUIDED PORT INSERTION  05/08/2018      FAMILY HISTORY: family history includes Cancer in her maternal grandmother; Diabetes in her father; Heart disease in her father and mother.  SOCIAL HISTORY:  reports that she has never smoked. She has never used smokeless tobacco. She reports previous alcohol  use. She reports that she does not use drugs.  ALLERGIES: Patient has no known allergies.  MEDICATIONS:  No current facility-administered medications for this encounter.    Current Outpatient Medications  Medication Sig Dispense Refill  . acetaminophen (TYLENOL) 500 MG tablet Take 1,000 mg by mouth 3 (three) times daily.     . lidocaine-prilocaine (EMLA) cream Apply to affected area once (Patient taking differently: Apply 1 application topically daily as needed (port access). ) 30 g 3  . loperamide (IMODIUM) 2 MG capsule Take 2 mg by mouth daily.    . magnesium oxide (MAG-OX) 400 (241.3 Mg) MG tablet Take 1 tablet (400 mg total) by mouth 2 (two) times daily. 30 tablet 1  . morphine (MSIR) 15 MG tablet Take 1 tablet (15 mg total) by mouth every 6 (six) hours as needed for severe pain. 30 tablet 0  . ondansetron (ZOFRAN) 8 MG tablet Take 1 tablet (8 mg total) by mouth every 8 (eight) hours as needed. Start on the third day after chemotherapy. 30 tablet 1  . Probiotic Product (ALIGN) 4 MG CAPS Take 4 mg by mouth daily.    . prochlorperazine (COMPAZINE) 10 MG tablet Take 1 tablet (10 mg total) by mouth every 6 (six) hours as needed (Nausea or vomiting). 30 tablet 1  . SUMAtriptan (IMITREX) 100 MG tablet Take 1 tablet (100 mg total) by mouth once for 1 dose. May repeat in 2 hours if no relief. (Patient taking differently: Take 100 mg by mouth every 2 (two) hours as needed for migraine. May repeat in 2 hours if no relief.) 10 tablet 11  . vitamin B-12 (CYANOCOBALAMIN) 500 MCG tablet Take 1,000 mcg by mouth daily.      REVIEW OF SYSTEMS:  A 10+ POINT REVIEW OF SYSTEMS WAS OBTAINED including neurology, dermatology, psychiatry, cardiac, respiratory, lymph, extremities, GI, GU, musculoskeletal, constitutional, reproductive, HEENT. All pertinent positives are noted in the HPI. All others are negative.    PHYSICAL EXAM:  height is 5' 2" (1.575 m) and weight is 147 lb (66.7 kg).   General: Alert and  oriented, in no acute distress HEENT: Head is normocephalic. Extraocular movements are intact. Oropharynx is clear. Neck: Neck is supple, no palpable cervical or supraclavicular lymphadenopathy. Heart: Regular in rate and rhythm with no murmurs, rubs, or gallops. Chest: Clear to auscultation bilaterally, with no rhonchi, wheezes, or rales. Abdomen: Soft, nontender, nondistended, with no rigidity or guarding. Extremities: No cyanosis or edema. Lymphatics: see Neck Exam Skin: No concerning lesions. Musculoskeletal: symmetric strength and muscle tone throughout. Neurologic: Cranial nerves II through XII are grossly intact. No obvious focalities. Speech is fluent. Coordination is intact. Psychiatric: Judgment and insight are intact. Affect is appropriate. Pelvic exam deferred until operative procedure  (exam  by Dr. Rossi 04/22/2018) Cervix: Cone bed friable.  Grossly normal residual ectocervix.  On palpation the cervix measures at least 4 cm and is barreled at the endocervix.  Within the base of the cone bed in there is visible white tumor predominantly in the anterior endocervix.  It external dimension is at least 1 cm.  There is no palpable parametrial infiltration.               Uterus:  Small, mobile, no parametrial     ECOG = 1    LABORATORY DATA:  Lab Results  Component Value Date   WBC 2.0 (L) 06/29/2018   HGB 8.7 (L) 06/29/2018   HCT 27.2 (L) 06/29/2018   MCV 98.6 06/29/2018   PLT 278 06/29/2018   NEUTROABS 1.1 (L) 06/29/2018   Lab Results  Component Value Date   NA 137 06/29/2018   K 4.5 06/29/2018   CL 104 06/29/2018   CO2 26 06/29/2018   GLUCOSE 92 06/29/2018   CREATININE 0.79 06/29/2018   CALCIUM 8.9 06/29/2018      RADIOGRAPHY: No results found.    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 She therefore would not  be a candidate for definitive surgery (radical hysterectomy). She would be a good candidate for a definitive course of radiation therapy, along with radiosensitizing chemotherapy. Radiation treatments would include 6 weeks of external beam, followed by 5 intracavitary high-dose rate brachytherapy treatments.  She has completed the external beam portion of her treatment as well as radiosensitizing chemotherapy.  She is now ready to proceed with brachii therapy treatments.    PLAN: Patient will be taken to the operating room on the May 21 for exam under anesthesia and placement of tandem/ring in preparation for high-dose-rate treatment with iridium 192 as the high-dose-rate source.  She is scheduled to receive 5 high-dose-rate treatments to complete her definitive course of therapy.    ------------------------------------------------  Annis Lagoy D. Aviendha Azbell, PhD, MD      

## 2018-06-30 NOTE — Assessment & Plan Note (Signed)
Her nausea is manageable She will continue antiemetics as needed

## 2018-06-30 NOTE — H&P (View-Only) (Signed)
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 female who is presenting to the office today for evaluation of cervical cancer.  Per Dr. Alvy Bimler, the patient's symptoms began in August 2019 with post-coital bleeding. Postmenopausal bleeding began in November 2019. She was seen by Dr. 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 She met with Dr. Denman George 04/22/18 who wrote that the patient reported a longstanding history of abnormal Pap smears since her 72s.  She had an abnormal Pap smear again during pregnancy in 2000 that was treated with biopsies but no other intervention.  6 years ago in 2014 she had an abnormal Pap smear with biopsies that she states everything turned out okay.  She states that she had had normal Pap smears since that time.  Her most recent Pap smear prior to diagnosis was in February 2018 which she says was normal. She consulted with Dr. Alvy Bimler on 05/01/18 after learning post-PET diagnosis that she doesn't qualify for surgical intervention. She recommended port placement and a 6 month work-leave at this time.   She was taken to the OR on 04/16/18 for a cervical cone biopsy. The specimen revealed a grossly normal cervix on the ectocervix.Cone specimen revealed squamous cell carcinoma. When discussing this with the pathologist he reported that in the actual cone specimen itself that was CIN 3 however there was a separate fragment of tissue that included carcinoma. The dimensions of this was 0.6 cm, however this involved the margins. A post cone ECC was positive for squamous cell carcinoma, as well as an endometrial curettage which also contain benign endometrial glands.  She underwent a PET scan on 04/24/18 which  revealed a large mass involving the cervix and endometrium which has a maximum dimension of 5.3 cm within SUV max of 20.4. Small bilateral pelvic sidewall lymph nodes measuring less than 1 cm with mild nonspecific uptake. The no hypermetabolic adenopathy or evidence of distant metastatic disease. Nonspecific pulmonary nodules measuring less than 5 mm are identified in the right lung. Too small to characterize by PET-CT.  A pelvic MRI with and without contrast followed on 04/27/18 which showed the cervical 5.5 x 3.4 x 3.2 cm mass compatible with primary cervical malignancy, with mild parametrial invasion. Stage IIB by MRI. Small volume simple free fluid in the pelvic cul-de-sac. No pathologically enlarged pelvic lymph nodes were noted.   she reports associated cramping pain in the left lower pelvis, keeping her awake often. She has consistent vaginal bleeding and is wearing pads for this issue she denies any other symptoms.   Patient has recently completed her external beam radiation therapy and radiosensitizing chemotherapy.  She now presents for her first brachii therapy procedure and high-dose-rate treatment   PREVIOUS RADIATION THERAPY: No  PAST MEDICAL HISTORY:  has a past medical history of ADHD, Cervical cancer ([place]) (oncologist-- dr gorsuch/  dr Sondra Come), Chemotherapy-induced nausea, Fatigue, Hypomagnesemia, IBS (irritable bowel syndrome), Migraine, Pancytopenia, acquired ([place]), and Sensation of pressure in bladder area.    PAST SURGICAL HISTORY: Past Surgical History:  Procedure Laterality Date  . BREAST ENHANCEMENT SURGERY Bilateral 2017   w/ implants  . CESAREAN SECTION  2000  . IR IMAGING GUIDED PORT INSERTION  05/08/2018  FAMILY HISTORY: family history includes Cancer in her maternal grandmother; Diabetes in her father; Heart disease in her father and mother.  SOCIAL HISTORY:  reports that she has never smoked. She has never used smokeless tobacco. She reports previous alcohol  use. She reports that she does not use drugs.  ALLERGIES: Patient has no known allergies.  MEDICATIONS:  No current facility-administered medications for this encounter.    Current Outpatient Medications  Medication Sig Dispense Refill  . acetaminophen (TYLENOL) 500 MG tablet Take 1,000 mg by mouth 3 (three) times daily.     Marland Kitchen lidocaine-prilocaine (EMLA) cream Apply to affected area once (Patient taking differently: Apply 1 application topically daily as needed (port access). ) 30 g 3  . loperamide (IMODIUM) 2 MG capsule Take 2 mg by mouth daily.    . magnesium oxide (MAG-OX) 400 (241.3 Mg) MG tablet Take 1 tablet (400 mg total) by mouth 2 (two) times daily. 30 tablet 1  . morphine (MSIR) 15 MG tablet Take 1 tablet (15 mg total) by mouth every 6 (six) hours as needed for severe pain. 30 tablet 0  . ondansetron (ZOFRAN) 8 MG tablet Take 1 tablet (8 mg total) by mouth every 8 (eight) hours as needed. Start on the third day after chemotherapy. 30 tablet 1  . Probiotic Product (ALIGN) 4 MG CAPS Take 4 mg by mouth daily.    . prochlorperazine (COMPAZINE) 10 MG tablet Take 1 tablet (10 mg total) by mouth every 6 (six) hours as needed (Nausea or vomiting). 30 tablet 1  . SUMAtriptan (IMITREX) 100 MG tablet Take 1 tablet (100 mg total) by mouth once for 1 dose. May repeat in 2 hours if no relief. (Patient taking differently: Take 100 mg by mouth every 2 (two) hours as needed for migraine. May repeat in 2 hours if no relief.) 10 tablet 11  . vitamin B-12 (CYANOCOBALAMIN) 500 MCG tablet Take 1,000 mcg by mouth daily.      REVIEW OF SYSTEMS:  A 10+ POINT REVIEW OF SYSTEMS WAS OBTAINED including neurology, dermatology, psychiatry, cardiac, respiratory, lymph, extremities, GI, GU, musculoskeletal, constitutional, reproductive, HEENT. All pertinent positives are noted in the HPI. All others are negative.    PHYSICAL EXAM:  height is _0  (1.575 m) and weight is 147 lb (66.7 kg).   General: Alert and  oriented, in no acute distress HEENT: Head is normocephalic. Extraocular movements are intact. Oropharynx is clear. Neck: Neck is supple, no palpable cervical or supraclavicular lymphadenopathy. Heart: Regular in rate and rhythm with no murmurs, rubs, or gallops. Chest: Clear to auscultation bilaterally, with no rhonchi, wheezes, or rales. Abdomen: Soft, nontender, nondistended, with no rigidity or guarding. Extremities: No cyanosis or edema. Lymphatics: see Neck Exam Skin: No concerning lesions. Musculoskeletal: symmetric strength and muscle tone throughout. Neurologic: Cranial nerves II through XII are grossly intact. No obvious focalities. Speech is fluent. Coordination is intact. Psychiatric: Judgment and insight are intact. Affect is appropriate. Pelvic exam deferred until operative procedure  (exam  by Dr. Denman George 04/22/2018) Cervix: Cone bed friable.  Grossly normal residual ectocervix.  On palpation the cervix measures at least 4 cm and is barreled at the endocervix.  Within the base of the cone bed in there is visible white tumor predominantly in the anterior endocervix.  It external dimension is at least 1 cm.  There is no palpable parametrial infiltration.               Uterus:  Small, mobile, no parametrial  ECOG = 1    LABORATORY DATA:  Lab Results  Component Value Date   WBC 2.0 (L) 06/29/2018   HGB 8.7 (L) 06/29/2018   HCT 27.2 (L) 06/29/2018   MCV 98.6 06/29/2018   PLT 278 06/29/2018   NEUTROABS 1.1 (L) 06/29/2018   Lab Results  Component Value Date   NA 137 06/29/2018   K 4.5 06/29/2018   CL 104 06/29/2018   CO2 26 06/29/2018   GLUCOSE 92 06/29/2018   CREATININE 0.79 06/29/2018   CALCIUM 8.9 06/29/2018      RADIOGRAPHY: No results found.    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 She therefore would not  be a candidate for definitive surgery (radical hysterectomy). She would be a good candidate for a definitive course of radiation therapy, along with radiosensitizing chemotherapy. Radiation treatments would include 6 weeks of external beam, followed by 5 intracavitary high-dose rate brachytherapy treatments.  She has completed the external beam portion of her treatment as well as radiosensitizing chemotherapy.  She is now ready to proceed with brachii therapy treatments.    PLAN: Patient will be taken to the operating room on the May 21 for exam under anesthesia and placement of tandem/ring in preparation for high-dose-rate treatment with iridium 192 as the high-dose-rate source.  She is scheduled to receive 5 high-dose-rate treatments to complete her definitive course of therapy.    ------------------------------------------------  Blair Promise, PhD, MD

## 2018-07-01 ENCOUNTER — Telehealth: Payer: Self-pay | Admitting: Hematology and Oncology

## 2018-07-01 LAB — NOVEL CORONAVIRUS, NAA (HOSP ORDER, SEND-OUT TO REF LAB; TAT 18-24 HRS): SARS-CoV-2, NAA: NOT DETECTED

## 2018-07-01 NOTE — Progress Notes (Signed)
SPOKE W/  _susan Tolley     SCREENING SYMPTOMS OF COVID 19:   COUGH--no  RUNNY NOSE---no   SORE THROAT---no  NASAL CONGESTION----no  SNEEZING----no  SHORTNESS OF BREATH---no  DIFFICULTY BREATHING---  TEMP >100.0 ----no-  UNEXPLAINED BODY ACHES------no  CHILLS ------no--   HEADACHES ---------no  LOSS OF SMELL/ TASTE --------no    HAVE YOU OR ANY FAMILY MEMBER TRAVELLED PAST 14 DAYS OUT OF THE   COUNTY---no STATE----no COUNTRY---no-  HAVE YOU OR ANY FAMILY MEMBER BEEN EXPOSED TO ANYONE WITH COVID 19? no

## 2018-07-01 NOTE — Telephone Encounter (Signed)
Tried to reach regarding schedule °

## 2018-07-02 ENCOUNTER — Ambulatory Visit (HOSPITAL_BASED_OUTPATIENT_CLINIC_OR_DEPARTMENT_OTHER): Payer: Self-pay | Admitting: Anesthesiology

## 2018-07-02 ENCOUNTER — Other Ambulatory Visit: Payer: Self-pay

## 2018-07-02 ENCOUNTER — Ambulatory Visit (HOSPITAL_BASED_OUTPATIENT_CLINIC_OR_DEPARTMENT_OTHER)
Admission: RE | Admit: 2018-07-02 | Discharge: 2018-07-02 | Disposition: A | Discharge status: Other Acute Inpatient Hospital | Payer: Self-pay | Attending: Radiation Oncology | Admitting: Radiation Oncology

## 2018-07-02 ENCOUNTER — Ambulatory Visit
Admission: RE | Admit: 2018-07-02 | Discharge: 2018-07-02 | Disposition: A | Discharge status: Home / Self Care | Payer: Self-pay | Source: Ambulatory Visit | Attending: Radiation Oncology | Admitting: Radiation Oncology

## 2018-07-02 ENCOUNTER — Ambulatory Visit (HOSPITAL_COMMUNITY)
Admission: RE | Admit: 2018-07-02 | Discharge: 2018-07-02 | Disposition: A | Discharge status: Home / Self Care | Payer: Self-pay | Source: Ambulatory Visit | Attending: Radiation Oncology | Admitting: Radiation Oncology

## 2018-07-02 ENCOUNTER — Encounter (HOSPITAL_BASED_OUTPATIENT_CLINIC_OR_DEPARTMENT_OTHER)
Admission: RE | Disposition: A | Discharge status: Other Acute Inpatient Hospital | Payer: Self-pay | Source: Home / Self Care | Attending: Radiation Oncology

## 2018-07-02 ENCOUNTER — Encounter (HOSPITAL_BASED_OUTPATIENT_CLINIC_OR_DEPARTMENT_OTHER): Payer: Self-pay

## 2018-07-02 DIAGNOSIS — C530 Malignant neoplasm of endocervix: Secondary | ICD-10-CM

## 2018-07-02 DIAGNOSIS — K589 Irritable bowel syndrome without diarrhea: Secondary | ICD-10-CM | POA: Insufficient documentation

## 2018-07-02 DIAGNOSIS — C539 Malignant neoplasm of cervix uteri, unspecified: Secondary | ICD-10-CM

## 2018-07-02 DIAGNOSIS — Z79899 Other long term (current) drug therapy: Secondary | ICD-10-CM | POA: Insufficient documentation

## 2018-07-02 HISTORY — DX: Nausea: R11.0

## 2018-07-02 HISTORY — DX: Attention-deficit hyperactivity disorder, unspecified type: F90.9

## 2018-07-02 HISTORY — DX: Other symptoms and signs involving the genitourinary system: R39.89

## 2018-07-02 HISTORY — DX: Other pancytopenia: D61.818

## 2018-07-02 HISTORY — PX: OPERATIVE ULTRASOUND: SHX5996

## 2018-07-02 HISTORY — DX: Irritable bowel syndrome, unspecified: K58.9

## 2018-07-02 HISTORY — DX: Malignant neoplasm of cervix uteri, unspecified: C53.9

## 2018-07-02 HISTORY — PX: TANDEM RING INSERTION: SHX6199

## 2018-07-02 HISTORY — DX: Other fatigue: R53.83

## 2018-07-02 SURGERY — INSERTION, UTERINE TANDEM AND RING OR CYLINDER, FOR BRACHYTHERAPY
Anesthesia: General | Site: Cervix

## 2018-07-02 MED ORDER — FENTANYL CITRATE (PF) 100 MCG/2ML IJ SOLN
INTRAMUSCULAR | Status: DC | PRN
  Administered 2018-07-02 (×2): 50 ug via INTRAVENOUS

## 2018-07-02 MED ORDER — ONDANSETRON HCL 4 MG/2ML IJ SOLN
INTRAMUSCULAR | Status: AC
  Filled 2018-07-02: qty 2

## 2018-07-02 MED ORDER — MIDAZOLAM HCL 2 MG/2ML IJ SOLN
INTRAMUSCULAR | Status: AC
  Filled 2018-07-02: qty 2

## 2018-07-02 MED ORDER — LIDOCAINE 2% (20 MG/ML) 5 ML SYRINGE
INTRAMUSCULAR | Status: AC
  Filled 2018-07-02: qty 5

## 2018-07-02 MED ORDER — LACTATED RINGERS IV SOLN
INTRAVENOUS | Status: DC
  Administered 2018-07-02: 06:00:00 50 mL/h via INTRAVENOUS
  Filled 2018-07-02: qty 1000

## 2018-07-02 MED ORDER — SCOPOLAMINE 1 MG/3DAYS TD PT72
1.0000 | MEDICATED_PATCH | TRANSDERMAL | Status: DC
  Administered 2018-07-02: 07:00:00 1.5 mg via TRANSDERMAL
  Filled 2018-07-02: qty 1

## 2018-07-02 MED ORDER — FENTANYL CITRATE (PF) 100 MCG/2ML IJ SOLN
INTRAMUSCULAR | Status: AC
  Filled 2018-07-02: qty 2

## 2018-07-02 MED ORDER — SODIUM CHLORIDE 0.9 % IR SOLN
Status: DC | PRN
  Administered 2018-07-02: 200 mL

## 2018-07-02 MED ORDER — LACTATED RINGERS IV SOLN
Freq: Once | INTRAVENOUS | Status: AC
  Administered 2018-07-02: 10:00:00 via INTRAVENOUS
  Filled 2018-07-02: qty 250

## 2018-07-02 MED ORDER — ARTIFICIAL TEARS OPHTHALMIC OINT
TOPICAL_OINTMENT | OPHTHALMIC | Status: AC
  Filled 2018-07-02: qty 3.5

## 2018-07-02 MED ORDER — KETOROLAC TROMETHAMINE 30 MG/ML IJ SOLN
INTRAMUSCULAR | Status: DC | PRN
  Administered 2018-07-02: 30 mg via INTRAVENOUS

## 2018-07-02 MED ORDER — PROPOFOL 10 MG/ML IV BOLUS
INTRAVENOUS | Status: DC | PRN
  Administered 2018-07-02: 150 mg via INTRAVENOUS

## 2018-07-02 MED ORDER — MIDAZOLAM HCL 2 MG/2ML IJ SOLN
INTRAMUSCULAR | Status: DC | PRN
  Administered 2018-07-02: 2 mg via INTRAVENOUS

## 2018-07-02 MED ORDER — PROMETHAZINE HCL 25 MG/ML IJ SOLN
6.2500 mg | INTRAMUSCULAR | Status: DC | PRN
  Filled 2018-07-02: qty 1

## 2018-07-02 MED ORDER — KETOROLAC TROMETHAMINE 30 MG/ML IJ SOLN
INTRAMUSCULAR | Status: AC
  Filled 2018-07-02: qty 1

## 2018-07-02 MED ORDER — HYDROMORPHONE HCL 1 MG/ML IJ SOLN
0.5000 mg | Freq: Once | INTRAMUSCULAR | Status: AC
  Administered 2018-07-02: 10:00:00 0.5 mg via INTRAVENOUS
  Filled 2018-07-02: qty 1

## 2018-07-02 MED ORDER — SCOPOLAMINE 1 MG/3DAYS TD PT72
MEDICATED_PATCH | TRANSDERMAL | Status: AC
  Filled 2018-07-02: qty 1

## 2018-07-02 MED ORDER — DEXAMETHASONE SODIUM PHOSPHATE 10 MG/ML IJ SOLN
INTRAMUSCULAR | Status: DC | PRN
  Administered 2018-07-02: 5 mg via INTRAVENOUS

## 2018-07-02 MED ORDER — DEXAMETHASONE SODIUM PHOSPHATE 10 MG/ML IJ SOLN
INTRAMUSCULAR | Status: AC
  Filled 2018-07-02: qty 1

## 2018-07-02 MED ORDER — FENTANYL CITRATE (PF) 100 MCG/2ML IJ SOLN
25.0000 ug | INTRAMUSCULAR | Status: DC | PRN
  Administered 2018-07-02: 09:00:00 25 ug via INTRAVENOUS
  Administered 2018-07-02: 09:00:00 50 ug via INTRAVENOUS
  Filled 2018-07-02: qty 1

## 2018-07-02 MED ORDER — ONDANSETRON HCL 4 MG/2ML IJ SOLN
INTRAMUSCULAR | Status: DC | PRN
  Administered 2018-07-02: 4 mg via INTRAVENOUS

## 2018-07-02 MED ORDER — ACETAMINOPHEN 500 MG PO TABS
ORAL_TABLET | ORAL | Status: AC
  Filled 2018-07-02: qty 2

## 2018-07-02 MED ORDER — PROPOFOL 10 MG/ML IV BOLUS
INTRAVENOUS | Status: AC
  Filled 2018-07-02: qty 20

## 2018-07-02 MED ORDER — HYDROMORPHONE HCL 1 MG/ML IJ SOLN
0.5000 mg | Freq: Once | INTRAMUSCULAR | Status: AC
  Administered 2018-07-02: 13:00:00 0.5 mg via INTRAVENOUS
  Filled 2018-07-02: qty 1

## 2018-07-02 MED ORDER — ACETAMINOPHEN 500 MG PO TABS
1000.0000 mg | ORAL_TABLET | Freq: Once | ORAL | Status: AC
  Administered 2018-07-02: 1000 mg via ORAL
  Filled 2018-07-02: qty 2

## 2018-07-02 MED ORDER — LIDOCAINE 2% (20 MG/ML) 5 ML SYRINGE
INTRAMUSCULAR | Status: DC | PRN
  Administered 2018-07-02: 80 mg via INTRAVENOUS

## 2018-07-02 SURGICAL SUPPLY — 24 items
BNDG CONFORM 2 STRL LF (GAUZE/BANDAGES/DRESSINGS) IMPLANT
COVER WAND RF STERILE (DRAPES) ×4 IMPLANT
DILATOR CANAL MILEX (MISCELLANEOUS) IMPLANT
GAUZE 4X4 16PLY RFD (DISPOSABLE) ×4 IMPLANT
GLOVE BIO SURGEON STRL SZ7.5 (GLOVE) ×8 IMPLANT
GLOVE BIOGEL PI IND STRL 8.5 (GLOVE) ×2 IMPLANT
GLOVE BIOGEL PI INDICATOR 8.5 (GLOVE) ×2
GLOVE INDICATOR 8.5 STRL (GLOVE) ×4 IMPLANT
GOWN STRL REUS W/ TWL XL LVL3 (GOWN DISPOSABLE) ×2 IMPLANT
GOWN STRL REUS W/TWL LRG LVL3 (GOWN DISPOSABLE) ×4 IMPLANT
GOWN STRL REUS W/TWL XL LVL3 (GOWN DISPOSABLE) ×2
HOLDER FOLEY CATH W/STRAP (MISCELLANEOUS) ×4 IMPLANT
HOVERMATT SINGLE USE (MISCELLANEOUS) ×4 IMPLANT
IV NS 1000ML (IV SOLUTION) ×2
IV NS 1000ML BAXH (IV SOLUTION) ×2 IMPLANT
IV SET EXTENSION GRAVITY 40 LF (IV SETS) ×4 IMPLANT
KIT TURNOVER CYSTO (KITS) ×4 IMPLANT
PACK VAGINAL MINOR WOMEN LF (CUSTOM PROCEDURE TRAY) ×4 IMPLANT
PACKING VAGINAL (PACKING) IMPLANT
PAD ABD 8X10 STRL (GAUZE/BANDAGES/DRESSINGS) ×4 IMPLANT
PAD OB MATERNITY 4.3X12.25 (PERSONAL CARE ITEMS) IMPLANT
TOWEL OR 17X26 10 PK STRL BLUE (TOWEL DISPOSABLE) ×4 IMPLANT
TRAY FOLEY W/BAG SLVR 14FR (SET/KITS/TRAYS/PACK) ×4 IMPLANT
WATER STERILE IRR 500ML POUR (IV SOLUTION) ×4 IMPLANT

## 2018-07-02 NOTE — Anesthesia Postprocedure Evaluation (Signed)
Anesthesia Post Note  Patient: CHAUNTE HORNBECK  Procedure(s) Performed: TANDEM RING INSERTION (N/A Cervix) OPERATIVE ULTRASOUND (N/A Abdomen)     Patient location during evaluation: PACU Anesthesia Type: General Level of consciousness: sedated Pain management: pain level controlled Vital Signs Assessment: post-procedure vital signs reviewed and stable Respiratory status: spontaneous breathing and respiratory function stable Cardiovascular status: stable Postop Assessment: no apparent nausea or vomiting Anesthetic complications: no    Last Vitals:  Vitals:   07/02/18 0836 07/02/18 0855  BP:    Pulse: 84 88  Resp: 12 16  Temp:  36.5 C  SpO2: 100% 99%    Last Pain:  Vitals:   07/02/18 0855  TempSrc:   PainSc: 4                  Alyssia Heese DANIEL

## 2018-07-02 NOTE — Anesthesia Procedure Notes (Signed)
Procedure Name: LMA Insertion Date/Time: 07/02/2018 7:31 AM Performed by: Wanita Chamberlain, CRNA Pre-anesthesia Checklist: Patient identified, Emergency Drugs available, Suction available and Patient being monitored Patient Re-evaluated:Patient Re-evaluated prior to induction Oxygen Delivery Method: Circle system utilized Preoxygenation: Pre-oxygenation with 100% oxygen Induction Type: IV induction Ventilation: Mask ventilation without difficulty LMA: LMA inserted LMA Size: 4.0 Number of attempts: 1 Airway Equipment and Method: Bite block Placement Confirmation: CO2 detector,  positive ETCO2 and breath sounds checked- equal and bilateral Tube secured with: Tape Dental Injury: Teeth and Oropharynx as per pre-operative assessment

## 2018-07-02 NOTE — Progress Notes (Signed)
  Radiation Oncology         [phone] ________________________________  Name: Sheena Torres MRN: [id number]  Date: 07/02/2018  DOB: 12-27-1965  CC: [name], MD  HDR BRACHYTHERAPY NOTE  DIAGNOSIS: 53 y.o. 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 Identity was confirmed. All relevant records and images related to the planned course of therapy were reviewed. The patient freely provided informed written consent to proceed with treatment after reviewing the details related to the planned course of therapy. The consent form was witnessed and verified by the simulation staff. Then, the patient was set-up in a stable reproducible supine position for radiation therapy. The tandem ring system was accessed and fiducial markers were placed within the tandem and ring.   Simple treatment device note: On the operating room the patient had construction of her custom tandem ring system. She will be treated with a 45 tandem/ring system. The patient had placement of a 60 mm tandem. A cervical ring with a small shielding was used for her treatment. A rectal paddle was also part of her custom set up device.  Verification simulation note: An AP and lateral film was obtained through the pelvis area. This was compared to the patient's planning films documenting accurate position of the tandem/ring system for treatment.  High-dose-rate brachytherapy treatment note:  The remote afterloading device was accessed through catheter system and attached to the tandem ring system. Patient then proceeded to undergo her first high-dose-rate treatment directed at the cervix. The patient was prescribed a dose of 5.5 gray to be delivered to the high-risk clinical target volume.. Patient was treated with 2 channels using 12+15=27 dwell positions. Treatment time was 513.8 seconds. The patient tolerated the procedure well. After  completion of her therapy, a radiation survey was performed documenting return of the iridium source into the [employer] safe. The patient was then transferred to the nursing suite. She then had removal of the rectal paddle followed by the tandem and ring system. The patient tolerated the removal well.  PLAN: The patient will return on 07/09/2018 for her second HDR treatment. ________________________________  Blair Promise, PhD, MD  This document serves as a record of services personally performed by Gery Pray, MD. It was created on his behalf by Rae Lips, a trained medical scribe. The creation of this record is based on the scribe's personal observations and the provider's statements to them. This document has been checked and approved by the attending provider.

## 2018-07-02 NOTE — Op Note (Signed)
07/02/2018  8:49 AM  PATIENT:  Sheena Torres  53 y.o. female  PRE-OPERATIVE DIAGNOSIS:  ENDO CERVIX  POST-OPERATIVE DIAGNOSIS:  ENDO CERVIX  PROCEDURE:  Procedure(s): TANDEM RING INSERTION (N/A) OPERATIVE ULTRASOUND (N/A)  SURGEON:  Surgeon(s) and Role:    * Gery Pray, MD - Primary  PHYSICIAN ASSISTANT:   ASSISTANTS: none   ANESTHESIA:   general  EBL: 3 ml   BLOOD ADMINISTERED:none  DRAINS: Urinary Catheter (Foley)   LOCAL MEDICATIONS USED:  NONE  SPECIMEN:  No Specimen  DISPOSITION OF SPECIMEN:  N/A  COUNTS:  YES  TOURNIQUET:  * No tourniquets in log *  DICTATION: Patient was taken to outpatient OR #1.  Timeout performed for the procedure, estimated length of the procedure, preoperative medications.  Patient was prepped and draped in the usual sterile fashion and placed in the dorsolithotomy position.  A Foley catheter was placed without difficulty.  The Foley catheter was backfilled with approximately 200 cc of sterile water for imaging purposes.  Exam under anesthesia revealed excellent response to her external beam radiation and radiosensitizing chemotherapy.  The cervical mass was estimated to be approximately 2 and half by 2-1/2 cm.  There is some erythema to the posterior lip of the cervix but no visible lesion.  On bimanual and rectovaginal examination there is no parametrial extension appreciated.  Patient proceeded to undergo sounding of the uterus.  The uterus was moderately anteverted and sounded to a length of 8.5 cm.  Excellent intraoperative ultrasound images were obtained.  Patient then proceeded to undergo serial dilation of the cervical os.  She then had placement of a 60 mm cervical sleeve within the endometrial cavity and endocervical canal.  Was then followed by placement of a 60 mm, 45 degree tandem within the cervical sleeve.  Intraoperative ultrasound showed good placement centrally within the uterus.  Patient then had placement of a 45 degree ring  with a small shielding In place.  This was then followed by placement of a rectal paddle posteriorly.  Final intraoperative imaging showed good placement of the tandem ring apparatus for the patient's first high-dose-rate radiation treatment.  Patient was subsequently transported to the recovery room in stable condition.  Later in plan the patient will be  transported to radiation oncology for planning and her first high-dose-rate treatment.  Patient is to receive 5.5 Gy to the high risk clinical target volume.  She will be treated with iridium 192 is the high-dose-rate source.  PLAN OF CARE: Transferred to radiation oncology for planning and treatment  PATIENT DISPOSITION:  PACU - hemodynamically stable.   Delay start of Pharmacological VTE agent (>24hrs) due to surgical blood loss or risk of bleeding: not applicable

## 2018-07-02 NOTE — Transfer of Care (Signed)
Immediate Anesthesia Transfer of Care Note  Patient: Sheena Torres  Procedure(s) Performed: TANDEM RING INSERTION (N/A Cervix) OPERATIVE ULTRASOUND (N/A Abdomen)  Patient Location: PACU  Anesthesia Type:General  Level of Consciousness: awake, alert , oriented and patient cooperative  Airway & Oxygen Therapy: Patient Spontanous Breathing and Patient connected to nasal cannula oxygen  Post-op Assessment: Report given to RN and Post -op Vital signs reviewed and stable  Post vital signs: Reviewed and stable  Last Vitals:  Vitals Value Taken Time  BP    Temp    Pulse 82 07/02/2018  8:12 AM  Resp 13 07/02/2018  8:12 AM  SpO2 100 % 07/02/2018  8:12 AM  Vitals shown include unvalidated device data.  Last Pain:  Vitals:   07/02/18 0600  TempSrc:   PainSc: 0-No pain      Patients Stated Pain Goal: 6 (35/[id number]/51 [id number])  Complications: No apparent anesthesia complications

## 2018-07-02 NOTE — Interval H&P Note (Signed)
History and Physical Interval Note:  07/02/2018 7:24 AM  Sheena Torres  has presented today for surgery, with the diagnosis of ENDO CERVIX.  The various methods of treatment have been discussed with the patient and family. After consideration of risks, benefits and other options for treatment, the patient has consented to  Procedure(s): TANDEM RING INSERTION (N/A) OPERATIVE ULTRASOUND (N/A) as a surgical intervention.  The patient's history has been reviewed, patient examined, no change in status, stable for surgery.  I have reviewed the patient's chart and labs.  Questions were answered to the patient's satisfaction.     Gery Pray

## 2018-07-02 NOTE — Progress Notes (Signed)
  Radiation Oncology         ([phone]) [phone] ________________________________  Name: Sheena Torres MRN: [id number]  Date: 07/02/2018  DOB: May 20, 1965  SIMULATION AND TREATMENT PLANNING NOTE HDR BRACHYTHERAPY  DIAGNOSIS:  54 y.o. 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  Identity was confirmed.  All relevant records and images related to the planned course of therapy were reviewed.  The patient freely provided informed written consent to proceed with treatment after reviewing the details related to the planned course of therapy. The consent form was witnessed and verified by the simulation staff.  Then, the patient was set-up in a stable reproducible  supine position for radiation therapy.  CT images were obtained.  Surface markings were placed.  The CT images were loaded into the planning software.  Then the target and avoidance structures were contoured.  Treatment planning then occurred.  The radiation prescription was entered and confirmed.   I have requested : Brachytherapy Isodose Plan and Dosimetry Calculations to plan the radiation distribution.    PLAN:  The patient will receive 5.5 Gy in 1 fraction to the high-risk clinical target volume.  Iridium 192 will be the high-dose-rate source.  Patient will be treated with the tandem/ring system.   ________________________________  Blair Promise, PhD, MD  This document serves as a record of services personally performed by Gery Pray, MD. It was created on his behalf by Rae Lips, a trained medical scribe. The creation of this record is based on the scribe's personal observations and the provider's statements to them. This document has been checked and approved by the attending provider.

## 2018-07-02 NOTE — Anesthesia Preprocedure Evaluation (Addendum)
Anesthesia Evaluation  Patient identified by MRN, date of birth, ID band Patient awake    Reviewed: Allergy & Precautions, NPO status , Patient's Chart, lab work & pertinent test results  History of Anesthesia Complications Negative for: history of anesthetic complications  Airway Mallampati: I  TM Distance: >3 FB Neck ROM: Full    Dental no notable dental hx. (+) Teeth Intact, Dental Advisory Given   Pulmonary neg pulmonary ROS,    Pulmonary exam normal        Cardiovascular negative cardio ROS Normal cardiovascular exam     Neuro/Psych negative neurological ROS     GI/Hepatic negative GI ROS, Neg liver ROS, IBS    Endo/Other  negative endocrine ROS  Renal/GU negative Renal ROS     Musculoskeletal negative musculoskeletal ROS (+)   Abdominal   Peds  Hematology negative hematology ROS (+)   Anesthesia Other Findings Day of surgery medications reviewed with the patient.  Reproductive/Obstetrics                           Anesthesia Physical Anesthesia Plan  ASA: III  Anesthesia Plan: General   Post-op Pain Management:    Induction: Intravenous  PONV Risk Score and Plan: 4 or greater and Ondansetron, Dexamethasone, Diphenhydramine and Scopolamine patch - Pre-op  Airway Management Planned: LMA  Additional Equipment:   Intra-op Plan:   Post-operative Plan: Extubation in OR  Informed Consent: I have reviewed the patients History and Physical, chart, labs and discussed the procedure including the risks, benefits and alternatives for the proposed anesthesia with the patient or authorized representative who has indicated his/her understanding and acceptance.     Dental advisory given  Plan Discussed with: CRNA and Anesthesiologist  Anesthesia Plan Comments:        Anesthesia Quick Evaluation

## 2018-07-03 ENCOUNTER — Ambulatory Visit: Payer: Self-pay | Admitting: Radiation Oncology

## 2018-07-03 ENCOUNTER — Other Ambulatory Visit: Payer: Self-pay

## 2018-07-03 ENCOUNTER — Encounter (HOSPITAL_BASED_OUTPATIENT_CLINIC_OR_DEPARTMENT_OTHER): Payer: Self-pay | Admitting: *Deleted

## 2018-07-03 NOTE — Progress Notes (Signed)
Spoke w/ pt via phone for pre-op interview.  Npo after mn.  Arrive at 0530.  COVID 19 done 06-30-2018, result negative.  Pt answered no to all covid screening question's and stated she has been quarantine'ed and verbalized understanding to continue quarantine until after all surgical procedures for tandem placement is completed.  Pre- op orders pending. Spoke w/ shirley , dr Sondra Come or scheduler, for pre-op orders.

## 2018-07-07 ENCOUNTER — Other Ambulatory Visit: Payer: Self-pay

## 2018-07-07 ENCOUNTER — Inpatient Hospital Stay: Payer: Self-pay

## 2018-07-07 ENCOUNTER — Encounter (HOSPITAL_BASED_OUTPATIENT_CLINIC_OR_DEPARTMENT_OTHER): Payer: Self-pay | Admitting: Radiation Oncology

## 2018-07-07 ENCOUNTER — Ambulatory Visit: Payer: Self-pay | Admitting: Radiation Oncology

## 2018-07-07 ENCOUNTER — Telehealth: Payer: Self-pay

## 2018-07-07 VITALS — BP 122/77 | HR 78 | Temp 98.0°F | Resp 16

## 2018-07-07 DIAGNOSIS — C530 Malignant neoplasm of endocervix: Secondary | ICD-10-CM

## 2018-07-07 LAB — CBC WITH DIFFERENTIAL/PLATELET
Abs Immature Granulocytes: 0.04 10*3/uL (ref 0.00–0.07)
Basophils Absolute: 0 10*3/uL (ref 0.0–0.1)
Basophils Relative: 1 %
Eosinophils Absolute: 0 10*3/uL (ref 0.0–0.5)
Eosinophils Relative: 1 %
HCT: 28.1 % — ABNORMAL LOW (ref 36.0–46.0)
Hemoglobin: 9 g/dL — ABNORMAL LOW (ref 12.0–15.0)
Immature Granulocytes: 2 %
Lymphocytes Relative: 18 %
Lymphs Abs: 0.5 10*3/uL — ABNORMAL LOW (ref 0.7–4.0)
MCH: 32.3 pg (ref 26.0–34.0)
MCHC: 32 g/dL (ref 30.0–36.0)
MCV: 100.7 fL — ABNORMAL HIGH (ref 80.0–100.0)
Monocytes Absolute: 0.4 10*3/uL (ref 0.1–1.0)
Monocytes Relative: 15 %
Neutro Abs: 1.7 10*3/uL (ref 1.7–7.7)
Neutrophils Relative %: 63 %
Platelets: 259 10*3/uL (ref 150–400)
RBC: 2.79 MIL/uL — ABNORMAL LOW (ref 3.87–5.11)
RDW: 18.6 % — ABNORMAL HIGH (ref 11.5–15.5)
WBC: 2.6 10*3/uL — ABNORMAL LOW (ref 4.0–10.5)
nRBC: 0 % (ref 0.0–0.2)

## 2018-07-07 MED ORDER — TBO-FILGRASTIM 300 MCG/0.5ML ~~LOC~~ SOSY: 300.0000 ug | PREFILLED_SYRINGE | Freq: Once | SUBCUTANEOUS | Status: DC

## 2018-07-07 NOTE — Progress Notes (Signed)
Per parameters granix injection not given today. Pt given copy of lab results.

## 2018-07-07 NOTE — Patient Instructions (Signed)
Tbo-Filgrastim injection What is this medicine? TBO-FILGRASTIM (T B O fil GRA stim) is a granulocyte colony-stimulating factor that stimulates the growth of neutrophils, a type of white blood cell important in the body's fight against infection. It is used to reduce the incidence of fever and infection in patients with certain types of cancer who are receiving chemotherapy that affects the bone marrow. This medicine may be used for other purposes; ask your health care provider or pharmacist if you have questions. COMMON BRAND NAME(S): Granix What should I tell my health care provider before I take this medicine? They need to know if you have any of these conditions: -bone scan or tests planned -kidney disease -sickle cell anemia -an unusual or allergic reaction to tbo-filgrastim, filgrastim, pegfilgrastim, other medicines, foods, dyes, or preservatives -pregnant or trying to get pregnant -breast-feeding How should I use this medicine? This medicine is for injection under the skin. If you get this medicine at home, you will be taught how to prepare and give this medicine. Refer to the Instructions for Use that come with your medication packaging. Use exactly as directed. Take your medicine at regular intervals. Do not take your medicine more often than directed. It is important that you put your used needles and syringes in a special sharps container. Do not put them in a trash can. If you do not have a sharps container, call your pharmacist or healthcare provider to get one. Talk to your pediatrician regarding the use of this medicine in children. While this drug may be prescribed for children as young as 1 month of age for selected conditions, precautions do apply. Overdosage: If you think you have taken too much of this medicine contact a poison control center or emergency room at once. NOTE: This medicine is only for you. Do not share this medicine with others. What if I miss a dose? It is  important not to miss your dose. Call your doctor or health care professional if you miss a dose. What may interact with this medicine? This medicine may interact with the following medications: -medicines that may cause a release of neutrophils, such as lithium This list may not describe all possible interactions. Give your health care provider a list of all the medicines, herbs, non-prescription drugs, or dietary supplements you use. Also tell them if you smoke, drink alcohol, or use illegal drugs. Some items may interact with your medicine. What should I watch for while using this medicine? You may need blood work done while you are taking this medicine. What side effects may I notice from receiving this medicine? Side effects that you should report to your doctor or health care professional as soon as possible: -allergic reactions like skin rash, itching or hives, swelling of the face, lips, or tongue -back pain -blood in the urine -dark urine -dizziness -fast heartbeat -feeling faint -shortness of breath or breathing problems -signs and symptoms of infection like fever or chills; cough; or sore throat -signs and symptoms of kidney injury like trouble passing urine or change in the amount of urine -stomach or side pain, or pain at the shoulder -sweating -swelling of the legs, ankles, or abdomen -tiredness Side effects that usually do not require medical attention (report to your doctor or health care professional if they continue or are bothersome): -bone pain -diarrhea -headache -muscle pain -vomiting This list may not describe all possible side effects. Call your doctor for medical advice about side effects. You may report side effects to FDA at   1-800-FDA-1088. Where should I keep my medicine? Keep out of the reach of children. Store in a refrigerator between 2 and 8 degrees C (36 and 46 degrees F). Keep in carton to protect from light. Throw away this medicine if it is left out  of the refrigerator for more than 5 consecutive days. Throw away any unused medicine after the expiration date. NOTE: This sheet is a summary. It may not cover all possible information. If you have questions about this medicine, talk to your doctor, pharmacist, or health care provider.  2019 Elsevier/Gold Standard (2016-09-17 16:56:18)  

## 2018-07-07 NOTE — Telephone Encounter (Signed)
-----   Message from Heath Lark, MD sent at 07/07/2018 11:28 AM EDT ----- Regarding: labs Her counts are better She does not need GCSF, great news I suggest repeat CBC again next Tuesday plus inj appt Please schedule

## 2018-07-07 NOTE — Telephone Encounter (Signed)
Called and given below message. She verbalized understanding. Scheduling message sent for appt for next Tuesday.

## 2018-07-08 ENCOUNTER — Other Ambulatory Visit (HOSPITAL_COMMUNITY)
Admission: RE | Admit: 2018-07-08 | Discharge: 2018-07-08 | Disposition: A | Discharge status: Home / Self Care | Payer: HRSA Program | Source: Ambulatory Visit | Attending: Radiation Oncology | Admitting: Radiation Oncology

## 2018-07-08 ENCOUNTER — Ambulatory Visit: Payer: Self-pay | Admitting: Radiation Oncology

## 2018-07-08 DIAGNOSIS — Z1159 Encounter for screening for other viral diseases: Secondary | ICD-10-CM | POA: Diagnosis not present

## 2018-07-08 DIAGNOSIS — Z01812 Encounter for preprocedural laboratory examination: Secondary | ICD-10-CM | POA: Diagnosis present

## 2018-07-08 LAB — SARS CORONAVIRUS 2 BY RT PCR (HOSPITAL ORDER, PERFORMED IN ~~LOC~~ HOSPITAL LAB): SARS Coronavirus 2: NEGATIVE

## 2018-07-08 NOTE — Anesthesia Preprocedure Evaluation (Addendum)
Anesthesia Evaluation  Patient identified by MRN, date of birth, ID band Patient awake    Reviewed: Allergy & Precautions, NPO status , Patient's Chart, lab work & pertinent test results  History of Anesthesia Complications Negative for: history of anesthetic complications  Airway Mallampati: I  TM Distance: >3 FB Neck ROM: Full    Dental no notable dental hx. (+) Teeth Intact, Dental Advisory Given   Pulmonary neg pulmonary ROS,    Pulmonary exam normal        Cardiovascular negative cardio ROS Normal cardiovascular exam     Neuro/Psych  Headaches, PSYCHIATRIC DISORDERS    GI/Hepatic GERD  Medicated,Elevated liver enzymes IBS    Endo/Other  negative endocrine ROS  Renal/GU negative Renal ROS     Musculoskeletal negative musculoskeletal ROS (+)   Abdominal   Peds  Hematology  (+) Blood dyscrasia, anemia ,   Anesthesia Other Findings Day of surgery medications reviewed with the patient.  Reproductive/Obstetrics                             Anesthesia Physical  Anesthesia Plan  ASA: II  Anesthesia Plan: General   Post-op Pain Management:    Induction: Intravenous  PONV Risk Score and Plan: 4 or greater and Ondansetron, Dexamethasone, Diphenhydramine and Scopolamine patch - Pre-op  Airway Management Planned: LMA  Additional Equipment:   Intra-op Plan:   Post-operative Plan: Extubation in OR  Informed Consent: I have reviewed the patients History and Physical, chart, labs and discussed the procedure including the risks, benefits and alternatives for the proposed anesthesia with the patient or authorized representative who has indicated his/her understanding and acceptance.     Dental advisory given  Plan Discussed with: CRNA and Anesthesiologist  Anesthesia Plan Comments:        Anesthesia Quick Evaluation

## 2018-07-08 NOTE — Progress Notes (Addendum)
covid 19 test scheduled for 1220 pm today. Spoke with patient

## 2018-07-08 NOTE — Progress Notes (Signed)
Left message for patient to call back and schedule covid 19 test for today

## 2018-07-08 NOTE — Progress Notes (Signed)
SPOKE W/  pt   SCREENING SYMPTOMS OF COVID 19:   COUGH--no  RUNNY NOSE--- no  SORE THROAT---no  NASAL CONGESTION----no  SNEEZING----no  SHORTNESS OF BREATH--no-  DIFFICULTY BREATHING---no  TEMP >100.0 ----no-  UNEXPLAINED BODY ACHES----no--  CHILLS --no------   HEADACHES ----no-----  LOSS OF SMELL/ TASTE ---no-----    HAVE YOU OR ANY FAMILY MEMBER TRAVELLED PAST 14 DAYS OUT OF THE   COUNTY--no- STATE----no COUNTRY---no-  HAVE YOU OR ANY FAMILY MEMBER BEEN EXPOSED TO ANYONE WITH COVID 19? no    

## 2018-07-09 ENCOUNTER — Encounter (HOSPITAL_BASED_OUTPATIENT_CLINIC_OR_DEPARTMENT_OTHER): Payer: Self-pay | Admitting: *Deleted

## 2018-07-09 ENCOUNTER — Ambulatory Visit: Payer: Self-pay | Admitting: Radiation Oncology

## 2018-07-09 ENCOUNTER — Ambulatory Visit
Admission: RE | Admit: 2018-07-09 | Discharge: 2018-07-09 | Disposition: A | Discharge status: Home / Self Care | Payer: Self-pay | Source: Ambulatory Visit | Attending: Radiation Oncology | Admitting: Radiation Oncology

## 2018-07-09 ENCOUNTER — Telehealth: Payer: Self-pay | Admitting: Hematology and Oncology

## 2018-07-09 ENCOUNTER — Telehealth: Payer: Self-pay

## 2018-07-09 ENCOUNTER — Other Ambulatory Visit: Payer: Self-pay

## 2018-07-09 ENCOUNTER — Encounter (HOSPITAL_BASED_OUTPATIENT_CLINIC_OR_DEPARTMENT_OTHER)
Admission: RE | Disposition: A | Discharge status: Other Acute Inpatient Hospital | Payer: Self-pay | Source: Home / Self Care | Attending: Radiation Oncology

## 2018-07-09 ENCOUNTER — Ambulatory Visit (HOSPITAL_BASED_OUTPATIENT_CLINIC_OR_DEPARTMENT_OTHER): Payer: Self-pay | Admitting: Anesthesiology

## 2018-07-09 ENCOUNTER — Ambulatory Visit (HOSPITAL_COMMUNITY)
Admission: RE | Admit: 2018-07-09 | Discharge: 2018-07-09 | Disposition: A | Discharge status: Home / Self Care | Payer: Self-pay | Source: Ambulatory Visit | Attending: Radiation Oncology | Admitting: Radiation Oncology

## 2018-07-09 ENCOUNTER — Ambulatory Visit (HOSPITAL_BASED_OUTPATIENT_CLINIC_OR_DEPARTMENT_OTHER)
Admission: RE | Admit: 2018-07-09 | Discharge: 2018-07-09 | Disposition: A | Discharge status: Other Acute Inpatient Hospital | Payer: Self-pay | Attending: Radiation Oncology | Admitting: Radiation Oncology

## 2018-07-09 VITALS — BP 116/86 | HR 87 | Resp 18

## 2018-07-09 DIAGNOSIS — Z79899 Other long term (current) drug therapy: Secondary | ICD-10-CM | POA: Insufficient documentation

## 2018-07-09 DIAGNOSIS — R51 Headache: Secondary | ICD-10-CM | POA: Insufficient documentation

## 2018-07-09 DIAGNOSIS — Z8249 Family history of ischemic heart disease and other diseases of the circulatory system: Secondary | ICD-10-CM | POA: Insufficient documentation

## 2018-07-09 DIAGNOSIS — C530 Malignant neoplasm of endocervix: Secondary | ICD-10-CM

## 2018-07-09 DIAGNOSIS — C539 Malignant neoplasm of cervix uteri, unspecified: Secondary | ICD-10-CM | POA: Insufficient documentation

## 2018-07-09 DIAGNOSIS — F909 Attention-deficit hyperactivity disorder, unspecified type: Secondary | ICD-10-CM | POA: Insufficient documentation

## 2018-07-09 DIAGNOSIS — D069 Carcinoma in situ of cervix, unspecified: Secondary | ICD-10-CM | POA: Insufficient documentation

## 2018-07-09 DIAGNOSIS — K589 Irritable bowel syndrome without diarrhea: Secondary | ICD-10-CM | POA: Insufficient documentation

## 2018-07-09 DIAGNOSIS — Z833 Family history of diabetes mellitus: Secondary | ICD-10-CM | POA: Insufficient documentation

## 2018-07-09 DIAGNOSIS — K219 Gastro-esophageal reflux disease without esophagitis: Secondary | ICD-10-CM | POA: Insufficient documentation

## 2018-07-09 DIAGNOSIS — R748 Abnormal levels of other serum enzymes: Secondary | ICD-10-CM | POA: Insufficient documentation

## 2018-07-09 DIAGNOSIS — Z809 Family history of malignant neoplasm, unspecified: Secondary | ICD-10-CM | POA: Insufficient documentation

## 2018-07-09 DIAGNOSIS — D649 Anemia, unspecified: Secondary | ICD-10-CM | POA: Insufficient documentation

## 2018-07-09 DIAGNOSIS — Z9221 Personal history of antineoplastic chemotherapy: Secondary | ICD-10-CM | POA: Insufficient documentation

## 2018-07-09 HISTORY — PX: TANDEM RING INSERTION: SHX6199

## 2018-07-09 HISTORY — PX: OPERATIVE ULTRASOUND: SHX5996

## 2018-07-09 LAB — CBC WITH DIFFERENTIAL (CANCER CENTER ONLY)
Abs Immature Granulocytes: 0.01 10*3/uL (ref 0.00–0.07)
Basophils Absolute: 0 10*3/uL (ref 0.0–0.1)
Basophils Relative: 0 %
Eosinophils Absolute: 0 10*3/uL (ref 0.0–0.5)
Eosinophils Relative: 0 %
HCT: 27.6 % — ABNORMAL LOW (ref 36.0–46.0)
Hemoglobin: 9 g/dL — ABNORMAL LOW (ref 12.0–15.0)
Immature Granulocytes: 0 %
Lymphocytes Relative: 10 %
Lymphs Abs: 0.3 10*3/uL — ABNORMAL LOW (ref 0.7–4.0)
MCH: 32.4 pg (ref 26.0–34.0)
MCHC: 32.6 g/dL (ref 30.0–36.0)
MCV: 99.3 fL (ref 80.0–100.0)
Monocytes Absolute: 0.1 10*3/uL (ref 0.1–1.0)
Monocytes Relative: 2 %
Neutro Abs: 3 10*3/uL (ref 1.7–7.7)
Neutrophils Relative %: 88 %
Platelet Count: 284 10*3/uL (ref 150–400)
RBC: 2.78 MIL/uL — ABNORMAL LOW (ref 3.87–5.11)
RDW: 18.7 % — ABNORMAL HIGH (ref 11.5–15.5)
WBC Count: 3.4 10*3/uL — ABNORMAL LOW (ref 4.0–10.5)
nRBC: 0 % (ref 0.0–0.2)

## 2018-07-09 SURGERY — INSERTION, UTERINE TANDEM AND RING OR CYLINDER, FOR BRACHYTHERAPY
Anesthesia: General | Site: Vagina

## 2018-07-09 MED ORDER — LIDOCAINE 2% (20 MG/ML) 5 ML SYRINGE
INTRAMUSCULAR | Status: AC
  Filled 2018-07-09: qty 5

## 2018-07-09 MED ORDER — FENTANYL CITRATE (PF) 100 MCG/2ML IJ SOLN
INTRAMUSCULAR | Status: AC
  Filled 2018-07-09: qty 2

## 2018-07-09 MED ORDER — SCOPOLAMINE 1 MG/3DAYS TD PT72
MEDICATED_PATCH | TRANSDERMAL | Status: AC
  Filled 2018-07-09: qty 1

## 2018-07-09 MED ORDER — MIDAZOLAM HCL 2 MG/2ML IJ SOLN
INTRAMUSCULAR | Status: AC
  Filled 2018-07-09: qty 2

## 2018-07-09 MED ORDER — SCOPOLAMINE 1 MG/3DAYS TD PT72
MEDICATED_PATCH | TRANSDERMAL | Status: DC | PRN
  Administered 2018-07-09: 1 via TRANSDERMAL

## 2018-07-09 MED ORDER — LACTATED RINGERS IV SOLN
INTRAVENOUS | Status: DC
  Administered 2018-07-09: 10:00:00 via INTRAVENOUS
  Filled 2018-07-09 (×2): qty 250

## 2018-07-09 MED ORDER — ACETAMINOPHEN 500 MG PO TABS
1000.0000 mg | ORAL_TABLET | Freq: Once | ORAL | Status: AC
  Administered 2018-07-09: 07:00:00 1000 mg via ORAL
  Filled 2018-07-09: qty 2

## 2018-07-09 MED ORDER — DEXAMETHASONE SODIUM PHOSPHATE 10 MG/ML IJ SOLN
INTRAMUSCULAR | Status: DC | PRN
  Administered 2018-07-09: 5 mg via INTRAVENOUS

## 2018-07-09 MED ORDER — KETOROLAC TROMETHAMINE 30 MG/ML IJ SOLN
INTRAMUSCULAR | Status: DC | PRN
  Administered 2018-07-09: 30 mg via INTRAVENOUS

## 2018-07-09 MED ORDER — SODIUM CHLORIDE 0.9 % IR SOLN
Status: DC | PRN
  Administered 2018-07-09: 200 mL via INTRAVESICAL

## 2018-07-09 MED ORDER — FENTANYL CITRATE (PF) 100 MCG/2ML IJ SOLN
25.0000 ug | INTRAMUSCULAR | Status: DC | PRN
  Administered 2018-07-09 (×2): 25 ug via INTRAVENOUS
  Filled 2018-07-09: qty 1

## 2018-07-09 MED ORDER — ACETAMINOPHEN 500 MG PO TABS
ORAL_TABLET | ORAL | Status: AC
  Filled 2018-07-09: qty 2

## 2018-07-09 MED ORDER — PROPOFOL 10 MG/ML IV BOLUS
INTRAVENOUS | Status: AC
  Filled 2018-07-09: qty 40

## 2018-07-09 MED ORDER — ONDANSETRON HCL 4 MG/2ML IJ SOLN
INTRAMUSCULAR | Status: DC | PRN
  Administered 2018-07-09: 4 mg via INTRAVENOUS

## 2018-07-09 MED ORDER — PROPOFOL 10 MG/ML IV BOLUS
INTRAVENOUS | Status: DC | PRN
  Administered 2018-07-09: 150 mg via INTRAVENOUS

## 2018-07-09 MED ORDER — HYDROMORPHONE HCL 1 MG/ML IJ SOLN
0.5000 mg | Freq: Once | INTRAMUSCULAR | Status: AC
  Administered 2018-07-09: 0.5 mg via INTRAVENOUS
  Filled 2018-07-09: qty 1

## 2018-07-09 MED ORDER — HYDROMORPHONE HCL 1 MG/ML IJ SOLN
0.5000 mg | Freq: Once | INTRAMUSCULAR | Status: AC
  Administered 2018-07-09: 10:00:00 0.5 mg via INTRAVENOUS
  Filled 2018-07-09: qty 1

## 2018-07-09 MED ORDER — ONDANSETRON HCL 4 MG/2ML IJ SOLN
INTRAMUSCULAR | Status: AC
  Filled 2018-07-09: qty 2

## 2018-07-09 MED ORDER — FENTANYL CITRATE (PF) 100 MCG/2ML IJ SOLN
INTRAMUSCULAR | Status: DC | PRN
  Administered 2018-07-09 (×2): 50 ug via INTRAVENOUS

## 2018-07-09 MED ORDER — LIDOCAINE 2% (20 MG/ML) 5 ML SYRINGE
INTRAMUSCULAR | Status: DC | PRN
  Administered 2018-07-09: 60 mg via INTRAVENOUS

## 2018-07-09 MED ORDER — ARTIFICIAL TEARS OPHTHALMIC OINT
TOPICAL_OINTMENT | OPHTHALMIC | Status: AC
  Filled 2018-07-09: qty 3.5

## 2018-07-09 MED ORDER — ONDANSETRON HCL 4 MG/2ML IJ SOLN
4.0000 mg | Freq: Once | INTRAMUSCULAR | Status: DC | PRN
  Filled 2018-07-09: qty 2

## 2018-07-09 MED ORDER — ACETAMINOPHEN 325 MG PO TABS
325.0000 mg | ORAL_TABLET | ORAL | Status: DC | PRN
  Filled 2018-07-09: qty 2

## 2018-07-09 MED ORDER — ACETAMINOPHEN 160 MG/5ML PO SOLN
325.0000 mg | ORAL | Status: DC | PRN
  Filled 2018-07-09: qty 20.3

## 2018-07-09 MED ORDER — LACTATED RINGERS IV SOLN
INTRAVENOUS | Status: DC
  Administered 2018-07-09: 06:00:00 via INTRAVENOUS
  Filled 2018-07-09: qty 1000

## 2018-07-09 MED ORDER — MIDAZOLAM HCL 2 MG/2ML IJ SOLN
INTRAMUSCULAR | Status: DC | PRN
  Administered 2018-07-09: 2 mg via INTRAVENOUS

## 2018-07-09 MED ORDER — KETOROLAC TROMETHAMINE 30 MG/ML IJ SOLN
INTRAMUSCULAR | Status: AC
  Filled 2018-07-09: qty 1

## 2018-07-09 MED ORDER — OXYCODONE HCL 5 MG/5ML PO SOLN
5.0000 mg | Freq: Once | ORAL | Status: DC | PRN
  Filled 2018-07-09: qty 5

## 2018-07-09 MED ORDER — MEPERIDINE HCL 25 MG/ML IJ SOLN
6.2500 mg | INTRAMUSCULAR | Status: DC | PRN
  Filled 2018-07-09: qty 1

## 2018-07-09 MED ORDER — DEXAMETHASONE SODIUM PHOSPHATE 10 MG/ML IJ SOLN
INTRAMUSCULAR | Status: AC
  Filled 2018-07-09: qty 1

## 2018-07-09 MED ORDER — HYDROMORPHONE HCL 1 MG/ML IJ SOLN
0.5000 mg | Freq: Once | INTRAMUSCULAR | Status: AC
  Administered 2018-07-09: 13:00:00 0.5 mg via INTRAVENOUS
  Filled 2018-07-09: qty 1

## 2018-07-09 MED ORDER — OXYCODONE HCL 5 MG PO TABS
5.0000 mg | ORAL_TABLET | Freq: Once | ORAL | Status: DC | PRN
  Filled 2018-07-09: qty 1

## 2018-07-09 SURGICAL SUPPLY — 19 items
BNDG CONFORM 2 STRL LF (GAUZE/BANDAGES/DRESSINGS) IMPLANT
COVER WAND RF STERILE (DRAPES) ×4 IMPLANT
DILATOR CANAL MILEX (MISCELLANEOUS) IMPLANT
GAUZE 4X4 16PLY RFD (DISPOSABLE) ×4 IMPLANT
GLOVE BIO SURGEON STRL SZ7.5 (GLOVE) ×8 IMPLANT
GOWN STRL REUS W/TWL LRG LVL3 (GOWN DISPOSABLE) ×4 IMPLANT
HOLDER FOLEY CATH W/STRAP (MISCELLANEOUS) ×4 IMPLANT
HOVERMATT SINGLE USE (MISCELLANEOUS) ×4 IMPLANT
IV NS 1000ML (IV SOLUTION) ×2
IV NS 1000ML BAXH (IV SOLUTION) ×2 IMPLANT
IV SET EXTENSION GRAVITY 40 LF (IV SETS) ×4 IMPLANT
KIT TURNOVER CYSTO (KITS) ×4 IMPLANT
PACK VAGINAL MINOR WOMEN LF (CUSTOM PROCEDURE TRAY) ×4 IMPLANT
PACKING VAGINAL (PACKING) IMPLANT
PAD ABD 8X10 STRL (GAUZE/BANDAGES/DRESSINGS) ×4 IMPLANT
PAD OB MATERNITY 4.3X12.25 (PERSONAL CARE ITEMS) IMPLANT
TOWEL OR 17X26 10 PK STRL BLUE (TOWEL DISPOSABLE) ×4 IMPLANT
TRAY FOLEY W/BAG SLVR 14FR (SET/KITS/TRAYS/PACK) ×4 IMPLANT
WATER STERILE IRR 500ML POUR (IV SOLUTION) ×4 IMPLANT

## 2018-07-09 NOTE — Progress Notes (Signed)
Patient tolerated well she was discharged via wheel chair. She is to have labs prior to leaving today.

## 2018-07-09 NOTE — Anesthesia Postprocedure Evaluation (Signed)
Anesthesia Post Note  Patient: Sheena Torres  Procedure(s) Performed: TANDEM RING INSERTION (N/A Vagina ) OPERATIVE ULTRASOUND (N/A Abdomen)     Patient location during evaluation: PACU Anesthesia Type: General Level of consciousness: awake and alert Pain management: pain level controlled Vital Signs Assessment: post-procedure vital signs reviewed and stable Respiratory status: spontaneous breathing, nonlabored ventilation, respiratory function stable and patient connected to nasal cannula oxygen Cardiovascular status: blood pressure returned to baseline and stable Postop Assessment: no apparent nausea or vomiting Anesthetic complications: no    Last Vitals:  Vitals:   07/09/18 0830 07/09/18 0845  BP: 99/67 103/69  Pulse: 85 81  Resp: 13 (!) 8  Temp:    SpO2: 100% 99%    Last Pain:  Vitals:   07/09/18 0850  TempSrc:   PainSc: 2                  Haskel Dewalt

## 2018-07-09 NOTE — Telephone Encounter (Signed)
Spoke with patient re lab/injection 6/2. Patient requesting call re more info about 6/2 appointments. Per patient she had last minute labs at her visit with Dr. Sondra Come today.   Message routed to NG/desk nurse/

## 2018-07-09 NOTE — Anesthesia Procedure Notes (Signed)
Procedure Name: LMA Insertion Date/Time: 07/09/2018 7:31 AM Performed by: Wanita Chamberlain, CRNA Pre-anesthesia Checklist: Patient identified, Timeout performed, Emergency Drugs available, Suction available and Patient being monitored Patient Re-evaluated:Patient Re-evaluated prior to induction Oxygen Delivery Method: Circle system utilized Preoxygenation: Pre-oxygenation with 100% oxygen Induction Type: IV induction Ventilation: Mask ventilation without difficulty LMA: LMA inserted LMA Size: 4.0 Number of attempts: 1 Placement Confirmation: breath sounds checked- equal and bilateral,  CO2 detector and positive ETCO2 Tube secured with: Tape Dental Injury: Teeth and Oropharynx as per pre-operative assessment

## 2018-07-09 NOTE — Op Note (Signed)
07/09/2018  8:27 AM  PATIENT:  Sheena Torres  53 y.o. female  PRE-OPERATIVE DIAGNOSIS:  ENDO CERVIX CANCER  POST-OPERATIVE DIAGNOSIS:  ENDO CERVIX CANCER  PROCEDURE:  Procedure(s): TANDEM RING INSERTION (N/A) OPERATIVE ULTRASOUND (N/A)  SURGEON:  Surgeon(s) and Role:    * Gery Pray, MD - Primary  PHYSICIAN ASSISTANT:   ASSISTANTS: none   ANESTHESIA:   general  EBL:  2 mL  BLOOD ADMINISTERED:none  DRAINS: Urinary Catheter (Foley)   LOCAL MEDICATIONS USED:  NONE  SPECIMEN:  No Specimen  DISPOSITION OF SPECIMEN:  N/A  COUNTS:  YES  TOURNIQUET:  * No tourniquets in log *  DICTATION: Patient was taken to outpatient OR #3.  Timeout performed for the procedure, estimated length of the procedure, preoperative medications.  Patient was prepped and draped in the usual sterile fashion and placed in the dorsolithotomy position.  A Foley catheter was placed without difficulty.  The Foley catheter was backfilled with approximately 250 cc of sterile water for imaging purposes.  Exam under anesthesia revealed excellent response to her external beam radiation and radiosensitizing chemotherapy.  The cervical was estimated to be approximately 2 and half by 2-1/2 cm.  There is some erythema to the anterior lip of the cervix but no visible lesion.  On bimanual  examination there is no parametrial extension appreciated.  Patient proceeded to undergo sounding of the uterus.  The uterus was moderately anteverted and sounded to a length of 8.0 cm.  Excellent intraoperative ultrasound images were obtained.  Patient then proceeded to undergo serial dilation of the cervical os.  She then had placement of a 60 mm cervical sleeve within the endometrial cavity and endocervical canal.  Was then followed by placement of a 60 mm, 45 degree tandem within the cervical sleeve.  Intraoperative ultrasound showed good placement centrally within the uterus.  Patient then had placement of a 45 degree ring with a  large shielding In place.  This was then followed by placement of a rectal paddle posteriorly.  Final intraoperative imaging showed good placement of the tandem ring apparatus for the patient's second high-dose-rate radiation treatment.  Patient was subsequently transported to the recovery room in stable condition.  Later in day the patient will be  transported to radiation oncology for planning and her second high-dose-rate treatment.  Patient is to receive 5.5 Gy to the high risk clinical target volume.  She will be treated with iridium 192 is the high-dose-rate source.  PLAN OF CARE: Transferred to radiation oncology for planning treatment  PATIENT DISPOSITION:  PACU - hemodynamically stable.   Delay start of Pharmacological VTE agent (>24hrs) due to surgical blood loss or risk of bleeding: not applicable

## 2018-07-09 NOTE — Transfer of Care (Signed)
Immediate Anesthesia Transfer of Care Note  Patient: Sheena Torres  Procedure(s) Performed: TANDEM RING INSERTION (N/A Vagina ) OPERATIVE ULTRASOUND (N/A Abdomen)  Patient Location: PACU  Anesthesia Type:General  Level of Consciousness: awake, alert , oriented and patient cooperative  Airway & Oxygen Therapy: Patient Spontanous Breathing and Patient connected to nasal cannula oxygen  Post-op Assessment: Report given to RN and Post -op Vital signs reviewed and stable  Post vital signs: Reviewed and stable  Last Vitals:  Vitals Value Taken Time  BP    Temp    Pulse    Resp    SpO2      Last Pain:  Vitals:   07/09/18 0531  TempSrc: Oral         Complications: No apparent anesthesia complications

## 2018-07-09 NOTE — Interval H&P Note (Signed)
History and Physical Interval Note:  07/09/2018 7:24 AM  Sheena Torres  has presented today for surgery, with the diagnosis of ENDO CERVIX CANCER.  The various methods of treatment have been discussed with the patient and family. After consideration of risks, benefits and other options for treatment, the patient has consented to  Procedure(s): TANDEM RING INSERTION (N/A) OPERATIVE ULTRASOUND (N/A) as a surgical intervention.  The patient's history has been reviewed, patient examined, no change in status, stable for surgery.  I have reviewed the patient's chart and labs.  Questions were answered to the patient's satisfaction.     Gery Pray

## 2018-07-09 NOTE — Discharge Instructions (Signed)

## 2018-07-09 NOTE — Progress Notes (Signed)
Patient received from PACU taken to CT sim. Received from CT sim via stretcher she is awake and aware of surroundings. Tolerating oral intake well. VSS. Clear yellow urine per foley. Mayra Neer RN

## 2018-07-09 NOTE — Progress Notes (Signed)
  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  Identity was confirmed.  All relevant records and images related to the planned course of therapy were reviewed.  The patient freely provided informed written consent to proceed with treatment after reviewing the details related to the planned course of therapy. The consent form was witnessed and verified by the simulation staff.  Then, the patient was set-up in a stable reproducible  supine position for radiation therapy.  CT images were obtained.  Surface markings were placed.  The CT images were loaded into the planning software.  Then the target and avoidance structures were contoured.  Treatment planning then occurred.  The radiation prescription was entered and confirmed.   I have requested : Brachytherapy Isodose Plan and Dosimetry Calculations to plan the radiation distribution.    PLAN:  The patient will receive 5.5 Gy in 1 fraction to the high-risk clinical target volume.  Iridium 192 will be the high-dose-rate source.  Patient will be treated with the tandem/ring system.    ________________________________  Blair Promise, PhD, MD  This document serves as a record of services personally performed by Gery Pray, MD. It was created on his behalf by Rae Lips, a trained medical scribe. The creation of this record is based on the scribe's personal observations and the provider's statements to them. This document has been checked and approved by the attending provider.

## 2018-07-09 NOTE — Progress Notes (Signed)
  Radiation Oncology         (336) [phone] ________________________________  Name: Sheena Torres MRN: [id number]  Date: 07/09/2018  DOB: [birth year]  CC: [name], MD  HDR BRACHYTHERAPY NOTE  DIAGNOSIS: 53 y.o. 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 Identity was confirmed. All relevant records and images related to the planned course of therapy were reviewed. The patient freely provided informed written consent to proceed with treatment after reviewing the details related to the planned course of therapy. The consent form was witnessed and verified by the simulation staff. Then, the patient was set-up in a stable reproducible supine position for radiation therapy. The tandem ring system was accessed and fiducial markers were placed within the tandem and ring.   Simple treatment device note: On the operating room the patient had construction of her custom tandem ring system. She will be treated with a 45 tandem/ring system. The patient had placement of a 60 mm tandem. A cervical ring with a large shielding was used for her treatment. A rectal paddle was also part of her custom set up device.  Verification simulation note: An AP and lateral film was obtained through the pelvis area. This was compared to the patient's planning films documenting accurate position of the tandem/ring system for treatment.  High-dose-rate brachytherapy treatment note:  The remote afterloading device was accessed through catheter system and attached to the tandem ring system. Patient then proceeded to undergo her second high-dose-rate treatment directed at the cervix. The patient was prescribed a dose of 5.5 gray to be delivered to the high-risk clinical target volume.. Patient was treated with 2 channels using 12+12=24 dwell positions. Treatment time was 528.6 seconds. The patient tolerated the procedure well. After  completion of her therapy, a radiation survey was performed documenting return of the iridium source into the GammaMed safe. The patient was then transferred to the nursing suite. She then had removal of the rectal paddle followed by the tandem and ring system. The patient tolerated the removal well.  PLAN: The patient will return on 07/13/2018 for her third HDR treatment. ________________________________  Blair Promise, PhD, MD  This document serves as a record of services personally performed by Gery Pray, MD. It was created on his behalf by Rae Lips, a trained medical scribe. The creation of this record is based on the scribe's personal observations and the provider's statements to them. This document has been checked and approved by the attending provider.

## 2018-07-10 ENCOUNTER — Other Ambulatory Visit: Payer: Self-pay

## 2018-07-10 ENCOUNTER — Encounter (HOSPITAL_BASED_OUTPATIENT_CLINIC_OR_DEPARTMENT_OTHER): Payer: Self-pay | Admitting: Radiation Oncology

## 2018-07-10 NOTE — Telephone Encounter (Signed)
Hi Sheena Torres,  Explain to her even though she just finished her treatment her blood counts may still run low for a while She will not be given an injection unless she needed it If her blood counts continue to trend up then we will stop her weekly lab draws

## 2018-07-10 NOTE — Progress Notes (Signed)
Spoke with Sheena Torres  Npo after midnight arrive 845 am 07-13-2018 [facility]bc with dif 07-07-2018 epic/chart Prn meds with sip of water Has surgery orders in epic Driver son phillip cell [phone] oe levi cell [phone]

## 2018-07-10 NOTE — Progress Notes (Signed)
May use 07-08-2018 covid test result for all remaining tandem procedures per beverly harrelson rn and dr wyatt due to patient is self isolating. No further covid testing need.

## 2018-07-10 NOTE — Telephone Encounter (Signed)
Spoke to patient and advised patient will need to have her blood counts monitored. She is hesitant to come in on Tuesday for labs when they were drawn yesterday. She agrees to come in and have the lab again with the anticipation she will have the injection following the results if needed.

## 2018-07-11 ENCOUNTER — Other Ambulatory Visit (HOSPITAL_COMMUNITY): Payer: Self-pay | Admitting: Radiation Oncology

## 2018-07-11 DIAGNOSIS — C530 Malignant neoplasm of endocervix: Secondary | ICD-10-CM

## 2018-07-13 ENCOUNTER — Ambulatory Visit
Admission: RE | Admit: 2018-07-13 | Discharge: 2018-07-13 | Disposition: A | Discharge status: Home / Self Care | Payer: Self-pay | Source: Ambulatory Visit | Attending: Radiation Oncology | Admitting: Radiation Oncology

## 2018-07-13 ENCOUNTER — Ambulatory Visit (HOSPITAL_COMMUNITY)
Admission: RE | Admit: 2018-07-13 | Discharge: 2018-07-13 | Disposition: A | Discharge status: Home / Self Care | Payer: Self-pay | Source: Ambulatory Visit | Attending: Radiation Oncology | Admitting: Radiation Oncology

## 2018-07-13 ENCOUNTER — Ambulatory Visit (HOSPITAL_BASED_OUTPATIENT_CLINIC_OR_DEPARTMENT_OTHER): Payer: Self-pay | Admitting: Anesthesiology

## 2018-07-13 ENCOUNTER — Other Ambulatory Visit: Payer: Self-pay

## 2018-07-13 ENCOUNTER — Ambulatory Visit (HOSPITAL_BASED_OUTPATIENT_CLINIC_OR_DEPARTMENT_OTHER)
Admission: RE | Admit: 2018-07-13 | Discharge: 2018-07-13 | Disposition: A | Discharge status: Other Acute Inpatient Hospital | Payer: Self-pay | Attending: Radiation Oncology | Admitting: Radiation Oncology

## 2018-07-13 ENCOUNTER — Encounter (HOSPITAL_BASED_OUTPATIENT_CLINIC_OR_DEPARTMENT_OTHER)
Admission: RE | Disposition: A | Discharge status: Other Acute Inpatient Hospital | Payer: Self-pay | Source: Home / Self Care | Attending: Radiation Oncology

## 2018-07-13 ENCOUNTER — Encounter (HOSPITAL_BASED_OUTPATIENT_CLINIC_OR_DEPARTMENT_OTHER): Payer: Self-pay | Admitting: Anesthesiology

## 2018-07-13 DIAGNOSIS — C530 Malignant neoplasm of endocervix: Secondary | ICD-10-CM

## 2018-07-13 DIAGNOSIS — Z79899 Other long term (current) drug therapy: Secondary | ICD-10-CM | POA: Insufficient documentation

## 2018-07-13 DIAGNOSIS — K589 Irritable bowel syndrome without diarrhea: Secondary | ICD-10-CM | POA: Insufficient documentation

## 2018-07-13 DIAGNOSIS — Z510 Encounter for antineoplastic radiation therapy: Secondary | ICD-10-CM | POA: Insufficient documentation

## 2018-07-13 DIAGNOSIS — C539 Malignant neoplasm of cervix uteri, unspecified: Secondary | ICD-10-CM

## 2018-07-13 DIAGNOSIS — K219 Gastro-esophageal reflux disease without esophagitis: Secondary | ICD-10-CM | POA: Insufficient documentation

## 2018-07-13 DIAGNOSIS — G43909 Migraine, unspecified, not intractable, without status migrainosus: Secondary | ICD-10-CM | POA: Insufficient documentation

## 2018-07-13 HISTORY — PX: TANDEM RING INSERTION: SHX6199

## 2018-07-13 HISTORY — PX: OPERATIVE ULTRASOUND: SHX5996

## 2018-07-13 SURGERY — INSERTION, UTERINE TANDEM AND RING OR CYLINDER, FOR BRACHYTHERAPY
Anesthesia: General | Site: Cervix

## 2018-07-13 MED ORDER — PROPOFOL 10 MG/ML IV BOLUS
INTRAVENOUS | Status: DC | PRN
  Administered 2018-07-13: 150 mg via INTRAVENOUS

## 2018-07-13 MED ORDER — HYDROMORPHONE HCL 1 MG/ML IJ SOLN
0.5000 mg | Freq: Once | INTRAMUSCULAR | Status: AC
  Administered 2018-07-13: 0.5 mg via INTRAVENOUS
  Filled 2018-07-13: qty 1

## 2018-07-13 MED ORDER — FENTANYL CITRATE (PF) 100 MCG/2ML IJ SOLN
INTRAMUSCULAR | Status: AC
  Filled 2018-07-13: qty 2

## 2018-07-13 MED ORDER — LIDOCAINE HCL (CARDIAC) PF 100 MG/5ML IV SOSY
PREFILLED_SYRINGE | INTRAVENOUS | Status: DC | PRN
  Administered 2018-07-13: 60 mg via INTRAVENOUS

## 2018-07-13 MED ORDER — KETOROLAC TROMETHAMINE 30 MG/ML IJ SOLN
INTRAMUSCULAR | Status: DC | PRN
  Administered 2018-07-13: 30 mg via INTRAVENOUS

## 2018-07-13 MED ORDER — PROMETHAZINE HCL 25 MG/ML IJ SOLN
6.2500 mg | INTRAMUSCULAR | Status: DC | PRN
  Filled 2018-07-13: qty 1

## 2018-07-13 MED ORDER — LIDOCAINE 2% (20 MG/ML) 5 ML SYRINGE
INTRAMUSCULAR | Status: AC
  Filled 2018-07-13: qty 5

## 2018-07-13 MED ORDER — FENTANYL CITRATE (PF) 100 MCG/2ML IJ SOLN
INTRAMUSCULAR | Status: DC | PRN
  Administered 2018-07-13 (×8): 25 ug via INTRAVENOUS

## 2018-07-13 MED ORDER — MIDAZOLAM HCL 2 MG/2ML IJ SOLN
INTRAMUSCULAR | Status: AC
  Filled 2018-07-13: qty 2

## 2018-07-13 MED ORDER — PROPOFOL 10 MG/ML IV BOLUS
INTRAVENOUS | Status: AC
  Filled 2018-07-13: qty 20

## 2018-07-13 MED ORDER — FENTANYL CITRATE (PF) 100 MCG/2ML IJ SOLN
25.0000 ug | INTRAMUSCULAR | Status: DC | PRN
  Filled 2018-07-13: qty 1

## 2018-07-13 MED ORDER — DEXAMETHASONE SODIUM PHOSPHATE 4 MG/ML IJ SOLN
INTRAMUSCULAR | Status: DC | PRN
  Administered 2018-07-13: 10 mg via INTRAVENOUS

## 2018-07-13 MED ORDER — SCOPOLAMINE 1 MG/3DAYS TD PT72
MEDICATED_PATCH | TRANSDERMAL | Status: AC
  Filled 2018-07-13: qty 1

## 2018-07-13 MED ORDER — DEXAMETHASONE SODIUM PHOSPHATE 10 MG/ML IJ SOLN
INTRAMUSCULAR | Status: AC
  Filled 2018-07-13: qty 1

## 2018-07-13 MED ORDER — MIDAZOLAM HCL 5 MG/5ML IJ SOLN
INTRAMUSCULAR | Status: DC | PRN
  Administered 2018-07-13: 2 mg via INTRAVENOUS

## 2018-07-13 MED ORDER — ONDANSETRON HCL 4 MG/2ML IJ SOLN
INTRAMUSCULAR | Status: DC | PRN
  Administered 2018-07-13: 4 mg via INTRAVENOUS

## 2018-07-13 MED ORDER — LACTATED RINGERS IV SOLN
INTRAVENOUS | Status: DC
  Administered 2018-07-13: 09:00:00 via INTRAVENOUS
  Filled 2018-07-13: qty 1000

## 2018-07-13 MED ORDER — ONDANSETRON HCL 4 MG/2ML IJ SOLN
INTRAMUSCULAR | Status: AC
  Filled 2018-07-13: qty 2

## 2018-07-13 MED ORDER — SCOPOLAMINE 1 MG/3DAYS TD PT72
1.0000 | MEDICATED_PATCH | TRANSDERMAL | Status: DC
  Administered 2018-07-13: 1.5 mg via TRANSDERMAL
  Filled 2018-07-13: qty 1

## 2018-07-13 MED ORDER — SODIUM CHLORIDE 0.9 % IR SOLN
Status: DC | PRN
  Administered 2018-07-13: 1 via INTRAVESICAL

## 2018-07-13 MED ORDER — KETOROLAC TROMETHAMINE 30 MG/ML IJ SOLN
INTRAMUSCULAR | Status: AC
  Filled 2018-07-13: qty 1

## 2018-07-13 MED ORDER — LACTATED RINGERS IV SOLN
INTRAVENOUS | Status: DC
  Administered 2018-07-13: 14:00:00 via INTRAVENOUS
  Filled 2018-07-13 (×2): qty 250

## 2018-07-13 SURGICAL SUPPLY — 19 items
BNDG CONFORM 2 STRL LF (GAUZE/BANDAGES/DRESSINGS) ×4 IMPLANT
COVER WAND RF STERILE (DRAPES) ×4 IMPLANT
DILATOR CANAL MILEX (MISCELLANEOUS) IMPLANT
GAUZE 4X4 16PLY RFD (DISPOSABLE) ×4 IMPLANT
GLOVE BIO SURGEON STRL SZ7.5 (GLOVE) ×8 IMPLANT
GOWN STRL REUS W/TWL LRG LVL3 (GOWN DISPOSABLE) ×4 IMPLANT
HOLDER FOLEY CATH W/STRAP (MISCELLANEOUS) ×4 IMPLANT
HOVERMATT SINGLE USE (MISCELLANEOUS) ×4 IMPLANT
IV NS 1000ML (IV SOLUTION) ×2
IV NS 1000ML BAXH (IV SOLUTION) ×2 IMPLANT
IV SET EXTENSION GRAVITY 40 LF (IV SETS) ×4 IMPLANT
KIT TURNOVER CYSTO (KITS) ×4 IMPLANT
PACK VAGINAL MINOR WOMEN LF (CUSTOM PROCEDURE TRAY) ×4 IMPLANT
PACKING VAGINAL (PACKING) IMPLANT
PAD ABD 8X10 STRL (GAUZE/BANDAGES/DRESSINGS) ×4 IMPLANT
PAD OB MATERNITY 4.3X12.25 (PERSONAL CARE ITEMS) ×4 IMPLANT
TOWEL OR 17X26 10 PK STRL BLUE (TOWEL DISPOSABLE) ×4 IMPLANT
TRAY FOLEY W/BAG SLVR 14FR (SET/KITS/TRAYS/PACK) ×4 IMPLANT
WATER STERILE IRR 500ML POUR (IV SOLUTION) ×4 IMPLANT

## 2018-07-13 NOTE — Interval H&P Note (Signed)
History and Physical Interval Note:  07/13/2018 10:48 AM  Sheena Torres  has presented today for surgery, with the diagnosis of ENDO CERVIX.  The various methods of treatment have been discussed with the patient and family. After consideration of risks, benefits and other options for treatment, the patient has consented to  Procedure(s): TANDEM RING INSERTION (N/A) OPERATIVE ULTRASOUND (N/A) as a surgical intervention.  The patient's history has been reviewed, patient examined, no change in status, stable for surgery.  I have reviewed the patient's chart and labs.  Questions were answered to the patient's satisfaction.     Gery Pray

## 2018-07-13 NOTE — Progress Notes (Signed)
  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 Identity was confirmed. All relevant records and images related to the planned course of therapy were reviewed. The patient freely provided informed written consent to proceed with treatment after reviewing the details related to the planned course of therapy. The consent form was witnessed and verified by the simulation staff. Then, the patient was set-up in a stable reproducible supine position for radiation therapy. The tandem ring system was accessed and fiducial markers were placed within the tandem and ring.   Simple treatment device note: On the operating room the patient had construction of her custom tandem ring system. She will be treated with a 45 tandem/ring system. The patient had placement of a 60 mm tandem. A cervical ring with a small shielding was used for her treatment. A rectal paddle was also part of her custom set up device.  Verification simulation note: An AP and lateral film was obtained through the pelvis area. This was compared to the patient's planning films documenting accurate position of the tandem/ring system for treatment.  High-dose-rate brachytherapy treatment note:  The remote afterloading device was accessed through catheter system and attached to the tandem ring system. Patient then proceeded to undergo her third high-dose-rate treatment directed at the cervix. The patient was prescribed a dose of 5.5 gray to be delivered to the high risk clinical target volume.. Patient was treated with 2 channels using 25 dwell positions. Treatment time was 538.2 seconds. The patient tolerated the procedure well. After completion of her therapy, a  radiation survey was performed documenting return of the iridium source into the GammaMed safe. The patient was then transferred to the nursing suite. She then had removal of the rectal paddle followed by the tandem and ring system. The patient tolerated the removal well.  PLAN: She will return 07/21/18 for her fourth HDR treatment.  ________________________________  Blair Promise, PhD, MD This document serves as a record of services personally performed by Gery Pray, MD. It was created on his behalf by Mary-Margaret Loma Messing, a trained medical scribe. The creation of this record is based on the scribe's personal observations and the provider's statements to them. This document has been checked and approved by the attending provider.

## 2018-07-13 NOTE — Progress Notes (Signed)
  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  Identity was confirmed.  All relevant records and images related to the planned course of therapy were reviewed.  The patient freely provided informed written consent to proceed with treatment after reviewing the details related to the planned course of therapy. The consent form was witnessed and verified by the simulation staff.  Then, the patient was set-up in a stable reproducible  supine position for radiation therapy.  CT images were obtained.  Surface markings were placed.  The CT images were loaded into the planning software.  Then the target and avoidance structures were contoured.  Treatment planning then occurred.  The radiation prescription was entered and confirmed.   I have requested : Brachytherapy Isodose Plan and Dosimetry Calculations to plan the radiation distribution.    PLAN:  The patient will receive 5.5 Gy in 1 fraction.  She will be treated with the tandem ring system.  Iridium 192 will be the high-dose-rate source.   ________________________________  Blair Promise, PhD, MD   This document serves as a record of services personally performed by Gery Pray, MD. It was created on his behalf by Mary-Margaret Loma Messing, a trained medical scribe. The creation of this record is based on the scribe's personal observations and the provider's statements to them. This document has been checked and approved by the attending provider.

## 2018-07-13 NOTE — Anesthesia Preprocedure Evaluation (Signed)
Anesthesia Evaluation  Patient identified by MRN, date of birth, ID band Patient awake    Reviewed: Allergy & Precautions, NPO status , Patient's Chart, lab work & pertinent test results  History of Anesthesia Complications Negative for: history of anesthetic complications  Airway Mallampati: I  TM Distance: >3 FB Neck ROM: Full    Dental no notable dental hx. (+) Teeth Intact, Dental Advisory Given   Pulmonary neg pulmonary ROS,    Pulmonary exam normal        Cardiovascular negative cardio ROS Normal cardiovascular exam     Neuro/Psych  Headaches, PSYCHIATRIC DISORDERS    GI/Hepatic GERD  Medicated,Elevated liver enzymes IBS    Endo/Other  negative endocrine ROS  Renal/GU negative Renal ROS     Musculoskeletal negative musculoskeletal ROS (+)   Abdominal   Peds  Hematology  (+) Blood dyscrasia, anemia ,   Anesthesia Other Findings Day of surgery medications reviewed with the patient.  Reproductive/Obstetrics                             Lab Results  Component Value Date   WBC 3.4 (L) 07/09/2018   HGB 9.0 (L) 07/09/2018   HCT 27.6 (L) 07/09/2018   MCV 99.3 07/09/2018   PLT 284 07/09/2018   Lab Results  Component Value Date   CREATININE 0.79 06/29/2018   BUN 11 06/29/2018   NA 137 06/29/2018   K 4.5 06/29/2018   CL 104 06/29/2018   CO2 26 06/29/2018    Anesthesia Physical  Anesthesia Plan  ASA: II  Anesthesia Plan: General   Post-op Pain Management:    Induction: Intravenous  PONV Risk Score and Plan: 4 or greater and Ondansetron, Dexamethasone, Diphenhydramine, Scopolamine patch - Pre-op and Treatment may vary due to age or medical condition  Airway Management Planned: LMA  Additional Equipment:   Intra-op Plan:   Post-operative Plan: Extubation in OR  Informed Consent: I have reviewed the patients History and Physical, chart, labs and discussed the  procedure including the risks, benefits and alternatives for the proposed anesthesia with the patient or authorized representative who has indicated his/her understanding and acceptance.     Dental advisory given  Plan Discussed with: CRNA and Anesthesiologist  Anesthesia Plan Comments:         Anesthesia Quick Evaluation

## 2018-07-13 NOTE — Anesthesia Procedure Notes (Signed)
Procedure Name: LMA Insertion Date/Time: 07/13/2018 10:56 AM Performed by: Justice Rocher, CRNA Pre-anesthesia Checklist: Patient identified, Emergency Drugs available, Suction available and Patient being monitored Patient Re-evaluated:Patient Re-evaluated prior to induction Oxygen Delivery Method: Circle system utilized Preoxygenation: Pre-oxygenation with 100% oxygen Induction Type: IV induction Ventilation: Mask ventilation without difficulty LMA: LMA inserted LMA Size: 4.0 Number of attempts: 1 Airway Equipment and Method: Bite block Placement Confirmation: positive ETCO2 and breath sounds checked- equal and bilateral Tube secured with: Tape Dental Injury: Teeth and Oropharynx as per pre-operative assessment

## 2018-07-13 NOTE — Op Note (Signed)
07/13/2018  11:47 AM  PATIENT:  Sheena Torres  53 y.o. female  PRE-OPERATIVE DIAGNOSIS:  ENDO CERVIX  POST-OPERATIVE DIAGNOSIS:  ENDO CERVIX  PROCEDURE:  Procedure(s): TANDEM RING INSERTION (N/A) OPERATIVE ULTRASOUND (N/A)  SURGEON:  Surgeon(s) and Role:    * Gery Pray, MD - Primary  PHYSICIAN ASSISTANT:   ASSISTANTS: none   ANESTHESIA:   general  EBL:  0 mL   BLOOD ADMINISTERED:none  DRAINS: Urinary Catheter (Foley)   LOCAL MEDICATIONS USED:  NONE  SPECIMEN:  No Specimen  DISPOSITION OF SPECIMEN:  N/A  COUNTS:  YES  TOURNIQUET:  * No tourniquets in log *  DICTATION: Patient was taken to outpatient OR #3. Timeout performed for the procedure,estimated length of the procedure,preoperative medications. Patient was prepped and draped in the usual sterile fashion and placed in the dorsolithotomy position. A Foley catheter was placed without difficulty. The Foley catheter was backfilled with approximately 200 cc of sterile water for imaging purposes. Exam under anesthesia revealed excellent response to her external beam radiation and radiosensitizing chemotherapy. The cervix was estimated to be approximately 2 by 2.25 cm. There is some erythema to the anterior lip of thecervix but no visible lesion. On bimanual  examination there is no parametrial extension appreciated. Patient proceeded to undergo sounding of the uterus. The uterus was moderately anteverted and sounded to a length of 8.0 cm. Excellent intraoperative ultrasound images were obtained. Patient then proceeded to undergo serial dilation of the cervical os. She then had placement of a 60 mm cervical sleeve within the endometrial cavity and endocervical canal. Was then followed by placement of a 70mm, 45 degree tandem within the cervical sleeve. Intraoperative ultrasound showed good placement centrally within the uterus. Patient then had placement of a 45 degree ring with a large shielding In place.  This was then followed by placement of a rectal paddle posteriorly. Final intraoperative imaging showed good placement of the tandem ring apparatusfor the patient's third high-dose-rate radiation treatment. Patient was subsequently transported to the recovery room in stable condition. Later in day the patient will be transported to radiation oncology for planning and her third high-dose-rate treatment. Patient is to receive 5.5 Gy to the high risk clinical target volume. She will be treated with iridium 192 is the high-dose-rate source.  PLAN OF CARE: Transferred to radiation oncology for planning and treatment  PATIENT DISPOSITION:  PACU - hemodynamically stable.   Delay start of Pharmacological VTE agent (>24hrs) due to surgical blood loss or risk of bleeding: not applicable

## 2018-07-13 NOTE — Transfer of Care (Signed)
Immediate Anesthesia Transfer of Care Note  Patient: Sheena Torres  Procedure(s) Performed: Procedure(s) (LRB): TANDEM RING INSERTION (N/A) OPERATIVE ULTRASOUND (N/A)  Patient Location: PACU  Anesthesia Type: General  Level of Consciousness: awake, sedated, patient cooperative and responds to stimulation  Airway & Oxygen Therapy: Patient Spontanous Breathing and Patient connected to [place] O2 w/ soft Face mask  Post-op Assessment: Report given to PACU RN, Post -op Vital signs reviewed and stable and Patient moving all extremities  Post vital signs: Reviewed and stable  Complications: No apparent anesthesia complications

## 2018-07-14 ENCOUNTER — Inpatient Hospital Stay: Payer: Self-pay | Attending: Gynecologic Oncology

## 2018-07-14 ENCOUNTER — Other Ambulatory Visit: Payer: Self-pay | Admitting: Hematology and Oncology

## 2018-07-14 ENCOUNTER — Other Ambulatory Visit: Payer: Self-pay

## 2018-07-14 ENCOUNTER — Inpatient Hospital Stay: Payer: Self-pay

## 2018-07-14 ENCOUNTER — Encounter (HOSPITAL_BASED_OUTPATIENT_CLINIC_OR_DEPARTMENT_OTHER): Payer: Self-pay | Admitting: Radiation Oncology

## 2018-07-14 DIAGNOSIS — C530 Malignant neoplasm of endocervix: Secondary | ICD-10-CM | POA: Insufficient documentation

## 2018-07-14 LAB — CBC WITH DIFFERENTIAL/PLATELET
Abs Immature Granulocytes: 0.02 10*3/uL (ref 0.00–0.07)
Basophils Absolute: 0 10*3/uL (ref 0.0–0.1)
Basophils Relative: 0 %
Eosinophils Absolute: 0 10*3/uL (ref 0.0–0.5)
Eosinophils Relative: 0 %
HCT: 25.3 % — ABNORMAL LOW (ref 36.0–46.0)
Hemoglobin: 8.3 g/dL — ABNORMAL LOW (ref 12.0–15.0)
Immature Granulocytes: 0 %
Lymphocytes Relative: 11 %
Lymphs Abs: 0.5 10*3/uL — ABNORMAL LOW (ref 0.7–4.0)
MCH: 33.2 pg (ref 26.0–34.0)
MCHC: 32.8 g/dL (ref 30.0–36.0)
MCV: 101.2 fL — ABNORMAL HIGH (ref 80.0–100.0)
Monocytes Absolute: 0.5 10*3/uL (ref 0.1–1.0)
Monocytes Relative: 10 %
Neutro Abs: 3.5 10*3/uL (ref 1.7–7.7)
Neutrophils Relative %: 79 %
Platelets: 258 10*3/uL (ref 150–400)
RBC: 2.5 MIL/uL — ABNORMAL LOW (ref 3.87–5.11)
RDW: 19.4 % — ABNORMAL HIGH (ref 11.5–15.5)
WBC: 4.5 10*3/uL (ref 4.0–10.5)
nRBC: 0 % (ref 0.0–0.2)

## 2018-07-14 NOTE — Progress Notes (Signed)
Pt's ANC was 3.5 Pt. does not need granix injection today per Dr. Alvy Bimler. Pt's HGB is 8.3 she will return to [facility] for lab appointment on 07/24/18. Pt.informed, Pt. verbalized understanding.

## 2018-07-15 ENCOUNTER — Telehealth: Payer: Self-pay | Admitting: Hematology and Oncology

## 2018-07-15 NOTE — Anesthesia Postprocedure Evaluation (Signed)
Anesthesia Post Note  Patient: Sheena Torres  Procedure(s) Performed: TANDEM RING INSERTION (N/A Cervix) OPERATIVE ULTRASOUND (N/A )     Patient location during evaluation: PACU Anesthesia Type: General Level of consciousness: awake and alert Pain management: pain level controlled Vital Signs Assessment: post-procedure vital signs reviewed and stable Respiratory status: spontaneous breathing, nonlabored ventilation, respiratory function stable and patient connected to nasal cannula oxygen Cardiovascular status: blood pressure returned to baseline and stable Postop Assessment: no apparent nausea or vomiting Anesthetic complications: no    Last Vitals:  Vitals:   07/13/18 1230 07/13/18 1243  BP: 102/65   Pulse: 74 79  Resp: 16 13  Temp:    SpO2: 98% 97%    Last Pain:  Vitals:   07/14/18 1020  TempSrc:   PainSc: 0-No pain                 Tiajuana Amass

## 2018-07-15 NOTE — Telephone Encounter (Signed)
Scheduled appt per 6/2 sch message - pt is aware of appt date and time   

## 2018-07-16 ENCOUNTER — Encounter (HOSPITAL_BASED_OUTPATIENT_CLINIC_OR_DEPARTMENT_OTHER): Payer: Self-pay | Admitting: *Deleted

## 2018-07-16 ENCOUNTER — Other Ambulatory Visit: Payer: Self-pay

## 2018-07-16 NOTE — Telephone Encounter (Signed)
Spoke with son he will be here to pick her up. Brandt Loosen RN

## 2018-07-16 NOTE — Progress Notes (Signed)
Spoke with Sheena Torres after midnight, arrive 07-21-18 530 am [facility] Prn meds sip of water Has surgery orders in epic cbc with dif 07-14-18 epic Driver son Grayce Sessions [phone] or phillip [phone]

## 2018-07-17 ENCOUNTER — Telehealth: Payer: Self-pay | Admitting: Physician Assistant

## 2018-07-20 ENCOUNTER — Ambulatory Visit: Payer: Self-pay | Admitting: Radiation Oncology

## 2018-07-20 ENCOUNTER — Telehealth: Payer: Self-pay

## 2018-07-20 ENCOUNTER — Other Ambulatory Visit (HOSPITAL_COMMUNITY): Payer: Self-pay

## 2018-07-20 ENCOUNTER — Encounter (HOSPITAL_BASED_OUTPATIENT_CLINIC_OR_DEPARTMENT_OTHER): Payer: Self-pay | Admitting: Anesthesiology

## 2018-07-20 NOTE — Anesthesia Preprocedure Evaluation (Addendum)
Anesthesia Evaluation  Patient identified by MRN, date of birth, ID band Patient awake    Reviewed: Allergy & Precautions, NPO status , Patient's Chart, lab work & pertinent test results  Airway Mallampati: I  TM Distance: >3 FB Neck ROM: Full    Dental  (+) Teeth Intact, Dental Advisory Given   Pulmonary    breath sounds clear to auscultation       Cardiovascular negative cardio ROS   Rhythm:Regular Rate:Normal     Neuro/Psych  Headaches, negative psych ROS   GI/Hepatic negative GI ROS, Neg liver ROS,   Endo/Other  negative endocrine ROS  Renal/GU negative Renal ROS     Musculoskeletal negative musculoskeletal ROS (+)   Abdominal Normal abdominal exam  (+)   Peds  Hematology negative hematology ROS (+)   Anesthesia Other Findings   Reproductive/Obstetrics                            Anesthesia Physical Anesthesia Plan  ASA: II  Anesthesia Plan: General   Post-op Pain Management:    Induction: Intravenous  PONV Risk Score and Plan: 4 or greater and Ondansetron, Dexamethasone, Midazolam and Scopolamine patch - Pre-op  Airway Management Planned: LMA  Additional Equipment: None  Intra-op Plan:   Post-operative Plan: Extubation in OR  Informed Consent: I have reviewed the patients History and Physical, chart, labs and discussed the procedure including the risks, benefits and alternatives for the proposed anesthesia with the patient or authorized representative who has indicated his/her understanding and acceptance.     Dental advisory given  Plan Discussed with: CRNA  Anesthesia Plan Comments: (COVID-19 Labs  No results for input(s): DDIMER, FERRITIN, LDH, CRP in the last 72 hours.  Lab Results      Component                Value               Date                      SARSCOV2NAA              NEGATIVE            07/08/2018                SARSCOV2NAA              NOT  DETECTED        06/30/2018            )       Anesthesia Quick Evaluation

## 2018-07-20 NOTE — Progress Notes (Signed)
SPOKE W/ PATIENT.     SCREENING SYMPTOMS OF COVID 19:   COUGH-- NO  RUNNY NOSE--- NO  SORE THROAT--- NO  NASAL CONGESTION---- NO  SNEEZING---- NO  SHORTNESS OF BREATH--- NO  DIFFICULTY BREATHING--- NO  TEMP >100.0 ----- NO  UNEXPLAINED BODY ACHES------ NO  CHILLS -------- NO  HEADACHES --------- NO  LOSS OF SMELL/ TASTE -------- NO    HAVE YOU OR ANY FAMILY MEMBER TRAVELLED PAST 14 DAYS OUT OF THE   COUNTY--- NO STATE---- NO COUNTRY---- NO  HAVE YOU OR ANY FAMILY MEMBER BEEN EXPOSED TO ANYONE WITH COVID 19? NO  Dorrene German, RN

## 2018-07-20 NOTE — Telephone Encounter (Signed)
Double check with Santiago Glad She has asked this question before  It is my understanding the tandem radiation is a whole day procedure Unless Dr. Sondra Come is willing to order her blood test

## 2018-07-20 NOTE — Telephone Encounter (Signed)
Dr. Clabe Seal nurse, Sharee Pimple called and left a message that they could do the lab work tomorrow.  Called back and left a message. CBC with diff is the only lab work ordered.

## 2018-07-20 NOTE — Telephone Encounter (Signed)
She called and left a message. She needs to cancel Friday's lab appt. She is gong out of town Wednesday. She can come today for labs or tomorrow after procedure with Dr. Sondra Come.

## 2018-07-21 ENCOUNTER — Ambulatory Visit
Admission: RE | Admit: 2018-07-21 | Discharge: 2018-07-21 | Disposition: A | Discharge status: Home / Self Care | Payer: Self-pay | Source: Ambulatory Visit | Attending: Radiation Oncology | Admitting: Radiation Oncology

## 2018-07-21 ENCOUNTER — Other Ambulatory Visit: Payer: Self-pay

## 2018-07-21 ENCOUNTER — Encounter (HOSPITAL_BASED_OUTPATIENT_CLINIC_OR_DEPARTMENT_OTHER)
Admission: RE | Disposition: A | Discharge status: Other Acute Inpatient Hospital | Payer: Self-pay | Source: Home / Self Care | Attending: Radiation Oncology

## 2018-07-21 ENCOUNTER — Encounter (HOSPITAL_BASED_OUTPATIENT_CLINIC_OR_DEPARTMENT_OTHER): Payer: Self-pay

## 2018-07-21 ENCOUNTER — Ambulatory Visit (HOSPITAL_BASED_OUTPATIENT_CLINIC_OR_DEPARTMENT_OTHER): Payer: Self-pay | Admitting: Anesthesiology

## 2018-07-21 ENCOUNTER — Ambulatory Visit (HOSPITAL_COMMUNITY)
Admission: RE | Admit: 2018-07-21 | Discharge: 2018-07-21 | Disposition: A | Discharge status: Home / Self Care | Payer: Self-pay | Source: Ambulatory Visit | Attending: Radiation Oncology | Admitting: Radiation Oncology

## 2018-07-21 ENCOUNTER — Ambulatory Visit (HOSPITAL_BASED_OUTPATIENT_CLINIC_OR_DEPARTMENT_OTHER)
Admission: RE | Admit: 2018-07-21 | Discharge: 2018-07-21 | Disposition: A | Discharge status: Other Acute Inpatient Hospital | Payer: Self-pay | Attending: Radiation Oncology | Admitting: Radiation Oncology

## 2018-07-21 DIAGNOSIS — C530 Malignant neoplasm of endocervix: Secondary | ICD-10-CM

## 2018-07-21 DIAGNOSIS — Z923 Personal history of irradiation: Secondary | ICD-10-CM | POA: Insufficient documentation

## 2018-07-21 DIAGNOSIS — C539 Malignant neoplasm of cervix uteri, unspecified: Secondary | ICD-10-CM

## 2018-07-21 DIAGNOSIS — Z9221 Personal history of antineoplastic chemotherapy: Secondary | ICD-10-CM | POA: Insufficient documentation

## 2018-07-21 DIAGNOSIS — G43909 Migraine, unspecified, not intractable, without status migrainosus: Secondary | ICD-10-CM | POA: Insufficient documentation

## 2018-07-21 DIAGNOSIS — Z79899 Other long term (current) drug therapy: Secondary | ICD-10-CM | POA: Insufficient documentation

## 2018-07-21 HISTORY — PX: OPERATIVE ULTRASOUND: SHX5996

## 2018-07-21 HISTORY — PX: TANDEM RING INSERTION: SHX6199

## 2018-07-21 LAB — CBC WITH DIFFERENTIAL (CANCER CENTER ONLY)
Abs Immature Granulocytes: 0.01 10*3/uL (ref 0.00–0.07)
Basophils Absolute: 0 10*3/uL (ref 0.0–0.1)
Basophils Relative: 0 %
Eosinophils Absolute: 0 10*3/uL (ref 0.0–0.5)
Eosinophils Relative: 0 %
HCT: 31.5 % — ABNORMAL LOW (ref 36.0–46.0)
Hemoglobin: 10.3 g/dL — ABNORMAL LOW (ref 12.0–15.0)
Immature Granulocytes: 0 %
Lymphocytes Relative: 10 %
Lymphs Abs: 0.3 10*3/uL — ABNORMAL LOW (ref 0.7–4.0)
MCH: 33.8 pg (ref 26.0–34.0)
MCHC: 32.7 g/dL (ref 30.0–36.0)
MCV: 103.3 fL — ABNORMAL HIGH (ref 80.0–100.0)
Monocytes Absolute: 0.1 10*3/uL (ref 0.1–1.0)
Monocytes Relative: 2 %
Neutro Abs: 2.2 10*3/uL (ref 1.7–7.7)
Neutrophils Relative %: 88 %
Platelet Count: 219 10*3/uL (ref 150–400)
RBC: 3.05 MIL/uL — ABNORMAL LOW (ref 3.87–5.11)
WBC Count: 2.6 10*3/uL — ABNORMAL LOW (ref 4.0–10.5)
nRBC: 0 % (ref 0.0–0.2)

## 2018-07-21 SURGERY — INSERTION, UTERINE TANDEM AND RING OR CYLINDER, FOR BRACHYTHERAPY
Anesthesia: General

## 2018-07-21 MED ORDER — ACETAMINOPHEN 500 MG PO TABS
ORAL_TABLET | ORAL | Status: AC
  Filled 2018-07-21: qty 2

## 2018-07-21 MED ORDER — ONDANSETRON HCL 4 MG/2ML IJ SOLN
INTRAMUSCULAR | Status: AC
  Filled 2018-07-21: qty 2

## 2018-07-21 MED ORDER — PROPOFOL 10 MG/ML IV BOLUS
INTRAVENOUS | Status: AC
  Filled 2018-07-21: qty 40

## 2018-07-21 MED ORDER — ACETAMINOPHEN 325 MG PO TABS
ORAL_TABLET | ORAL | Status: DC | PRN
  Administered 2018-07-21: 1000 mg via ORAL

## 2018-07-21 MED ORDER — FENTANYL CITRATE (PF) 100 MCG/2ML IJ SOLN
25.0000 ug | INTRAMUSCULAR | Status: DC | PRN
  Administered 2018-07-21 (×2): 50 ug via INTRAVENOUS
  Filled 2018-07-21: qty 1

## 2018-07-21 MED ORDER — SODIUM CHLORIDE 0.9 % IR SOLN
Status: DC | PRN
  Administered 2018-07-21: 200 mL

## 2018-07-21 MED ORDER — LIDOCAINE 2% (20 MG/ML) 5 ML SYRINGE
INTRAMUSCULAR | Status: DC | PRN
  Administered 2018-07-21: 80 mg via INTRAVENOUS

## 2018-07-21 MED ORDER — KETOROLAC TROMETHAMINE 30 MG/ML IJ SOLN
INTRAMUSCULAR | Status: AC
  Filled 2018-07-21: qty 1

## 2018-07-21 MED ORDER — MEPERIDINE HCL 25 MG/ML IJ SOLN
6.2500 mg | INTRAMUSCULAR | Status: DC | PRN
  Filled 2018-07-21: qty 1

## 2018-07-21 MED ORDER — MIDAZOLAM HCL 5 MG/5ML IJ SOLN
INTRAMUSCULAR | Status: DC | PRN
  Administered 2018-07-21: 2 mg via INTRAVENOUS

## 2018-07-21 MED ORDER — ACETAMINOPHEN 10 MG/ML IV SOLN
1000.0000 mg | Freq: Once | INTRAVENOUS | Status: DC | PRN
  Filled 2018-07-21: qty 100

## 2018-07-21 MED ORDER — PROMETHAZINE HCL 25 MG/ML IJ SOLN
6.2500 mg | INTRAMUSCULAR | Status: DC | PRN
  Filled 2018-07-21: qty 1

## 2018-07-21 MED ORDER — LACTATED RINGERS IV SOLN
Freq: Once | INTRAVENOUS | Status: AC
  Administered 2018-07-21: 10:00:00 via INTRAVENOUS
  Filled 2018-07-21: qty 250

## 2018-07-21 MED ORDER — FENTANYL CITRATE (PF) 100 MCG/2ML IJ SOLN
INTRAMUSCULAR | Status: AC
  Filled 2018-07-21: qty 2

## 2018-07-21 MED ORDER — SCOPOLAMINE 1 MG/3DAYS TD PT72
MEDICATED_PATCH | TRANSDERMAL | Status: DC | PRN
  Administered 2018-07-21: 1 via TRANSDERMAL

## 2018-07-21 MED ORDER — SCOPOLAMINE 1 MG/3DAYS TD PT72
MEDICATED_PATCH | TRANSDERMAL | Status: AC
  Filled 2018-07-21: qty 1

## 2018-07-21 MED ORDER — HYDROMORPHONE HCL 1 MG/ML IJ SOLN
0.5000 mg | Freq: Once | INTRAMUSCULAR | Status: AC
  Administered 2018-07-21: 0.5 mg via INTRAVENOUS
  Filled 2018-07-21: qty 1

## 2018-07-21 MED ORDER — PHENYLEPHRINE 40 MCG/ML (10ML) SYRINGE FOR IV PUSH (FOR BLOOD PRESSURE SUPPORT)
PREFILLED_SYRINGE | INTRAVENOUS | Status: DC | PRN
  Administered 2018-07-21 (×2): 80 ug via INTRAVENOUS

## 2018-07-21 MED ORDER — LACTATED RINGERS IV SOLN
INTRAVENOUS | Status: DC
  Administered 2018-07-21: 07:00:00 via INTRAVENOUS
  Filled 2018-07-21: qty 1000

## 2018-07-21 MED ORDER — ACETAMINOPHEN 325 MG PO TABS
325.0000 mg | ORAL_TABLET | Freq: Once | ORAL | Status: DC | PRN
  Filled 2018-07-21: qty 2

## 2018-07-21 MED ORDER — PHENYLEPHRINE 40 MCG/ML (10ML) SYRINGE FOR IV PUSH (FOR BLOOD PRESSURE SUPPORT)
PREFILLED_SYRINGE | INTRAVENOUS | Status: AC
  Filled 2018-07-21: qty 10

## 2018-07-21 MED ORDER — ACETAMINOPHEN 160 MG/5ML PO SOLN
325.0000 mg | Freq: Once | ORAL | Status: DC | PRN
  Filled 2018-07-21: qty 20.3

## 2018-07-21 MED ORDER — OXYCODONE HCL 5 MG/5ML PO SOLN
5.0000 mg | Freq: Once | ORAL | Status: DC | PRN
  Filled 2018-07-21: qty 5

## 2018-07-21 MED ORDER — ONDANSETRON HCL 4 MG/2ML IJ SOLN
INTRAMUSCULAR | Status: DC | PRN
  Administered 2018-07-21: 4 mg via INTRAVENOUS

## 2018-07-21 MED ORDER — KETOROLAC TROMETHAMINE 30 MG/ML IJ SOLN
INTRAMUSCULAR | Status: DC | PRN
  Administered 2018-07-21: 30 mg via INTRAVENOUS

## 2018-07-21 MED ORDER — DEXAMETHASONE SODIUM PHOSPHATE 10 MG/ML IJ SOLN
INTRAMUSCULAR | Status: DC | PRN
  Administered 2018-07-21: 10 mg via INTRAVENOUS

## 2018-07-21 MED ORDER — FENTANYL CITRATE (PF) 100 MCG/2ML IJ SOLN
INTRAMUSCULAR | Status: DC | PRN
  Administered 2018-07-21: 25 ug via INTRAVENOUS

## 2018-07-21 MED ORDER — LIDOCAINE 2% (20 MG/ML) 5 ML SYRINGE
INTRAMUSCULAR | Status: AC
  Filled 2018-07-21: qty 5

## 2018-07-21 MED ORDER — MIDAZOLAM HCL 2 MG/2ML IJ SOLN
INTRAMUSCULAR | Status: AC
  Filled 2018-07-21: qty 2

## 2018-07-21 MED ORDER — PROPOFOL 10 MG/ML IV BOLUS
INTRAVENOUS | Status: DC | PRN
  Administered 2018-07-21: 150 mg via INTRAVENOUS

## 2018-07-21 MED ORDER — OXYCODONE HCL 5 MG PO TABS
5.0000 mg | ORAL_TABLET | Freq: Once | ORAL | Status: DC | PRN
  Filled 2018-07-21: qty 1

## 2018-07-21 MED ORDER — LACTATED RINGERS IV SOLN
INTRAVENOUS | Status: DC
  Administered 2018-07-21: 09:00:00 via INTRAVENOUS
  Filled 2018-07-21: qty 1000

## 2018-07-21 SURGICAL SUPPLY — 21 items
BNDG CONFORM 2 STRL LF (GAUZE/BANDAGES/DRESSINGS) IMPLANT
COVER WAND RF STERILE (DRAPES) ×3 IMPLANT
DILATOR CANAL MILEX (MISCELLANEOUS) IMPLANT
GAUZE 4X4 16PLY RFD (DISPOSABLE) ×3 IMPLANT
GLOVE BIO SURGEON STRL SZ7.5 (GLOVE) ×9 IMPLANT
GLOVE BIOGEL PI IND STRL 7.5 (GLOVE) ×1 IMPLANT
GLOVE BIOGEL PI INDICATOR 7.5 (GLOVE) ×2
GOWN STRL REUS W/TWL LRG LVL3 (GOWN DISPOSABLE) ×6 IMPLANT
HOLDER FOLEY CATH W/STRAP (MISCELLANEOUS) ×3 IMPLANT
HOVERMATT SINGLE USE (MISCELLANEOUS) ×3 IMPLANT
IV NS 1000ML (IV SOLUTION) ×2
IV NS 1000ML BAXH (IV SOLUTION) ×1 IMPLANT
IV SET EXTENSION GRAVITY 40 LF (IV SETS) ×3 IMPLANT
KIT TURNOVER CYSTO (KITS) ×3 IMPLANT
PACK VAGINAL MINOR WOMEN LF (CUSTOM PROCEDURE TRAY) ×3 IMPLANT
PACKING VAGINAL (PACKING) IMPLANT
PAD ABD 8X10 STRL (GAUZE/BANDAGES/DRESSINGS) ×3 IMPLANT
PAD OB MATERNITY 4.3X12.25 (PERSONAL CARE ITEMS) IMPLANT
TOWEL OR 17X26 10 PK STRL BLUE (TOWEL DISPOSABLE) ×3 IMPLANT
TRAY FOLEY W/BAG SLVR 14FR (SET/KITS/TRAYS/PACK) ×3 IMPLANT
WATER STERILE IRR 500ML POUR (IV SOLUTION) ×3 IMPLANT

## 2018-07-21 NOTE — Anesthesia Postprocedure Evaluation (Signed)
Anesthesia Post Note  Patient: Sheena Torres  Procedure(s) Performed: TANDEM RING INSERTION (N/A ) OPERATIVE ULTRASOUND (N/A )     Patient location during evaluation: PACU Anesthesia Type: General Level of consciousness: awake and alert Pain management: pain level controlled Vital Signs Assessment: post-procedure vital signs reviewed and stable Respiratory status: spontaneous breathing, nonlabored ventilation, respiratory function stable and patient connected to nasal cannula oxygen Cardiovascular status: blood pressure returned to baseline and stable Postop Assessment: no apparent nausea or vomiting Anesthetic complications: no    Last Vitals:  Vitals:   07/21/18 0900 07/21/18 0915  BP: 110/69   Pulse: 92 89  Resp: 12 19  Temp:    SpO2: 98% 95%    Last Pain:  Vitals:   07/21/18 0913  TempSrc:   PainSc: [place] Mahlon Gabrielle

## 2018-07-21 NOTE — Progress Notes (Signed)
  Radiation Oncology         [phone] ________________________________  Name: Sheena Torres MRN: [id number]  Date: 07/21/2018  DOB: 08-16-1965  CC: [name], MD  HDR BRACHYTHERAPY NOTE  DIAGNOSIS: Cervical cancer  NARRATIVE: The patient was brought to the [place] suite. Identity was confirmed. All relevant records and images related to the planned course of therapy were reviewed. The patient freely provided informed written consent to proceed with treatment after reviewing the details related to the planned course of therapy. The consent form was witnessed and verified by the simulation staff. Then, the patient was set-up in a stable reproducible supine position for radiation therapy. The tandem ring system was accessed and fiducial markers were placed within the tandem and ring.   Simple treatment device note: On the operating room the patient had construction of her custom tandem ring system. She will be treated with a 45 tandem/ring system. The patient had placement of a 60 mm tandem. A cervical ring with a large shielding was used for her treatment. A rectal paddle was also part of her custom set up device.  Verification simulation note: An AP and lateral film was obtained through the pelvis area. This was compared to the patient's planning films documenting accurate position of the tandem/ring system for treatment.  High-dose-rate brachytherapy treatment note:  The remote afterloading device was accessed through catheter system and attached to the tandem ring system. Patient then proceeded to undergo her fourth high-dose-rate treatment directed at the cervix. The patient was prescribed a dose of 5.5 gray to be delivered to the [place].Marland Kitchen Patient was treated with 2 channels using 26 dwell positions. Treatment time was 580.7 seconds. The patient tolerated the procedure well. After completion of her therapy, a radiation survey was performed documenting return of the  iridium source into the [employer] safe. The patient was then transferred to the nursing suite. She then had removal of the rectal paddle followed by the tandem and ring system. The patient tolerated the removal well.  PLAN: She will return next week for her fifth and final high-dose-rate treatment which will  complete her definitive course of radiation therapy. ________________________________  Blair Promise, PhD, MD

## 2018-07-21 NOTE — Transfer of Care (Signed)
Immediate Anesthesia Transfer of Care Note  Patient: Sheena Torres  Procedure(s) Performed: TANDEM RING INSERTION (N/A ) OPERATIVE ULTRASOUND (N/A )  Patient Location: PACU  Anesthesia Type:General  Level of Consciousness: drowsy  Airway & Oxygen Therapy: Patient Spontanous Breathing and Patient connected to nasal cannula oxygen  Post-op Assessment: Report given to RN  Post vital signs: Reviewed and stable  Last Vitals:  Vitals Value Taken Time  BP 111/60 07/21/2018  8:19 AM  Temp    Pulse 84 07/21/2018  8:20 AM  Resp 8 07/21/2018  8:20 AM  SpO2 100 % 07/21/2018  8:20 AM  Vitals shown include unvalidated device data.  Last Pain:  Vitals:   07/21/18 0554  TempSrc: Oral  PainSc: 0-No pain      Patients Stated Pain Goal: 4 (05/15/57 1368)  Complications: No apparent anesthesia complications

## 2018-07-21 NOTE — Interval H&P Note (Signed)
History and Physical Interval Note:  07/21/2018 7:24 AM  Sheena Torres  has presented today for surgery, with the diagnosis of ENDO CERVIX.  The various methods of treatment have been discussed with the patient and family. After consideration of risks, benefits and other options for treatment, the patient has consented to  Procedure(s): TANDEM RING INSERTION (N/A) OPERATIVE ULTRASOUND (N/A) as a surgical intervention.  The patient's history has been reviewed, patient examined, no change in status, stable for surgery.  I have reviewed the patient's chart and labs.  Questions were answered to the patient's satisfaction.     Gery Pray

## 2018-07-21 NOTE — Anesthesia Procedure Notes (Signed)
Procedure Name: LMA Insertion Date/Time: 07/21/2018 7:36 AM Performed by: Bonney Aid, CRNA Pre-anesthesia Checklist: Patient identified, Emergency Drugs available, Suction available and Patient being monitored Patient Re-evaluated:Patient Re-evaluated prior to induction Oxygen Delivery Method: Circle system utilized Preoxygenation: Pre-oxygenation with 100% oxygen Induction Type: IV induction Ventilation: Mask ventilation without difficulty LMA: LMA inserted LMA Size: 4.0 Number of attempts: 1 Airway Equipment and Method: Bite block Placement Confirmation: positive ETCO2 Tube secured with: Tape Dental Injury: Teeth and Oropharynx as per pre-operative assessment

## 2018-07-21 NOTE — Op Note (Signed)
07/21/2018  8:40 AM  PATIENT:  Sheena Torres  53 y.o. female  PRE-OPERATIVE DIAGNOSIS:  ENDO CERVIX  POST-OPERATIVE DIAGNOSIS:  ENDO CERVIX  PROCEDURE:  Procedure(s): TANDEM RING INSERTION (N/A) OPERATIVE ULTRASOUND (N/A)  SURGEON:  Surgeon(s) and Role:    * Gery Pray, MD - Primary  PHYSICIAN ASSISTANT:   ASSISTANTS: none   ANESTHESIA:   general  EBL:  0 mL   BLOOD ADMINISTERED:none  DRAINS: Urinary Catheter (Foley)   LOCAL MEDICATIONS USED:  NONE  SPECIMEN:  No Specimen  DISPOSITION OF SPECIMEN:  N/A  COUNTS:  YES  TOURNIQUET:  * No tourniquets in log *  DICTATION: Patient was taken to outpatient OR #5. Timeout performed for the procedure,estimated length of the procedure,preoperative medications. Patient was prepped and draped in the usual sterile fashion and placed in the dorsolithotomy position. A Foley catheter was placed without difficulty. The Foley catheter was backfilled with approximately 200 cc of sterile water for imaging purposes. Exam under anesthesia revealed excellent response to her external beam radiation and radiosensitizing chemotherapy. The cervix was estimated to be approximately 2 by 2.25 cm. There is some erythema to theanterior and posteriorlip of thecervix but no visible lesion. On bimanual examination there is no parametrial extension appreciated. Patient proceeded to undergo sounding of the uterus. The uterus was moderately anteverted and sounded to a length of 8.0cm. Excellent intraoperative ultrasound images were obtained. Patient then proceeded to undergo serial dilation of the cervical os. She then had placement of a 60 mm cervical sleeve within the endometrial cavity and endocervical canal. Was then followed by placement of a 46mm, 45 degree tandem within the cervical sleeve. Intraoperative ultrasound showed good placement centrally within the uterus. Patient then had placement of a 45 degree ring with alargeshielding  In place. This was then followed by placement of a rectal paddle posteriorly. Final intraoperative imaging showed good placement of the tandem ring apparatusfor the patient's upcominghigh-dose-rate radiation treatment. Patient was subsequently transported to the recovery room in stable condition. Later indaythe patient will be transported to radiation oncology for planning and herfourthhigh-dose-rate treatment. Patient is to receive 5.5 Gy to the high risk clinical target volume. She will be treated with iridium 192 is the high-dose-rate source.  PLAN OF CARE: Transferred to radiation oncology for planning and treatment  PATIENT DISPOSITION:  PACU - hemodynamically stable.   Delay start of Pharmacological VTE agent (>24hrs) due to surgical blood loss or risk of bleeding: not applicable

## 2018-07-21 NOTE — Progress Notes (Signed)
  Radiation Oncology         [phone] ________________________________  Name: Sheena Torres MRN: [id number]  Date: 07/21/2018  DOB: Jul 23, 1965  SIMULATION AND TREATMENT PLANNING NOTE HDR BRACHYTHERAPY  DIAGNOSIS: Cervical cancer  NARRATIVE:  The patient was brought to the [place] suite.  Identity was confirmed.  All relevant records and images related to the planned course of therapy were reviewed.  The patient freely provided informed written consent to proceed with treatment after reviewing the details related to the planned course of therapy. The consent form was witnessed and verified by the simulation staff.  Then, the patient was set-up in a stable reproducible  supine position for radiation therapy.  CT images were obtained.  Surface markings were placed.  The CT images were loaded into the planning software.  Then the target and avoidance structures were contoured.  Treatment planning then occurred.  The radiation prescription was entered and confirmed.   I have requested : Brachytherapy Isodose Plan and Dosimetry Calculations to plan the radiation distribution.    PLAN:  The patient will receive 5.5 Gy in 1 fraction directed at the high risk clinical target volume.  The patient will be treated with the tandem and ring system using iridium 192 is the high-dose-rate source.    ________________________________  Blair Promise, PhD, MD

## 2018-07-22 ENCOUNTER — Other Ambulatory Visit: Payer: Self-pay

## 2018-07-22 ENCOUNTER — Encounter (HOSPITAL_BASED_OUTPATIENT_CLINIC_OR_DEPARTMENT_OTHER): Payer: Self-pay | Admitting: Radiation Oncology

## 2018-07-22 NOTE — Progress Notes (Signed)
Spoke with:  Sheena Torres NPO:  After Midnight, no gum, candy, or mints   Arrival time: 0530AM Labs: No orders in epic AM medications:  PRN meds if needed with sip of water Pre op orders: Spoke with [place] [facility]:  Sheena Torres (son) [phone]

## 2018-07-23 ENCOUNTER — Telehealth: Payer: Self-pay

## 2018-07-23 ENCOUNTER — Other Ambulatory Visit: Payer: Self-pay | Admitting: Hematology and Oncology

## 2018-07-23 DIAGNOSIS — C530 Malignant neoplasm of endocervix: Secondary | ICD-10-CM

## 2018-07-23 NOTE — Telephone Encounter (Signed)
-----   Message from Heath Lark, MD sent at 07/23/2018  9:19 AM EDT ----- Regarding: labs and appt Pls let her know her recent CBC is better No need repeat I have placed orders for port flush every 8 weeks and PET CT in 3 months I will schedule future appt once her PET is scheduled

## 2018-07-23 NOTE — Telephone Encounter (Signed)
Called and given below message. She verbalized understanding. 

## 2018-07-24 ENCOUNTER — Other Ambulatory Visit: Payer: Self-pay

## 2018-07-24 ENCOUNTER — Telehealth: Payer: Self-pay | Admitting: Hematology and Oncology

## 2018-07-24 NOTE — Progress Notes (Signed)
SPOKE W/  PATIENT     SCREENING SYMPTOMS OF COVID 19:   COUGH-- NO  RUNNY NOSE--- NO   SORE THROAT--- NO  NASAL CONGESTION---- NO  SNEEZING---- NO  SHORTNESS OF BREATH--- NO  DIFFICULTY BREATHING--- NO  TEMP >100.0 ----- NO  UNEXPLAINED BODY ACHES------ NO  CHILLS -------- NO  HEADACHES --------- NO  LOSS OF SMELL/ TASTE -------- NO    HAVE YOU OR ANY FAMILY MEMBER TRAVELLED PAST 14 DAYS OUT OF THE   COUNTY--- NO STATE---- NO COUNTRY---- NO  HAVE YOU OR ANY FAMILY MEMBER BEEN EXPOSED TO ANYONE WITH COVID 19? NO  Dorrene German, RN

## 2018-07-24 NOTE — Telephone Encounter (Signed)
I talk with patient regarding schedule  

## 2018-07-26 NOTE — Progress Notes (Signed)
  Radiation Oncology         [phone] ________________________________  Name: Sheena Torres MRN: [id number]  Date: 07/27/2018  DOB: 1965-10-22  SIMULATION AND TREATMENT PLANNING NOTE HDR BRACHYTHERAPY  DIAGNOSIS: Cervical cancer  NARRATIVE:  The patient was brought to the [place] suite.  Identity was confirmed.  All relevant records and images related to the planned course of therapy were reviewed.  The patient freely provided informed written consent to proceed with treatment after reviewing the details related to the planned course of therapy. The consent form was witnessed and verified by the simulation staff.  Then, the patient was set-up in a stable reproducible  supine position for radiation therapy.  CT images were obtained.  Surface markings were placed.  The CT images were loaded into the planning software.  Then the target and avoidance structures were contoured.  Treatment planning then occurred.  The radiation prescription was entered and confirmed.   I have requested : Brachytherapy Isodose Plan and Dosimetry Calculations to plan the radiation distribution.    PLAN:  The patient will receive 5.5 Gy in 1 fraction directed at the high risk clinical target volume.  The patient will be treated with the tandem and ring system using iridium 192 is the high-dose-rate source.  ________________________________  Blair Promise, PhD, MD   This document serves as a record of services personally performed by Gery Pray, MD. It was created on his behalf by Wilburn Mylar, a trained medical scribe. The creation of this record is based on the scribe's personal observations and the provider's statements to them. This document has been checked and approved by the attending provider.

## 2018-07-26 NOTE — Progress Notes (Signed)
  Radiation Oncology         [phone]) [phone] ________________________________  Name: Sheena Torres MRN: [id number]  Date: 07/27/2018  DOB: 19-Nov-1965  CC: [name], MD  HDR BRACHYTHERAPY NOTE  DIAGNOSIS: Cervical cancer  NARRATIVE: The patient was brought to the [place] suite. Identity was confirmed. All relevant records and images related to the planned course of therapy were reviewed. The patient freely provided informed written consent to proceed with treatment after reviewing the details related to the planned course of therapy. The consent form was witnessed and verified by the simulation staff. Then, the patient was set-up in a stable reproducible supine position for radiation therapy. The tandem ring system was accessed and fiducial markers were placed within the tandem and ring.   Simple treatment device note: On the operating room the patient had construction of her custom tandem ring system. She will be treated with a 45 tandem/ring system. The patient had placement of a 60 mm tandem. A cervical ring with a large shielding was used for her treatment. A rectal paddle was also part of her custom set up device.  Verification simulation note: An AP and lateral film was obtained through the pelvis area. This was compared to the patient's planning films documenting accurate position of the tandem/ring system for treatment.  High-dose-rate brachytherapy treatment note:  The remote afterloading device was accessed through catheter system and attached to the tandem ring system. Patient then proceeded to undergo her fifth high-dose-rate treatment directed at the cervix. The patient was prescribed a dose of 5.5 gray to be delivered to the [place]. Patient was treated with 2 channels using 26 dwell positions. Treatment time was 648.4 seconds. The patient tolerated the procedure well. After completion of her therapy, a radiation survey was performed documenting return of the  iridium source into the [employer] safe. The patient was then transferred to the nursing suite. She then had removal of the rectal paddle followed by the tandem and ring system. The patient tolerated the removal well.  PLAN: She will return for follow up in 1 month.  She will be given a vaginal dilator at that time.  Patient will undergo a PET scan in approximately 3 months follow-up with Dr. Denman George soon afterward ________________________________  Blair Promise, PhD, MD  This document serves as a record of services personally performed by Gery Pray, MD. It was created on his behalf by Wilburn Mylar, a trained medical scribe. The creation of this record is based on the scribe's personal observations and the provider's statements to them. This document has been checked and approved by the attending provider.

## 2018-07-27 ENCOUNTER — Ambulatory Visit
Admission: RE | Admit: 2018-07-27 | Discharge: 2018-07-27 | Disposition: A | Discharge status: Home / Self Care | Payer: Self-pay | Source: Ambulatory Visit | Attending: Radiation Oncology | Admitting: Radiation Oncology

## 2018-07-27 ENCOUNTER — Ambulatory Visit (HOSPITAL_COMMUNITY)
Admission: RE | Admit: 2018-07-27 | Discharge: 2018-07-27 | Disposition: A | Discharge status: Home / Self Care | Payer: Self-pay | Source: Ambulatory Visit | Attending: Radiation Oncology | Admitting: Radiation Oncology

## 2018-07-27 ENCOUNTER — Ambulatory Visit (HOSPITAL_BASED_OUTPATIENT_CLINIC_OR_DEPARTMENT_OTHER)
Admission: RE | Admit: 2018-07-27 | Discharge: 2018-07-27 | Disposition: A | Discharge status: Other Acute Inpatient Hospital | Payer: Self-pay | Attending: Radiation Oncology | Admitting: Radiation Oncology

## 2018-07-27 ENCOUNTER — Other Ambulatory Visit: Payer: Self-pay

## 2018-07-27 ENCOUNTER — Encounter (HOSPITAL_BASED_OUTPATIENT_CLINIC_OR_DEPARTMENT_OTHER)
Admission: RE | Disposition: A | Discharge status: Other Acute Inpatient Hospital | Payer: Self-pay | Source: Home / Self Care | Attending: Radiation Oncology

## 2018-07-27 ENCOUNTER — Encounter (HOSPITAL_BASED_OUTPATIENT_CLINIC_OR_DEPARTMENT_OTHER): Payer: Self-pay | Admitting: *Deleted

## 2018-07-27 ENCOUNTER — Ambulatory Visit (HOSPITAL_BASED_OUTPATIENT_CLINIC_OR_DEPARTMENT_OTHER): Payer: Self-pay | Admitting: Anesthesiology

## 2018-07-27 DIAGNOSIS — Z9221 Personal history of antineoplastic chemotherapy: Secondary | ICD-10-CM | POA: Insufficient documentation

## 2018-07-27 DIAGNOSIS — C530 Malignant neoplasm of endocervix: Secondary | ICD-10-CM

## 2018-07-27 DIAGNOSIS — C539 Malignant neoplasm of cervix uteri, unspecified: Secondary | ICD-10-CM

## 2018-07-27 DIAGNOSIS — Z923 Personal history of irradiation: Secondary | ICD-10-CM | POA: Insufficient documentation

## 2018-07-27 DIAGNOSIS — Z79899 Other long term (current) drug therapy: Secondary | ICD-10-CM | POA: Insufficient documentation

## 2018-07-27 HISTORY — PX: OPERATIVE ULTRASOUND: SHX5996

## 2018-07-27 HISTORY — PX: TANDEM RING INSERTION: SHX6199

## 2018-07-27 SURGERY — INSERTION, UTERINE TANDEM AND RING OR CYLINDER, FOR BRACHYTHERAPY
Anesthesia: General | Site: Cervix

## 2018-07-27 MED ORDER — LACTATED RINGERS IV SOLN
Freq: Once | INTRAVENOUS | Status: AC
  Administered 2018-07-27: 10:00:00 via INTRAVENOUS
  Filled 2018-07-27: qty 250

## 2018-07-27 MED ORDER — FENTANYL CITRATE (PF) 100 MCG/2ML IJ SOLN
INTRAMUSCULAR | Status: DC | PRN
  Administered 2018-07-27: 50 ug via INTRAVENOUS

## 2018-07-27 MED ORDER — ACETAMINOPHEN 10 MG/ML IV SOLN
INTRAVENOUS | Status: DC | PRN
  Administered 2018-07-27: 1000 mg via INTRAVENOUS

## 2018-07-27 MED ORDER — PHENYLEPHRINE 40 MCG/ML (10ML) SYRINGE FOR IV PUSH (FOR BLOOD PRESSURE SUPPORT)
PREFILLED_SYRINGE | INTRAVENOUS | Status: DC | PRN
  Administered 2018-07-27: 120 ug via INTRAVENOUS
  Administered 2018-07-27: 80 ug via INTRAVENOUS

## 2018-07-27 MED ORDER — FENTANYL CITRATE (PF) 100 MCG/2ML IJ SOLN
25.0000 ug | INTRAMUSCULAR | Status: DC | PRN
  Administered 2018-07-27: 25 ug via INTRAVENOUS
  Administered 2018-07-27: 50 ug via INTRAVENOUS
  Filled 2018-07-27: qty 1

## 2018-07-27 MED ORDER — MIDAZOLAM HCL 5 MG/5ML IJ SOLN
INTRAMUSCULAR | Status: DC | PRN
  Administered 2018-07-27: 2 mg via INTRAVENOUS

## 2018-07-27 MED ORDER — FENTANYL CITRATE (PF) 100 MCG/2ML IJ SOLN
INTRAMUSCULAR | Status: AC
  Filled 2018-07-27: qty 2

## 2018-07-27 MED ORDER — KETOROLAC TROMETHAMINE 30 MG/ML IJ SOLN
INTRAMUSCULAR | Status: DC | PRN
  Administered 2018-07-27: 30 mg via INTRAVENOUS

## 2018-07-27 MED ORDER — SCOPOLAMINE 1 MG/3DAYS TD PT72
MEDICATED_PATCH | TRANSDERMAL | Status: DC | PRN
  Administered 2018-07-27: 1 via TRANSDERMAL

## 2018-07-27 MED ORDER — DEXAMETHASONE SODIUM PHOSPHATE 4 MG/ML IJ SOLN
INTRAMUSCULAR | Status: DC | PRN
  Administered 2018-07-27: 10 mg via INTRAVENOUS

## 2018-07-27 MED ORDER — PROPOFOL 10 MG/ML IV BOLUS
INTRAVENOUS | Status: AC
  Filled 2018-07-27: qty 40

## 2018-07-27 MED ORDER — OXYCODONE HCL 5 MG PO TABS
5.0000 mg | ORAL_TABLET | Freq: Once | ORAL | Status: DC | PRN
  Filled 2018-07-27: qty 1

## 2018-07-27 MED ORDER — ACETAMINOPHEN 10 MG/ML IV SOLN
INTRAVENOUS | Status: AC
  Filled 2018-07-27: qty 100

## 2018-07-27 MED ORDER — LACTATED RINGERS IV SOLN
INTRAVENOUS | Status: DC
  Administered 2018-07-27: 06:00:00 via INTRAVENOUS
  Filled 2018-07-27: qty 1000

## 2018-07-27 MED ORDER — PROPOFOL 10 MG/ML IV BOLUS
INTRAVENOUS | Status: DC | PRN
  Administered 2018-07-27: 150 mg via INTRAVENOUS

## 2018-07-27 MED ORDER — ONDANSETRON HCL 4 MG/2ML IJ SOLN
4.0000 mg | Freq: Four times a day (QID) | INTRAMUSCULAR | Status: DC | PRN
  Filled 2018-07-27: qty 2

## 2018-07-27 MED ORDER — HYDROMORPHONE HCL 1 MG/ML IJ SOLN
0.5000 mg | Freq: Once | INTRAMUSCULAR | Status: AC
  Administered 2018-07-27: 0.5 mg via INTRAVENOUS
  Filled 2018-07-27: qty 1

## 2018-07-27 MED ORDER — ONDANSETRON HCL 4 MG/2ML IJ SOLN
INTRAMUSCULAR | Status: DC | PRN
  Administered 2018-07-27: 4 mg via INTRAVENOUS

## 2018-07-27 MED ORDER — PHENYLEPHRINE 40 MCG/ML (10ML) SYRINGE FOR IV PUSH (FOR BLOOD PRESSURE SUPPORT)
PREFILLED_SYRINGE | INTRAVENOUS | Status: AC
  Filled 2018-07-27: qty 10

## 2018-07-27 MED ORDER — OXYCODONE HCL 5 MG/5ML PO SOLN
5.0000 mg | Freq: Once | ORAL | Status: DC | PRN
  Filled 2018-07-27: qty 5

## 2018-07-27 MED ORDER — MIDAZOLAM HCL 2 MG/2ML IJ SOLN
INTRAMUSCULAR | Status: AC
  Filled 2018-07-27: qty 2

## 2018-07-27 MED ORDER — LIDOCAINE 2% (20 MG/ML) 5 ML SYRINGE
INTRAMUSCULAR | Status: DC | PRN
  Administered 2018-07-27: 70 mg via INTRAVENOUS

## 2018-07-27 MED ORDER — SODIUM CHLORIDE 0.9 % IR SOLN
Status: DC | PRN
  Administered 2018-07-27: 1000 mL via INTRAVESICAL

## 2018-07-27 MED ORDER — HYDROMORPHONE HCL 1 MG/ML IJ SOLN
0.5000 mg | Freq: Once | INTRAMUSCULAR | Status: AC
  Administered 2018-07-27: 13:00:00 0.5 mg via INTRAVENOUS
  Filled 2018-07-27: qty 1

## 2018-07-27 SURGICAL SUPPLY — 25 items
BNDG CONFORM 2 STRL LF (GAUZE/BANDAGES/DRESSINGS) IMPLANT
COVER WAND RF STERILE (DRAPES) ×3 IMPLANT
DILATOR CANAL MILEX (MISCELLANEOUS) IMPLANT
GAUZE 4X4 16PLY RFD (DISPOSABLE) ×3 IMPLANT
GLOVE BIO SURGEON STRL SZ7.5 (GLOVE) ×6 IMPLANT
GLOVE BIO SURGEON STRL SZ8 (GLOVE) ×3 IMPLANT
GLOVE BIOGEL PI IND STRL 7.5 (GLOVE) ×1 IMPLANT
GLOVE BIOGEL PI IND STRL 8.5 (GLOVE) ×2 IMPLANT
GLOVE BIOGEL PI INDICATOR 7.5 (GLOVE) ×2
GLOVE BIOGEL PI INDICATOR 8.5 (GLOVE) ×4
GOWN STRL REUS W/TWL LRG LVL3 (GOWN DISPOSABLE) ×3 IMPLANT
GOWN STRL REUS W/TWL XL LVL3 (GOWN DISPOSABLE) ×3 IMPLANT
HOLDER FOLEY CATH W/STRAP (MISCELLANEOUS) ×3 IMPLANT
HOVERMATT SINGLE USE (MISCELLANEOUS) ×3 IMPLANT
IV NS 1000ML (IV SOLUTION) ×2
IV NS 1000ML BAXH (IV SOLUTION) ×1 IMPLANT
IV SET EXTENSION GRAVITY 40 LF (IV SETS) ×3 IMPLANT
KIT TURNOVER CYSTO (KITS) ×3 IMPLANT
PACK VAGINAL MINOR WOMEN LF (CUSTOM PROCEDURE TRAY) ×3 IMPLANT
PACKING VAGINAL (PACKING) IMPLANT
PAD ABD 8X10 STRL (GAUZE/BANDAGES/DRESSINGS) ×3 IMPLANT
PAD OB MATERNITY 4.3X12.25 (PERSONAL CARE ITEMS) ×3 IMPLANT
TOWEL OR 17X26 10 PK STRL BLUE (TOWEL DISPOSABLE) ×3 IMPLANT
TRAY FOLEY W/BAG SLVR 14FR (SET/KITS/TRAYS/PACK) ×3 IMPLANT
WATER STERILE IRR 500ML POUR (IV SOLUTION) ×3 IMPLANT

## 2018-07-27 NOTE — Transfer of Care (Signed)
  Last Vitals:  Vitals Value Taken Time  BP 123/78 07/27/18 0812  Temp 36.3 C 07/27/18 0812  Pulse 81 07/27/18 0814  Resp 11 07/27/18 0814  SpO2 100 % 07/27/18 0814  Vitals shown include unvalidated device data.  Last Pain:  Vitals:   07/27/18 0812  TempSrc:   PainSc: (P) 0-No pain      Immediate Anesthesia Transfer of Care Note  Patient: Sheena Torres  Procedure(s) Performed: Procedure(s) (LRB): TANDEM RING INSERTION (N/A) OPERATIVE ULTRASOUND (N/A)  Patient Location: PACU  Anesthesia Type: General  Level of Consciousness: awake, alert  and oriented  Airway & Oxygen Therapy: Patient Spontanous Breathing and Patient connected to nasal cannula oxygen  Post-op Assessment: Report given to PACU RN and Post -op Vital signs reviewed and stable  Post vital signs: Reviewed and stable  Complications: No apparent anesthesia complications

## 2018-07-27 NOTE — Anesthesia Preprocedure Evaluation (Signed)
Anesthesia Evaluation  Patient identified by MRN, date of birth, ID band Patient awake    Reviewed: Allergy & Precautions, H&P , NPO status , Patient's Chart, lab work & pertinent test results  Airway Mallampati: II   Neck ROM: full    Dental   Pulmonary neg pulmonary ROS,    breath sounds clear to auscultation       Cardiovascular negative cardio ROS   Rhythm:regular Rate:Normal     Neuro/Psych  Headaches,    GI/Hepatic   Endo/Other    Renal/GU      Musculoskeletal   Abdominal   Peds  Hematology   Anesthesia Other Findings   Reproductive/Obstetrics                             Anesthesia Physical Anesthesia Plan  ASA: II  Anesthesia Plan: General   Post-op Pain Management:    Induction: Intravenous  PONV Risk Score and Plan: 3 and Ondansetron, Dexamethasone, Midazolam and Treatment may vary due to age or medical condition  Airway Management Planned: Oral ETT  Additional Equipment:   Intra-op Plan:   Post-operative Plan: Extubation in OR  Informed Consent: I have reviewed the patients History and Physical, chart, labs and discussed the procedure including the risks, benefits and alternatives for the proposed anesthesia with the patient or authorized representative who has indicated his/her understanding and acceptance.       Plan Discussed with: CRNA, Anesthesiologist and Surgeon  Anesthesia Plan Comments:         Anesthesia Quick Evaluation

## 2018-07-27 NOTE — Interval H&P Note (Signed)
History and Physical Interval Note:  07/27/2018 7:25 AM  Sheena Torres  has presented today for surgery, with the diagnosis of ENDO CERVIX CANCER.  The various methods of treatment have been discussed with the patient and family. After consideration of risks, benefits and other options for treatment, the patient has consented to  Procedure(s): TANDEM RING INSERTION (N/A) OPERATIVE ULTRASOUND (N/A) as a surgical intervention.  The patient's history has been reviewed, patient examined, no change in status, stable for surgery.  I have reviewed the patient's chart and labs.  Questions were answered to the patient's satisfaction.     Gery Pray

## 2018-07-27 NOTE — Anesthesia Procedure Notes (Signed)
Procedure Name: LMA Insertion Date/Time: 07/27/2018 7:35 AM Performed by: Albertha Ghee, MD Pre-anesthesia Checklist: Patient identified, Emergency Drugs available, Suction available and Patient being monitored Patient Re-evaluated:Patient Re-evaluated prior to induction Oxygen Delivery Method: Circle system utilized Preoxygenation: Pre-oxygenation with 100% oxygen Induction Type: IV induction Ventilation: Mask ventilation without difficulty LMA: LMA inserted LMA Size: 4.0 Number of attempts: 1 Airway Equipment and Method: Bite block Placement Confirmation: positive ETCO2 Tube secured with: Tape Dental Injury: Teeth and Oropharynx as per pre-operative assessment

## 2018-07-27 NOTE — Op Note (Signed)
07/27/2018  8:35 AM  PATIENT:  Sheena Torres  53 y.o. female  PRE-OPERATIVE DIAGNOSIS:  ENDO CERVIX CANCER  POST-OPERATIVE DIAGNOSIS:  ENDO CERVIX CANCER  PROCEDURE:  Procedure(s): TANDEM RING INSERTION (N/A) OPERATIVE ULTRASOUND (N/A)  SURGEON:  Surgeon(s) and Role:    * Gery Pray, MD - Primary  PHYSICIAN ASSISTANT:   ASSISTANTS: none   ANESTHESIA:   general  EBL:  10 mL   BLOOD ADMINISTERED:none  DRAINS: Urinary Catheter (Foley)   LOCAL MEDICATIONS USED:  NONE  SPECIMEN:  No Specimen  DISPOSITION OF SPECIMEN:  N/A  COUNTS:  YES  TOURNIQUET:  * No tourniquets in log *  DICTATION: Patient was taken to outpatient OR #4. Timeout performed for the procedure,estimated length of the procedure,preoperative medications. Patient was prepped and draped in the usual sterile fashion and placed in the dorsolithotomy position. A Foley catheter was placed without difficulty. The Foley catheter was backfilled with approximately 200cc of sterile water for imaging purposes. Exam under anesthesia revealed excellent response to her external beam radiation and radiosensitizing chemotherapy. The cervixwas estimated to be approximately 2 by 2.25cm and softer compared to previous exams.there is some erythema to theanterior and posteriorlip of thecervix but no visible lesion, likely radiation effect. On bimanual examination there is no parametrial extension appreciated. Patient proceeded to undergo sounding of the uterus. The uterus was moderately anteverted and sounded to a length of 8.0cm. Excellent intraoperative ultrasound images were obtained. Patient then proceeded to undergo serial dilation of the cervical os. She then had placement of a 60 mm cervical sleeve within the endometrial cavity and endocervical canal. ThisWas then followed by placement of a 83mm, 45 degree tandem within the cervical sleeve. Intraoperative ultrasound showed good placement centrally within  the uterus. Patient then had placement of a 45 degree ring with alargeshielding cap In place. This was then followed by placement of a rectal paddle posteriorly. Final intraoperative imaging showed good placement of the tandem ring apparatusfor the patient's upcominghigh-dose-rate radiation treatment. Patient was subsequently transported to the recovery room in stable condition. Later indaythe patient will be transported to radiation oncology for planning and herfifth and finalhigh-dose-rate treatment. Patient is to receive 5.5 Gy to the high risk clinical target volume. She will be treated with iridium 192 is the high-dose-rate source.  PLAN OF CARE: Transferred to radiation oncology for planning and treatment  PATIENT DISPOSITION:  PACU - hemodynamically stable.   Delay start of Pharmacological VTE agent (>24hrs) due to surgical blood loss or risk of bleeding: not applicable

## 2018-07-28 NOTE — Anesthesia Postprocedure Evaluation (Signed)
Anesthesia Post Note  Patient: Sheena Torres  Procedure(s) Performed: TANDEM RING INSERTION (N/A Cervix) OPERATIVE ULTRASOUND (N/A Cervix)     Patient location during evaluation: PACU Anesthesia Type: General Level of consciousness: awake and alert Pain management: pain level controlled Vital Signs Assessment: post-procedure vital signs reviewed and stable Respiratory status: spontaneous breathing, nonlabored ventilation, respiratory function stable and patient connected to nasal cannula oxygen Cardiovascular status: blood pressure returned to baseline and stable Postop Assessment: no apparent nausea or vomiting Anesthetic complications: no    Last Vitals:  Vitals:   07/27/18 0900 07/27/18 0915  BP: 111/70 105/70  Pulse: 81 82  Resp: 15 (!) 9  Temp:    SpO2: 93% 95%    Last Pain:  Vitals:   07/27/18 0915  TempSrc:   PainSc: 0-No pain                 Eshal Propps S

## 2018-07-29 ENCOUNTER — Encounter: Payer: Self-pay | Admitting: Radiation Oncology

## 2018-07-29 ENCOUNTER — Encounter (HOSPITAL_BASED_OUTPATIENT_CLINIC_OR_DEPARTMENT_OTHER): Payer: Self-pay | Admitting: Radiation Oncology

## 2018-07-29 NOTE — Progress Notes (Signed)
  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 05/14/2018 - 06/24/2018 2. 5/21, 5/28, 6/1, 6/9, 07/27/2018  Site/dose:    1.  A. Cervix / 45 Gy in 25 fractions   B.  boost / 9 Gy in 5 fractions 2. Cervix, Tandem and Ring System / 27.5 Gy in 5 fractions  Beams/energy:    1. 3D, photons / 15X 2. Brachytherapy, HDR / Ir-192   Narrative: The patient tolerated radiation treatment relatively well. With her external beam radiation, she experienced some vaginal bleeding and pelvic bloating towards the beginning, diarrhea controlled with imodium, a moderate rash to her bottom around the midway point, some nausea, and dysuria.   Plan: The patient has completed radiation treatment. The patient will return to radiation oncology clinic for routine followup in one month. I advised them to call or return sooner if they have any questions or concerns related to their recovery or treatment.  -----------------------------------  Blair Promise, PhD, MD  This document serves as a record of services personally performed by Gery Pray, MD. It was created on his behalf by Wilburn Mylar, a trained medical scribe. The creation of this record is based on the scribe's personal observations and the provider's statements to them. This document has been checked and approved by the attending provider.

## 2018-08-13 ENCOUNTER — Telehealth: Payer: Self-pay

## 2018-08-13 ENCOUNTER — Inpatient Hospital Stay: Payer: Self-pay | Attending: Gynecologic Oncology

## 2018-08-13 ENCOUNTER — Other Ambulatory Visit: Payer: Self-pay

## 2018-08-13 ENCOUNTER — Inpatient Hospital Stay: Payer: Self-pay

## 2018-08-13 DIAGNOSIS — C530 Malignant neoplasm of endocervix: Secondary | ICD-10-CM | POA: Insufficient documentation

## 2018-08-13 LAB — CBC WITH DIFFERENTIAL/PLATELET
Abs Immature Granulocytes: 0 10*3/uL (ref 0.00–0.07)
Basophils Absolute: 0 10*3/uL (ref 0.0–0.1)
Basophils Relative: 1 %
Eosinophils Absolute: 0.3 10*3/uL (ref 0.0–0.5)
Eosinophils Relative: 9 %
HCT: 30.3 % — ABNORMAL LOW (ref 36.0–46.0)
Hemoglobin: 10.2 g/dL — ABNORMAL LOW (ref 12.0–15.0)
Immature Granulocytes: 0 %
Lymphocytes Relative: 13 %
Lymphs Abs: 0.4 10*3/uL — ABNORMAL LOW (ref 0.7–4.0)
MCH: 34.7 pg — ABNORMAL HIGH (ref 26.0–34.0)
MCHC: 33.7 g/dL (ref 30.0–36.0)
MCV: 103.1 fL — ABNORMAL HIGH (ref 80.0–100.0)
Monocytes Absolute: 0.3 10*3/uL (ref 0.1–1.0)
Monocytes Relative: 11 %
Neutro Abs: 2.1 10*3/uL (ref 1.7–7.7)
Neutrophils Relative %: 66 %
Platelets: 216 10*3/uL (ref 150–400)
RBC: 2.94 MIL/uL — ABNORMAL LOW (ref 3.87–5.11)
RDW: 13.5 % (ref 11.5–15.5)
WBC: 3.1 10*3/uL — ABNORMAL LOW (ref 4.0–10.5)
nRBC: 0 % (ref 0.0–0.2)

## 2018-08-13 MED ORDER — HEPARIN SOD (PORK) LOCK FLUSH 100 UNIT/ML IV SOLN
500.0000 [IU] | Freq: Once | INTRAVENOUS | Status: AC
  Administered 2018-08-13: 500 [IU]
  Filled 2018-08-13: qty 5

## 2018-08-13 MED ORDER — SODIUM CHLORIDE 0.9% FLUSH
10.0000 mL | Freq: Once | INTRAVENOUS | Status: AC
  Administered 2018-08-13: 10 mL
  Filled 2018-08-13: qty 10

## 2018-08-13 MED ORDER — TBO-FILGRASTIM 300 MCG/0.5ML ~~LOC~~ SOSY: 300.0000 ug | PREFILLED_SYRINGE | Freq: Once | SUBCUTANEOUS | Status: DC

## 2018-08-13 NOTE — Patient Instructions (Signed)

## 2018-08-13 NOTE — Telephone Encounter (Signed)
-----   Message from Heath Lark, MD sent at 08/13/2018 10:02 AM EDT ----- Regarding: labs are better. pls call and let her know. I have ordered PEt for sept, will be scheduled

## 2018-08-13 NOTE — Telephone Encounter (Signed)
Called and given below message. She verbalized understanding. 

## 2018-08-31 ENCOUNTER — Ambulatory Visit
Admission: RE | Admit: 2018-08-31 | Discharge: 2018-08-31 | Disposition: A | Discharge status: Home / Self Care | Payer: Self-pay | Source: Ambulatory Visit | Attending: Radiation Oncology | Admitting: Radiation Oncology

## 2018-08-31 ENCOUNTER — Other Ambulatory Visit: Payer: Self-pay

## 2018-08-31 ENCOUNTER — Encounter: Payer: Self-pay | Admitting: Radiation Oncology

## 2018-08-31 VITALS — BP 116/81 | HR 73 | Temp 98.7°F | Resp 18 | Ht 62.0 in | Wt 150.5 lb

## 2018-08-31 DIAGNOSIS — Z923 Personal history of irradiation: Secondary | ICD-10-CM | POA: Insufficient documentation

## 2018-08-31 DIAGNOSIS — C530 Malignant neoplasm of endocervix: Secondary | ICD-10-CM | POA: Insufficient documentation

## 2018-08-31 NOTE — Progress Notes (Signed)
Pt presents today for f/u with Dr. Sondra Come. Pt reports fatigue has improved by 80%. Pt denies c/o pain. Pt denies dysuria/hematuria. Pt denies vaginal bleeding/discharge. Pt denies rectal bleeding, diarrhea/constipation. Pt denies abdominal bloating, N/V.  BP 116/81 (BP Location: Left Arm, Patient Position: Sitting)   Pulse 73   Temp 98.7 F (37.1 C) (Temporal)   Resp 18   Ht 5\' 2"  (1.575 m)   Wt 150 lb 8 oz (68.3 kg)   LMP  (LMP Unknown) Comment: irregular periods/vaginal bleeding due to tumor   SpO2 100%   BMI 27.53 kg/m   Wt Readings from Last 3 Encounters:  08/31/18 150 lb 8 oz (68.3 kg)  07/27/18 150 lb 2 oz (68.1 kg)  07/21/18 148 lb 9.6 oz (67.4 kg)   Loma Sousa, RN BSN   Home Care Instructions for the Insertion and Care of Your Vaginal Dilator  Why Do I Need a Vaginal Dilator?  Internal radiation therapy may cause scar tissue to form at the top of your vagina (vaginal cuff).  This may make vaginal examinations difficult in the future. You can prevent scar tissue from forming by using a vaginal dilator (a smooth plastic rod), and/or by having regular sexual intercourse.  If not using the dilator you should be having intercourse two or three times a week.  If you are unable to have intercourse, you should use your vaginal dilator.  You may have some spotting or bleeding from your dilator or intercourse the first few times. You may also have some discomfort. If discomfort occurs with intercourse, you and your partner may need to stop for a while and try again later.  How to Use Your Vaginal Dilator  - Wash the dilator with soap and water before and after each use. - Check the dilator to be sure it is smooth. Do not use the dilator if you find any roughspots. - Coat the dilator with K-Y Jelly, Astroglide, or Replens. Do not use Vaseline, baby oil, or other oil based lubricants. They are not water-soluble and can be irritating to the tissues in the vagina. - Lie  on your back with your knees bent and legs apart. - Insert the rounded end of the dilator into your vagina as far as it will go without causing pain or discomfort. - Close your knees and slowly straighten your legs. - Keep the dilator in your vagina for about 10 to 15 minutes.  Please use 3 times a week, for example: Monday, Wednesday and Friday evenings. [facility] your knees, open your legs, and gently remove the dilator. - Gently cleanse the skin around the vaginal opening. - Wash the dilator after each use. -  It is important that you use the dilator routinely until instructed otherwise by your doctor.   Vaginal dilator education given with good understanding. Loma Sousa, RN BSN

## 2018-08-31 NOTE — Progress Notes (Signed)
Radiation Oncology         [phone] ________________________________  Name: Sheena Torres MRN: [id number]  Date: 08/31/2018  DOB: 12-31-1965  Follow-Up Visit Note  CC: Terald Sleeper, PA-C  Everitt Amber, MD    ICD-10-CM   1. 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 05/14/2018 - 06/24/2018 2. 5/21, 5/28, 6/1, 6/9, 07/27/2018  Site/dose:    1.         A. pelvsi / 45 Gy in 25 fractions              B. Parametrial  boost / 9 Gy in 5 fractions 2. Cervix, Tandem and Ring System / 27.5 Gy in 5 fractions  Narrative:  The patient returns today for routine follow-up.  she is doing well overall.   On review of systems, she reports fatigue has improved by 80%. Pt denies c/o pain. Pt denies dysuria/hematuria. Pt denies vaginal bleeding/discharge. Pt denies rectal bleeding, diarrhea/constipation. Pt denies abdominal bloating, N/V, and any other symptoms. Pertinent positives are listed and detailed within the above HPI.                 ALLERGIES:  has No Known Allergies.  Meds: Current Outpatient Medications  Medication Sig Dispense Refill   acetaminophen (TYLENOL) 500 MG tablet Take 1,000 mg by mouth 3 (three) times daily.      Probiotic Product (ALIGN) 4 MG CAPS Take 4 mg by mouth daily.     SUMAtriptan (IMITREX) 100 MG tablet Take 1 tablet (100 mg total) by mouth once for 1 dose. May repeat in 2 hours if no relief. (Patient taking differently: Take 100 mg by mouth every 2 (two) hours as needed for migraine. May repeat in 2 hours if no relief.) 10 tablet 11   vitamin B-12 (CYANOCOBALAMIN) 500 MCG tablet Take 1,000 mcg by mouth daily.     loperamide (IMODIUM) 2 MG capsule Take 2 mg by mouth daily.     magnesium oxide (MAG-OX) 400 (241.3 Mg) MG tablet Take 1 tablet (400 mg total) by mouth 2 (two) times daily. (Patient not taking:  Reported on 08/31/2018) 30 tablet 1   morphine (MSIR) 15 MG tablet Take 1 tablet (15 mg total) by mouth every 6 (six) hours as needed for severe pain. (Patient not taking: Reported on 08/31/2018) 30 tablet 0   No current facility-administered medications for this encounter.     Physical Findings: The patient is in no acute distress. Patient is alert and oriented.  height is 5\' 2"  (1.575 m) and weight is 150 lb 8 oz (68.3 kg). Her temporal temperature is 98.7 F (37.1 C). Her blood pressure is 116/81 and her pulse is 73. Her respiration is 18 and oxygen saturation is 100%. .  No significant changes. Lungs are clear to auscultation bilaterally. Heart has regular rate and rhythm. No palpable cervical, supraclavicular, or axillary adenopathy. Abdomen soft, non-tender, normal bowel sounds. Pelvic exam not performed in light of recent completion of treatment.   Lab Findings: Lab Results  Component Value Date   WBC 3.1 (L) 08/13/2018   HGB 10.2 (L) 08/13/2018   HCT 30.3 (L) 08/13/2018   MCV 103.1 (H) 08/13/2018   PLT 216 08/13/2018    Radiographic Findings: No results found.  Impression:  The patient is  recovering from the effects of radiation.  Doing very well clinically at this time. Reports her energy level is 80% of normal. Pt was given a vaginal dilator and instructions on its use.   Plan:  The pt will follow-up in radiation oncology in 5 months. Prior to that visit she will undergo a PET scan in approximately 2 months and follow-up with Dr. Denman George and Dr. Alvy Bimler.   ____________________________________   Blair Promise, PhD, MD    This document serves as a record of services personally performed by Gery Pray, MD. It was created on his behalf by Mary-Margaret Loma Messing, a trained medical scribe. The creation of this record is based on the scribe's personal observations and the provider's statements to them. This document has been checked and approved by the attending provider.

## 2018-08-31 NOTE — Patient Instructions (Signed)
Home Care Instructions for the Insertion and Care of Your Vaginal Dilator  Why Do I Need a Vaginal Dilator?  Internal radiation therapy may cause scar tissue to form at the top of your vagina (vaginal cuff).  This may make vaginal examinations difficult in the future. You can prevent scar tissue from forming by using a vaginal dilator (a smooth plastic rod), and/or by having regular sexual intercourse.  If not using the dilator you should be having intercourse two or three times a week.  If you are unable to have intercourse, you should use your vaginal dilator.  You may have some spotting or bleeding from your dilator or intercourse the first few times. You may also have some discomfort. If discomfort occurs with intercourse, you and your partner may need to stop for a while and try again later.  How to Use Your Vaginal Dilator  - Wash the dilator with soap and water before and after each use. - Check the dilator to be sure it is smooth. Do not use the dilator if you find any roughspots. - Coat the dilator with K-Y Jelly, Astroglide, or Replens. Do not use Vaseline, baby oil, or other oil based lubricants. They are not water-soluble and can be irritating to the tissues in the vagina. - Lie on your back with your knees bent and legs apart. - Insert the rounded end of the dilator into your vagina as far as it will go without causing pain or discomfort. - Close your knees and slowly straighten your legs. - Keep the dilator in your vagina for about 10 to 15 minutes.  Please use 3 times a week, for example: Monday, Wednesday and Friday evenings. - Bend your knees, open your legs, and gently remove the dilator. - Gently cleanse the skin around the vaginal opening. - Wash the dilator after each use. -  It is important that you use the dilator routinely until instructed otherwise by your doctor.   Coronavirus (COVID-19) Are you at risk?  Are you at risk for the Coronavirus  (COVID-19)?  To be considered HIGH RISK for Coronavirus (COVID-19), you have to meet the following criteria:  . Traveled to [place], [place], [place], [place] or [place]; or in the [place] to [place], [place], [place], or [place]; and have fever, cough, and shortness of breath within the last 2 weeks of travel OR . Been in close contact with a person diagnosed with COVID-19 within the last 2 weeks and have fever, cough, and shortness of breath . IF YOU DO NOT MEET THESE CRITERIA, YOU ARE CONSIDERED LOW RISK FOR COVID-19.  What to do if you are HIGH RISK for COVID-19?  . If you are having a medical emergency, call 911. . Seek medical care right away. Before you go to a doctor's office, urgent care or emergency department, call ahead and tell them about your recent travel, contact with someone diagnosed with COVID-19, and your symptoms. You should receive instructions from your physician's office regarding next steps of care.  . When you arrive at healthcare provider, tell the healthcare staff immediately you have returned from visiting [place], [place], [place], [place] or [place]; or traveled in the [place] to [place], [place], [place], or [place]; in the last two weeks or you have been in close contact with a person diagnosed with COVID-19 in the last 2 weeks.   . Tell the health care staff about your symptoms: fever, cough and shortness of breath. .   After you have been seen by a medical provider, you will be either: o Tested for (COVID-19) and discharged home on quarantine except to seek medical care if symptoms worsen, and asked to  - Stay home and avoid contact with others until you get your results (4-5 days)  - Avoid travel on public transportation if possible (such as bus, train, or airplane) or o Sent to the Emergency Department by EMS for evaluation, COVID-19 testing, and possible admission depending on your condition and test results.  What to do if you are LOW  RISK for COVID-19?  Reduce your risk of any infection by using the same precautions used for avoiding the common cold or flu:  . Wash your hands often with soap and warm water for at least 20 seconds.  If soap and water are not readily available, use an alcohol-based hand sanitizer with at least 60% alcohol.  . If coughing or sneezing, cover your mouth and nose by coughing or sneezing into the elbow areas of your shirt or coat, into a tissue or into your sleeve (not your hands). . Avoid shaking hands with others and consider head nods or verbal greetings only. . Avoid touching your eyes, nose, or mouth with unwashed hands.  . Avoid close contact with people who are sick. . Avoid places or events with large numbers of people in one location, like concerts or sporting events. . Carefully consider travel plans you have or are making. . If you are planning any travel outside or inside the [place], visit the CDC's Travelers' Health webpage for the latest health notices. . If you have some symptoms but not all symptoms, continue to monitor at home and seek medical attention if your symptoms worsen. . If you are having a medical emergency, call 911.   ADDITIONAL HEALTHCARE OPTIONS FOR PATIENTS  [facility] / e-Visit: [email or web address]         [facility]: [phone]  [facility]: [phone]                   [facility]: [phone]   

## 2018-09-07 ENCOUNTER — Telehealth: Payer: Self-pay | Admitting: Oncology

## 2018-09-07 NOTE — Telephone Encounter (Signed)
Called Sheena Torres and notified her of message below from Dr. Alvy Bimler.  She verbalized understanding and agreement.

## 2018-09-07 NOTE — Telephone Encounter (Signed)
The achiness is likely due to bone marrow recovery from treatment I recommend tylenol 1000 mg PO every 6 hours and magnesium 400 mg daily The tingling is from chemo and that should improve in time Vitamin D and calcium supplement can also help with joint pain

## 2018-09-07 NOTE — Telephone Encounter (Signed)
Sheena Torres called and said she has had all over body/joint aches for the past 2 weeks, especially in the backs of her knees.  She said she has started walking again and at first thought the aching was from that but it has not improved.  She said it is worse after sitting for long periods and at night.  She said she can barely get out bed in the morning.  She is taking Tylenol as needed. She also said she is having tingling in her right foot on and off.   She is wondering if this is from treatment and what she can do to make it better.

## 2018-09-09 ENCOUNTER — Telehealth: Payer: Self-pay | Admitting: Oncology

## 2018-09-09 NOTE — Telephone Encounter (Signed)
Sheena Torres with PET scan appointment on 10/27/18 at 9 am (8:30 arrival, NPO p midnight) and follow up with Dr. Denman George on 10/30/18 at 2:15. She verbalized understanding and agreement.

## 2018-09-25 ENCOUNTER — Other Ambulatory Visit: Payer: Self-pay | Admitting: Hematology and Oncology

## 2018-09-25 ENCOUNTER — Telehealth: Payer: Self-pay | Admitting: Oncology

## 2018-09-25 DIAGNOSIS — T451X5A Adverse effect of antineoplastic and immunosuppressive drugs, initial encounter: Secondary | ICD-10-CM

## 2018-09-25 DIAGNOSIS — G620 Drug-induced polyneuropathy: Secondary | ICD-10-CM

## 2018-09-25 MED ORDER — GABAPENTIN 300 MG PO CAPS: 300.0000 mg | ORAL_CAPSULE | Freq: Two times a day (BID) | ORAL | 11 refills | Status: DC

## 2018-09-25 NOTE — Telephone Encounter (Signed)
I think she should try I can send to local pharmacy 300 mg BID Warn about risks of nausea and constipation and sedation I should see her on 8/27 (same day as port flush) to assess, please schedule

## 2018-09-25 NOTE — Telephone Encounter (Signed)
Called [facility] back and advised her of message from Dr. Alvy Bimler.  She would like the gabapentin sent to [facility] in [place], [place].  Also scheduled appointment for 10/08/18 at 9:15.

## 2018-09-25 NOTE — Telephone Encounter (Signed)
Sheena Torres called and said she is taking tylenol which is helping with her joint aches but is still having constant tingling in her right foot, like she is standing on needles.  She said she is limping due to the pain and it is keeping her from sleeping at night. She is wondering if gabapentin would help.

## 2018-09-25 NOTE — Telephone Encounter (Signed)
done

## 2018-10-08 ENCOUNTER — Inpatient Hospital Stay (HOSPITAL_BASED_OUTPATIENT_CLINIC_OR_DEPARTMENT_OTHER): Payer: Self-pay | Admitting: Hematology and Oncology

## 2018-10-08 ENCOUNTER — Other Ambulatory Visit: Payer: Self-pay

## 2018-10-08 ENCOUNTER — Encounter: Payer: Self-pay | Admitting: Hematology and Oncology

## 2018-10-08 ENCOUNTER — Inpatient Hospital Stay: Payer: Self-pay | Attending: Gynecologic Oncology

## 2018-10-08 ENCOUNTER — Inpatient Hospital Stay: Payer: Self-pay

## 2018-10-08 DIAGNOSIS — G620 Drug-induced polyneuropathy: Secondary | ICD-10-CM | POA: Insufficient documentation

## 2018-10-08 DIAGNOSIS — C530 Malignant neoplasm of endocervix: Secondary | ICD-10-CM

## 2018-10-08 DIAGNOSIS — D61818 Other pancytopenia: Secondary | ICD-10-CM

## 2018-10-08 DIAGNOSIS — R197 Diarrhea, unspecified: Secondary | ICD-10-CM

## 2018-10-08 DIAGNOSIS — T451X5A Adverse effect of antineoplastic and immunosuppressive drugs, initial encounter: Secondary | ICD-10-CM

## 2018-10-08 LAB — CBC WITH DIFFERENTIAL/PLATELET
Abs Immature Granulocytes: 0.01 10*3/uL (ref 0.00–0.07)
Basophils Absolute: 0 10*3/uL (ref 0.0–0.1)
Basophils Relative: 1 %
Eosinophils Absolute: 0.2 10*3/uL (ref 0.0–0.5)
Eosinophils Relative: 6 %
HCT: 33.9 % — ABNORMAL LOW (ref 36.0–46.0)
Hemoglobin: 11.1 g/dL — ABNORMAL LOW (ref 12.0–15.0)
Immature Granulocytes: 0 %
Lymphocytes Relative: 16 %
Lymphs Abs: 0.5 10*3/uL — ABNORMAL LOW (ref 0.7–4.0)
MCH: 32.3 pg (ref 26.0–34.0)
MCHC: 32.7 g/dL (ref 30.0–36.0)
MCV: 98.5 fL (ref 80.0–100.0)
Monocytes Absolute: 0.3 10*3/uL (ref 0.1–1.0)
Monocytes Relative: 10 %
Neutro Abs: 2.2 10*3/uL (ref 1.7–7.7)
Neutrophils Relative %: 67 %
Platelets: 237 10*3/uL (ref 150–400)
RBC: 3.44 MIL/uL — ABNORMAL LOW (ref 3.87–5.11)
RDW: 11.5 % (ref 11.5–15.5)
WBC: 3.3 10*3/uL — ABNORMAL LOW (ref 4.0–10.5)
nRBC: 0 % (ref 0.0–0.2)

## 2018-10-08 MED ORDER — HEPARIN SOD (PORK) LOCK FLUSH 100 UNIT/ML IV SOLN
500.0000 [IU] | Freq: Once | INTRAVENOUS | Status: AC
  Administered 2018-10-08: 500 [IU]
  Filled 2018-10-08: qty 5

## 2018-10-08 MED ORDER — SODIUM CHLORIDE 0.9% FLUSH
10.0000 mL | Freq: Once | INTRAVENOUS | Status: AC
  Administered 2018-10-08: 10 mL
  Filled 2018-10-08: qty 10

## 2018-10-08 NOTE — Assessment & Plan Note (Signed)
She has trace peripheral neuropathy from treatment We discussed the risk and benefits of taking gabapentin The patient will consider taking it.  She is written a prescription and if she needs refill, she is reminded to call me

## 2018-10-08 NOTE — Assessment & Plan Note (Signed)
The patient is recovering well from recent treatment She has very minimal residual pancytopenia and neuropathy She has appointment scheduled for PET CT scan next month and GYN oncology follow-up If she has no evidence of residual disease, I will get her port removed

## 2018-10-08 NOTE — Progress Notes (Signed)
[facility] OFFICE PROGRESS NOTE  Patient Care Team: [name] as PCP - General (Physician Assistant)  ASSESSMENT & PLAN:  Malignant neoplasm of endocervix [facility]) The patient is recovering well from recent treatment She has very minimal residual pancytopenia and neuropathy She has appointment scheduled for PET CT scan next month and GYN oncology follow-up If she has no evidence of residual disease, I will get her port removed  Pancytopenia, acquired ([place]) Her pancytopenia is improving She is not symptomatic I recommend observation only She can continue taking vitamin B12 for now  Diarrhea She has very minimum diarrhea She can take Imodium as needed  Peripheral neuropathy due to chemotherapy [facility]) She has trace peripheral neuropathy from treatment We discussed the risk and benefits of taking gabapentin The patient will consider taking it.  She is written a prescription and if she needs refill, she is reminded to call me   No orders of the defined types were placed in this encounter.   INTERVAL HISTORY: Please see below for problem oriented charting. She returns for further follow-up Since last time I saw her, her nausea has resolved She has minimum diarrhea She complained of mild peripheral neuropathy affecting her feet especially on the right side The patient is becoming more active She is putting on some weight Denies further vaginal bleeding or discharge No recent infection, fever or chills   SUMMARY OF ONCOLOGIC HISTORY: Oncology History  Malignant neoplasm of endocervix ([place])  09/15/2017 Initial Diagnosis   The patient reports postcoital bleeding since August, 2019 and postmenopausal bleeding since November, 2019. 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    04/16/2018 Surgery   She was taken to the OR on 04/16/18 for a cervical cone biopsy. The specimen  revealed a grossly normal cervix on the ectocervix.  Cone specimen revealed squamous cell carcinoma.  When discussing this with the pathologist he reported that in the actual cone specimen itself that was CIN 3 however there was a separate fragment of tissue that included carcinoma.  The dimensions of this was 0.6 cm, however this involved the margins.  A post cone ECC was positive for squamous cell carcinoma, as well as an endometrial curettage which also contain benign endometrial glands.    04/16/2018 Pathology Results   Endocervix curettage: [facility]   04/22/2018 Initial Diagnosis   Malignant neoplasm of endocervix ([place])   04/24/2018 PET scan   1. There is a large mass involving the cervix and endometrium which has a maximum dimension of 5.3 cm within SUV max of 20.4. 2. Small bilateral pelvic sidewall lymph nodes measuring less than 1cm with mild nonspecific uptake. The no hypermetabolic adenopathy or evidence of distant metastatic disease. 3. Nonspecific pulmonary nodules measuring less than 5 mm are identified in the right lung. Too small to characterize by PET-CT.    04/27/2018 Imaging   MRI pelvis  1. Uterine cervix 5.5 x 3.4 x 3.2 cm mass compatible with primary cervical malignancy, with mild parametrial invasion. Stage IIB by MRI. 2. Small volume simple free fluid in the pelvic cul-de-sac. 3. No pathologically enlarged pelvic lymph nodes, see comments.   05/01/2018 Cancer Staging   Staging form: Cervix Uteri, AJCC 8th Edition - Clinical: FIGO Stage IIB (cT2b, cN0, cM0) - Signed by Heath Lark, MD on 05/01/2018   05/08/2018 Procedure   Placement of a subcutaneous port device. Catheter tip at the  superior cavoatrial junction.   05/14/2018 - 07/27/2018 Radiation Therapy   Radiation treatment dates:    1. 05/14/2018 - 06/24/2018 2. 5/21, 5/28, 6/1, 6/9, 07/27/2018   Site/dose:    1.         A. pelvsi / 45 Gy in 25 fractions              B. Parametrial  boost / 9 Gy in 5 fractions 2. Cervix,  Tandem and Ring System / 27.5 Gy in 5 fractions     05/15/2018 - 06/26/2018 Chemotherapy   The patient had weekly cisplatin     REVIEW OF SYSTEMS:   Constitutional: Denies fevers, chills or abnormal weight loss Eyes: Denies blurriness of vision Ears, nose, mouth, throat, and face: Denies mucositis or sore throat Respiratory: Denies cough, dyspnea or wheezes Cardiovascular: Denies palpitation, chest discomfort or lower extremity swelling Skin: Denies abnormal skin rashes Lymphatics: Denies new lymphadenopathy or easy bruising Behavioral/Psych: Mood is stable, no new changes  All other systems were reviewed with the patient and are negative.  I have reviewed the past medical history, past surgical history, social history and family history with the patient and they are unchanged from previous note.  ALLERGIES:  has No Known Allergies.  MEDICATIONS:  Current Outpatient Medications  Medication Sig Dispense Refill  . acetaminophen (TYLENOL) 500 MG tablet Take 1,000 mg by mouth 3 (three) times daily.     Marland Kitchen gabapentin (NEURONTIN) 300 MG capsule Take 1 capsule (300 mg total) by mouth 2 (two) times daily. 60 capsule 11  . Probiotic Product (ALIGN) 4 MG CAPS Take 4 mg by mouth daily.    . SUMAtriptan (IMITREX) 100 MG tablet Take 1 tablet (100 mg total) by mouth once for 1 dose. May repeat in 2 hours if no relief. (Patient taking differently: Take 100 mg by mouth every 2 (two) hours as needed for migraine. May repeat in 2 hours if no relief.) 10 tablet 11  . vitamin B-12 (CYANOCOBALAMIN) 500 MCG tablet Take 1,000 mcg by mouth daily.     No current facility-administered medications for this visit.     PHYSICAL EXAMINATION: ECOG PERFORMANCE STATUS: 1 - Symptomatic but completely ambulatory  Vitals:   10/08/18 0912  BP: 122/75  Pulse: 72  Resp: 16  Temp: 98.7 F (37.1 C)  SpO2: 100%   Filed Weights   10/08/18 0912  Weight: 152 lb 3.2 oz (69 kg)    GENERAL:alert, no distress and  comfortable SKIN: skin color, texture, turgor are normal, no rashes or significant lesions EYES: normal, Conjunctiva are pink and non-injected, sclera clear OROPHARYNX:no exudate, no erythema and lips, buccal mucosa, and tongue normal  NECK: supple, thyroid normal size, non-tender, without nodularity LYMPH:  no palpable lymphadenopathy in the cervical, axillary or inguinal LUNGS: clear to auscultation and percussion with normal breathing effort HEART: regular rate & rhythm and no murmurs and no lower extremity edema ABDOMEN:abdomen soft, non-tender and normal bowel sounds Musculoskeletal:no cyanosis of digits and no clubbing  NEURO: alert & oriented x 3 with fluent speech, no focal motor/sensory deficits  LABORATORY DATA:  I have reviewed the data as listed    Component Value Date/Time   NA 137 06/29/2018 0813   K 4.5 06/29/2018 0813   CL 104 06/29/2018 0813   CO2 26 06/29/2018 0813   GLUCOSE 92 06/29/2018 0813   BUN 11 06/29/2018 0813   CREATININE 0.79 06/29/2018 0813   CALCIUM 8.9 06/29/2018 0813   PROT 6.2 (L) 06/29/2018 0813  ALBUMIN 3.5 06/29/2018 0813   AST 14 (L) 06/29/2018 0813   ALT 14 06/29/2018 0813   ALKPHOS 67 06/29/2018 0813   BILITOT 0.3 06/29/2018 0813   GFRNONAA >60 06/29/2018 0813   GFRAA >60 06/29/2018 0813    No results found for: SPEP, UPEP  Lab Results  Component Value Date   WBC 3.3 (L) 10/08/2018   NEUTROABS 2.2 10/08/2018   HGB 11.1 (L) 10/08/2018   HCT 33.9 (L) 10/08/2018   MCV 98.5 10/08/2018   PLT 237 10/08/2018      Chemistry      Component Value Date/Time   NA 137 06/29/2018 0813   K 4.5 06/29/2018 0813   CL 104 06/29/2018 0813   CO2 26 06/29/2018 0813   BUN 11 06/29/2018 0813   CREATININE 0.79 06/29/2018 0813      Component Value Date/Time   CALCIUM 8.9 06/29/2018 0813   ALKPHOS 67 06/29/2018 0813   AST 14 (L) 06/29/2018 0813   ALT 14 06/29/2018 0813   BILITOT 0.3 06/29/2018 0813       All questions were answered. The  patient knows to call the clinic with any problems, questions or concerns. No barriers to learning was detected.  I spent 15 minutes counseling the patient face to face. The total time spent in the appointment was 20 minutes and more than 50% was on counseling and review of test results  Heath Lark, MD 10/08/2018 12:36 PM

## 2018-10-08 NOTE — Assessment & Plan Note (Signed)
Her pancytopenia is improving She is not symptomatic I recommend observation only She can continue taking vitamin B12 for now

## 2018-10-08 NOTE — Assessment & Plan Note (Signed)
She has very minimum diarrhea She can take Imodium as needed

## 2018-10-27 ENCOUNTER — Inpatient Hospital Stay: Payer: Self-pay | Attending: Gynecologic Oncology

## 2018-10-27 ENCOUNTER — Encounter (HOSPITAL_COMMUNITY)
Admission: RE | Admit: 2018-10-27 | Discharge: 2018-10-27 | Disposition: A | Discharge status: Home / Self Care | Payer: Self-pay | Source: Ambulatory Visit | Attending: Hematology and Oncology | Admitting: Hematology and Oncology

## 2018-10-27 ENCOUNTER — Inpatient Hospital Stay: Payer: Self-pay

## 2018-10-27 ENCOUNTER — Other Ambulatory Visit: Payer: Self-pay

## 2018-10-27 DIAGNOSIS — Z923 Personal history of irradiation: Secondary | ICD-10-CM | POA: Insufficient documentation

## 2018-10-27 DIAGNOSIS — C530 Malignant neoplasm of endocervix: Secondary | ICD-10-CM

## 2018-10-27 DIAGNOSIS — F909 Attention-deficit hyperactivity disorder, unspecified type: Secondary | ICD-10-CM | POA: Insufficient documentation

## 2018-10-27 DIAGNOSIS — C539 Malignant neoplasm of cervix uteri, unspecified: Secondary | ICD-10-CM | POA: Insufficient documentation

## 2018-10-27 DIAGNOSIS — Z9221 Personal history of antineoplastic chemotherapy: Secondary | ICD-10-CM | POA: Insufficient documentation

## 2018-10-27 DIAGNOSIS — K589 Irritable bowel syndrome without diarrhea: Secondary | ICD-10-CM | POA: Insufficient documentation

## 2018-10-27 DIAGNOSIS — R918 Other nonspecific abnormal finding of lung field: Secondary | ICD-10-CM | POA: Insufficient documentation

## 2018-10-27 LAB — CBC WITH DIFFERENTIAL/PLATELET
Abs Immature Granulocytes: 0.01 10*3/uL (ref 0.00–0.07)
Basophils Absolute: 0 10*3/uL (ref 0.0–0.1)
Basophils Relative: 0 %
Eosinophils Absolute: 0.2 10*3/uL (ref 0.0–0.5)
Eosinophils Relative: 6 %
HCT: 33.8 % — ABNORMAL LOW (ref 36.0–46.0)
Hemoglobin: 11.4 g/dL — ABNORMAL LOW (ref 12.0–15.0)
Immature Granulocytes: 0 %
Lymphocytes Relative: 17 %
Lymphs Abs: 0.5 10*3/uL — ABNORMAL LOW (ref 0.7–4.0)
MCH: 32.4 pg (ref 26.0–34.0)
MCHC: 33.7 g/dL (ref 30.0–36.0)
MCV: 96 fL (ref 80.0–100.0)
Monocytes Absolute: 0.3 10*3/uL (ref 0.1–1.0)
Monocytes Relative: 10 %
Neutro Abs: 2.1 10*3/uL (ref 1.7–7.7)
Neutrophils Relative %: 67 %
Platelets: 249 10*3/uL (ref 150–400)
RBC: 3.52 MIL/uL — ABNORMAL LOW (ref 3.87–5.11)
RDW: 11.9 % (ref 11.5–15.5)
WBC: 3.1 10*3/uL — ABNORMAL LOW (ref 4.0–10.5)
nRBC: 0 % (ref 0.0–0.2)

## 2018-10-27 LAB — GLUCOSE, CAPILLARY: Glucose-Capillary: 97 mg/dL (ref 70–99)

## 2018-10-27 MED ORDER — SODIUM CHLORIDE 0.9% FLUSH
10.0000 mL | Freq: Once | INTRAVENOUS | Status: DC
  Filled 2018-10-27: qty 10

## 2018-10-27 MED ORDER — FLUDEOXYGLUCOSE F - 18 (FDG) INJECTION
7.3200 | Freq: Once | INTRAVENOUS | Status: AC | PRN
  Administered 2018-10-27: 7.32 via INTRAVENOUS

## 2018-10-30 ENCOUNTER — Inpatient Hospital Stay (HOSPITAL_BASED_OUTPATIENT_CLINIC_OR_DEPARTMENT_OTHER): Payer: Self-pay | Admitting: Gynecologic Oncology

## 2018-10-30 ENCOUNTER — Other Ambulatory Visit: Payer: Self-pay

## 2018-10-30 ENCOUNTER — Encounter: Payer: Self-pay | Admitting: Gynecologic Oncology

## 2018-10-30 VITALS — BP 118/78 | HR 71 | Temp 98.0°F | Resp 18 | Ht 62.0 in | Wt 151.0 lb

## 2018-10-30 DIAGNOSIS — C539 Malignant neoplasm of cervix uteri, unspecified: Secondary | ICD-10-CM

## 2018-10-30 DIAGNOSIS — C530 Malignant neoplasm of endocervix: Secondary | ICD-10-CM

## 2018-10-30 DIAGNOSIS — Z923 Personal history of irradiation: Secondary | ICD-10-CM

## 2018-10-30 DIAGNOSIS — Z9221 Personal history of antineoplastic chemotherapy: Secondary | ICD-10-CM

## 2018-10-30 NOTE — Patient Instructions (Signed)
Continue to use your vaginal dilator.  It is safe to have the port removed.  Please notify Dr Denman George at phone number [phone] if you notice vaginal bleeding, new pelvic or abdominal pains, bloating, feeling full easy, or a change in bladder or bowel function.   Please have Dr Clabe Seal office contact Dr Serita Grit office (at [phone]) in December to request an appointment with her for March, 2021.

## 2018-10-30 NOTE — Progress Notes (Signed)
. Follow-up Note: Gyn-Onc  Consult was requested by Dr. Rosario Adie for the evaluation of TORREY PONS 53 y.o. female  CC:  Chief Complaint  Patient presents with  . Cervical Cancer    follow-up    Assessment/Plan:  Ms. OLESYA MELLE  is a 53 y.o.  year old with clinical stage IIB cervical cancer on cone biopsy. It is visible in the cone bed and palpably approximately 4cm  S/p chemoradiation completed June 15th, 2020. Complete clinical response.  I recommend 3 monthly evaluations with either Dr Sondra Come or myself. We will perform pap testing annually in February.  HPI: Ms Zuliana Dupont is a 53 year old P3 who is seen in consultation at the request of Dr Rosario Adie for clinical stage I cervical cancer.  The patient reports postcoital bleeding since August, 2019 and postmenopausal bleeding since November, 2019.  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  She was taken to the OR on 04/16/18 for a cervical cone biopsy. The specimen revealed a grossly normal cervix on the ectocervix.  Cone specimen revealed squamous cell carcinoma.  When discussing this with the pathologist he reported that in the actual cone specimen itself that was CIN 3 however there was a separate fragment of tissue that included carcinoma.  The dimensions of this was 0.6 cm, however this involved the margins.  A post cone ECC was positive for squamous cell carcinoma, as well as an endometrial curettage which also contain benign endometrial glands.  The patient reports a longstanding history of abnormal Pap smears since her 36s.  She had an abnormal Pap smear again during pregnancy in 2000 that was treated with biopsies but no other intervention.  6 years ago in 2014 she had an abnormal Pap smear with biopsies that she states everything turned out okay.  She states that she had had normal Pap smears since that time.  Her most recent Pap smear prior to diagnosis was in February 2018 which she says was  normal.  The patient has history of 2 prior vaginal deliveries and 1 prior cesarean section.  She has had no other abdominal surgeries.  Her any prior medical condition is migraines.  She is a non-smoker and has never smoked.  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  PET CT was performed on 04/24/18 and showed a large mass involving the cervix and endometrium which has a maximum dimension of 5.3 cm within SUV max of 20.4. Small bilateral pelvic sidewall lymph nodes measuring less than 1cm with mild nonspecific uptake. The no hypermetabolic adenopathy or evidence of distant metastatic disease. Nonspecific pulmonary nodules measuring less than 5 mm are identified in the right lung. Too small to characterize by PET-CT.Marland Kitchen  MRI of the pelvis on 04/27/18 showed no pathologically enlarged lymph nodes in the pelvis. Bilateral pelvic sidewall (external iliac) rounded subcentimeter lymph nodes measuring 0.6 cm on the right and 0.6 cm on the left unchanged. The anteverted uterus measures 8.9 x 4.8 x 5.2 cm.There is a T2 hyperintense 5.5 x 3.4 x 3.2 cm mass in the uterine cervix. This mass invades the lower uterine body. No clear invasion of the vagina by the mass. There is loss of the normal T2 hypointense outer cervical stroma in multiple locations, including along the posterior upper left cervix and lateral lower cervix, compatible with mild parametrial invasion. No direct invasion of colon or bladder. No extension to the pelvic  sidewall. No hydronephrosis on limited views of the kidneys. There are no suspicious ovarian or adnexal masses.   Interval Hx:  She was assigned as stage IIB cervical squamous cell carcinoma and was dispositioned to chemoradiation with curative intent.  Between May 14, 2018 and July 27, 2018 she received chemoradiation.  This consisted of 45 Gray in 25 fractions to the pelvis, a parametrial boost of 9 Gy in 5 fractions, and cervix tandem and ring system  with 5 fractions for a total of 27.5 Gray.  During that time she received weekly cisplatin radiosensitizing chemotherapy.  She had no delays in therapy or interruptions.  She stated that she tolerated treatment well.  Posttreatment PET imaging performed on October 27, 2018 revealed that the previously seen bulky soft tissue mass in the cervix had diminished in size and currently measured 4.4 x 3.3 cm.  There is minimal residual FDG uptake at that site with an SUV max of 3 compared to 20 previously.  No hypermetabolic lymph nodes were seen in the abdomen or pelvis.  The previously seen subcentimeter left pelvic sidewall and external iliac lymph nodes are no longer visualized.  She reports that she is feeling well, back to work, with no complaints.  Current Meds:  Outpatient Encounter Medications as of 10/30/2018  Medication Sig  . acetaminophen (TYLENOL) 500 MG tablet Take 1,000 mg by mouth 3 (three) times daily.   . Probiotic Product (ALIGN) 4 MG CAPS Take 4 mg by mouth daily.  . vitamin B-12 (CYANOCOBALAMIN) 500 MCG tablet Take 1,000 mcg by mouth daily.  . SUMAtriptan (IMITREX) 100 MG tablet Take 1 tablet (100 mg total) by mouth once for 1 dose. May repeat in 2 hours if no relief. (Patient taking differently: Take 100 mg by mouth every 2 (two) hours as needed for migraine. May repeat in 2 hours if no relief.)  . [DISCONTINUED] gabapentin (NEURONTIN) 300 MG capsule Take 1 capsule (300 mg total) by mouth 2 (two) times daily.  . [DISCONTINUED] prochlorperazine (COMPAZINE) 10 MG tablet Take 1 tablet (10 mg total) by mouth every 6 (six) hours as needed (Nausea or vomiting).   No facility-administered encounter medications on file as of 10/30/2018.     Allergy: No Known Allergies  Social Hx:   Social History   Socioeconomic History  . Marital status: Divorced    Spouse name: Not on file  . Number of children: 3  . Years of education: Not on file  . Highest education level: Not on file   Occupational History  . Occupation: Leisure centre manager  Social Needs  . Financial resource strain: Not on file  . Food insecurity    Worry: Not on file    Inability: Not on file  . Transportation needs    Medical: Not on file    Non-medical: Not on file  Tobacco Use  . Smoking status: Never Smoker  . Smokeless tobacco: Never Used  Substance and Sexual Activity  . Alcohol use: Not Currently    Comment: occasional   . Drug use: Never  . Sexual activity: Not on file  Lifestyle  . Physical activity    Days per week: Not on file    Minutes per session: Not on file  . Stress: Not on file  Relationships  . Social Herbalist on phone: Not on file    Gets together: Not on file    Attends religious service: Not on file    Active member of club or  organization: Not on file    Attends meetings of clubs or organizations: Not on file    Relationship status: Not on file  . Intimate partner violence    Fear of current or ex partner: Not on file    Emotionally abused: Not on file    Physically abused: Not on file    Forced sexual activity: Not on file  Other Topics Concern  . Not on file  Social History Narrative  . Not on file    Past Surgical Hx:  Past Surgical History:  Procedure Laterality Date  . BREAST ENHANCEMENT SURGERY Bilateral 2017   w/ implants  . CESAREAN SECTION  2000  . IR IMAGING GUIDED PORT INSERTION  05/08/2018  . OPERATIVE ULTRASOUND N/A 07/02/2018   Procedure: OPERATIVE ULTRASOUND;  Surgeon: Gery Pray, MD;  Location: [facility];  Service: Urology;  Laterality: N/A;  . OPERATIVE ULTRASOUND N/A 07/09/2018   Procedure: OPERATIVE ULTRASOUND;  Surgeon: Gery Pray, MD;  Location: [facility];  Service: Urology;  Laterality: N/A;  . OPERATIVE ULTRASOUND N/A 07/13/2018   Procedure: OPERATIVE ULTRASOUND;  Surgeon: Gery Pray, MD;  Location: [facility];  Service: Urology;  Laterality: N/A;  . OPERATIVE ULTRASOUND  N/A 07/21/2018   Procedure: OPERATIVE ULTRASOUND;  Surgeon: Gery Pray, MD;  Location: [facility];  Service: Urology;  Laterality: N/A;  . OPERATIVE ULTRASOUND N/A 07/27/2018   Procedure: OPERATIVE ULTRASOUND;  Surgeon: Gery Pray, MD;  Location: [facility];  Service: Urology;  Laterality: N/A;  . TANDEM RING INSERTION N/A 07/02/2018   Procedure: TANDEM RING INSERTION;  Surgeon: Gery Pray, MD;  Location[facility];  Service: Urology;  Laterality: N/A;  . TANDEM RING INSERTION N/A 07/09/2018   Procedure: TANDEM RING INSERTION;  Surgeon: Gery Pray, MD;  Location: [facility];  Service: Urology;  Laterality: N/A;  . TANDEM RING INSERTION N/A 07/13/2018   Procedure: TANDEM RING INSERTION;  Surgeon: Gery Pray, MD;  Location: [facility];  Service: Urology;  Laterality: N/A;  . TANDEM RING INSERTION N/A 07/21/2018   Procedure: TANDEM RING INSERTION;  Surgeon: Gery Pray, MD;  Location: [facility];  Service: Urology;  Laterality: N/A;  . TANDEM RING INSERTION N/A 07/27/2018   Procedure: TANDEM RING INSERTION;  Surgeon: Gery Pray, MD;  Location: [facility];  Service: Urology;  Laterality: N/A;    Past Medical Hx:  Past Medical History:  Diagnosis Date  . ADHD   . Cervical cancer [facility]) oncologist-- dr gorsuch/  dr Sondra Come   Stage IIB, SCC--- started chemo 05-14-2018;  started external beam radiation 05-15-2018  . Chemotherapy-induced nausea   . Fatigue   . Hypomagnesemia   . IBS (irritable bowel syndrome)    w/ diarrhea  . Migraine   . Pancytopenia, acquired ([place])   . Sensation of pressure in bladder area    occasional due to radiation therapy    Past Gynecological History:  P3 No LMP recorded (lmp unknown). Patient is postmenopausal.  Family Hx:  Family History  Problem Relation Age of Onset  . Heart disease Mother   . Diabetes Father   . Heart disease Father    . Cancer Maternal Grandmother        mouth/jaw cancer    Review of Systems:  Constitutional  Feels well,   ENT Normal appearing ears and nares bilaterally Skin/Breast  No rash, sores, jaundice, itching, dryness Cardiovascular  No chest pain,  shortness of breath, or edema  Pulmonary  No cough or wheeze.  Gastro Intestinal  No nausea, vomitting, or diarrhoea. No bright red blood per rectum, no abdominal pain, change in bowel movement, or constipation.  Genito Urinary  No frequency, urgency, dysuria, no bleeding Musculo Skeletal  No myalgia, arthralgia, joint swelling or pain  Neurologic  No weakness, numbness, change in gait,  Psychology  No depression, anxiety, insomnia.   Vitals:  Blood pressure 118/78, pulse 71, temperature 98 F (36.7 C), temperature source Tympanic, resp. rate 18, height 5\' 2"  (1.575 m), weight 151 lb (68.5 kg), SpO2 99 %.  Physical Exam: WD in NAD Neck  Supple NROM, without any enlargements.  Lymph Node Survey No cervical supraclavicular or inguinal adenopathy Cardiovascular  Pulse normal rate, regularity and rhythm. S1 and S2 normal.  Lungs  Clear to auscultation bilateraly, without wheezes/crackles/rhonchi. Good air movement.  Skin  No rash/lesions/breakdown  Psychiatry  Alert and oriented to person, place, and time  Abdomen  Normoactive bowel sounds, abdomen soft, non-tender and thin without evidence of hernia.  Back No CVA tenderness Genito Urinary  Vulva/vagina: Normal external female genitalia.   No lesions. No discharge or bleeding.  Bladder/urethra:  No lesions or masses, well supported bladder  Vagina: normal  Cervix: no visible tumor, no palpable masses, cervix flush with vagina. Radiation changes and agglutination present.     Uterus:  Small, mobile, no parametrial involvement or nodularity.  Adnexa: no palpable masses. Rectal  Good tone, no masses no cul de sac nodularity. No parametrial extension.  Extremities  No bilateral  cyanosis, clubbing or edema.   Thereasa Solo, MD  10/30/2018, 5:01 PM

## 2018-11-02 ENCOUNTER — Other Ambulatory Visit: Payer: Self-pay | Admitting: Hematology and Oncology

## 2018-11-02 ENCOUNTER — Telehealth: Payer: Self-pay | Admitting: Oncology

## 2018-11-02 DIAGNOSIS — C530 Malignant neoplasm of endocervix: Secondary | ICD-10-CM

## 2018-11-02 NOTE — Telephone Encounter (Signed)
Left a message advising that Dr. Alvy Bimler has put in an order for port removal and that IR will be calling to schedule it in the next few days. Also advised that her port flush/lab appointments have been canceled and to call back with any questions.

## 2018-11-13 ENCOUNTER — Other Ambulatory Visit: Payer: Self-pay | Admitting: Radiology

## 2018-11-16 ENCOUNTER — Ambulatory Visit (HOSPITAL_COMMUNITY)
Admission: RE | Admit: 2018-11-16 | Discharge: 2018-11-16 | Disposition: A | Discharge status: Home / Self Care | Payer: Self-pay | Source: Ambulatory Visit | Attending: Hematology and Oncology | Admitting: Hematology and Oncology

## 2018-11-16 ENCOUNTER — Other Ambulatory Visit: Payer: Self-pay

## 2018-11-16 ENCOUNTER — Encounter (HOSPITAL_COMMUNITY): Payer: Self-pay

## 2018-11-16 DIAGNOSIS — Z9221 Personal history of antineoplastic chemotherapy: Secondary | ICD-10-CM | POA: Insufficient documentation

## 2018-11-16 DIAGNOSIS — C530 Malignant neoplasm of endocervix: Secondary | ICD-10-CM | POA: Insufficient documentation

## 2018-11-16 DIAGNOSIS — F909 Attention-deficit hyperactivity disorder, unspecified type: Secondary | ICD-10-CM | POA: Insufficient documentation

## 2018-11-16 DIAGNOSIS — K589 Irritable bowel syndrome without diarrhea: Secondary | ICD-10-CM | POA: Insufficient documentation

## 2018-11-16 DIAGNOSIS — Z452 Encounter for adjustment and management of vascular access device: Secondary | ICD-10-CM | POA: Insufficient documentation

## 2018-11-16 HISTORY — PX: IR REMOVAL TUN ACCESS W/ PORT W/O FL MOD SED: IMG2290

## 2018-11-16 LAB — CBC WITH DIFFERENTIAL/PLATELET
Abs Immature Granulocytes: 0.01 10*3/uL (ref 0.00–0.07)
Basophils Absolute: 0 10*3/uL (ref 0.0–0.1)
Basophils Relative: 1 %
Eosinophils Absolute: 0.2 10*3/uL (ref 0.0–0.5)
Eosinophils Relative: 8 %
HCT: 35.7 % — ABNORMAL LOW (ref 36.0–46.0)
Hemoglobin: 11.8 g/dL — ABNORMAL LOW (ref 12.0–15.0)
Immature Granulocytes: 0 %
Lymphocytes Relative: 16 %
Lymphs Abs: 0.5 10*3/uL — ABNORMAL LOW (ref 0.7–4.0)
MCH: 32.4 pg (ref 26.0–34.0)
MCHC: 33.1 g/dL (ref 30.0–36.0)
MCV: 98.1 fL (ref 80.0–100.0)
Monocytes Absolute: 0.3 10*3/uL (ref 0.1–1.0)
Monocytes Relative: 9 %
Neutro Abs: 1.9 10*3/uL (ref 1.7–7.7)
Neutrophils Relative %: 66 %
Platelets: 247 10*3/uL (ref 150–400)
RBC: 3.64 MIL/uL — ABNORMAL LOW (ref 3.87–5.11)
RDW: 12.2 % (ref 11.5–15.5)
WBC: 2.9 10*3/uL — ABNORMAL LOW (ref 4.0–10.5)
nRBC: 0 % (ref 0.0–0.2)

## 2018-11-16 LAB — PROTIME-INR
INR: 0.9 (ref 0.8–1.2)
Prothrombin Time: 12.5 seconds (ref 11.4–15.2)

## 2018-11-16 MED ORDER — MIDAZOLAM HCL 2 MG/2ML IJ SOLN
INTRAMUSCULAR | Status: AC
  Filled 2018-11-16: qty 4

## 2018-11-16 MED ORDER — FENTANYL CITRATE (PF) 100 MCG/2ML IJ SOLN
INTRAMUSCULAR | Status: AC
  Filled 2018-11-16: qty 2

## 2018-11-16 MED ORDER — CEFAZOLIN SODIUM-DEXTROSE 2-4 GM/100ML-% IV SOLN
INTRAVENOUS | Status: AC
  Administered 2018-11-16: 2 g via INTRAVENOUS
  Filled 2018-11-16: qty 100

## 2018-11-16 MED ORDER — FENTANYL CITRATE (PF) 100 MCG/2ML IJ SOLN
INTRAMUSCULAR | Status: AC | PRN
  Administered 2018-11-16 (×2): 50 ug via INTRAVENOUS

## 2018-11-16 MED ORDER — LIDOCAINE-EPINEPHRINE 1 %-1:100000 IJ SOLN
INTRAMUSCULAR | Status: AC
  Filled 2018-11-16: qty 1

## 2018-11-16 MED ORDER — MIDAZOLAM HCL 2 MG/2ML IJ SOLN
INTRAMUSCULAR | Status: AC | PRN
  Administered 2018-11-16 (×2): 1 mg via INTRAVENOUS

## 2018-11-16 MED ORDER — SODIUM CHLORIDE 0.9 % IV SOLN
INTRAVENOUS | Status: DC
  Administered 2018-11-16: 11:00:00 via INTRAVENOUS

## 2018-11-16 MED ORDER — CEFAZOLIN SODIUM-DEXTROSE 2-4 GM/100ML-% IV SOLN
2.0000 g | INTRAVENOUS | Status: AC
  Administered 2018-11-16: 12:00:00 2 g via INTRAVENOUS

## 2018-11-16 MED ORDER — LIDOCAINE-EPINEPHRINE (PF) 1 %-1:200000 IJ SOLN
INTRAMUSCULAR | Status: AC | PRN
  Administered 2018-11-16: 10 mL

## 2018-11-16 NOTE — Discharge Instructions (Signed)
Implanted Port Removal, Care After This sheet gives you information about how to care for yourself after your procedure. Your health care provider may also give you more specific instructions. If you have problems or questions, contact your health care provider. What can I expect after the procedure? After the procedure, it is common to have:  Soreness or pain near your incision.  Some swelling or bruising near your incision. Follow these instructions at home: Medicines  Take over-the-counter and prescription medicines only as told by your health care provider.  If you were prescribed an antibiotic medicine, take it as told by your health care provider. Do not stop taking the antibiotic even if you start to feel better. Bathing  Do not take baths, swim, or use a hot tub until your health care provider approves. Ask your health care provider if you can take showers. You may only be allowed to take sponge baths. Incision care   Follow instructions from your health care provider about how to take care of your incision. Make sure you: ? Wash your hands with soap and water before you change your bandage (dressing). If soap and water are not available, use hand sanitizer. ? Change your dressing as told by your health care provider. ? Keep your dressing dry.  Leave skin glue in place. These skin closures may need to stay in place for 2 weeks or longer.Check your incision area every day for signs of infection.  Check for: ? More redness, swelling, or pain. ? More fluid or blood. ? Warmth. ? Pus or a bad smell. Driving   Do not drive for 24 hours if you were given a medicine to help you relax (sedative) during your procedure.  If you did not receive a sedative, ask your health care provider when it is safe to drive. Activity  Return to your normal activities as told by your health care provider. Ask your health care provider what activities are safe for you.  Do not lift anything  that is heavier than 10 lb (4.5 kg), or the limit that you are told, until your health care provider says that it is safe.  Do not do activities that involve lifting your arms over your head. General instructions  Do not use any products that contain nicotine or tobacco, such as cigarettes and e-cigarettes. These can delay healing. If you need help quitting, ask your health care provider.  Keep all follow-up visits as told by your health care provider. This is important. Contact a health care provider if:  You have more redness, swelling, or pain around your incision.  You have more fluid or blood coming from your incision.  Your incision feels warm to the touch.  You have pus or a bad smell coming from your incision.  You have pain that is not relieved by your pain medicine. Get help right away if you have:  A fever or chills.  Chest pain.  Difficulty breathing. Summary  After the procedure, it is common to have pain, soreness, swelling, or bruising near your incision.  If you were prescribed an antibiotic medicine, take it as told by your health care provider. Do not stop taking the antibiotic even if you start to feel better.  Do not drive for 24 hours if you were given a sedative during your procedure.  Return to your normal activities as told by your health care provider. Ask your health care provider what activities are safe for you. This information is not intended  to replace advice given to you by your health care provider. Make sure you discuss any questions you have with your health care provider. Document Released: 01/09/2015 Document Revised: 03/13/2017 Document Reviewed: 03/13/2017 Elsevier Patient Education  [place].    Moderate Conscious Sedation, Adult, Care After These instructions provide you with information about caring for yourself after your procedure. Your health care provider may also give you more specific instructions. Your treatment has  been planned according to current medical practices, but problems sometimes occur. Call your health care provider if you have any problems or questions after your procedure. What can I expect after the procedure? After your procedure, it is common:  To feel sleepy for several hours.  To feel clumsy and have poor balance for several hours.  To have poor judgment for several hours.  To vomit if you eat too soon. Follow these instructions at home: For at least 24 hours after the procedure:   Do not: ? Participate in activities where you could fall or become injured. ? Drive. ? Use heavy machinery. ? Drink alcohol. ? Take sleeping pills or medicines that cause drowsiness. ? Make important decisions or sign legal documents. ? Take care of children on your own.  Rest. Eating and drinking  Follow the diet recommended by your health care provider.  If you vomit: ? Drink water, juice, or soup when you can drink without vomiting. ? Make sure you have little or no nausea before eating solid foods. General instructions  Have a responsible adult stay with you until you are awake and alert.  Take over-the-counter and prescription medicines only as told by your health care provider.  If you smoke, do not smoke without supervision.  Keep all follow-up visits as told by your health care provider. This is important. Contact a health care provider if:  You keep feeling nauseous or you keep vomiting.  You feel light-headed.  You develop a rash.  You have a fever. Get help right away if:  You have trouble breathing. This information is not intended to replace advice given to you by your health care provider. Make sure you discuss any questions you have with your health care provider. Document Released: 11/18/2012 Document Revised: 01/10/2017 Document Reviewed: 05/20/2015 Elsevier Patient Education  2020 [employer].

## 2018-11-16 NOTE — Procedures (Signed)
Pre Procedural Dx: Poor venous access Post Procedural Dx: Same  Successful removal of anterior chest wall port-a-cath.  EBL: Minimal  No immediate post procedural complications.   Jay Hoover Grewe, MD Pager #: [phone]   

## 2018-11-16 NOTE — H&P (Signed)
Chief Complaint: Patient was seen in consultation today for cervical cancer  Referring Physician(s): Gorsuch,Ni  Supervising Physician: Sandi Mariscal  Patient Status: [facility] - Out-pt  History of Present Illness: Sheena Torres is a 53 y.o. female with past medical history of IBS, migraines, ADHD, and cervical cancer who is s/p chemotherapy via Port-A-Cath placed 05/08/18 by Dr. Anselm Pancoast.   Patient has completed therapy.  She no longer has need for her Port-A-Cath.  She desires to have it removed.  She presents to IR today for Port-A-Cath removal at the request of Dr. Alvy Bimler.  She has been NPO.  She does not take blood thinners.   She denies complaints or concerns today including fever, chills, cough, shortness of breath, nausea, abdominal pain, dysuria.  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;  started external beam radiation 05-15-2018   Chemotherapy-induced nausea    Fatigue    Hypomagnesemia    IBS (irritable bowel syndrome)    w/ diarrhea   Migraine    Pancytopenia, acquired ([place])    Sensation of pressure in bladder area    occasional due to radiation therapy    Past Surgical History:  Procedure Laterality Date   BREAST ENHANCEMENT SURGERY Bilateral 2017   w/ implants   CESAREAN SECTION  2000   IR IMAGING GUIDED PORT INSERTION  05/08/2018   OPERATIVE ULTRASOUND N/A 07/02/2018   Procedure: OPERATIVE ULTRASOUND;  Surgeon: Gery Pray, MD;  Location: [facility];  Service: Urology;  Laterality: N/A;   OPERATIVE ULTRASOUND N/A 07/09/2018   Procedure: OPERATIVE ULTRASOUND;  Surgeon: Gery Pray, MD;  Location: [facility];  Service: Urology;  Laterality: N/A;   OPERATIVE ULTRASOUND N/A 07/13/2018   Procedure: OPERATIVE ULTRASOUND;  Surgeon: Gery Pray, MD;  Location: [facility];  Service: Urology;   Laterality: N/A;   OPERATIVE ULTRASOUND N/A 07/21/2018   Procedure: OPERATIVE ULTRASOUND;  Surgeon: Gery Pray, MD;  Location: [facility];  Service: Urology;  Laterality: N/A;   OPERATIVE ULTRASOUND N/A 07/27/2018   Procedure: OPERATIVE ULTRASOUND;  Surgeon: Gery Pray, MD;  Location: [facility];  Service: Urology;  Laterality: N/A;   TANDEM RING INSERTION N/A 07/02/2018   Procedure: TANDEM RING INSERTION;  Surgeon: Gery Pray, MD;  Location: [facility];  Service: Urology;  Laterality: N/A;   TANDEM RING INSERTION N/A 07/09/2018   Procedure: TANDEM RING INSERTION;  Surgeon: Gery Pray, MD;  Location: [facility];  Service: Urology;  Laterality: N/A;   TANDEM RING INSERTION N/A 07/13/2018   Procedure: TANDEM RING INSERTION;  Surgeon: Gery Pray, MD;  Location: [facility];  Service: Urology;  Laterality: N/A;   TANDEM RING INSERTION N/A 07/21/2018   Procedure: TANDEM RING INSERTION;  Surgeon: Gery Pray, MD;  Location: [facility];  Service: Urology;  Laterality: N/A;   TANDEM RING INSERTION N/A 07/27/2018   Procedure: TANDEM RING INSERTION;  Surgeon: Gery Pray, MD;  Location: [facility];  Service: Urology;  Laterality: N/A;    Allergies: Patient has no known allergies.  Medications: Prior to Admission medications   Medication Sig Start Date End Date Taking? Authorizing Provider  acetaminophen (TYLENOL) 500 MG tablet Take 1,000 mg by mouth 3 (three) times daily.    Yes [provider]  Probiotic Product (ALIGN) 4 MG CAPS Take 4  mg by mouth daily.   Yes [provider]  vitamin B-12 (CYANOCOBALAMIN) 500 MCG tablet Take 1,000 mcg by mouth daily.   Yes [provider]  SUMAtriptan (IMITREX) 100 MG tablet Take 1 tablet (100 mg total) by mouth once for 1 dose. May repeat in 2 hours if no relief. Patient taking differently: Take 100 mg by mouth  every 2 (two) hours as needed for migraine. May repeat in 2 hours if no relief. 03/17/17 08/31/18  Terald Sleeper, PA-C  prochlorperazine (COMPAZINE) 10 MG tablet Take 1 tablet (10 mg total) by mouth every 6 (six) hours as needed (Nausea or vomiting). 06/09/18 07/14/18  Gery Pray, MD     Family History  Problem Relation Age of Onset   Heart disease Mother    Diabetes Father    Heart disease Father    Cancer Maternal Grandmother        mouth/jaw cancer    Social History   Socioeconomic History   Marital status: Divorced    Spouse name: Not on file   Number of children: 3   Years of education: Not on file   Highest education level: Not on file  Occupational History   Occupation: car Environmental consultant strain: Not on file   Food insecurity    Worry: Not on file    Inability: Not on file   Transportation needs    Medical: Not on file    Non-medical: Not on file  Tobacco Use   Smoking status: Never Smoker   Smokeless tobacco: Never Used  Substance and Sexual Activity   Alcohol use: Not Currently    Comment: occasional    Drug use: Never   Sexual activity: Not on file  Lifestyle   Physical activity    Days per week: Not on file    Minutes per session: Not on file   Stress: Not on file  Relationships   Social connections    Talks on phone: Not on file    Gets together: Not on file    Attends religious service: Not on file    Active member of club or organization: Not on file    Attends meetings of clubs or organizations: Not on file    Relationship status: Not on file  Other Topics Concern   Not on file  Social History Narrative   Not on file     Review of Systems: A 12 point ROS discussed and pertinent positives are indicated in the HPI above.  All other systems are negative.  Review of Systems  Constitutional: Negative for fatigue and fever.  Respiratory: Negative for cough and shortness of breath.   Cardiovascular:  Negative for chest pain.  Gastrointestinal: Negative for abdominal pain.  Genitourinary: Negative for dysuria.  Musculoskeletal: Negative for back pain.  Psychiatric/Behavioral: Negative for behavioral problems and confusion.    Vital Signs: LMP  (LMP Unknown) Comment: irregular periods/vaginal bleeding due to tumor   Physical Exam Vitals signs and nursing note reviewed.  Constitutional:      Appearance: Normal appearance.  HENT:     Mouth/Throat:     Mouth: Mucous membranes are moist.     Pharynx: Oropharynx is clear.  Neck:     Musculoskeletal: Normal range of motion and neck supple.     Comments: Port-A-Cath in place on the right.  Well-healed Cardiovascular:     Rate and Rhythm: Normal rate and regular rhythm.  Pulmonary:  Effort: Pulmonary effort is normal. No respiratory distress.     Breath sounds: Normal breath sounds.  Abdominal:     General: Abdomen is flat. There is no distension.     Palpations: Abdomen is soft.  Skin:    General: Skin is warm and dry.  Neurological:     General: No focal deficit present.     Mental Status: She is alert and oriented to person, place, and time.  Psychiatric:        Mood and Affect: Mood normal.        Behavior: Behavior normal.        Thought Content: Thought content normal.        Judgment: Judgment normal.      MD Evaluation Airway: WNL Heart: WNL Abdomen: WNL Chest/ Lungs: WNL ASA  Classification: 3 Mallampati/Airway Score: One   Imaging: Nm Pet Image Restag (ps) Skull Base To Thigh  Result Date: 10/27/2018 CLINICAL DATA:  Subsequent treatment strategy for cervical carcinoma. Status post chemotherapy and radiation therapy. EXAM: NUCLEAR MEDICINE PET SKULL BASE TO THIGH TECHNIQUE: 7.3 mCi F-18 FDG was injected intravenously. Full-ring PET imaging was performed from the skull base to thigh after the radiotracer. CT data was obtained and used for attenuation correction and anatomic localization. Fasting blood  glucose: 97 mg/dl COMPARISON:  04/24/2018 FINDINGS: Mediastinal blood-pool activity (background): SUV max = 2.4 Liver activity (reference): SUV max = N/A NECK:  No hypermetabolic lymph nodes or masses. Incidental CT findings:  None. CHEST: No hypermetabolic masses or lymphadenopathy. No suspicious pulmonary nodules seen on CT images. Two tiny pulmonary nodules in the anterior right upper and middle lobes are calcified, consistent with calcified granulomas. Incidental CT findings:  None. ABDOMEN/PELVIS: No abnormal hypermetabolic activity within the liver, pancreas, adrenal glands, or spleen. Previously seen bulky soft tissue mass in the area the cervix has diminished in size, currently measuring approximately 4.4 x 3.3 cm on image 172/4, compared with 5.6 x 4.6 cm at same level on previous study. There is minimal residual FDG uptake at this site with SUV max of 3.0 compared to 20.4 previously. No hypermetabolic lymph nodes in the abdomen or pelvis. Previously seen sub-cm left pelvic sidewall and external iliac lymph nodes are no longer visualized. Incidental CT findings:  None. SKELETON: No focal hypermetabolic bone lesions to suggest skeletal metastasis. Incidental CT findings:  None. IMPRESSION: Near complete resolution of FDG uptake at site of previously seen cervical mass. Resolution of previously seen sub-cm pelvic lymph nodes. No evidence of metabolically-active metastatic disease. Electronically Signed   By: Marlaine Hind M.D.   On: 10/27/2018 11:42    Labs:  CBC: Recent Labs    08/13/18 0938 10/08/18 0902 10/27/18 0738 11/16/18 1024  WBC 3.1* 3.3* 3.1* 2.9*  HGB 10.2* 11.1* 11.4* 11.8*  HCT 30.3* 33.9* 33.8* 35.7*  PLT 216 237 249 247    COAGS: Recent Labs    05/08/18 1016 11/16/18 1024  INR 0.9 0.9    BMP: Recent Labs    06/15/18 0949 06/17/18 0824 06/22/18 0927 06/29/18 0813  NA 138 138 138 137  K 3.8 4.6 4.4 4.5  CL 102 104 103 104  CO2 28 27 26 26   GLUCOSE 106* 87 95  92  BUN 7 9 10 11   CALCIUM 8.8* 8.8* 9.0 8.9  CREATININE 0.78 0.78 0.82 0.79  GFRNONAA >60 >60 >60 >60  GFRAA >60 >60 >60 >60    LIVER FUNCTION TESTS: Recent Labs    06/15/18 0949 06/17/18  [id number] 06/22/18 0927 06/29/18 0813  BILITOT <0.2* 0.3 0.3 0.3  AST 13* 11* 12* 14*  ALT 11 10 12 14   ALKPHOS 77 81 79 67  PROT 6.2* 6.1* 6.5 6.2*  ALBUMIN 3.5 3.4* 3.7 3.5    TUMOR MARKERS: No results for input(s): AFPTM, CEA, CA199, CHROMGRNA in the last 8760 hours.  Assessment and Plan: Patient with past medical history of cervical cancer presents for Port-A-Cath removal at the end of treatment.   Case reviewed by Dr. Pascal Lux who approves patient for procedure.  Patient presents today in their usual state of health.  She has been NPO and is not currently on blood thinners.   Risks and benefits of image guided port-a-catheter placement was discussed with the patient including, but not limited to bleeding, infection, pneumothorax, or fibrin sheath development and need for additional procedures.  All of the patient's questions were answered, patient is agreeable to proceed. Consent signed and in chart.  Thank you for this interesting consult.  I greatly enjoyed meeting Sheena Torres and look forward to participating in their care.  A copy of this report was sent to the requesting provider on this date.  Electronically Signed: Docia Barrier, PA 11/16/2018, 10:45 AM   I spent a total of    25 Minutes in face to face in clinical consultation, greater than 50% of which was counseling/coordinating care for cervical cancer.

## 2018-12-03 ENCOUNTER — Other Ambulatory Visit: Payer: Self-pay

## 2019-02-01 ENCOUNTER — Ambulatory Visit: Payer: Self-pay | Admitting: Radiation Oncology

## 2019-02-11 ENCOUNTER — Ambulatory Visit: Payer: Self-pay | Admitting: Radiation Oncology

## 2019-02-15 ENCOUNTER — Encounter: Payer: Self-pay | Admitting: Radiation Oncology

## 2019-02-15 ENCOUNTER — Other Ambulatory Visit: Payer: Self-pay

## 2019-02-15 ENCOUNTER — Ambulatory Visit: Payer: Self-pay | Attending: Radiation Oncology

## 2019-02-15 ENCOUNTER — Ambulatory Visit
Admission: RE | Admit: 2019-02-15 | Discharge: 2019-02-15 | Disposition: A | Discharge status: Home / Self Care | Payer: Self-pay | Source: Ambulatory Visit | Attending: Radiation Oncology | Admitting: Radiation Oncology

## 2019-02-15 ENCOUNTER — Telehealth: Payer: Self-pay | Admitting: *Deleted

## 2019-02-15 VITALS — BP 131/78 | HR 70 | Temp 98.5°F | Resp 18 | Ht 62.0 in | Wt 149.2 lb

## 2019-02-15 DIAGNOSIS — R300 Dysuria: Secondary | ICD-10-CM

## 2019-02-15 DIAGNOSIS — Z79899 Other long term (current) drug therapy: Secondary | ICD-10-CM | POA: Insufficient documentation

## 2019-02-15 DIAGNOSIS — Z8541 Personal history of malignant neoplasm of cervix uteri: Secondary | ICD-10-CM | POA: Insufficient documentation

## 2019-02-15 DIAGNOSIS — Z923 Personal history of irradiation: Secondary | ICD-10-CM | POA: Insufficient documentation

## 2019-02-15 DIAGNOSIS — C530 Malignant neoplasm of endocervix: Secondary | ICD-10-CM

## 2019-02-15 LAB — URINALYSIS, COMPLETE (UACMP) WITH MICROSCOPIC
Bacteria, UA: NONE SEEN
Bilirubin Urine: NEGATIVE
Glucose, UA: NEGATIVE mg/dL
Hgb urine dipstick: NEGATIVE
Ketones, ur: NEGATIVE mg/dL
Nitrite: NEGATIVE
Protein, ur: NEGATIVE mg/dL
Specific Gravity, Urine: 1.003 — ABNORMAL LOW (ref 1.005–1.030)
pH: 6 (ref 5.0–8.0)

## 2019-02-15 NOTE — Progress Notes (Addendum)
Radiation Oncology         [phone] ________________________________  Name: Sheena Torres MRN: [id number]  Date: 02/15/2019  DOB: 1965-08-21  Follow-Up Visit Note  CC: Terald Sleeper, PA-C  Everitt Amber, MD    ICD-10-CM   1. Dysuria  R30.0 Urinalysis, Complete w Microscopic    Urine culture  2. 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  Radiation treatment dates:    1. 05/14/2018 - 06/24/2018 2. 5/21, 5/28, 6/1, 6/9, 07/27/2018  Site/dose:    1.         A. pelvsi / 45 Gy in 25 fractions              B. Parametrial  boost / 9 Gy in 5 fractions 2.         Cervix, Tandem and Ring System / 27.5 Gy in 5 fractions  Narrative:  The patient returns today for routine follow-up. She is doing well overall.   Since last visit on 08/31/2018, the patient followed-up with Dr. Alvy Bimler on 10/08/2018, during which time her pancytopenia was improving and she was asymptomatic. Dr. Alvy Bimler recommended observation only.  PET scan on 10/27/2018 showed near complete resolution of FDG uptake at site of previously seen cervical mass. It also showed resolution of previously seen sub-cm pelvic lymph nodes. No evidence of metabolically-active metastatic disease.  Patient also followed-up with Dr. Denman George on 10/30/2018, during which time she was doing well.  On review of systems, she reports no vaginal bleeding.  She is using her vaginal dilator limited basis recommended to use this more regular basis. She denies pelvic pain or abdominal bloating.  She has noticed some lower pelvic pressure and feels she may have a urinary tract infection.  Sample submitted today.                 ALLERGIES:  has No Known Allergies.  Meds: Current Outpatient Medications  Medication Sig Dispense Refill  . acetaminophen (TYLENOL) 500 MG tablet Take 1,000 mg by mouth 3 (three) times  daily.     . Probiotic Product (ALIGN) 4 MG CAPS Take 4 mg by mouth daily.    . SUMAtriptan (IMITREX) 100 MG tablet Take 1 tablet (100 mg total) by mouth once for 1 dose. May repeat in 2 hours if no relief. (Patient taking differently: Take 100 mg by mouth every 2 (two) hours as needed for migraine. May repeat in 2 hours if no relief.) 10 tablet 11  . vitamin B-12 (CYANOCOBALAMIN) 500 MCG tablet Take 1,000 mcg by mouth daily.     No current facility-administered medications for this encounter.    Physical Findings: The patient is in no acute distress. Patient is alert and oriented.  height is 5\' 2"  (1.575 m) and weight is 149 lb 4 oz (67.7 kg). Her temporal temperature is 98.5 F (36.9 C). Her blood pressure is 131/78 and her pulse is 70. Her respiration is 18 and oxygen saturation is 100%. .  No significant changes. Lungs are clear to auscultation bilaterally. Heart has regular rate and rhythm. No palpable cervical, supraclavicular, or axillary adenopathy. Abdomen soft, non-tender, normal bowel sounds.  On pelvic examination the external genitalia were unremarkable. A speculum exam was performed. There are no mucosal lesions noted in the vaginal vault. On bimanual and rectovaginal examination there were no  pelvic masses appreciated.  The cervical os is flush with the upper vaginal area.  Mild adhesions easily broken with digital exam.  Lab Findings: Lab Results  Component Value Date   WBC 2.9 (L) 11/16/2018   HGB 11.8 (L) 11/16/2018   HCT 35.7 (L) 11/16/2018   MCV 98.1 11/16/2018   PLT 247 11/16/2018    Radiographic Findings: No results found.  Impression: No evidence of recurrence on exam.  Good results on recent PET scan as above.  Plan: Patient will follow-up with radiation oncology in six months.  she will see Dr. Denman George in 3 months ____________________________________   Blair Promise, PhD, MD  This document serves as a record of services personally performed by Gery Pray, MD. It was created on his behalf by Clerance Lav, a trained medical scribe. The creation of this record is based on the scribe's personal observations and the provider's statements to them. This document has been checked and approved by the attending provider.

## 2019-02-15 NOTE — Patient Instructions (Signed)
Coronavirus (COVID-19) Are you at risk?  Are you at risk for the Coronavirus (COVID-19)?  To be considered HIGH RISK for Coronavirus (COVID-19), you have to meet the following criteria:  . Traveled to [place], [place], [place], [place] or [place]; or in the [place] to [place], [place], [place], or [place]; and have fever, cough, and shortness of breath within the last 2 weeks of travel OR . Been in close contact with a person diagnosed with COVID-19 within the last 2 weeks and have fever, cough, and shortness of breath . IF YOU DO NOT MEET THESE CRITERIA, YOU ARE CONSIDERED LOW RISK FOR COVID-19.  What to do if you are HIGH RISK for COVID-19?  . If you are having a medical emergency, call 911. . Seek medical care right away. Before you go to a doctor's office, urgent care or emergency department, call ahead and tell them about your recent travel, contact with someone diagnosed with COVID-19, and your symptoms. You should receive instructions from your physician's office regarding next steps of care.  . When you arrive at healthcare provider, tell the healthcare staff immediately you have returned from visiting [place], [place], [place], [place] or [place]; or traveled in the [place] to [place], [place], [place], or [place]; in the last two weeks or you have been in close contact with a person diagnosed with COVID-19 in the last 2 weeks.   . Tell the health care staff about your symptoms: fever, cough and shortness of breath. . After you have been seen by a medical provider, you will be either: o Tested for (COVID-19) and discharged home on quarantine except to seek medical care if symptoms worsen, and asked to  - Stay home and avoid contact with others until you get your results (4-5 days)  - Avoid travel on public transportation if possible (such as bus, train, or airplane) or o Sent to the Emergency Department by EMS for evaluation, COVID-19 testing, and possible  admission depending on your condition and test results.  What to do if you are LOW RISK for COVID-19?  Reduce your risk of any infection by using the same precautions used for avoiding the common cold or flu:  . Wash your hands often with soap and warm water for at least 20 seconds.  If soap and water are not readily available, use an alcohol-based hand sanitizer with at least 60% alcohol.  . If coughing or sneezing, cover your mouth and nose by coughing or sneezing into the elbow areas of your shirt or coat, into a tissue or into your sleeve (not your hands). . Avoid shaking hands with others and consider head nods or verbal greetings only. . Avoid touching your eyes, nose, or mouth with unwashed hands.  . Avoid close contact with people who are sick. . Avoid places or events with large numbers of people in one location, like concerts or sporting events. . Carefully consider travel plans you have or are making. . If you are planning any travel outside or inside the [place], visit the CDC's Travelers' Health webpage for the latest health notices. . If you have some symptoms but not all symptoms, continue to monitor at home and seek medical attention if your symptoms worsen. . If you are having a medical emergency, call 911.   ADDITIONAL HEALTHCARE OPTIONS FOR PATIENTS  [facility] / e-Visit: [email or web address]         [facility]: [phone]  [facility]: [phone]                   [facility]: [phone]   

## 2019-02-15 NOTE — Progress Notes (Signed)
Pt presents today for f/u with Dr. Sondra Come. Pt reports that for the past couple mornings, she has had tenderness with the sensation that she's held her urine for an extended time. Pt also reports that she also has that sensation during the day at times too. Pt also reports occasional bouts of diarrhea in the past month, the last being this am. Pt denies dysuria/hematuria. Pt denies vaginal bleeding/discharge. Pt denies rectal bleeding. Pt denies abdominal bloating, N/V. Pt reports that she has been drinking more soft drinks than water in the past month and is unsure if that is the cause of the tenderness in low pelvic area.  BP 131/78 (BP Location: Left Arm, Patient Position: Sitting)   Pulse 70   Temp 98.5 F (36.9 C) (Temporal)   Resp 18   Ht 5\' 2"  (1.575 m)   Wt 149 lb 4 oz (67.7 kg)   LMP  (LMP Unknown) Comment: irregular periods/vaginal bleeding due to tumor   SpO2 100%   BMI 27.30 kg/m   Wt Readings from Last 3 Encounters:  02/15/19 149 lb 4 oz (67.7 kg)  10/30/18 151 lb (68.5 kg)  10/08/18 152 lb 3.2 oz (69 kg)   Loma Sousa, RN BSN

## 2019-02-15 NOTE — Telephone Encounter (Signed)
CALLED PATIENT TO ASK ABOUT COMING IN @ 3 PM INSTEAD OF 4 PM FOR FU, PATIENT AGREED TO NEW TIME

## 2019-02-17 LAB — URINE CULTURE: Culture: 10000 — AB

## 2019-03-16 ENCOUNTER — Telehealth: Payer: Self-pay | Admitting: *Deleted

## 2019-03-16 NOTE — Telephone Encounter (Signed)
CALLED PATIENT TO INFORM OF FU APPT. WITH DR. Denman George ON 05-25-19 - ARRIVAL TIME- 1 PM, LVM FOR A RETURN CALL

## 2019-04-30 ENCOUNTER — Ambulatory Visit (INDEPENDENT_AMBULATORY_CARE_PROVIDER_SITE_OTHER): Payer: Self-pay | Admitting: Nurse Practitioner

## 2019-04-30 ENCOUNTER — Other Ambulatory Visit: Payer: Self-pay

## 2019-04-30 ENCOUNTER — Telehealth: Payer: Self-pay

## 2019-04-30 ENCOUNTER — Telehealth: Payer: Self-pay | Admitting: Nurse Practitioner

## 2019-04-30 ENCOUNTER — Encounter: Payer: Self-pay | Admitting: Nurse Practitioner

## 2019-04-30 VITALS — BP 129/78 | HR 77 | Temp 98.4°F | Resp 20 | Ht 62.0 in | Wt 146.0 lb

## 2019-04-30 DIAGNOSIS — R350 Frequency of micturition: Secondary | ICD-10-CM

## 2019-04-30 LAB — URINALYSIS, COMPLETE
Bilirubin, UA: NEGATIVE
Glucose, UA: NEGATIVE
Ketones, UA: NEGATIVE
Leukocytes,UA: NEGATIVE
Nitrite, UA: NEGATIVE
Protein,UA: NEGATIVE
Specific Gravity, UA: 1.01 (ref 1.005–1.030)
Urobilinogen, Ur: 0.2 mg/dL (ref 0.2–1.0)
pH, UA: 6 (ref 5.0–7.5)

## 2019-04-30 LAB — MICROSCOPIC EXAMINATION: Renal Epithel, UA: NONE SEEN /hpf

## 2019-04-30 MED ORDER — MIRABEGRON ER 25 MG PO TB24: 25.0000 mg | ORAL_TABLET | Freq: Every day | ORAL | 3 refills | Status: DC

## 2019-04-30 MED ORDER — SUMATRIPTAN SUCCINATE 100 MG PO TABS: 100.0000 mg | ORAL_TABLET | Freq: Once | ORAL | 11 refills | Status: DC

## 2019-04-30 MED ORDER — SOLIFENACIN SUCCINATE 10 MG PO TABS: 10.0000 mg | ORAL_TABLET | Freq: Every day | ORAL | 1 refills | Status: DC

## 2019-04-30 NOTE — Progress Notes (Signed)
Subjective:    Patient ID: Sheena Torres, female    DOB: [birth year], 54 y.o.   MRN: [id number]   Chief Complaint: Urinary Frequency   HPI Patient comes in today c/o  frequency and urgency of urine. Started several days ago. She has been getting treatments for ovarian cancer and had urine checked several weeks ago at oncologist but they never gave her the results. She called them yesterday and they told her she had mild infection and they were sorry they did not let her know. She comes in today wanting to hav eurine checked   Review of Systems  Constitutional: Negative for diaphoresis.  Eyes: Negative for pain.  Respiratory: Negative for shortness of breath.   Cardiovascular: Negative for chest pain, palpitations and leg swelling.  Gastrointestinal: Negative for abdominal pain.  Endocrine: Negative for polydipsia.  Skin: Negative for rash.  Neurological: Negative for dizziness, weakness and headaches.  Hematological: Does not bruise/bleed easily.  All other systems reviewed and are negative.      Objective:   Physical Exam Vitals and nursing note reviewed.  Constitutional:      General: She is not in acute distress.    Appearance: Normal appearance. She is well-developed.  HENT:     Head: Normocephalic.     Nose: Nose normal.  Eyes:     Pupils: Pupils are equal, round, and reactive to light.  Neck:     Vascular: No carotid bruit or JVD.  Cardiovascular:     Rate and Rhythm: Normal rate and regular rhythm.     Heart sounds: Normal heart sounds.  Pulmonary:     Effort: Pulmonary effort is normal. No respiratory distress.     Breath sounds: Normal breath sounds. No wheezing or rales.  Chest:     Chest wall: No tenderness.  Abdominal:     General: Bowel sounds are normal. There is no distension or abdominal bruit.     Palpations: Abdomen is soft. There is no hepatomegaly, splenomegaly, mass or pulsatile mass.     Tenderness: There is abdominal tenderness (mild left  lower quadrant).  Musculoskeletal:        General: Normal range of motion.     Cervical back: Normal range of motion and neck supple.  Lymphadenopathy:     Cervical: No cervical adenopathy.  Skin:    General: Skin is warm and dry.  Neurological:     Mental Status: She is alert and oriented to person, place, and time.     Deep Tendon Reflexes: Reflexes are normal and symmetric.  Psychiatric:        Behavior: Behavior normal.        Thought Content: Thought content normal.        Judgment: Judgment normal.    BP 129/78   Pulse 77   Temp 98.4 F (36.9 C) (Temporal)   Resp 20   Ht 5\' 2"  (1.575 m)   Wt 146 lb (66.2 kg)   LMP  (LMP Unknown) Comment: irregular periods/vaginal bleeding due to tumor   SpO2 100%   BMI 26.70 kg/m   Urine- trace of blod- otherwise normal      Assessment & Plan:  Sheena Torres in today with chief complaint of Urinary Frequency   1. Frequent urination Side effects discussed Let oncology know has started on meds - Urinalysis, Complete - mirabegron ER (MYRBETRIQ) 25 MG TB24 tablet; Take 1 tablet (25 mg total) by mouth daily.  Dispense: 30 tablet; Refill: 3  *  renewed imitrex prescription today  The above assessment and management plan was discussed with the patient. The patient verbalized understanding of and has agreed to the management plan. Patient is aware to call the clinic if symptoms persist or worsen. Patient is aware when to return to the clinic for a follow-up visit. Patient educated on when it is appropriate to go to the emergency department.   Mary-Margaret Hassell Done, FNP

## 2019-04-30 NOTE — Telephone Encounter (Signed)
Pt called back to get an update on whether a cheaper brand was sent to pharmacy yet so that she can go ahead and pick up. Pt says she was recommended by pharmacist at [facility] to see if the generic brand of Vesicare could be sent in for her because it was much cheaper.

## 2019-04-30 NOTE — Telephone Encounter (Signed)
  Medication Request  04/30/2019  What is the name of the medication? vesicare  Have you contacted your pharmacy to request a refill? yes  Which pharmacy would you like this sent to? The drug store   Patient notified that their request is being sent to the clinical staff for review and that they should receive a call once it is complete. If they do not receive a call within 24 hours they can check with their pharmacy or our office.  Pt was prescribe mirabegron for her bladder  They do not have that in generic it will cost her around 400 dollars she was told to get vesicare for the cost of 40 dollars... please have mary to call the drug store to have it switched

## 2019-04-30 NOTE — Telephone Encounter (Signed)
Please advise on changing medication to a cheaper one.

## 2019-04-30 NOTE — Patient Instructions (Signed)
Urinary Frequency, Adult Urinary frequency means urinating more often than usual. You may urinate every 1-2 hours even though you drink a normal amount of fluid and do not have a bladder infection or condition. Although you urinate more often than normal, the total amount of urine produced in a day is normal. With urinary frequency, you may have an urgent need to urinate often. The stress and anxiety of needing to find a bathroom quickly can make this urge worse. This condition may go away on its own or you may need treatment at home. Home treatment may include bladder training, exercises, taking medicines, or making changes to your diet. Follow these instructions at home: Bladder health   Keep a bladder diary if told by your health care provider. Keep track of: ? What you eat and drink. ? How often you urinate. ? How much you urinate.  Follow a bladder training program if told by your health care provider. This may include: ? Learning to delay going to the bathroom. ? Double urinating (voiding). This helps if you are not completely emptying your bladder. ? Scheduled voiding.  Do Kegel exercises as told by your health care provider. Kegel exercises strengthen the muscles that help control urination, which may help the condition. Eating and drinking  If told by your health care provider, make diet changes, such as: ? Avoiding caffeine. ? Drinking fewer fluids, especially alcohol. ? Not drinking in the evening. ? Avoiding foods or drinks that may irritate the bladder. These include coffee, tea, soda, artificial sweeteners, citrus, tomato-based foods, and chocolate. ? Eating foods that help prevent or ease constipation. Constipation can make this condition worse. Your health care provider may recommend that you:  Drink enough fluid to keep your urine pale yellow.  Take over-the-counter or prescription medicines.  Eat foods that are high in fiber, such as beans, whole grains, and fresh  fruits and vegetables.  Limit foods that are high in fat and processed sugars, such as fried or sweet foods. General instructions  Take over-the-counter and prescription medicines only as told by your health care provider.  Keep all follow-up visits as told by your health care provider. This is important. Contact a health care provider if:  You start urinating more often.  You feel pain or irritation when you urinate.  You notice blood in your urine.  Your urine looks cloudy.  You develop a fever.  You begin vomiting. Get help right away if:  You are unable to urinate. Summary  Urinary frequency means urinating more often than usual. With urinary frequency, you may urinate every 1-2 hours even though you drink a normal amount of fluid and do not have a bladder infection or other bladder condition.  Your health care provider may recommend that you keep a bladder diary, follow a bladder training program, or make dietary changes.  If told by your health care provider, do Kegel exercises to strengthen the muscles that help control urination.  Take over-the-counter and prescription medicines only as told by your health care provider.  Contact a health care provider if your symptoms do not improve or get worse. This information is not intended to replace advice given to you by your health care provider. Make sure you discuss any questions you have with your health care provider. Document Revised: 08/07/2017 Document Reviewed: 08/07/2017 Elsevier Patient Education  2020 [employer]vier Inc.  

## 2019-04-30 NOTE — Telephone Encounter (Signed)
Returned pt's VM re: urinary symptoms that have recurred recently. Conveyed to pt that Dr. Sondra Come is not in the office on Fridays. Pt states she will present to local urgent care for testing/treatment. Pt would like to meet with Dr. Sondra Come to discuss ongoing bladder/urinary s/s. Offered pt work-in appt. Pt accepted. Loma Sousa, RN BSN

## 2019-05-04 ENCOUNTER — Telehealth: Payer: Self-pay | Admitting: *Deleted

## 2019-05-04 ENCOUNTER — Ambulatory Visit
Admission: RE | Admit: 2019-05-04 | Discharge: 2019-05-04 | Disposition: A | Discharge status: Home / Self Care | Payer: Medicaid Other | Source: Ambulatory Visit | Attending: Radiation Oncology | Admitting: Radiation Oncology

## 2019-05-04 ENCOUNTER — Other Ambulatory Visit: Payer: Self-pay

## 2019-05-04 ENCOUNTER — Encounter: Payer: Self-pay | Admitting: Radiation Oncology

## 2019-05-04 VITALS — BP 116/83 | HR 75 | Temp 98.0°F | Resp 18 | Wt 147.4 lb

## 2019-05-04 DIAGNOSIS — Z79899 Other long term (current) drug therapy: Secondary | ICD-10-CM | POA: Diagnosis not present

## 2019-05-04 DIAGNOSIS — Z923 Personal history of irradiation: Secondary | ICD-10-CM | POA: Diagnosis not present

## 2019-05-04 DIAGNOSIS — R300 Dysuria: Secondary | ICD-10-CM

## 2019-05-04 DIAGNOSIS — C530 Malignant neoplasm of endocervix: Secondary | ICD-10-CM

## 2019-05-04 DIAGNOSIS — Z8541 Personal history of malignant neoplasm of cervix uteri: Secondary | ICD-10-CM | POA: Diagnosis present

## 2019-05-04 LAB — CMP (CANCER CENTER ONLY)
ALT: 12 U/L (ref 0–44)
AST: 18 U/L (ref 15–41)
Albumin: 3.9 g/dL (ref 3.5–5.0)
Alkaline Phosphatase: 91 U/L (ref 38–126)
Anion gap: 9 (ref 5–15)
BUN: 15 mg/dL (ref 6–20)
CO2: 27 mmol/L (ref 22–32)
Calcium: 9.5 mg/dL (ref 8.9–10.3)
Chloride: 104 mmol/L (ref 98–111)
Creatinine: 1.23 mg/dL — ABNORMAL HIGH (ref 0.44–1.00)
GFR, Est AFR Am: 58 mL/min — ABNORMAL LOW (ref >60)
GFR, Estimated: 50 mL/min — ABNORMAL LOW (ref >60)
Glucose, Bld: 94 mg/dL (ref 70–99)
Potassium: 4.1 mmol/L (ref 3.5–5.1)
Sodium: 140 mmol/L (ref 135–145)
Total Bilirubin: 0.3 mg/dL (ref 0.3–1.2)
Total Protein: 7 g/dL (ref 6.5–8.1)

## 2019-05-04 LAB — URINALYSIS, COMPLETE (UACMP) WITH MICROSCOPIC
Bacteria, UA: NONE SEEN
Bilirubin Urine: NEGATIVE
Glucose, UA: NEGATIVE mg/dL
Hgb urine dipstick: NEGATIVE
Ketones, ur: NEGATIVE mg/dL
Nitrite: NEGATIVE
Protein, ur: NEGATIVE mg/dL
Specific Gravity, Urine: 1.005 (ref 1.005–1.030)
pH: 7 (ref 5.0–8.0)

## 2019-05-04 LAB — CBC WITH DIFFERENTIAL (CANCER CENTER ONLY)
Abs Immature Granulocytes: 0.01 10*3/uL (ref 0.00–0.07)
Basophils Absolute: 0 10*3/uL (ref 0.0–0.1)
Basophils Relative: 1 %
Eosinophils Absolute: 0.3 10*3/uL (ref 0.0–0.5)
Eosinophils Relative: 7 %
HCT: 32.7 % — ABNORMAL LOW (ref 36.0–46.0)
Hemoglobin: 10.7 g/dL — ABNORMAL LOW (ref 12.0–15.0)
Immature Granulocytes: 0 %
Lymphocytes Relative: 14 %
Lymphs Abs: 0.6 10*3/uL — ABNORMAL LOW (ref 0.7–4.0)
MCH: 31.4 pg (ref 26.0–34.0)
MCHC: 32.7 g/dL (ref 30.0–36.0)
MCV: 95.9 fL (ref 80.0–100.0)
Monocytes Absolute: 0.3 10*3/uL (ref 0.1–1.0)
Monocytes Relative: 8 %
Neutro Abs: 2.9 10*3/uL (ref 1.7–7.7)
Neutrophils Relative %: 70 %
Platelet Count: 297 10*3/uL (ref 150–400)
RBC: 3.41 MIL/uL — ABNORMAL LOW (ref 3.87–5.11)
RDW: 12.4 % (ref 11.5–15.5)
WBC Count: 4.1 10*3/uL (ref 4.0–10.5)
nRBC: 0 % (ref 0.0–0.2)

## 2019-05-04 NOTE — Progress Notes (Signed)
Ms. Howington presents today for work in appt with Dr. Sondra Come. Pt had called late last week and requested work in appt to discuss the effects XRT had on her bladder. As Dr. Sondra Come was not in office on day of call (Friday), pt saw PCP and was prescribed generic vesicare. UA and culture was negative for UTI at PCP's office.   Pt reports urinary urgency and frequency. Pt reports the sensation is that she has "been holding it for so long". Pt denies dysuria/visible hematuria. Pt denies vaginal bleeding/discharge. Pt c/o "severe cramp" on the LEFT pelvic/groin. Pt denies rectal bleeding, diarrhea/constipation. Pt denies abdominal bloating, N/V.   BP 116/83 (BP Location: Left Arm, Patient Position: Sitting, Cuff Size: Normal)   Pulse 75   Temp 98 F (36.7 C)   Resp 18   Wt 147 lb 6.4 oz (66.9 kg)   LMP  (LMP Unknown) Comment: irregular periods/vaginal bleeding due to tumor   SpO2 100%   BMI 26.96 kg/m   Wt Readings from Last 3 Encounters:  05/04/19 147 lb 6.4 oz (66.9 kg)  04/30/19 146 lb (66.2 kg)  02/15/19 149 lb 4 oz (67.7 kg)   Loma Sousa, RN BSN

## 2019-05-04 NOTE — Patient Instructions (Signed)
Coronavirus (COVID-19) Are you at risk?  Are you at risk for the Coronavirus (COVID-19)?  To be considered HIGH RISK for Coronavirus (COVID-19), you have to meet the following criteria:  . Traveled to [place], [place], [place], [place] or [place]; or in the [place] to [place], [place], [place], or [place]; and have fever, cough, and shortness of breath within the last 2 weeks of travel OR . Been in close contact with a person diagnosed with COVID-19 within the last 2 weeks and have fever, cough, and shortness of breath . IF YOU DO NOT MEET THESE CRITERIA, YOU ARE CONSIDERED LOW RISK FOR COVID-19.  What to do if you are HIGH RISK for COVID-19?  . If you are having a medical emergency, call 911. . Seek medical care right away. Before you go to a doctor's office, urgent care or emergency department, call ahead and tell them about your recent travel, contact with someone diagnosed with COVID-19, and your symptoms. You should receive instructions from your physician's office regarding next steps of care.  . When you arrive at healthcare provider, tell the healthcare staff immediately you have returned from visiting [place], [place], [place], [place] or [place]; or traveled in the [place] to [place], [place], [place], or [place]; in the last two weeks or you have been in close contact with a person diagnosed with COVID-19 in the last 2 weeks.   . Tell the health care staff about your symptoms: fever, cough and shortness of breath. . After you have been seen by a medical provider, you will be either: o Tested for (COVID-19) and discharged home on quarantine except to seek medical care if symptoms worsen, and asked to  - Stay home and avoid contact with others until you get your results (4-5 days)  - Avoid travel on public transportation if possible (such as bus, train, or airplane) or o Sent to the Emergency Department by EMS for evaluation, COVID-19 testing, and possible  admission depending on your condition and test results.  What to do if you are LOW RISK for COVID-19?  Reduce your risk of any infection by using the same precautions used for avoiding the common cold or flu:  . Wash your hands often with soap and warm water for at least 20 seconds.  If soap and water are not readily available, use an alcohol-based hand sanitizer with at least 60% alcohol.  . If coughing or sneezing, cover your mouth and nose by coughing or sneezing into the elbow areas of your shirt or coat, into a tissue or into your sleeve (not your hands). . Avoid shaking hands with others and consider head nods or verbal greetings only. . Avoid touching your eyes, nose, or mouth with unwashed hands.  . Avoid close contact with people who are sick. . Avoid places or events with large numbers of people in one location, like concerts or sporting events. . Carefully consider travel plans you have or are making. . If you are planning any travel outside or inside the [place], visit the CDC's Travelers' Health webpage for the latest health notices. . If you have some symptoms but not all symptoms, continue to monitor at home and seek medical attention if your symptoms worsen. . If you are having a medical emergency, call 911.   ADDITIONAL HEALTHCARE OPTIONS FOR PATIENTS  [facility] / e-Visit: [email or web address]         [facility]: [phone]  [facility]: [phone]                   [facility]: [phone]   

## 2019-05-04 NOTE — Progress Notes (Signed)
Radiation Oncology         (336) [phone] ________________________________  Name: Sheena Torres MRN: [id number]  Date: 05/04/2019  DOB: 1965/08/01  Follow-Up Visit Note  CC: Terald Sleeper, PA-C  Everitt Amber, MD    ICD-10-CM   1. Dysuria  R30.0 Urinalysis, Complete w Microscopic    Urine culture  2. 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  Radiation treatment dates:     1. 05/14/2018 - 06/24/2018 2. 07/02/2018, 07/09/2018, 07/13/2018, 07/21/2018, 07/27/2018  Site/dose:    1.         A. pelvis / 45 Gy in 25 fractions              B. Parametrial  boost / 9 Gy in 5 fractions 2.         Cervix, Tandem and Ring System / 27.5 Gy in 5 fractions  Narrative: The patient returns today for work-in follow-up. Interval history is significant for urinary symptoms and left-sided pelvic pain which seems to be associated with urination. She was prescribed generic Vesicare by her PCP last week. UA and culture were negative for UTI.  On review of systems, she reports urinary urgency, urinary frequency, and left-sided pelvic/groin cramping. She denies dysuria, hematuria, vaginal bleeding/discharge, rectal bleeding, diarrhea, constipation, abdominal bloating, nausea, and vomiting.  She also notices swelling in the left groin region.  With pain and discomfort seem to be worsening with urination but improved after bladder emptying.  She has been inconsistent with using her vaginal dilator.                 ALLERGIES:  has No Known Allergies.  Meds: Current Outpatient Medications  Medication Sig Dispense Refill  . acetaminophen (TYLENOL) 500 MG tablet Take 1,000 mg by mouth 3 (three) times daily.     . Probiotic Product (ALIGN) 4 MG CAPS Take 4 mg by mouth daily.    . solifenacin  (VESICARE) 10 MG tablet Take 1 tablet (10 mg total) by mouth daily. 30 tablet 1  . SUMAtriptan (IMITREX) 100 MG tablet Take 1 tablet (100 mg total) by mouth once for 1 dose. May repeat in 2 hours if no relief. 10 tablet 11  . Turmeric (QC TUMERIC COMPLEX PO) Take by mouth.    . vitamin B-12 (CYANOCOBALAMIN) 500 MCG tablet Take 1,000 mcg by mouth daily.     No current facility-administered medications for this encounter.    Physical Findings: The patient is in no acute distress. Patient is alert and oriented.  weight is 147 lb 6.4 oz (66.9 kg). Her temperature is 98 F (36.7 C). Her blood pressure is 116/83 and her pulse is 75. Her respiration is 18 and oxygen saturation is 100%.  No significant changes. Lungs are clear to auscultation bilaterally. Heart has regular rate and rhythm. No palpable cervical, supraclavicular, or axillary adenopathy. Abdomen soft, non-tender, normal bowel sounds.  No inguinal adenopathy appreciated. When the Patient stands there is laxity noted in the left groin region with coughing.  No obvious hernia is palpable.  On pelvic examination the external genitalia were unremarkable. A speculum exam was performed. There are no mucosal lesions noted in the vaginal vault.  Radiation changes noted in the proximal vagina.  The cervical os is  difficult to see due to agglutination. on bimanual and rectovaginal examination there were no pelvic masses appreciated.  Possible rectocele.  Lab Findings: Lab Results  Component Value Date   WBC 4.1 05/04/2019   HGB 10.7 (L) 05/04/2019   HCT 32.7 (L) 05/04/2019   MCV 95.9 05/04/2019   PLT 297 05/04/2019    Radiographic Findings: No results found.  Impression: No evidence of recurrence on clinical exam today.  Exam is somewhat concerning for possible recurrence and will proceed with imaging..  We will also repeat urinalysis culture and sensitivity given the patient's persistent symptoms.  Plan: Assuming scans are unremarkable  patient will follow up with Dr. Denman George in June and then follow-up with radiation oncology in September.  She will be due for a Pap smear in June of this year. ____________________________________   Blair Promise, PhD, MD  This document serves as a record of services personally performed by Gery Pray, MD. It was created on his behalf by Clerance Lav, a trained medical scribe. The creation of this record is based on the scribe's personal observations and the provider's statements to them. This document has been checked and approved by the attending provider.

## 2019-05-04 NOTE — Telephone Encounter (Signed)
CALLED PATIENT TO INFORM OF FU WITH DR. Denman George ON 08-10-19 - ARRIVAL TIME- 1 PM, SPOKE WITH PATIENT AND SHE IS AWARE OF THIS APPT.

## 2019-05-05 ENCOUNTER — Telehealth: Payer: Self-pay

## 2019-05-05 LAB — URINE CULTURE: Culture: <10000 — AB

## 2019-05-05 NOTE — Telephone Encounter (Signed)
Contacted pt to convey that urine labs are normal. Per Dr. Sondra Come, Cr slightly elevated and he requests that pt increase water intake. Pt verbalized understanding and agreement. Loma Sousa, RN BSN

## 2019-05-13 ENCOUNTER — Ambulatory Visit (HOSPITAL_COMMUNITY)
Admission: RE | Admit: 2019-05-13 | Discharge: 2019-05-13 | Disposition: A | Discharge status: Home / Self Care | Payer: Medicaid Other | Source: Ambulatory Visit | Attending: Radiation Oncology | Admitting: Radiation Oncology

## 2019-05-13 ENCOUNTER — Encounter (HOSPITAL_COMMUNITY): Payer: Self-pay

## 2019-05-13 ENCOUNTER — Other Ambulatory Visit: Payer: Self-pay | Admitting: Radiation Oncology

## 2019-05-13 ENCOUNTER — Ambulatory Visit
Admission: RE | Admit: 2019-05-13 | Discharge: 2019-05-13 | Disposition: A | Discharge status: Home / Self Care | Payer: Self-pay | Source: Ambulatory Visit | Attending: Radiation Oncology | Admitting: Radiation Oncology

## 2019-05-13 ENCOUNTER — Other Ambulatory Visit: Payer: Self-pay

## 2019-05-13 ENCOUNTER — Encounter: Payer: Self-pay | Admitting: Oncology

## 2019-05-13 VITALS — BP 136/92 | HR 80 | Temp 98.5°F | Resp 20 | Ht 62.0 in | Wt 149.6 lb

## 2019-05-13 DIAGNOSIS — C530 Malignant neoplasm of endocervix: Secondary | ICD-10-CM | POA: Diagnosis present

## 2019-05-13 MED ORDER — OXYCODONE-ACETAMINOPHEN 5-325 MG PO TABS: 1.0000 | ORAL_TABLET | Freq: Four times a day (QID) | ORAL | 0 refills | Status: DC | PRN

## 2019-05-13 MED ORDER — SODIUM CHLORIDE (PF) 0.9 % IJ SOLN
INTRAMUSCULAR | Status: AC
  Filled 2019-05-13: qty 50

## 2019-05-13 MED ORDER — IOHEXOL 300 MG/ML  SOLN
100.0000 mL | Freq: Once | INTRAMUSCULAR | Status: AC | PRN
  Administered 2019-05-13: 08:00:00 100 mL via INTRAVENOUS

## 2019-05-13 NOTE — Progress Notes (Signed)
Radiation Oncology         (336) [phone] ________________________________  Name: Sheena Torres MRN: [id number]  Date: 05/13/2019  DOB: [birth year]  Follow-Up Visit Note  CC: Terald Sleeper, PA-C  Terald Sleeper, PA-C    ICD-10-CM   1. 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 05/14/2018 - 06/24/2018 2. 07/02/2018, 07/09/2018, 07/13/2018, 07/21/2018, 07/27/2018  Site/dose:    1.         A. pelvis / 45 Gy in 25 fractions              B. Parametrial  boost / 9 Gy in 5 fractions 2.         Cervix, Tandem and Ring System / 27.5 Gy in 5 fractions  Narrative: The patient returns today for work-in follow-up. Interval history is significant for urinary symptoms and left-sided pelvic pain which seems to be associated with urination. She was prescribed generic Vesicare by her PCP last week. UA and culture were negative for UTI.  On review of systems, she reports urinary urgency, urinary frequency, and left-sided pelvic/groin cramping. She denies dysuria, hematuria, vaginal bleeding/discharge, rectal bleeding, diarrhea, constipation, abdominal bloating, nausea, and vomiting.  She also notices swelling in the left groin region.  With pain and discomfort seem to be worsening with urination but improved after bladder emptying.  She has been inconsistent with using her vaginal dilator.  Since the patient's last follow-up she completed her CT scan of the abdomen and pelvis for review of these above symptoms.  Unfortunately scans show a significant recurrence in the left pelvis as well as retroperitoneal adenopathy.  This is likely causing her symptoms.  I called patient earlier today to report this information.  She wished to come in to review her scans along with one of her sons.  Patient is expectedly upset about these findings.  We reviewed the  images carefully and she has a better understanding of the  recurrence.  I discussed with her that she will be seen by medical oncology in the near future.  She will also be seen by urology for consideration for a stent placement in light of her significant left-sided hydronephrosis.                 ALLERGIES:  has No Known Allergies.  Meds: Current Outpatient Medications  Medication Sig Dispense Refill  . acetaminophen (TYLENOL) 500 MG tablet Take 1,000 mg by mouth 3 (three) times daily.     Marland Kitchen oxyCODONE-acetaminophen (PERCOCET/ROXICET) 5-325 MG tablet Take 1 tablet by mouth every 6 (six) hours as needed for severe pain. 30 tablet 0  . Probiotic Product (ALIGN) 4 MG CAPS Take 4 mg by mouth daily.    . solifenacin (VESICARE) 10 MG tablet Take 1 tablet (10 mg total) by mouth daily. 30 tablet 1  . SUMAtriptan (IMITREX) 100 MG tablet Take 1 tablet (100 mg total) by mouth once for 1 dose. May repeat in 2 hours if no relief. 10 tablet 11  . Turmeric (QC TUMERIC COMPLEX PO) Take by mouth.    . vitamin B-12 (CYANOCOBALAMIN) 500 MCG tablet Take 1,000 mcg by mouth daily.     No current facility-administered medications for this encounter.   Facility-Administered Medications Ordered in Other Encounters  Medication Dose Route Frequency Provider Last Rate Last  Admin  . sodium chloride (PF) 0.9 % injection             Physical Findings: The patient is in no acute distress. Patient is alert and oriented.   Lab Findings: Lab Results  Component Value Date   WBC 4.1 05/04/2019   HGB 10.7 (L) 05/04/2019   HCT 32.7 (L) 05/04/2019   MCV 95.9 05/04/2019   PLT 297 05/04/2019    Radiographic Findings: CT Abdomen Pelvis W Contrast  Result Date: 05/13/2019 CLINICAL DATA:  Cervical cancer, s/p chemo and XRT, assess treatment response, pelvic swelling, bloating, left lower quadrant pain EXAM: CT ABDOMEN AND PELVIS WITH CONTRAST TECHNIQUE: Multidetector CT imaging of the abdomen and pelvis was performed  using the standard protocol following bolus administration of intravenous contrast. CONTRAST:  170mL OMNIPAQUE IOHEXOL 300 MG/ML SOLN, additional oral enteric contrast COMPARISON:  PET-CT, 10/27/2018, 04/24/2018, MR pelvis, 04/27/2018 FINDINGS: Lower chest: No acute abnormality. Hepatobiliary: No solid liver abnormality is seen. No gallstones, gallbladder wall thickening, or biliary dilatation. Pancreas: Unremarkable. No pancreatic ductal dilatation or surrounding inflammatory changes. Spleen: Normal in size without significant abnormality. Adrenals/Urinary Tract: Adrenal glands are unremarkable. There is new moderate left, mild right hydronephrosis and hydroureter, with soft tissue obstructing the distal left ureter in the vicinity of the adnexa (series 2, image 74). There is high-grade obstruction of the distal left ureter with no excreted contrast on delayed phase imaging. Stomach/Bowel: Stomach is within normal limits. Appendix appears normal. No evidence of bowel wall thickening, distention, or inflammatory changes. Vascular/Lymphatic: No significant vascular findings are present. Interval enlargement of numerous abnormal retroperitoneal and bilateral inguinal lymph nodes, largest left retroperitoneal node measuring 1.7 x 1.0 cm (series 2, image 38). Reproductive: There is ill-defined, hypoenhancing soft tissue most clearly visualized at the left aspect of the uterus and adnexa, which obstructs the distal left ureter, measuring approximately 4.0 x 3.4 cm (series 2, image 72). Ill-defined, hypoenhancing appearance of the cervix with loss of distinction of the fat plane to the posterior urinary bladder (series 5, image 60). Other: No abdominal wall hernia or abnormality. No abdominopelvic ascites. Musculoskeletal: No acute or significant osseous findings. IMPRESSION: 1. There is ill-defined, hypoenhancing soft tissue most clearly visualized at the left aspect of the uterus and adnexa, which obstructs the distal  left ureter, measuring approximately 4.0 x 3.4 cm. This is concerning for local recurrence of cervical malignancy. 2. Ill-defined, hypoenhancing appearance of the cervix with loss of distinction of the fat plane to the posterior urinary bladder, in keeping with primary malignancy. 3. Interval enlargement of numerous abnormal retroperitoneal and bilateral inguinal lymph nodes, consistent with nodal metastatic disease. 4. There is new moderate left, mild right hydronephrosis and hydroureter, with soft tissue described above obstructing the distal left ureter in the vicinity of the adnexa. There is high-grade obstruction of the distal left ureter with no excreted contrast on delayed phase imaging. Point of obstruction of the right ureter is not clearly visualized, possibly at the ureterovesicular junction. These results will be called to the ordering clinician or representative by the Radiologist Assistant, and communication documented in the PACS or [employer]. Electronically Signed   By: Eddie Candle M.D.   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  Unfortunately the patient has developed a significant recurrence within in 1 year of her  treatment.. Her posttreatment PET CT scan was  essentially clear just 6 months ago.  Plan: Medical oncology consultation, urology consultation for consideration for stent placement in light of significant left-sided hydronephrosis.  Patient is having progressive pain in the left pelvis area and is having difficulty sleeping at night in light of this issue. I have given her a prescription for Percocet. ____________________________________   Blair Promise, PhD, MD  This document serves as a record of services personally performed by Gery Pray, MD. It was created on his behalf by Clerance Lav, a trained  medical scribe. The creation of this record is based on the scribe's personal observations and the provider's statements to them. This document has been checked and approved by the attending provider.

## 2019-05-13 NOTE — Progress Notes (Signed)
Sheena Torres and scheduled appointment with Dr. Alvy Bimler on Monday, 05/17/19 at 2 pm.  She is going to bring a family member with her.  Also discussed that a referral has been placed with [facility] and that we are working on getting her an appointment.  She verbalized understanding and agreement.

## 2019-05-13 NOTE — Progress Notes (Signed)
Called referral to [facility]. Appointment scheduled with the resident clinic on 05/20/19 at 12:30.  Sheena Torres of appointment details.

## 2019-05-17 ENCOUNTER — Other Ambulatory Visit: Payer: Self-pay

## 2019-05-17 ENCOUNTER — Inpatient Hospital Stay: Payer: Medicaid Other | Attending: Hematology and Oncology | Admitting: Hematology and Oncology

## 2019-05-17 VITALS — BP 134/86 | HR 86 | Temp 98.7°F | Resp 18 | Ht 62.0 in | Wt 148.4 lb

## 2019-05-17 DIAGNOSIS — C530 Malignant neoplasm of endocervix: Secondary | ICD-10-CM | POA: Diagnosis present

## 2019-05-17 DIAGNOSIS — G893 Neoplasm related pain (acute) (chronic): Secondary | ICD-10-CM | POA: Insufficient documentation

## 2019-05-17 DIAGNOSIS — Z7189 Other specified counseling: Secondary | ICD-10-CM

## 2019-05-17 DIAGNOSIS — N131 Hydronephrosis with ureteral stricture, not elsewhere classified: Secondary | ICD-10-CM | POA: Insufficient documentation

## 2019-05-17 DIAGNOSIS — N1330 Unspecified hydronephrosis: Secondary | ICD-10-CM

## 2019-05-18 ENCOUNTER — Encounter: Payer: Self-pay | Admitting: Hematology and Oncology

## 2019-05-18 ENCOUNTER — Encounter: Payer: Self-pay | Admitting: Oncology

## 2019-05-18 ENCOUNTER — Telehealth: Payer: Self-pay | Admitting: Oncology

## 2019-05-18 DIAGNOSIS — N1330 Unspecified hydronephrosis: Secondary | ICD-10-CM | POA: Insufficient documentation

## 2019-05-18 NOTE — Assessment & Plan Note (Signed)
She was started on pain medicine by radiation oncologist She felt that her pain control currently is adequate We discussed narcotic refill policy I warned her about risk of nausea and constipation while on pain medicine

## 2019-05-18 NOTE — Progress Notes (Signed)
Glenard Haring called back and said the slides for [id number] were sent to [place] her that Dr. Alvy Bimler would like the PD-L1 ordered when the slides return.

## 2019-05-18 NOTE — Progress Notes (Signed)
Called Glenard Haring at [facility] and faxed request for PD-L1 on [id number].

## 2019-05-18 NOTE — Telephone Encounter (Signed)
That is ok 

## 2019-05-18 NOTE — Progress Notes (Signed)
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[facility]) I reviewed imaging studies with the patient and her son I will try to get her original biopsy to get PD-L1 testing done to see if she will qualify for pembrolizumab We discussed the current guidelines I explained to the patient why surgery or radiation is not indicated With recurrent disease, the role of treatment is strictly palliative in nature Due to risk of kidney failure, I do not recommend cisplatin based chemotherapy right now even if she has normal kidney function after stent placement to relieve hydronephrosis I would recommend combination chemotherapy with carboplatin, paclitaxel and bevacizumab down the road once her surgical intervention plans in regards to urology procedure has been completed  We reviewed the NCCN guidelines  We discussed some of the risks, benefits, side-effects of carboplatin & Taxol. Treatment is intravenous, every 3 weeks x 6 cycles  Some of the short term side-effects included, though not limited to, including weight loss, life threatening infections, risk of allergic reactions, need for transfusions of blood products, nausea, vomiting, change in bowel habits, loss of hair, admission to hospital for various reasons, and risks of death.   Long term side-effects are also discussed including risks of infertility, permanent damage to nerve function, hearing loss, chronic fatigue, kidney damage with possibility needing hemodialysis, and rare secondary malignancy including bone marrow disorders.  The patient is aware that the response rates discussed earlier is not guaranteed.  After a long discussion, patient is undecided  Patient education material was dispensed. We discussed premedication with dexamethasone before chemotherapy. We discussed the use of cranial prosthesis  versus hair preservation I do not plan to order port placement as she has decent venous access She understood the rationale of getting urology procedure completed first before we can start chemotherapy I do not plan prophylactic G-CSF support I will see her again prior to starting treatment for baseline blood work and further discussion about plan of care, as the patient appears to be overwhelmed with information today   Bilateral hydronephrosis She has bilateral hydronephrosis and is at risk of renal failure without surgical intervention We will try to call urology service to see if we can expedite her appointment She understood the rationale of waiting until urologic procedure is completed before we can start her on treatment I plan to start her on carboplatin based treatment rather than cisplatin-based treatment due to high risk of kidney injury  Cancer associated pain She was started on pain medicine by radiation oncologist She felt that her pain control currently is adequate We discussed narcotic refill policy I warned her about risk of nausea and constipation while on pain medicine  Goals of care, counseling/discussion We have extensive discussion about goals of care The patient is informed that her disease is considered incurable although still treatable I recommend her to consider applying for permanent disability She has significant financial issues right now and I will try to get her financial assistance to pay for her medication and treatment. The patient appears to be overwhelmed She is saddened by the thought that the treatment goal is palliative in nature I will bring her back for another consultation and discussion prior to starting her on chemotherapy, after her urologic procedure is completed.   Orders Placed This Encounter  Procedures  . CBC with Differential (Cancer Center Only)    Standing Status:  Standing    Number of Occurrences:   20    Standing Expiration  Date:   05/17/2020  . CMP ([place] only)    Standing Status:   Standing    Number of Occurrences:   20    Standing Expiration Date:   05/17/2020    All questions were answered. The patient knows to call the clinic with any problems, questions or concerns. The total time spent in the appointment was 80 minutes encounter with patients including review of chart and various tests results, discussions about plan of care and coordination of care plan   Heath Lark, MD 05/18/2019 7:58 AM  INTERVAL HISTORY: Please see below for problem oriented charting. She returns with her son, Arnette Norris for further evaluation due to recent findings of recurrent metastatic disease on CT imaging Since last time I saw her, she had regular follow-up with radiation oncologist alternate with GYN oncologist Starting around January of this year, she started to have intermittent left inguinal pain that has gotten progressively worse She started to notice left inguinal swelling approximately a month ago She has unexplained weight gain over the past few weeks of approximately 6 pounds that she attributes to lower extremity edema Her pain is rated at about 7 out of 10 without pain medicine.  Last week, she was started on pain medicine and it reduces the pain to 4 out of 10 which she felt is manageable. She took an average about 2 to 3 tablets/day She denies nausea or constipation She noticed some reduced urine output in the last few days Denies hematuria  SUMMARY OF ONCOLOGIC HISTORY: Oncology History  Malignant neoplasm of endocervix ([place])  09/15/2017 Initial Diagnosis   The patient reports postcoital bleeding since August, 2019 and postmenopausal bleeding since November, 2019. 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    04/16/2018 Surgery   She was taken to the OR on 04/16/18 for a cervical cone biopsy. The specimen revealed a grossly  normal cervix on the ectocervix.  Cone specimen revealed squamous cell carcinoma.  When discussing this with the pathologist he reported that in the actual cone specimen itself that was CIN 3 however there was a separate fragment of tissue that included carcinoma.  The dimensions of this was 0.6 cm, however this involved the margins.  A post cone ECC was positive for squamous cell carcinoma, as well as an endometrial curettage which also contain benign endometrial glands.    04/16/2018 Pathology Results   Endocervix curettage: [facility]   04/22/2018 Initial Diagnosis   Malignant neoplasm of endocervix ([place])   04/24/2018 PET scan   1. There is a large mass involving the cervix and endometrium which has a maximum dimension of 5.3 cm within SUV max of 20.4. 2. Small bilateral pelvic sidewall lymph nodes measuring less than 1cm with mild nonspecific uptake. The no hypermetabolic adenopathy or evidence of distant metastatic disease. 3. Nonspecific pulmonary nodules measuring less than 5 mm are identified in the right lung. Too small to characterize by PET-CT.    04/27/2018 Imaging   MRI pelvis  1. Uterine cervix 5.5 x 3.4 x 3.2 cm mass compatible with primary cervical malignancy, with mild parametrial invasion. Stage IIB by MRI. 2. Small volume simple free fluid in the pelvic cul-de-sac. 3. No pathologically enlarged pelvic lymph nodes, see comments.   05/01/2018 Cancer Staging   Staging form: Cervix Uteri, AJCC 8th  Edition - Clinical: FIGO Stage IIB (cT2b, cN0, cM0) - Signed by Heath Lark, MD on 05/01/2018   05/08/2018 Procedure   Placement of a subcutaneous port device. Catheter tip at the superior cavoatrial junction.   05/14/2018 - 07/27/2018 Radiation Therapy   Radiation treatment dates:    1. 05/14/2018 - 06/24/2018 2. 5/21, 5/28, 6/1, 6/9, 07/27/2018   Site/dose:    1.         A. pelvsi / 45 Gy in 25 fractions              B. Parametrial  boost / 9 Gy in 5 fractions 2. Cervix, Tandem and Ring  System / 27.5 Gy in 5 fractions     05/15/2018 - 06/26/2018 Chemotherapy   The patient had weekly cisplatin   10/27/2018 PET scan   Near complete resolution of FDG uptake at site of previously seen cervical mass. Resolution of previously seen sub-cm pelvic lymph nodes. No evidence of metabolically-active metastatic disease.   05/13/2019 Imaging   1. There is ill-defined, hypoenhancing soft tissue most clearly visualized at the left aspect of the uterus and adnexa, which obstructs the distal left ureter, measuring approximately 4.0 x 3.4 cm. This is concerning for local recurrence of cervical malignancy.   2. Ill-defined, hypoenhancing appearance of the cervix with loss of distinction of the fat plane to the posterior urinary bladder, in keeping with primary malignancy.   3. Interval enlargement of numerous abnormal retroperitoneal and bilateral inguinal lymph nodes, consistent with nodal metastatic disease.    4. There is new moderate left, mild right hydronephrosis and hydroureter, with soft tissue described above obstructing the distal left ureter in the vicinity of the adnexa. There is high-grade obstruction of the distal left ureter with no excreted contrast on delayed phase imaging. Point of obstruction of the right ureter is not clearly visualized, possibly at the ureterovesicular junction.       REVIEW OF SYSTEMS:   Constitutional: Denies fevers, chills or abnormal weight loss Eyes: Denies blurriness of vision Ears, nose, mouth, throat, and face: Denies mucositis or sore throat Respiratory: Denies cough, dyspnea or wheezes Gastrointestinal:  Denies nausea, heartburn or change in bowel habits Skin: Denies abnormal skin rashes Neurological:Denies numbness, tingling or new weaknesses Behavioral/Psych: Mood is stable, no new changes  All other systems were reviewed with the patient and are negative.  I have reviewed the past medical history, past surgical history, social history and  family history with the patient and they are unchanged from previous note.  ALLERGIES:  has No Known Allergies.  MEDICATIONS:  Current Outpatient Medications  Medication Sig Dispense Refill  . acetaminophen (TYLENOL) 500 MG tablet Take 1,000 mg by mouth 3 (three) times daily.     Marland Kitchen oxyCODONE-acetaminophen (PERCOCET/ROXICET) 5-325 MG tablet Take 1 tablet by mouth every 6 (six) hours as needed for severe pain. 30 tablet 0  . Probiotic Product (ALIGN) 4 MG CAPS Take 4 mg by mouth daily.    . solifenacin (VESICARE) 10 MG tablet Take 1 tablet (10 mg total) by mouth daily. 30 tablet 1  . SUMAtriptan (IMITREX) 100 MG tablet Take 1 tablet (100 mg total) by mouth once for 1 dose. May repeat in 2 hours if no relief. 10 tablet 11  . Turmeric (QC TUMERIC COMPLEX PO) Take by mouth.    . vitamin B-12 (CYANOCOBALAMIN) 500 MCG tablet Take 1,000 mcg by mouth daily.     No current facility-administered medications for this visit.    PHYSICAL EXAMINATION:  ECOG PERFORMANCE STATUS: 1 - Symptomatic but completely ambulatory  Vitals:   05/17/19 1427  BP: 134/86  Pulse: 86  Resp: 18  Temp: 98.7 F (37.1 C)  SpO2: 100%   Filed Weights   05/17/19 1427  Weight: 148 lb 6.4 oz (67.3 kg)    GENERAL:alert, no distress and comfortable.  Limited examination is performed due to extensive time already spent on counseling the patient.  On just visual inspection, it appears that she has left inguinal swelling and mild lower extremity edema NEURO: alert & oriented x 3 with fluent speech, no focal motor/sensory deficits  LABORATORY DATA:  I have reviewed the data as listed    Component Value Date/Time   NA 140 05/04/2019 1159   K 4.1 05/04/2019 1159   CL 104 05/04/2019 1159   CO2 27 05/04/2019 1159   GLUCOSE 94 05/04/2019 1159   BUN 15 05/04/2019 1159   CREATININE 1.23 (H) 05/04/2019 1159   CALCIUM 9.5 05/04/2019 1159   PROT 7.0 05/04/2019 1159   ALBUMIN 3.9 05/04/2019 1159   AST 18 05/04/2019 1159    ALT 12 05/04/2019 1159   ALKPHOS 91 05/04/2019 1159   BILITOT 0.3 05/04/2019 1159   GFRNONAA 50 (L) 05/04/2019 1159   GFRAA 58 (L) 05/04/2019 1159    No results found for: SPEP, UPEP  Lab Results  Component Value Date   WBC 4.1 05/04/2019   NEUTROABS 2.9 05/04/2019   HGB 10.7 (L) 05/04/2019   HCT 32.7 (L) 05/04/2019   MCV 95.9 05/04/2019   PLT 297 05/04/2019      Chemistry      Component Value Date/Time   NA 140 05/04/2019 1159   K 4.1 05/04/2019 1159   CL 104 05/04/2019 1159   CO2 27 05/04/2019 1159   BUN 15 05/04/2019 1159   CREATININE 1.23 (H) 05/04/2019 1159      Component Value Date/Time   CALCIUM 9.5 05/04/2019 1159   ALKPHOS 91 05/04/2019 1159   AST 18 05/04/2019 1159   ALT 12 05/04/2019 1159   BILITOT 0.3 05/04/2019 1159       RADIOGRAPHIC STUDIES: I have also reviewed imaging study with the patient and her son I have personally reviewed the radiological images as listed and agreed with the findings in the report. CT Abdomen Pelvis W Contrast  Result Date: 05/13/2019 CLINICAL DATA:  Cervical cancer, s/p chemo and XRT, assess treatment response, pelvic swelling, bloating, left lower quadrant pain EXAM: CT ABDOMEN AND PELVIS WITH CONTRAST TECHNIQUE: Multidetector CT imaging of the abdomen and pelvis was performed using the standard protocol following bolus administration of intravenous contrast. CONTRAST:  148m OMNIPAQUE IOHEXOL 300 MG/ML SOLN, additional oral enteric contrast COMPARISON:  PET-CT, 10/27/2018, 04/24/2018, MR pelvis, 04/27/2018 FINDINGS: Lower chest: No acute abnormality. Hepatobiliary: No solid liver abnormality is seen. No gallstones, gallbladder wall thickening, or biliary dilatation. Pancreas: Unremarkable. No pancreatic ductal dilatation or surrounding inflammatory changes. Spleen: Normal in size without significant abnormality. Adrenals/Urinary Tract: Adrenal glands are unremarkable. There is new moderate left, mild right hydronephrosis and  hydroureter, with soft tissue obstructing the distal left ureter in the vicinity of the adnexa (series 2, image 74). There is high-grade obstruction of the distal left ureter with no excreted contrast on delayed phase imaging. Stomach/Bowel: Stomach is within normal limits. Appendix appears normal. No evidence of bowel wall thickening, distention, or inflammatory changes. Vascular/Lymphatic: No significant vascular findings are present. Interval enlargement of numerous abnormal retroperitoneal and bilateral inguinal lymph nodes, largest left retroperitoneal  node measuring 1.7 x 1.0 cm (series 2, image 38). Reproductive: There is ill-defined, hypoenhancing soft tissue most clearly visualized at the left aspect of the uterus and adnexa, which obstructs the distal left ureter, measuring approximately 4.0 x 3.4 cm (series 2, image 72). Ill-defined, hypoenhancing appearance of the cervix with loss of distinction of the fat plane to the posterior urinary bladder (series 5, image 60). Other: No abdominal wall hernia or abnormality. No abdominopelvic ascites. Musculoskeletal: No acute or significant osseous findings. IMPRESSION: 1. There is ill-defined, hypoenhancing soft tissue most clearly visualized at the left aspect of the uterus and adnexa, which obstructs the distal left ureter, measuring approximately 4.0 x 3.4 cm. This is concerning for local recurrence of cervical malignancy. 2. Ill-defined, hypoenhancing appearance of the cervix with loss of distinction of the fat plane to the posterior urinary bladder, in keeping with primary malignancy. 3. Interval enlargement of numerous abnormal retroperitoneal and bilateral inguinal lymph nodes, consistent with nodal metastatic disease. 4. There is new moderate left, mild right hydronephrosis and hydroureter, with soft tissue described above obstructing the distal left ureter in the vicinity of the adnexa. There is high-grade obstruction of the distal left ureter with no  excreted contrast on delayed phase imaging. Point of obstruction of the right ureter is not clearly visualized, possibly at the ureterovesicular junction. These results will be called to the ordering clinician or representative by the Radiologist Assistant, and communication documented in the PACS or [employer]. Electronically Signed   By: Eddie Candle M.D.   On: 05/13/2019 08:56

## 2019-05-18 NOTE — Assessment & Plan Note (Signed)
I reviewed imaging studies with the patient and her son I will try to get her original biopsy to get PD-L1 testing done to see if she will qualify for pembrolizumab We discussed the current guidelines I explained to the patient why surgery or radiation is not indicated With recurrent disease, the role of treatment is strictly palliative in nature Due to risk of kidney failure, I do not recommend cisplatin based chemotherapy right now even if she has normal kidney function after stent placement to relieve hydronephrosis I would recommend combination chemotherapy with carboplatin, paclitaxel and bevacizumab down the road once her surgical intervention plans in regards to urology procedure has been completed  We reviewed the NCCN guidelines  We discussed some of the risks, benefits, side-effects of carboplatin & Taxol. Treatment is intravenous, every 3 weeks x 6 cycles  Some of the short term side-effects included, though not limited to, including weight loss, life threatening infections, risk of allergic reactions, need for transfusions of blood products, nausea, vomiting, change in bowel habits, loss of hair, admission to hospital for various reasons, and risks of death.   Long term side-effects are also discussed including risks of infertility, permanent damage to nerve function, hearing loss, chronic fatigue, kidney damage with possibility needing hemodialysis, and rare secondary malignancy including bone marrow disorders.  The patient is aware that the response rates discussed earlier is not guaranteed.  After a long discussion, patient is undecided  Patient education material was dispensed. We discussed premedication with dexamethasone before chemotherapy. We discussed the use of cranial prosthesis versus hair preservation I do not plan to order port placement as she has decent venous access She understood the rationale of getting urology procedure completed first before we can start  chemotherapy I do not plan prophylactic G-CSF support I will see her again prior to starting treatment for baseline blood work and further discussion about plan of care, as the patient appears to be overwhelmed with information today

## 2019-05-18 NOTE — Telephone Encounter (Signed)
Called Sheena Torres regarding her second opinion at [facility].  She has canceled it and said time is of the essence in starting chemo and would like treatment at [facility].    Discussed her appointment at [facility].  She would like to keep the appointment on Thursday.  She is aware that she can be on the "on call" list at [facility] but would need to pay $250.00 up front and the cost for any scans.  She said she was able to urinate a large amount this morning and the stream was more like normal.  Advised her to call if she wants to be seen sooner. Also advised her that I will reach out to the financial counselors to see if she qualifies for any assistance programs.  She also asked for resources for wigs and places where she can go to try them on.  Advised her I will gather some information for her and call her back this afternoon.

## 2019-05-18 NOTE — Telephone Encounter (Signed)
I gave her and her son a list of wig store yesterday  Also, please ask [name] if we can set up an account at [facility]

## 2019-05-18 NOTE — Assessment & Plan Note (Signed)
We have extensive discussion about goals of care The patient is informed that her disease is considered incurable although still treatable I recommend her to consider applying for permanent disability She has significant financial issues right now and I will try to get her financial assistance to pay for her medication and treatment. The patient appears to be overwhelmed She is saddened by the thought that the treatment goal is palliative in nature I will bring her back for another consultation and discussion prior to starting her on chemotherapy, after her urologic procedure is completed.

## 2019-05-18 NOTE — Assessment & Plan Note (Signed)
She has bilateral hydronephrosis and is at risk of renal failure without surgical intervention We will try to call urology service to see if we can expedite her appointment She understood the rationale of waiting until urologic procedure is completed before we can start her on treatment I plan to start her on carboplatin based treatment rather than cisplatin-based treatment due to high risk of kidney injury

## 2019-05-18 NOTE — Telephone Encounter (Signed)
Called Kamera back and let her know.

## 2019-05-18 NOTE — Progress Notes (Signed)
DISCONTINUE OFF PATHWAY REGIMEN - Other Dx   [id number]:Cisplatin 40 mg/m2 IV D1 q7 Days + RT:   A cycle is every 7 days:     Cisplatin   **Always confirm dose/schedule in your pharmacy ordering system**  REASON: Other Reason PRIOR TREATMENT: Cisplatin 40 mg/m2 IV D1 q7 Days + RT TREATMENT RESPONSE: Complete Response (CR)  START OFF PATHWAY REGIMEN - Other   [id number]:Carboplatin + Paclitaxel (5/175) q21 Days:   A cycle is every 21 days:     Paclitaxel      Carboplatin   **Always confirm dose/schedule in your pharmacy ordering system**  Patient Characteristics: Intent of Therapy: Non-Curative / Palliative Intent, Discussed with Patient

## 2019-05-18 NOTE — Telephone Encounter (Signed)
Sheena Torres said she is trying to feel/look as normal as possible during chemotherapy.  Her family wants to help her have microblading of her eyebrows and she is wondering if she should try to have it done before chemotherapy starts.

## 2019-05-20 ENCOUNTER — Telehealth: Payer: Self-pay | Admitting: Oncology

## 2019-05-20 NOTE — Telephone Encounter (Signed)
Geet called and said she is at [facility].  She is concerned because the Resident she is seeing did not have a "game plan" ready.  She was told she is not in renal failure and that stents are expensive and she doesn't have insurance.  She doesn't want to delay her chemotherapy waiting for the stents so she is wondering if she can go ahead and start without them.  She is also wondering about a referral to [facility].

## 2019-05-20 NOTE — Telephone Encounter (Signed)
I just spoke with her

## 2019-05-20 NOTE — Telephone Encounter (Signed)
Sheena Torres that we will call her later today or tomorrow with a plan for chemo/urology.

## 2019-05-21 ENCOUNTER — Inpatient Hospital Stay (HOSPITAL_BASED_OUTPATIENT_CLINIC_OR_DEPARTMENT_OTHER): Payer: Medicaid Other | Admitting: Hematology and Oncology

## 2019-05-21 ENCOUNTER — Telehealth: Payer: Self-pay | Admitting: Oncology

## 2019-05-21 ENCOUNTER — Inpatient Hospital Stay: Payer: Medicaid Other

## 2019-05-21 ENCOUNTER — Other Ambulatory Visit: Payer: Self-pay | Admitting: Hematology and Oncology

## 2019-05-21 ENCOUNTER — Encounter: Payer: Self-pay | Admitting: Hematology and Oncology

## 2019-05-21 ENCOUNTER — Other Ambulatory Visit: Payer: Self-pay | Admitting: Urology

## 2019-05-21 ENCOUNTER — Other Ambulatory Visit: Payer: Self-pay

## 2019-05-21 VITALS — BP 134/88 | HR 88 | Temp 98.9°F | Resp 18 | Ht 62.0 in | Wt 147.8 lb

## 2019-05-21 DIAGNOSIS — C530 Malignant neoplasm of endocervix: Secondary | ICD-10-CM | POA: Diagnosis not present

## 2019-05-21 DIAGNOSIS — Z599 Problem related to housing and economic circumstances, unspecified: Secondary | ICD-10-CM

## 2019-05-21 DIAGNOSIS — Z598 Other problems related to housing and economic circumstances: Secondary | ICD-10-CM

## 2019-05-21 DIAGNOSIS — Z7189 Other specified counseling: Secondary | ICD-10-CM

## 2019-05-21 DIAGNOSIS — G893 Neoplasm related pain (acute) (chronic): Secondary | ICD-10-CM | POA: Diagnosis not present

## 2019-05-21 DIAGNOSIS — N1330 Unspecified hydronephrosis: Secondary | ICD-10-CM

## 2019-05-21 LAB — CMP (CANCER CENTER ONLY)
ALT: 8 U/L (ref 0–44)
AST: 15 U/L (ref 15–41)
Albumin: 3.7 g/dL (ref 3.5–5.0)
Alkaline Phosphatase: 84 U/L (ref 38–126)
Anion gap: 6 (ref 5–15)
BUN: 12 mg/dL (ref 6–20)
CO2: 29 mmol/L (ref 22–32)
Calcium: 9.4 mg/dL (ref 8.9–10.3)
Chloride: 102 mmol/L (ref 98–111)
Creatinine: 1.18 mg/dL — ABNORMAL HIGH (ref 0.44–1.00)
GFR, Est AFR Am: >60 mL/min (ref >60)
GFR, Estimated: 53 mL/min — ABNORMAL LOW (ref >60)
Glucose, Bld: 125 mg/dL — ABNORMAL HIGH (ref 70–99)
Potassium: 3.8 mmol/L (ref 3.5–5.1)
Sodium: 137 mmol/L (ref 135–145)
Total Bilirubin: 0.2 mg/dL — ABNORMAL LOW (ref 0.3–1.2)
Total Protein: 7.1 g/dL (ref 6.5–8.1)

## 2019-05-21 LAB — CBC WITH DIFFERENTIAL (CANCER CENTER ONLY)
Abs Immature Granulocytes: 0.02 10*3/uL (ref 0.00–0.07)
Basophils Absolute: 0 10*3/uL (ref 0.0–0.1)
Basophils Relative: 1 %
Eosinophils Absolute: 0.2 10*3/uL (ref 0.0–0.5)
Eosinophils Relative: 4 %
HCT: 32.6 % — ABNORMAL LOW (ref 36.0–46.0)
Hemoglobin: 10.5 g/dL — ABNORMAL LOW (ref 12.0–15.0)
Immature Granulocytes: 0 %
Lymphocytes Relative: 14 %
Lymphs Abs: 0.7 10*3/uL (ref 0.7–4.0)
MCH: 31.1 pg (ref 26.0–34.0)
MCHC: 32.2 g/dL (ref 30.0–36.0)
MCV: 96.4 fL (ref 80.0–100.0)
Monocytes Absolute: 0.4 10*3/uL (ref 0.1–1.0)
Monocytes Relative: 7 %
Neutro Abs: 3.9 10*3/uL (ref 1.7–7.7)
Neutrophils Relative %: 74 %
Platelet Count: 280 10*3/uL (ref 150–400)
RBC: 3.38 MIL/uL — ABNORMAL LOW (ref 3.87–5.11)
RDW: 12.3 % (ref 11.5–15.5)
WBC Count: 5.2 10*3/uL (ref 4.0–10.5)
nRBC: 0 % (ref 0.0–0.2)

## 2019-05-21 MED ORDER — PROCHLORPERAZINE MALEATE 10 MG PO TABS: 10.0000 mg | ORAL_TABLET | Freq: Four times a day (QID) | ORAL | 1 refills | Status: DC | PRN

## 2019-05-21 MED ORDER — OXYCODONE-ACETAMINOPHEN 5-325 MG PO TABS: 1.0000 | ORAL_TABLET | Freq: Four times a day (QID) | ORAL | 0 refills | Status: DC | PRN

## 2019-05-21 MED ORDER — ONDANSETRON HCL 8 MG PO TABS: 8.0000 mg | ORAL_TABLET | Freq: Two times a day (BID) | ORAL | 1 refills | Status: DC | PRN

## 2019-05-21 MED ORDER — DEXAMETHASONE 4 MG PO TABS: 4.0000 mg | ORAL_TABLET | Freq: Two times a day (BID) | ORAL | 0 refills | Status: DC

## 2019-05-21 MED FILL — OXYCODONE-APAP 5-325MG: 5-325 | 7 days supply | Qty: 30 | Fill #0

## 2019-05-21 MED FILL — ONDANSETRON HCL 8 MG TABLET: 8 | 15 days supply | Qty: 30 | Fill #0

## 2019-05-21 MED FILL — DEXAMETHASONE 4 MG TABLET: 4 | 18 days supply | Qty: 36 | Fill #0

## 2019-05-21 MED FILL — PROCHLORPERAZINE 10 MG TAB: 10 | 7 days supply | Qty: 30 | Fill #0

## 2019-05-21 NOTE — Telephone Encounter (Signed)
Otho Bellows and scheduled appointment to see Dr. Alvy Bimler at 2:30 today with labs to follow.  She verbalized understanding and agreement.

## 2019-05-21 NOTE — Assessment & Plan Note (Signed)
We discussed again the rationale of pursuing bilateral stent placement first before chemotherapy and she is in agreement

## 2019-05-21 NOTE — Assessment & Plan Note (Signed)
I reviewed the plan of care with the patient and her son again She is scheduled for urologic procedure middle next week The earliest we can schedule her chemotherapy after the procedure will be on Friday She will get her baseline blood work done today Unfortunately, her original slides were sent to [facility] for second opinion and I am not able to get PD-L1 testing done Per patient, she has canceled the opinion and hopefully, the slides will be returned back to her original site of biopsy soon so that we can get PD-L1 testing done  However, it will not change management Due to aggressive nature of her disease, I recommend we proceed with chemotherapy immediately  We reviewed the NCCN guidelines We discussed the role of chemotherapy. The intent is of palliative intent.  We discussed some of the risks, benefits, side-effects of carboplatin & Taxol. Treatment is intravenous, every 3 weeks x 6 cycles  Some of the short term side-effects included, though not limited to, including weight loss, life threatening infections, risk of allergic reactions, need for transfusions of blood products, nausea, vomiting, change in bowel habits, loss of hair, admission to hospital for various reasons, and risks of death.   Long term side-effects are also discussed including risks of infertility, permanent damage to nerve function, hearing loss, chronic fatigue, kidney damage with possibility needing hemodialysis, and rare secondary malignancy including bone marrow disorders.  The patient is aware that the response rates discussed earlier is not guaranteed.  After a long discussion, patient made an informed decision to proceed with the prescribed plan of care.   Patient education material was dispensed. We discussed premedication with dexamethasone before chemotherapy. I do not plan prophylactic G-CSF support After 3 cycles of treatment, if she had good response to treatment and resolution  of hydronephrosis, we will get her stent removed and then we can add bevacizumab long-term to her regimen and as maintenance treatment She does not need port placement I will see her prior to cycle 2 of treatment

## 2019-05-21 NOTE — Progress Notes (Signed)
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[facility]) I reviewed the plan of care with the patient and her son again She is scheduled for urologic procedure middle next week The earliest we can schedule her chemotherapy after the procedure will be on Friday She will get her baseline blood work done today Unfortunately, her original slides were sent to [facility] for second opinion and I am not able to get PD-L1 testing done Per patient, she has canceled the opinion and hopefully, the slides will be returned back to her original site of biopsy soon so that we can get PD-L1 testing done  However, it will not change management Due to aggressive nature of her disease, I recommend we proceed with chemotherapy immediately  We reviewed the NCCN guidelines We discussed the role of chemotherapy. The intent is of palliative intent.  We discussed some of the risks, benefits, side-effects of carboplatin & Taxol. Treatment is intravenous, every 3 weeks x 6 cycles  Some of the short term side-effects included, though not limited to, including weight loss, life threatening infections, risk of allergic reactions, need for transfusions of blood products, nausea, vomiting, change in bowel habits, loss of hair, admission to hospital for various reasons, and risks of death.   Long term side-effects are also discussed including risks of infertility, permanent damage to nerve function, hearing loss, chronic fatigue, kidney damage with possibility needing hemodialysis, and rare secondary malignancy including bone marrow disorders.  The patient is aware that the response rates discussed earlier is not guaranteed.  After a long discussion, patient made an informed decision to proceed with the prescribed plan of care.    Patient education material was dispensed. We discussed premedication with dexamethasone before chemotherapy. I do not plan prophylactic G-CSF support After 3 cycles of treatment, if she had good response to treatment and resolution of hydronephrosis, we will get her stent removed and then we can add bevacizumab long-term to her regimen and as maintenance treatment She does not need port placement I will see her prior to cycle 2 of treatment  Cancer associated pain Her pain control is good I refill her medication today We discussed narcotic refill policy  Bilateral hydronephrosis We discussed again the rationale of pursuing bilateral stent placement first before chemotherapy and she is in agreement  Financial difficulties She is experiencing significant financial difficulties We will help apply for assistance to pay for her treatment  Goals of care, counseling/discussion We have extensive discussion about goals of care The patient is informed that her disease is considered incurable although still treatable   No orders of the defined types were placed in this encounter.   All questions were answered. The patient knows to call the clinic with any problems, questions or concerns. The total time spent in the appointment was 40 minutes encounter with patients including review of chart and various tests results, discussions about plan of care and coordination of care plan   Heath Lark, MD 05/21/2019 3:07 PM  INTERVAL HISTORY: Please see below for problem oriented charting. 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    04/16/2018 Surgery   She was taken to the OR on 04/16/18 for a cervical cone biopsy. The specimen revealed a grossly normal cervix on the ectocervix.  Cone specimen revealed squamous cell carcinoma.  When discussing this with the pathologist he reported that in the actual cone specimen itself that was CIN 3 however there was a separate fragment of tissue that included carcinoma.  The dimensions of this was 0.6 cm, however this involved the margins.  A post cone ECC was positive for squamous cell carcinoma, as well as an endometrial curettage which also contain benign endometrial glands.    04/16/2018 Pathology Results   Endocervix curettage: [facility]   04/22/2018 Initial Diagnosis   Malignant neoplasm of endocervix ([place])   04/24/2018 PET scan   1. There is a large mass involving the cervix and endometrium which has a maximum dimension of 5.3 cm within SUV max of 20.4. 2. Small bilateral pelvic sidewall lymph nodes measuring less than 1cm with mild nonspecific uptake. The no hypermetabolic adenopathy or evidence of distant metastatic disease. 3. Nonspecific pulmonary nodules measuring less than 5 mm are identified in the right lung. Too small to characterize by PET-CT.    04/27/2018 Imaging   MRI pelvis  1. Uterine cervix 5.5 x 3.4 x 3.2 cm mass compatible with primary cervical malignancy, with mild parametrial invasion. Stage IIB by MRI. 2. Small volume simple free fluid in the pelvic cul-de-sac. 3. No pathologically enlarged pelvic lymph nodes, see comments.   05/01/2018 Cancer Staging   Staging form: Cervix Uteri, AJCC 8th Edition - Clinical: FIGO Stage IIB (cT2b, cN0, cM0) - Signed by Heath Lark, MD on 05/01/2018    05/08/2018 Procedure   Placement of a subcutaneous port device. Catheter tip at the superior cavoatrial junction.   05/14/2018 - 07/27/2018 Radiation Therapy   Radiation treatment dates:    1. 05/14/2018 - 06/24/2018 2. 5/21, 5/28, 6/1, 6/9, 07/27/2018   Site/dose:    1.         A. pelvsi / 45 Gy in 25 fractions              B. Parametrial  boost / 9 Gy in 5 fractions 2. Cervix, Tandem and Ring System / 27.5 Gy in 5 fractions     05/15/2018 - 06/26/2018 Chemotherapy   The patient had weekly cisplatin   10/27/2018 PET scan   Near complete resolution of FDG uptake at site of previously seen cervical mass. Resolution of previously seen sub-cm pelvic lymph nodes. No evidence of metabolically-active metastatic disease.   05/13/2019 Imaging   1. There is ill-defined, hypoenhancing soft tissue most clearly visualized at the left aspect of the uterus and adnexa, which obstructs the distal left ureter, measuring approximately 4.0 x 3.4 cm. This is concerning for local recurrence of cervical malignancy.   2. Ill-defined, hypoenhancing appearance of the cervix with loss of distinction of the fat plane to the posterior urinary bladder, in keeping with primary malignancy.   3. Interval enlargement of numerous abnormal retroperitoneal and bilateral inguinal lymph nodes, consistent with nodal metastatic disease.    4. There is  new moderate left, mild right hydronephrosis and hydroureter, with soft tissue described above obstructing the distal left ureter in the vicinity of the adnexa. There is high-grade obstruction of the distal left ureter with no excreted contrast on delayed phase imaging. Point of obstruction of the right ureter is not clearly visualized, possibly at the ureterovesicular junction.       REVIEW OF SYSTEMS:   Constitutional: Denies fevers, chills or abnormal weight loss Eyes: Denies blurriness of vision Ears, nose, mouth, throat, and face: Denies mucositis or sore throat Respiratory:  Denies cough, dyspnea or wheezes Cardiovascular: Denies palpitation, chest discomfort or lower extremity swelling Skin: Denies abnormal skin rashes Lymphatics: Denies new lymphadenopathy or easy bruising Neurological:Denies numbness, tingling or new weaknesses Behavioral/Psych: Mood is stable, no new changes  All other systems were reviewed with the patient and are negative.  I have reviewed the past medical history, past surgical history, social history and family history with the patient and they are unchanged from previous note.  ALLERGIES:  has No Known Allergies.  MEDICATIONS:  Current Outpatient Medications  Medication Sig Dispense Refill  . acetaminophen (TYLENOL) 500 MG tablet Take 1,000 mg by mouth daily as needed for mild pain or moderate pain.     Marland Kitchen dexamethasone (DECADRON) 4 MG tablet Take 1 tablet (4 mg total) by mouth 2 (two) times daily. 36 tablet 0  . ondansetron (ZOFRAN) 8 MG tablet Take 1 tablet (8 mg total) by mouth 2 (two) times daily as needed for refractory nausea / vomiting. Start on day 3 after carboplatin chemo. 30 tablet 1  . oxyCODONE-acetaminophen (PERCOCET/ROXICET) 5-325 MG tablet Take 1 tablet by mouth every 6 (six) hours as needed for severe pain. 30 tablet 0  . Probiotic Product (ALIGN) 4 MG CAPS Take 4 mg by mouth daily.    . prochlorperazine (COMPAZINE) 10 MG tablet Take 1 tablet (10 mg total) by mouth every 6 (six) hours as needed (Nausea or vomiting). 30 tablet 1  . SUMAtriptan (IMITREX) 100 MG tablet Take 1 tablet (100 mg total) by mouth once for 1 dose. May repeat in 2 hours if no relief. (Patient taking differently: Take 100 mg by mouth every 2 (two) hours as needed for migraine. May repeat in 2 hours if no relief.) 10 tablet 11   No current facility-administered medications for this visit.    PHYSICAL EXAMINATION: ECOG PERFORMANCE STATUS: 1 - Symptomatic but completely ambulatory  Vitals:   05/21/19 1429  BP: 134/88  Pulse: 88  Resp: 18  Temp:  98.9 F (37.2 C)  SpO2: 100%   Filed Weights   05/21/19 1429  Weight: 147 lb 12.8 oz (67 kg)    GENERAL:alert, no distress and comfortable SKIN: skin color, texture, turgor are normal, no rashes or significant lesions EYES: normal, Conjunctiva are pink and non-injected, sclera clear OROPHARYNX:no exudate, no erythema and lips, buccal mucosa, and tongue normal  NECK: supple, thyroid normal size, non-tender, without nodularity LYMPH:  no palpable lymphadenopathy in the cervical, axillary or inguinal LUNGS: clear to auscultation and percussion with normal breathing effort HEART: regular rate & rhythm and no murmurs and no lower extremity edema ABDOMEN:abdomen soft, non-tender and normal bowel sounds Musculoskeletal:no cyanosis of digits and no clubbing  NEURO: alert & oriented x 3 with fluent speech, no focal motor/sensory deficits  LABORATORY DATA:  I have reviewed the data as listed    Component Value Date/Time   NA 140 05/04/2019 1159   K 4.1 05/04/2019 1159   CL 104  05/04/2019 1159   CO2 27 05/04/2019 1159   GLUCOSE 94 05/04/2019 1159   BUN 15 05/04/2019 1159   CREATININE 1.23 (H) 05/04/2019 1159   CALCIUM 9.5 05/04/2019 1159   PROT 7.0 05/04/2019 1159   ALBUMIN 3.9 05/04/2019 1159   AST 18 05/04/2019 1159   ALT 12 05/04/2019 1159   ALKPHOS 91 05/04/2019 1159   BILITOT 0.3 05/04/2019 1159   GFRNONAA 50 (L) 05/04/2019 1159   GFRAA 58 (L) 05/04/2019 1159    No results found for: SPEP, UPEP  Lab Results  Component Value Date   WBC 4.1 05/04/2019   NEUTROABS 2.9 05/04/2019   HGB 10.7 (L) 05/04/2019   HCT 32.7 (L) 05/04/2019   MCV 95.9 05/04/2019   PLT 297 05/04/2019      Chemistry      Component Value Date/Time   NA 140 05/04/2019 1159   K 4.1 05/04/2019 1159   CL 104 05/04/2019 1159   CO2 27 05/04/2019 1159   BUN 15 05/04/2019 1159   CREATININE 1.23 (H) 05/04/2019 1159      Component Value Date/Time   CALCIUM 9.5 05/04/2019 1159   ALKPHOS 91 05/04/2019  1159   AST 18 05/04/2019 1159   ALT 12 05/04/2019 1159   BILITOT 0.3 05/04/2019 1159       RADIOGRAPHIC STUDIES: I have personally reviewed the radiological images as listed and agreed with the findings in the report. CT Abdomen Pelvis W Contrast  Result Date: 05/13/2019 CLINICAL DATA:  Cervical cancer, s/p chemo and XRT, assess treatment response, pelvic swelling, bloating, left lower quadrant pain EXAM: CT ABDOMEN AND PELVIS WITH CONTRAST TECHNIQUE: Multidetector CT imaging of the abdomen and pelvis was performed using the standard protocol following bolus administration of intravenous contrast. CONTRAST:  148m OMNIPAQUE IOHEXOL 300 MG/ML SOLN, additional oral enteric contrast COMPARISON:  PET-CT, 10/27/2018, 04/24/2018, MR pelvis, 04/27/2018 FINDINGS: Lower chest: No acute abnormality. Hepatobiliary: No solid liver abnormality is seen. No gallstones, gallbladder wall thickening, or biliary dilatation. Pancreas: Unremarkable. No pancreatic ductal dilatation or surrounding inflammatory changes. Spleen: Normal in size without significant abnormality. Adrenals/Urinary Tract: Adrenal glands are unremarkable. There is new moderate left, mild right hydronephrosis and hydroureter, with soft tissue obstructing the distal left ureter in the vicinity of the adnexa (series 2, image 74). There is high-grade obstruction of the distal left ureter with no excreted contrast on delayed phase imaging. Stomach/Bowel: Stomach is within normal limits. Appendix appears normal. No evidence of bowel wall thickening, distention, or inflammatory changes. Vascular/Lymphatic: No significant vascular findings are present. Interval enlargement of numerous abnormal retroperitoneal and bilateral inguinal lymph nodes, largest left retroperitoneal node measuring 1.7 x 1.0 cm (series 2, image 38). Reproductive: There is ill-defined, hypoenhancing soft tissue most clearly visualized at the left aspect of the uterus and adnexa, which  obstructs the distal left ureter, measuring approximately 4.0 x 3.4 cm (series 2, image 72). Ill-defined, hypoenhancing appearance of the cervix with loss of distinction of the fat plane to the posterior urinary bladder (series 5, image 60). Other: No abdominal wall hernia or abnormality. No abdominopelvic ascites. Musculoskeletal: No acute or significant osseous findings. IMPRESSION: 1. There is ill-defined, hypoenhancing soft tissue most clearly visualized at the left aspect of the uterus and adnexa, which obstructs the distal left ureter, measuring approximately 4.0 x 3.4 cm. This is concerning for local recurrence of cervical malignancy. 2. Ill-defined, hypoenhancing appearance of the cervix with loss of distinction of the fat plane to the posterior urinary bladder, in keeping  with primary malignancy. 3. Interval enlargement of numerous abnormal retroperitoneal and bilateral inguinal lymph nodes, consistent with nodal metastatic disease. 4. There is new moderate left, mild right hydronephrosis and hydroureter, with soft tissue described above obstructing the distal left ureter in the vicinity of the adnexa. There is high-grade obstruction of the distal left ureter with no excreted contrast on delayed phase imaging. Point of obstruction of the right ureter is not clearly visualized, possibly at the ureterovesicular junction. These results will be called to the ordering clinician or representative by the Radiologist Assistant, and communication documented in the PACS or [employer]. Electronically Signed   By: Eddie Candle M.D.   On: 05/13/2019 08:56

## 2019-05-21 NOTE — Assessment & Plan Note (Signed)
She is experiencing significant financial difficulties We will help apply for assistance to pay for her treatment

## 2019-05-21 NOTE — Assessment & Plan Note (Signed)
Her pain control is good I refill her medication today We discussed narcotic refill policy

## 2019-05-21 NOTE — Progress Notes (Signed)
Met with patient to provide resources regarding being uninsured.  Discussed there is not drug replacement assistance for generic treatment drugs and that she automatically receives a 56% discount for being uninsured for services billed through [facility] Advised she must apply for Medicaid and be denied to apply for financial assistance through [facility]. Advised her she may then complete the financial assistance application and provide denial letter with the application.  Advised her the Medicaid application must be turned in to DSS in her county. Gave her a [place] application for patients whom live in [place] if interested in applying for financial assistance through them.    Gave her [name]'s card for any additional financial questions or concerns.

## 2019-05-21 NOTE — Assessment & Plan Note (Signed)
We have extensive discussion about goals of care The patient is informed that her disease is considered incurable although still treatable

## 2019-05-22 ENCOUNTER — Other Ambulatory Visit (HOSPITAL_COMMUNITY)
Admission: RE | Admit: 2019-05-22 | Discharge: 2019-05-22 | Disposition: A | Discharge status: Home / Self Care | Payer: HRSA Program | Source: Ambulatory Visit | Attending: Urology | Admitting: Urology

## 2019-05-22 DIAGNOSIS — Z20822 Contact with and (suspected) exposure to covid-19: Secondary | ICD-10-CM | POA: Insufficient documentation

## 2019-05-22 DIAGNOSIS — Z01812 Encounter for preprocedural laboratory examination: Secondary | ICD-10-CM | POA: Diagnosis present

## 2019-05-22 LAB — SARS CORONAVIRUS 2 (TAT 6-24 HRS): SARS Coronavirus 2: NEGATIVE

## 2019-05-24 ENCOUNTER — Other Ambulatory Visit: Payer: Self-pay

## 2019-05-24 ENCOUNTER — Other Ambulatory Visit: Payer: Self-pay | Admitting: Hematology and Oncology

## 2019-05-24 ENCOUNTER — Telehealth: Payer: Self-pay | Admitting: Hematology and Oncology

## 2019-05-24 ENCOUNTER — Ambulatory Visit: Payer: Self-pay

## 2019-05-24 ENCOUNTER — Encounter (HOSPITAL_COMMUNITY): Payer: Self-pay | Admitting: Urology

## 2019-05-24 NOTE — Telephone Encounter (Signed)
Scheduled appts per 4/9 sch msg. Pt confirmed appt dates and times.

## 2019-05-25 ENCOUNTER — Telehealth: Payer: Self-pay | Admitting: Hematology and Oncology

## 2019-05-25 ENCOUNTER — Ambulatory Visit: Payer: Self-pay | Admitting: Gynecologic Oncology

## 2019-05-25 NOTE — Telephone Encounter (Signed)
Faxed medical records to [facility] at [phone]. Release ID: [id number]

## 2019-05-25 NOTE — Anesthesia Preprocedure Evaluation (Addendum)
Anesthesia Evaluation  Patient identified by MRN, date of birth, ID band Patient awake    Reviewed: Allergy & Precautions, NPO status , Patient's Chart, lab work & pertinent test results  History of Anesthesia Complications Negative for: history of anesthetic complications  Airway Mallampati: I  TM Distance: >3 FB Neck ROM: Full    Dental no notable dental hx.    Pulmonary neg pulmonary ROS,    Pulmonary exam normal        Cardiovascular negative cardio ROS Normal cardiovascular exam     Neuro/Psych  Headaches, ADHD   GI/Hepatic negative GI ROS, Neg liver ROS,   Endo/Other  negative endocrine ROS  Renal/GU Bilateral hydronephrosis, Cr 1.18  negative genitourinary   Musculoskeletal negative musculoskeletal ROS (+)   Abdominal   Peds  Hematology  (+) anemia , Hgb 10.5   Anesthesia Other Findings Day of surgery medications reviewed with patient.  Reproductive/Obstetrics Cervical ca s/p chemo/radiation                            Anesthesia Physical Anesthesia Plan  ASA: II  Anesthesia Plan: General   Post-op Pain Management:    Induction: Intravenous  PONV Risk Score and Plan: 3 and Treatment may vary due to age or medical condition, Midazolam, Ondansetron and Dexamethasone  Airway Management Planned: LMA  Additional Equipment: None  Intra-op Plan:   Post-operative Plan: Extubation in OR  Informed Consent: I have reviewed the patients History and Physical, chart, labs and discussed the procedure including the risks, benefits and alternatives for the proposed anesthesia with the patient or authorized representative who has indicated his/her understanding and acceptance.     Dental advisory given  Plan Discussed with: CRNA  Anesthesia Plan Comments:        Anesthesia Quick Evaluation

## 2019-05-26 ENCOUNTER — Encounter (HOSPITAL_COMMUNITY)
Admission: RE | Disposition: A | Discharge status: Home / Self Care | Payer: Self-pay | Source: Home / Self Care | Attending: Urology

## 2019-05-26 ENCOUNTER — Ambulatory Visit (HOSPITAL_COMMUNITY)
Admission: RE | Admit: 2019-05-26 | Discharge: 2019-05-26 | Disposition: A | Discharge status: Home / Self Care | Payer: Medicaid Other | Attending: Urology | Admitting: Urology

## 2019-05-26 ENCOUNTER — Ambulatory Visit (HOSPITAL_COMMUNITY): Payer: Medicaid Other

## 2019-05-26 ENCOUNTER — Encounter (HOSPITAL_COMMUNITY): Payer: Self-pay | Admitting: Urology

## 2019-05-26 ENCOUNTER — Ambulatory Visit (HOSPITAL_COMMUNITY): Payer: Medicaid Other | Admitting: Anesthesiology

## 2019-05-26 ENCOUNTER — Other Ambulatory Visit: Payer: Self-pay

## 2019-05-26 DIAGNOSIS — K580 Irritable bowel syndrome with diarrhea: Secondary | ICD-10-CM | POA: Insufficient documentation

## 2019-05-26 DIAGNOSIS — Z8249 Family history of ischemic heart disease and other diseases of the circulatory system: Secondary | ICD-10-CM | POA: Diagnosis not present

## 2019-05-26 DIAGNOSIS — Z800 Family history of malignant neoplasm of digestive organs: Secondary | ICD-10-CM | POA: Diagnosis not present

## 2019-05-26 DIAGNOSIS — Z833 Family history of diabetes mellitus: Secondary | ICD-10-CM | POA: Diagnosis not present

## 2019-05-26 DIAGNOSIS — R1032 Left lower quadrant pain: Secondary | ICD-10-CM | POA: Insufficient documentation

## 2019-05-26 DIAGNOSIS — C539 Malignant neoplasm of cervix uteri, unspecified: Secondary | ICD-10-CM | POA: Diagnosis not present

## 2019-05-26 DIAGNOSIS — R351 Nocturia: Secondary | ICD-10-CM | POA: Diagnosis not present

## 2019-05-26 DIAGNOSIS — G43909 Migraine, unspecified, not intractable, without status migrainosus: Secondary | ICD-10-CM | POA: Insufficient documentation

## 2019-05-26 DIAGNOSIS — D61818 Other pancytopenia: Secondary | ICD-10-CM | POA: Diagnosis not present

## 2019-05-26 DIAGNOSIS — N131 Hydronephrosis with ureteral stricture, not elsewhere classified: Secondary | ICD-10-CM | POA: Insufficient documentation

## 2019-05-26 DIAGNOSIS — F909 Attention-deficit hyperactivity disorder, unspecified type: Secondary | ICD-10-CM | POA: Insufficient documentation

## 2019-05-26 DIAGNOSIS — Z419 Encounter for procedure for purposes other than remedying health state, unspecified: Secondary | ICD-10-CM

## 2019-05-26 DIAGNOSIS — M7989 Other specified soft tissue disorders: Secondary | ICD-10-CM | POA: Insufficient documentation

## 2019-05-26 DIAGNOSIS — Z923 Personal history of irradiation: Secondary | ICD-10-CM | POA: Insufficient documentation

## 2019-05-26 DIAGNOSIS — N1330 Unspecified hydronephrosis: Secondary | ICD-10-CM | POA: Diagnosis present

## 2019-05-26 DIAGNOSIS — D649 Anemia, unspecified: Secondary | ICD-10-CM | POA: Insufficient documentation

## 2019-05-26 DIAGNOSIS — R590 Localized enlarged lymph nodes: Secondary | ICD-10-CM | POA: Insufficient documentation

## 2019-05-26 DIAGNOSIS — Z9221 Personal history of antineoplastic chemotherapy: Secondary | ICD-10-CM | POA: Insufficient documentation

## 2019-05-26 DIAGNOSIS — N179 Acute kidney failure, unspecified: Secondary | ICD-10-CM | POA: Diagnosis not present

## 2019-05-26 HISTORY — PX: CYSTOSCOPY WITH STENT PLACEMENT: SHX5790

## 2019-05-26 SURGERY — CYSTOSCOPY, WITH STENT INSERTION
Anesthesia: General | Laterality: Bilateral

## 2019-05-26 MED ORDER — FENTANYL CITRATE (PF) 100 MCG/2ML IJ SOLN
INTRAMUSCULAR | Status: AC
  Filled 2019-05-26: qty 2

## 2019-05-26 MED ORDER — PHENAZOPYRIDINE HCL 200 MG PO TABS: 200.0000 mg | ORAL_TABLET | Freq: Three times a day (TID) | ORAL | 0 refills | Status: DC | PRN

## 2019-05-26 MED ORDER — ONDANSETRON HCL 4 MG/2ML IJ SOLN
INTRAMUSCULAR | Status: AC
  Filled 2019-05-26: qty 2

## 2019-05-26 MED ORDER — OXYBUTYNIN CHLORIDE 5 MG PO TABS: 5.0000 mg | ORAL_TABLET | Freq: Three times a day (TID) | ORAL | 1 refills | Status: AC | PRN

## 2019-05-26 MED ORDER — FENTANYL CITRATE (PF) 100 MCG/2ML IJ SOLN
INTRAMUSCULAR | Status: DC | PRN
  Administered 2019-05-26 (×3): 25 ug via INTRAVENOUS

## 2019-05-26 MED ORDER — ONDANSETRON HCL 4 MG/2ML IJ SOLN
INTRAMUSCULAR | Status: DC | PRN
  Administered 2019-05-26: 4 mg via INTRAVENOUS

## 2019-05-26 MED ORDER — DEXAMETHASONE SODIUM PHOSPHATE 10 MG/ML IJ SOLN
INTRAMUSCULAR | Status: AC
  Filled 2019-05-26: qty 1

## 2019-05-26 MED ORDER — FENTANYL CITRATE (PF) 100 MCG/2ML IJ SOLN: 25.0000 ug | INTRAMUSCULAR | Status: DC | PRN

## 2019-05-26 MED ORDER — KETOROLAC TROMETHAMINE 15 MG/ML IJ SOLN
INTRAMUSCULAR | Status: DC | PRN
  Administered 2019-05-26: 15 mg via INTRAVENOUS

## 2019-05-26 MED ORDER — OXYCODONE HCL 5 MG/5ML PO SOLN: 5.0000 mg | Freq: Once | ORAL | Status: DC | PRN

## 2019-05-26 MED ORDER — OXYCODONE HCL 5 MG PO TABS: 5.0000 mg | ORAL_TABLET | Freq: Once | ORAL | Status: DC | PRN

## 2019-05-26 MED ORDER — MIDAZOLAM HCL 5 MG/5ML IJ SOLN
INTRAMUSCULAR | Status: DC | PRN
  Administered 2019-05-26: 2 mg via INTRAVENOUS

## 2019-05-26 MED ORDER — CEFAZOLIN SODIUM-DEXTROSE 2-4 GM/100ML-% IV SOLN
2.0000 g | Freq: Once | INTRAVENOUS | Status: AC
  Administered 2019-05-26: 2 g via INTRAVENOUS
  Filled 2019-05-26: qty 100

## 2019-05-26 MED ORDER — PROPOFOL 10 MG/ML IV BOLUS
INTRAVENOUS | Status: DC | PRN
  Administered 2019-05-26: 120 mg via INTRAVENOUS

## 2019-05-26 MED ORDER — IOHEXOL 300 MG/ML  SOLN
INTRAMUSCULAR | Status: DC | PRN
  Administered 2019-05-26: 22 mL

## 2019-05-26 MED ORDER — PROMETHAZINE HCL 25 MG/ML IJ SOLN: 6.2500 mg | INTRAMUSCULAR | Status: DC | PRN

## 2019-05-26 MED ORDER — KETOROLAC TROMETHAMINE 30 MG/ML IJ SOLN
INTRAMUSCULAR | Status: AC
  Filled 2019-05-26: qty 1

## 2019-05-26 MED ORDER — PROPOFOL 10 MG/ML IV BOLUS
INTRAVENOUS | Status: AC
  Filled 2019-05-26: qty 20

## 2019-05-26 MED ORDER — LIDOCAINE 2% (20 MG/ML) 5 ML SYRINGE
INTRAMUSCULAR | Status: AC
  Filled 2019-05-26: qty 5

## 2019-05-26 MED ORDER — LIDOCAINE 2% (20 MG/ML) 5 ML SYRINGE
INTRAMUSCULAR | Status: DC | PRN
  Administered 2019-05-26: 60 mg via INTRAVENOUS

## 2019-05-26 MED ORDER — MIDAZOLAM HCL 2 MG/2ML IJ SOLN
INTRAMUSCULAR | Status: AC
  Filled 2019-05-26: qty 2

## 2019-05-26 MED ORDER — DEXAMETHASONE SODIUM PHOSPHATE 10 MG/ML IJ SOLN
INTRAMUSCULAR | Status: DC | PRN
  Administered 2019-05-26: 6 mg via INTRAVENOUS

## 2019-05-26 MED ORDER — ACETAMINOPHEN 500 MG PO TABS
1000.0000 mg | ORAL_TABLET | Freq: Once | ORAL | Status: AC
  Administered 2019-05-26: 1000 mg via ORAL
  Filled 2019-05-26: qty 2

## 2019-05-26 MED ORDER — CEPHALEXIN 500 MG PO CAPS: 500.0000 mg | ORAL_CAPSULE | Freq: Two times a day (BID) | ORAL | 0 refills | Status: AC

## 2019-05-26 MED ORDER — LACTATED RINGERS IV SOLN: INTRAVENOUS | Status: DC

## 2019-05-26 MED ORDER — SODIUM CHLORIDE 0.9 % IR SOLN
Status: DC | PRN
  Administered 2019-05-26: 6000 mL via INTRAVESICAL

## 2019-05-26 MED FILL — CEPHALEXIN 500 MG CAPSULE: 500 | 3 days supply | Qty: 6 | Fill #0

## 2019-05-26 MED FILL — PHENAZOPYRIDINE 200 MG TAB: 200 | 10 days supply | Qty: 30 | Fill #0

## 2019-05-26 MED FILL — OXYBUTYNIN CHLORIDE 5 MG TA: 5 | 10 days supply | Qty: 30 | Fill #0

## 2019-05-26 SURGICAL SUPPLY — 13 items
BAG URO CATCHER STRL LF (MISCELLANEOUS) ×3 IMPLANT
CATH URET 5FR 28IN OPEN ENDED (CATHETERS) ×6 IMPLANT
CLOTH BEACON ORANGE TIMEOUT ST (SAFETY) ×3 IMPLANT
GLOVE BIOGEL M STRL SZ7.5 (GLOVE) ×3 IMPLANT
GOWN STRL REUS W/TWL XL LVL3 (GOWN DISPOSABLE) ×3 IMPLANT
GUIDEWIRE STR DUAL SENSOR (WIRE) IMPLANT
GUIDEWIRE ZIPWRE .038 STRAIGHT (WIRE) ×3 IMPLANT
KIT TURNOVER KIT A (KITS) IMPLANT
MANIFOLD NEPTUNE II (INSTRUMENTS) ×3 IMPLANT
PACK CYSTO (CUSTOM PROCEDURE TRAY) ×3 IMPLANT
STENT URET 6FRX24 CONTOUR (STENTS) ×6 IMPLANT
TUBING CONNECTING 10 (TUBING) ×2 IMPLANT
TUBING CONNECTING 10' (TUBING) ×1

## 2019-05-26 NOTE — H&P (Signed)
Urology Preoperative H&P   Chief Complaint: Bilateral ureteral obstruction  History of Present Illness: Sheena Torres is a 54 y.o. female with bilateral hydronephrosis in the setting of recurrence of cervical cancer. She does have new mild right hydronephrosis and moderate left hydronephrosis.   She has left lower quadrant pain "in region of ovary" per patient. She also has nocturia and does have some leg swelling. She states she has always gone to urinate frequently but this has increased to more frequently, now about every hour during the day. Denies urgency, incontinence, hematuria, dysuria. Occasional pain in lower left back.   Dr Alvy Bimler is planning carboplatin based palliative therapy. They have advised urology consult for stent placement prior. She does not currently have insurance and has been receiving care through a grant at [facility].   Creatinine 1.23.   UA today not concerning for infection.  Urine culture in March negative.   Past Medical History:  Diagnosis Date  . ADHD   . 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 Chemotherapy-induced nausea   . Fatigue   . Hypomagnesemia   . IBS (irritable bowel syndrome)    w/ diarrhea  . Migraine   . Pancytopenia, acquired ([place])   . Sensation of pressure in bladder area    occasional due to radiation therapy    Past Surgical History:  Procedure Laterality Date  . BREAST ENHANCEMENT SURGERY Bilateral 2017   w/ implants  . CESAREAN SECTION  2000  . IR IMAGING GUIDED PORT INSERTION  05/08/2018  . IR REMOVAL TUN ACCESS W/ PORT W/O FL MOD SED  11/16/2018  . OPERATIVE ULTRASOUND N/A 07/02/2018   Procedure: OPERATIVE ULTRASOUND;  Surgeon: Gery Pray, MD;  Location: [facility];  Service: Urology;  Laterality: N/A;  . OPERATIVE ULTRASOUND N/A 07/09/2018   Procedure: OPERATIVE ULTRASOUND;  Surgeon: Gery Pray, MD;  Location: [facility];  Service: Urology;  Laterality: N/A;  . OPERATIVE ULTRASOUND N/A 07/13/2018   Procedure: OPERATIVE ULTRASOUND;  Surgeon: Gery Pray, MD;  Location: [facility];  Service: Urology;  Laterality: N/A;  . OPERATIVE ULTRASOUND N/A 07/21/2018   Procedure: OPERATIVE ULTRASOUND;  Surgeon: Gery Pray, MD;  Location: [facility];  Service: Urology;  Laterality: N/A;  . OPERATIVE ULTRASOUND N/A 07/27/2018   Procedure: OPERATIVE ULTRASOUND;  Surgeon: Gery Pray, MD;  Location: [facility];  Service: Urology;  Laterality: N/A;  . TANDEM RING INSERTION N/A 07/02/2018   Procedure: TANDEM RING INSERTION;  Surgeon: Gery Pray, MD;  Location: [facility];  Service: Urology;  Laterality: N/A;  . TANDEM RING INSERTION N/A 07/09/2018   Procedure: TANDEM RING INSERTION;  Surgeon: Gery Pray, MD;  Location: [facility];  Service: Urology;  Laterality: N/A;  . TANDEM RING INSERTION N/A 07/13/2018   Procedure: TANDEM RING INSERTION;  Surgeon: Gery Pray, MD;  Location: [facility];  Service: Urology;  Laterality: N/A;  . TANDEM RING INSERTION N/A 07/21/2018   Procedure: TANDEM RING INSERTION;  Surgeon: Gery Pray, MD;  Location: [facility];  Service: Urology;  Laterality: N/A;  . TANDEM RING INSERTION N/A 07/27/2018   Procedure: TANDEM RING INSERTION;  Surgeon: Gery Pray, MD;  Location: [facility];  Service: Urology;  Laterality: N/A;    Allergies: No Known Allergies  Family History  Problem Relation Age of  Onset  . Heart disease Mother   . Diabetes Father   . Heart disease Father   . Cancer Maternal Grandmother        mouth/jaw cancer    Social History:  reports that she has never smoked. She has never used smokeless tobacco. She reports previous alcohol use. She reports that she does not use drugs.  ROS: A complete review of  systems was performed.  All systems are negative except for pertinent findings as noted.  Physical Exam:  Vital signs in last 24 hours:   Constitutional:  Alert and oriented, No acute distress Cardiovascular: Regular rate and rhythm, No JVD Respiratory: Normal respiratory effort, Lungs clear bilaterally GI: Abdomen is soft, nontender, nondistended, no abdominal masses GU: No CVA tenderness Lymphatic: No lymphadenopathy Neurologic: Grossly intact, no focal deficits Psychiatric: Normal mood and affect  Laboratory Data:  No results for input(s): WBC, HGB, HCT, PLT in the last 72 hours.  No results for input(s): NA, K, CL, GLUCOSE, BUN, CALCIUM, CREATININE in the last 72 hours.  Invalid input(s): CO3   No results found for this or any previous visit (from the past 24 hour(s)). Recent Results (from the past 240 hour(s))  SARS CORONAVIRUS 2 (TAT 6-24 HRS) Nasopharyngeal Nasopharyngeal Swab     Status: None   Collection Time: 05/22/19  1:20 PM   Specimen: Nasopharyngeal Swab  Result Value Ref Range Status   SARS Coronavirus 2 NEGATIVE NEGATIVE Final    Comment: (NOTE) SARS-CoV-2 target nucleic acids are NOT DETECTED. The SARS-CoV-2 RNA is generally detectable in upper and lower respiratory specimens during the acute phase of infection. Negative results do not preclude SARS-CoV-2 infection, do not rule out co-infections with other pathogens, and should not be used as the sole basis for treatment or other patient management decisions. Negative results must be combined with clinical observations, patient history, and epidemiological information. The expected result is Negative. Fact Sheet for Patients: SugarRoll.be Fact Sheet for Healthcare Providers: https://www.woods-mathews.com/ This test is not yet approved or cleared by the [place] FDA and  has been authorized for detection and/or diagnosis of SARS-CoV-2 by FDA under an Emergency  Use Authorization (EUA). This EUA will remain  in effect (meaning this test can be used) for the duration of the COVID-19 declaration under Section 56 4(b)(1) of the Act, 21 U.S.C. section 360bbb-3(b)(1), unless the authorization is terminated or revoked sooner. Performed at [facility], [place] [address] [place], [place] [address]     Renal Function: Recent Labs    05/21/19 1505  CREATININE 1.18*   Estimated Creatinine Clearance: 49.2 mL/min (A) (by C-G formula based on SCr of 1.18 mg/dL (H)).  Radiologic Imaging: CLINICAL DATA:  Cervical cancer, s/p chemo and XRT, assess treatment response, pelvic swelling, bloating, left lower quadrant pain  EXAM: CT ABDOMEN AND PELVIS WITH CONTRAST  TECHNIQUE: Multidetector CT imaging of the abdomen and pelvis was performed using the standard protocol following bolus administration of intravenous contrast.  CONTRAST:  126mL OMNIPAQUE IOHEXOL 300 MG/ML SOLN, additional oral enteric contrast  COMPARISON:  PET-CT, 10/27/2018, 04/24/2018, MR pelvis, 04/27/2018  FINDINGS: Lower chest: No acute abnormality.  Hepatobiliary: No solid liver abnormality is seen. No gallstones, gallbladder wall thickening, or biliary dilatation.  Pancreas: Unremarkable. No pancreatic ductal dilatation or surrounding inflammatory changes.  Spleen: Normal in size without significant abnormality.  Adrenals/Urinary Tract: Adrenal glands are unremarkable. There is new moderate left, mild right hydronephrosis and hydroureter, with soft tissue obstructing the distal left ureter in  the vicinity of the adnexa (series 2, image 74). There is high-grade obstruction of the distal left ureter with no excreted contrast on delayed phase imaging.  Stomach/Bowel: Stomach is within normal limits. Appendix appears normal. No evidence of bowel wall thickening, distention, or inflammatory changes.  Vascular/Lymphatic: No significant vascular findings are  present. Interval enlargement of numerous abnormal retroperitoneal and bilateral inguinal lymph nodes, largest left retroperitoneal node measuring 1.7 x 1.0 cm (series 2, image 38).  Reproductive: There is ill-defined, hypoenhancing soft tissue most clearly visualized at the left aspect of the uterus and adnexa, which obstructs the distal left ureter, measuring approximately 4.0 x 3.4 cm (series 2, image 72). Ill-defined, hypoenhancing appearance of the cervix with loss of distinction of the fat plane to the posterior urinary bladder (series 5, image 60).  Other: No abdominal wall hernia or abnormality. No abdominopelvic ascites.  Musculoskeletal: No acute or significant osseous findings.  IMPRESSION: 1. There is ill-defined, hypoenhancing soft tissue most clearly visualized at the left aspect of the uterus and adnexa, which obstructs the distal left ureter, measuring approximately 4.0 x 3.4 cm. This is concerning for local recurrence of cervical malignancy.  2. Ill-defined, hypoenhancing appearance of the cervix with loss of distinction of the fat plane to the posterior urinary bladder, in keeping with primary malignancy.  3. Interval enlargement of numerous abnormal retroperitoneal and bilateral inguinal lymph nodes, consistent with nodal metastatic disease.  4. There is new moderate left, mild right hydronephrosis and hydroureter, with soft tissue described above obstructing the distal left ureter in the vicinity of the adnexa. There is high-grade obstruction of the distal left ureter with no excreted contrast on delayed phase imaging. Point of obstruction of the right ureter is not clearly visualized, possibly at the ureterovesicular junction.  These results will be called to the ordering clinician or representative by the Radiologist Assistant, and communication documented in the PACS or [employer].   Electronically Signed   By: Eddie Candle M.D.    On: 05/13/2019 08:56 I independently reviewed the above imaging studies.  Assessment and Plan Sheena Torres is a 54 y.o. female with stage IV cervical cancer causing bilateral ureteral obstruction and acute kidney injury  -The risks, benefits and alternatives of cystoscopy with BILATERAL JJ stent placement was discussed with the patient.  Risks include, but are not limited to: bleeding, urinary tract infection, ureteral injury, ureteral stricture disease, chronic pain, urinary symptoms, bladder injury, stent migration, the need for nephrostomy tube placement, MI, CVA, DVT, PE and the inherent risks with general anesthesia.  The patient voices understanding and wishes to proceed.    Ellison Hughs, MD 05/26/2019, 8:06 AM  [facility] Pager: [phone]

## 2019-05-26 NOTE — Transfer of Care (Signed)
Immediate Anesthesia Transfer of Care Note  Patient: Sheena Torres  Procedure(s) Performed: CYSTOSCOPY WITH BILATERAL  STENT PLACEMENT (Bilateral )  Patient Location: PACU  Anesthesia Type:General  Level of Consciousness: awake, alert  and oriented  Airway & Oxygen Therapy: Patient Spontanous Breathing and Patient connected to face mask oxygen  Post-op Assessment: Report given to RN and Post -op Vital signs reviewed and stable  Post vital signs: Reviewed and stable  Last Vitals:  Vitals Value Taken Time  BP 140/84 05/26/19 1602  Temp    Pulse 80 05/26/19 1603  Resp 15 05/26/19 1603  SpO2 100 % 05/26/19 1603  Vitals shown include unvalidated device data.  Last Pain:  Vitals:   05/26/19 1355  TempSrc: Oral  PainSc:       Patients Stated Pain Goal: 4 ([id number])  Complications: No apparent anesthesia complications

## 2019-05-26 NOTE — Op Note (Signed)
Operative Note  Preoperative diagnosis:  1.  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  Bilateral ureteral obstruction secondary to stage IV cervical cancer  Procedure(s): 1.  Cystoscopy with bilateral ureteral stent placement 2.  Bilateral retrograde pyelograms with intraoperative interpretation of fluoroscopic imaging  Surgeon: Ellison Hughs, MD  Assistants:  None  Anesthesia:  General  Complications:  None  EBL: 5 mL  Specimens: 1.  None  Drains/Catheters: 1.  Bilateral 6 French, 24 cm JJ stents without tethers  Intraoperative findings:   1. Left retrograde pyelogram revealed severe stenosis of the distal aspects of the left ureter starting at the inferior portion of the sacroiliac joint and extending down to the ureteral orifice.  The proximal aspects of the left ureter was uniformly dilated.  Contrast could not reach the left renal pelvis, so was not evaluated. 2. Right retrograde pyelogram revealed mild uniform dilation of the entire length the right ureter along with dilation of the right renal pelvis and its associated calyces.  No filling defects were identified on retrograde pyelogram.  Indication:  Sheena Torres is a 54 y.o. female with bilateral ureteral obstruction secondary to stage IV cervical cancer.  She has been consented for the above procedures, voices understanding and wishes to proceed.  Description of procedure:  After informed consent was obtained, the patient was brought to the operating room and general LMA anesthesia was administered. The patient was then placed in the dorsolithotomy position and prepped and draped in the usual sterile fashion. A timeout was performed. A 23 French rigid cystoscope was then inserted into the urethral meatus and advanced into the bladder under direct vision. A complete bladder survey revealed no intravesical pathology.  A 5 French ureteral catheter was then  inserted into the left ureteral orifice and a retrograde pyelogram was obtained, with the findings listed above.  A Glidewire was then used to intubate the lumen of the ureteral catheter and was advanced up to the left renal pelvis, under fluoroscopic guidance.  The catheter was then removed, leaving the wire in place.  A 6 French, 24 cm JJ stent was then placed over the wire and into good position within the left collecting system, confirming placement via fluoroscopy.  A 5 French ureteral catheter was then inserted into the right ureteral orifice and a retrograde pyelogram was obtained, with the findings listed above.  A Glidewire was then used to intubate the lumen of the ureteral catheter and was advanced up to the right renal pelvis, under fluoroscopic guidance.  The catheter was then removed, leaving the wire in place.  A 6 French, 24 cm JJ stent was then placed over the wire and into good position within the right collecting system, confirming placement via fluoroscopy.  The patient's bladder was drained.  She tolerated the procedure well and was transferred to the postanesthesia in stable condition.  Plan: Follow-up in approximately 3 months to schedule her next stent exchange  Cc: Dr. Heath Lark

## 2019-05-26 NOTE — Anesthesia Postprocedure Evaluation (Signed)
Anesthesia Post Note  Patient: Sheena Torres  Procedure(s) Performed: CYSTOSCOPY WITH BILATERAL  STENT PLACEMENT (Bilateral )     Patient location during evaluation: PACU Anesthesia Type: General Level of consciousness: awake and alert and oriented Pain management: pain level controlled Vital Signs Assessment: post-procedure vital signs reviewed and stable Respiratory status: spontaneous breathing, nonlabored ventilation and respiratory function stable Cardiovascular status: blood pressure returned to baseline Postop Assessment: no apparent nausea or vomiting Anesthetic complications: no    Last Vitals:  Vitals:   05/26/19 1355 05/26/19 1602  BP: 130/83 140/84  Pulse: 74 84  Resp: 18 11  Temp: 36.8 C 36.4 C  SpO2: 98% 100%    Last Pain:  Vitals:   05/26/19 1602  TempSrc:   PainSc: 0-No pain                 Brennan Bailey

## 2019-05-26 NOTE — Anesthesia Procedure Notes (Addendum)
Procedure Name: LMA Insertion Date/Time: 05/26/2019 3:31 PM Performed by: Sharlette Dense, CRNA Patient Re-evaluated:Patient Re-evaluated prior to induction Oxygen Delivery Method: Circle system utilized Preoxygenation: Pre-oxygenation with 100% oxygen Induction Type: IV induction Ventilation: Mask ventilation without difficulty LMA: LMA inserted LMA Size: 4.0 Number of attempts: 1 Placement Confirmation: positive ETCO2 and breath sounds checked- equal and bilateral Tube secured with: Tape Dental Injury: Teeth and Oropharynx as per pre-operative assessment

## 2019-05-26 NOTE — Progress Notes (Signed)
Pharmacist Chemotherapy Monitoring - Initial Assessment    Anticipated start date: 05/28/19   Regimen:  . Are orders appropriate based on the patient's diagnosis, regimen, and cycle? Yes . Does the plan date match the patient's scheduled date? Yes . Is the sequencing of drugs appropriate? Yes . Are the premedications appropriate for the patient's regimen? Yes . Prior Authorization for treatment is: Self pay - no patient assistance available for generic medications  o If applicable, is the correct biosimilar selected based on the patient's insurance? not applicable  Organ Function and Labs: Marland Kitchen Are dose adjustments needed based on the patient's renal function, hepatic function, or hematologic function? No . Are appropriate labs ordered prior to the start of patient's treatment? Yes . Other organ system assessment, if indicated: N/A . The following baseline labs, if indicated, have been ordered: N/A  Dose Assessment: . Are the drug doses appropriate? Yes . Are the following correct: o Drug concentrations Yes o IV fluid compatible with drug Yes o Administration routes Yes o Timing of therapy Yes . If applicable, does the patient have documented access for treatment and/or plans for port-a-cath placement? yes . If applicable, have lifetime cumulative doses been properly documented and assessed? yes Lifetime Dose Tracking  No doses have been documented on this patient for the following tracked chemicals: Doxorubicin, Epirubicin, Idarubicin, Daunorubicin, Mitoxantrone, Bleomycin, Oxaliplatin, Carboplatin, Liposomal Doxorubicin  o   Toxicity Monitoring/Prevention: . The patient has the following take home antiemetics prescribed: Ondansetron, Prochlorperazine and Dexamethasone . The patient has the following take home medications prescribed: N/A . Medication allergies and previous infusion related reactions, if applicable, have been reviewed and addressed. Yes . The patient's current  medication list has been assessed for drug-drug interactions with their chemotherapy regimen. no significant drug-drug interactions were identified on review.  Order Review: . Are the treatment plan orders signed? Yes . Is the patient scheduled to see a provider prior to their treatment? No  I verify that I have reviewed each item in the above checklist and answered each question accordingly.  Sheena Torres [facility] 05/26/2019 11:36 AM

## 2019-05-28 ENCOUNTER — Other Ambulatory Visit: Payer: Self-pay

## 2019-05-28 ENCOUNTER — Inpatient Hospital Stay: Payer: Medicaid Other

## 2019-05-28 VITALS — BP 120/81 | HR 58 | Temp 98.0°F | Resp 18

## 2019-05-28 DIAGNOSIS — C530 Malignant neoplasm of endocervix: Secondary | ICD-10-CM | POA: Diagnosis not present

## 2019-05-28 MED ORDER — SODIUM CHLORIDE 0.9 % IV SOLN
Freq: Once | INTRAVENOUS | Status: AC
  Filled 2019-05-28: qty 250

## 2019-05-28 MED ORDER — FAMOTIDINE IN NACL 20-0.9 MG/50ML-% IV SOLN
INTRAVENOUS | Status: AC
  Filled 2019-05-28: qty 50

## 2019-05-28 MED ORDER — PALONOSETRON HCL INJECTION 0.25 MG/5ML
INTRAVENOUS | Status: AC
  Filled 2019-05-28: qty 5

## 2019-05-28 MED ORDER — SODIUM CHLORIDE 0.9 % IV SOLN
414.5000 mg | Freq: Once | INTRAVENOUS | Status: AC
  Administered 2019-05-28: 410 mg via INTRAVENOUS
  Filled 2019-05-28: qty 41

## 2019-05-28 MED ORDER — SODIUM CHLORIDE 0.9 % IV SOLN
150.0000 mg | Freq: Once | INTRAVENOUS | Status: AC
  Administered 2019-05-28: 150 mg via INTRAVENOUS
  Filled 2019-05-28: qty 150

## 2019-05-28 MED ORDER — SODIUM CHLORIDE 0.9 % IV SOLN
175.0000 mg/m2 | Freq: Once | INTRAVENOUS | Status: AC
  Administered 2019-05-28: 300 mg via INTRAVENOUS
  Filled 2019-05-28: qty 50

## 2019-05-28 MED ORDER — DIPHENHYDRAMINE HCL 50 MG/ML IJ SOLN
INTRAMUSCULAR | Status: AC
  Filled 2019-05-28: qty 1

## 2019-05-28 MED ORDER — DIPHENHYDRAMINE HCL 50 MG/ML IJ SOLN
50.0000 mg | Freq: Once | INTRAMUSCULAR | Status: AC
  Administered 2019-05-28: 50 mg via INTRAVENOUS

## 2019-05-28 MED ORDER — PALONOSETRON HCL INJECTION 0.25 MG/5ML
0.2500 mg | Freq: Once | INTRAVENOUS | Status: AC
  Administered 2019-05-28: 0.25 mg via INTRAVENOUS

## 2019-05-28 MED ORDER — SODIUM CHLORIDE 0.9 % IV SOLN
10.0000 mg | Freq: Once | INTRAVENOUS | Status: AC
  Administered 2019-05-28: 10 mg via INTRAVENOUS
  Filled 2019-05-28: qty 10

## 2019-05-28 MED ORDER — FAMOTIDINE IN NACL 20-0.9 MG/50ML-% IV SOLN
20.0000 mg | Freq: Once | INTRAVENOUS | Status: AC
  Administered 2019-05-28: 20 mg via INTRAVENOUS

## 2019-05-28 NOTE — Patient Instructions (Signed)
[facility] Discharge Instructions for Patients Receiving Chemotherapy  Today you received the following chemotherapy agents taxol/carboplatin   To help prevent nausea and vomiting after your treatment, we encourage you to take your nausea medication as directed.     If you develop nausea and vomiting that is not controlled by your nausea medication, call the clinic.   BELOW ARE SYMPTOMS THAT SHOULD BE REPORTED IMMEDIATELY:  *FEVER GREATER THAN 100.5 F  *CHILLS WITH OR WITHOUT FEVER  NAUSEA AND VOMITING THAT IS NOT CONTROLLED WITH YOUR NAUSEA MEDICATION  *UNUSUAL SHORTNESS OF BREATH  *UNUSUAL BRUISING OR BLEEDING  TENDERNESS IN MOUTH AND THROAT WITH OR WITHOUT PRESENCE OF ULCERS  *URINARY PROBLEMS  *BOWEL PROBLEMS  UNUSUAL RASH Items with * indicate a potential emergency and should be followed up as soon as possible.  Feel free to call the clinic you have any questions or concerns. The clinic phone number is [phone].  Paclitaxel injection What is this medicine? PACLITAXEL (PAK li TAX el) is a chemotherapy drug. It targets fast dividing cells, like cancer cells, and causes these cells to die. This medicine is used to treat ovarian cancer, breast cancer, lung cancer, Kaposi's sarcoma, and other cancers. This medicine may be used for other purposes; ask your health care provider or pharmacist if you have questions. COMMON BRAND NAME(S): Onxol, Taxol What should I tell my health care provider before I take this medicine? They need to know if you have any of these conditions:  history of irregular heartbeat  liver disease  low blood counts, like low white cell, platelet, or red cell counts  lung or breathing disease, like asthma  tingling of the fingers or toes, or other nerve disorder  an unusual or allergic reaction to paclitaxel, alcohol, polyoxyethylated castor oil, other chemotherapy, other medicines, foods, dyes, or preservatives  pregnant or  trying to get pregnant  breast-feeding How should I use this medicine? This drug is given as an infusion into a vein. It is administered in a hospital or clinic by a specially trained health care professional. Talk to your pediatrician regarding the use of this medicine in children. Special care may be needed. Overdosage: If you think you have taken too much of this medicine contact a poison control center or emergency room at once. NOTE: This medicine is only for you. Do not share this medicine with others. What if I miss a dose? It is important not to miss your dose. Call your doctor or health care professional if you are unable to keep an appointment. What may interact with this medicine? Do not take this medicine with any of the following medications:  disulfiram  metronidazole This medicine may also interact with the following medications:  antiviral medicines for hepatitis, HIV or AIDS  certain antibiotics like erythromycin and clarithromycin  certain medicines for fungal infections like ketoconazole and itraconazole  certain medicines for seizures like carbamazepine, phenobarbital, phenytoin  gemfibrozil  nefazodone  rifampin  St. John's wort This list may not describe all possible interactions. Give your health care provider a list of all the medicines, herbs, non-prescription drugs, or dietary supplements you use. Also tell them if you smoke, drink alcohol, or use illegal drugs. Some items may interact with your medicine. What should I watch for while using this medicine? Your condition will be monitored carefully while you are receiving this medicine. You will need important blood work done while you are taking this medicine. This medicine can cause serious allergic reactions.   To reduce your risk you will need to take other medicine(s) before treatment with this medicine. If you experience allergic reactions like skin rash, itching or hives, swelling of the face, lips,  or tongue, tell your doctor or health care professional right away. In some cases, you may be given additional medicines to help with side effects. Follow all directions for their use. This drug may make you feel generally unwell. This is not uncommon, as chemotherapy can affect healthy cells as well as cancer cells. Report any side effects. Continue your course of treatment even though you feel ill unless your doctor tells you to stop. Call your doctor or health care professional for advice if you get a fever, chills or sore throat, or other symptoms of a cold or flu. Do not treat yourself. This drug decreases your body's ability to fight infections. Try to avoid being around people who are sick. This medicine may increase your risk to bruise or bleed. Call your doctor or health care professional if you notice any unusual bleeding. Be careful brushing and flossing your teeth or using a toothpick because you may get an infection or bleed more easily. If you have any dental work done, tell your dentist you are receiving this medicine. Avoid taking products that contain aspirin, acetaminophen, ibuprofen, naproxen, or ketoprofen unless instructed by your doctor. These medicines may hide a fever. Do not become pregnant while taking this medicine. Women should inform their doctor if they wish to become pregnant or think they might be pregnant. There is a potential for serious side effects to an unborn child. Talk to your health care professional or pharmacist for more information. Do not breast-feed an infant while taking this medicine. Men are advised not to father a child while receiving this medicine. This product may contain alcohol. Ask your pharmacist or healthcare provider if this medicine contains alcohol. Be sure to tell all healthcare providers you are taking this medicine. Certain medicines, like metronidazole and disulfiram, can cause an unpleasant reaction when taken with alcohol. The reaction  includes flushing, headache, nausea, vomiting, sweating, and increased thirst. The reaction can last from 30 minutes to several hours. What side effects may I notice from receiving this medicine? Side effects that you should report to your doctor or health care professional as soon as possible:  allergic reactions like skin rash, itching or hives, swelling of the face, lips, or tongue  breathing problems  changes in vision  fast, irregular heartbeat  high or low blood pressure  mouth sores  pain, tingling, numbness in the hands or feet  signs of decreased platelets or bleeding - bruising, pinpoint red spots on the skin, black, tarry stools, blood in the urine  signs of decreased red blood cells - unusually weak or tired, feeling faint or lightheaded, falls  signs of infection - fever or chills, cough, sore throat, pain or difficulty passing urine  signs and symptoms of liver injury like dark yellow or brown urine; general ill feeling or flu-like symptoms; light-colored stools; loss of appetite; nausea; right upper belly pain; unusually weak or tired; yellowing of the eyes or skin  swelling of the ankles, feet, hands  unusually slow heartbeat Side effects that usually do not require medical attention (report to your doctor or health care professional if they continue or are bothersome):  diarrhea  hair loss  loss of appetite  muscle or joint pain  nausea, vomiting  pain, redness, or irritation at site where injected  tiredness This   list may not describe all possible side effects. Call your doctor for medical advice about side effects. You may report side effects to FDA at 1-800-FDA-1088. Where should I keep my medicine? This drug is given in a hospital or clinic and will not be stored at home. NOTE: This sheet is a summary. It may not cover all possible information. If you have questions about this medicine, talk to your doctor, pharmacist, or health care provider.   2020 Elsevier/Gold Standard (2016-10-01 13:14:55)  Carboplatin injection What is this medicine? CARBOPLATIN (KAR boe pla tin) is a chemotherapy drug. It targets fast dividing cells, like cancer cells, and causes these cells to die. This medicine is used to treat ovarian cancer and many other cancers. This medicine may be used for other purposes; ask your health care provider or pharmacist if you have questions. COMMON BRAND NAME(S): Paraplatin What should I tell my health care provider before I take this medicine? They need to know if you have any of these conditions:  blood disorders  hearing problems  kidney disease  recent or ongoing radiation therapy  an unusual or allergic reaction to carboplatin, cisplatin, other chemotherapy, other medicines, foods, dyes, or preservatives  pregnant or trying to get pregnant  breast-feeding How should I use this medicine? This drug is usually given as an infusion into a vein. It is administered in a hospital or clinic by a specially trained health care professional. Talk to your pediatrician regarding the use of this medicine in children. Special care may be needed. Overdosage: If you think you have taken too much of this medicine contact a poison control center or emergency room at once. NOTE: This medicine is only for you. Do not share this medicine with others. What if I miss a dose? It is important not to miss a dose. Call your doctor or health care professional if you are unable to keep an appointment. What may interact with this medicine?  medicines for seizures  medicines to increase blood counts like filgrastim, pegfilgrastim, sargramostim  some antibiotics like amikacin, gentamicin, neomycin, streptomycin, tobramycin  vaccines Talk to your doctor or health care professional before taking any of these medicines:  acetaminophen  aspirin  ibuprofen  ketoprofen  naproxen This list may not describe all possible  interactions. Give your health care provider a list of all the medicines, herbs, non-prescription drugs, or dietary supplements you use. Also tell them if you smoke, drink alcohol, or use illegal drugs. Some items may interact with your medicine. What should I watch for while using this medicine? Your condition will be monitored carefully while you are receiving this medicine. You will need important blood work done while you are taking this medicine. This drug may make you feel generally unwell. This is not uncommon, as chemotherapy can affect healthy cells as well as cancer cells. Report any side effects. Continue your course of treatment even though you feel ill unless your doctor tells you to stop. In some cases, you may be given additional medicines to help with side effects. Follow all directions for their use. Call your doctor or health care professional for advice if you get a fever, chills or sore throat, or other symptoms of a cold or flu. Do not treat yourself. This drug decreases your body's ability to fight infections. Try to avoid being around people who are sick. This medicine may increase your risk to bruise or bleed. Call your doctor or health care professional if you notice any unusual bleeding. Be   careful brushing and flossing your teeth or using a toothpick because you may get an infection or bleed more easily. If you have any dental work done, tell your dentist you are receiving this medicine. Avoid taking products that contain aspirin, acetaminophen, ibuprofen, naproxen, or ketoprofen unless instructed by your doctor. These medicines may hide a fever. Do not become pregnant while taking this medicine. Women should inform their doctor if they wish to become pregnant or think they might be pregnant. There is a potential for serious side effects to an unborn child. Talk to your health care professional or pharmacist for more information. Do not breast-feed an infant while taking this  medicine. What side effects may I notice from receiving this medicine? Side effects that you should report to your doctor or health care professional as soon as possible:  allergic reactions like skin rash, itching or hives, swelling of the face, lips, or tongue  signs of infection - fever or chills, cough, sore throat, pain or difficulty passing urine  signs of decreased platelets or bleeding - bruising, pinpoint red spots on the skin, black, tarry stools, nosebleeds  signs of decreased red blood cells - unusually weak or tired, fainting spells, lightheadedness  breathing problems  changes in hearing  changes in vision  chest pain  high blood pressure  low blood counts - This drug may decrease the number of white blood cells, red blood cells and platelets. You may be at increased risk for infections and bleeding.  nausea and vomiting  pain, swelling, redness or irritation at the injection site  pain, tingling, numbness in the hands or feet  problems with balance, talking, walking  trouble passing urine or change in the amount of urine Side effects that usually do not require medical attention (report to your doctor or health care professional if they continue or are bothersome):  hair loss  loss of appetite  metallic taste in the mouth or changes in taste This list may not describe all possible side effects. Call your doctor for medical advice about side effects. You may report side effects to FDA at 1-800-FDA-1088. Where should I keep my medicine? This drug is given in a hospital or clinic and will not be stored at home. NOTE: This sheet is a summary. It may not cover all possible information. If you have questions about this medicine, talk to your doctor, pharmacist, or health care provider.  2020 Elsevier/Gold Standard (2007-05-05 14:38:05)   

## 2019-05-31 ENCOUNTER — Telehealth: Payer: Self-pay | Admitting: Oncology

## 2019-05-31 ENCOUNTER — Telehealth: Payer: Self-pay | Admitting: Emergency Medicine

## 2019-05-31 NOTE — Telephone Encounter (Signed)
-----   Message from Arty Baumgartner, RN sent at 05/28/2019  2:59 PM EDT ----- Regarding: [place] taxol/carbo.  Tolerated well.  Dr Alvy Bimler.

## 2019-05-31 NOTE — Telephone Encounter (Signed)
Chemo f/u call, spoke with patient.  Pt reports only moderately sore throat w/no other URI or other symptoms.  Pt advised to continue pushing fluids, monitor for changes in temperature or other symptoms like drainage/sneezing/chills, and to try gargling salt water.  Pt advised to call back if she doesn't see improvement in next 1-2 days, verbalized understanding of instructions.

## 2019-05-31 NOTE — Telephone Encounter (Signed)
Called Sheena Torres to see if she has applied for disability/Medicaid.  She said they are going to work on it this week.  Gave her the phone number for the [facility] for assistance.

## 2019-06-02 ENCOUNTER — Telehealth: Payer: Self-pay | Admitting: Oncology

## 2019-06-02 ENCOUNTER — Other Ambulatory Visit: Payer: Self-pay | Admitting: Hematology and Oncology

## 2019-06-02 MED ORDER — OXYCODONE-ACETAMINOPHEN 5-325 MG PO TABS: 2.0000 | ORAL_TABLET | Freq: Four times a day (QID) | ORAL | 0 refills | Status: DC | PRN

## 2019-06-02 MED FILL — TAMSULOSIN HCL 0.4 MG CAP: 0.4 | 30 days supply | Qty: 30 | Fill #0

## 2019-06-02 MED FILL — OXYCODONE-APAP 5-325MG: 5-325 | 7 days supply | Qty: 60 | Fill #0

## 2019-06-02 NOTE — Telephone Encounter (Addendum)
Sheena Torres called and requested a refill of oxycodone-acetaminophen to be sent to [place].  She has been taking it every 8 hours and has 2 left.  She also mentioned that she is having a lot of pain with her stents and has an appointment at [facility] today to have them evaluated.

## 2019-06-02 NOTE — Telephone Encounter (Signed)
Called Jonni back and let her know the refill has been sent in and advised her that she can take up to 2 tablets every 6 hours as needed.

## 2019-06-02 NOTE — Telephone Encounter (Signed)
I sent refill to [facility] and she can now take up to tabs as needed every 6 hours

## 2019-06-07 MED FILL — OXYBUTYNIN CHLORIDE 5 MG TA: 5 | 10 days supply | Qty: 30 | Fill #1

## 2019-06-08 ENCOUNTER — Telehealth: Payer: Self-pay | Admitting: Oncology

## 2019-06-08 NOTE — Telephone Encounter (Signed)
Sheena Torres called and wanted to let us know that she has been having swelling that comes and goes in her right lower abdomen and down her right leg.  She has been trying to keep her leg elevated when she can.    She also said her ability to empty her bladder comes and goes as well.  She saw the NP at [facility] Wednesday and Friday last week and was put on Flomax.  She said her bladder residual on Wednesday was 320 ml and Friday it was 13 ml.  She has days when she feels like she is emptying her bladder and other days when it doesn't.

## 2019-06-11 ENCOUNTER — Other Ambulatory Visit: Payer: Self-pay | Admitting: Hematology and Oncology

## 2019-06-11 ENCOUNTER — Telehealth: Payer: Self-pay | Admitting: Oncology

## 2019-06-11 MED ORDER — OXYCODONE-ACETAMINOPHEN 5-325 MG PO TABS: 2.0000 | ORAL_TABLET | Freq: Four times a day (QID) | ORAL | 0 refills | Status: DC | PRN

## 2019-06-11 NOTE — Telephone Encounter (Signed)
Before I sent it there, FYI, I am not sure what would be the cost and whether they have it Does she still want me to send it there?

## 2019-06-11 NOTE — Telephone Encounter (Signed)
Let Ashyra know that the refill has been sent in.

## 2019-06-11 NOTE — Telephone Encounter (Signed)
Called [facility] and the cost would be $30.00 for 60 tablets.  Called Yumiko back and advised her of the cost.  She said her son is going to help her with it and is wondering if the refill can be sent to [facility] this time because she does not have a ride to [place].

## 2019-06-11 NOTE — Telephone Encounter (Signed)
Sheena Torres called and asked for a refill of oxcodone-acetaminophen.  She is taking 1 tablet every 6 hours and will not be out until Sunday night.  She is wondering if it can be sent to [facility] in [place] which is closer to her house.  She also mentioned that the swelling in her right side is better but still comes and goes.

## 2019-06-11 NOTE — Telephone Encounter (Signed)
Refill sent.

## 2019-06-12 DIAGNOSIS — D6481 Anemia due to antineoplastic chemotherapy: Secondary | ICD-10-CM | POA: Insufficient documentation

## 2019-06-12 DIAGNOSIS — C530 Malignant neoplasm of endocervix: Secondary | ICD-10-CM | POA: Insufficient documentation

## 2019-06-12 DIAGNOSIS — R600 Localized edema: Secondary | ICD-10-CM | POA: Insufficient documentation

## 2019-06-12 DIAGNOSIS — G620 Drug-induced polyneuropathy: Secondary | ICD-10-CM | POA: Insufficient documentation

## 2019-06-12 DIAGNOSIS — Z5111 Encounter for antineoplastic chemotherapy: Secondary | ICD-10-CM | POA: Insufficient documentation

## 2019-06-12 DIAGNOSIS — G893 Neoplasm related pain (acute) (chronic): Secondary | ICD-10-CM | POA: Insufficient documentation

## 2019-06-12 DIAGNOSIS — C778 Secondary and unspecified malignant neoplasm of lymph nodes of multiple regions: Secondary | ICD-10-CM | POA: Insufficient documentation

## 2019-06-12 DIAGNOSIS — K1231 Oral mucositis (ulcerative) due to antineoplastic therapy: Secondary | ICD-10-CM | POA: Insufficient documentation

## 2019-06-14 NOTE — Progress Notes (Signed)
Pharmacist Chemotherapy Monitoring - Follow Up Assessment    I verify that I have reviewed each item in the below checklist:  . Regimen for the patient is scheduled for the appropriate day and plan matches scheduled date. Marland Kitchen Appropriate non-routine labs are ordered dependent on drug ordered. . If applicable, additional medications reviewed and ordered per protocol based on lifetime cumulative doses and/or treatment regimen.   Plan for follow-up and/or issues identified: No . I-vent associated with next due treatment: No . MD and/or nursing notified: No  Sheena Torres 06/14/2019 10:48 AM

## 2019-06-16 ENCOUNTER — Telehealth: Payer: Self-pay | Admitting: Oncology

## 2019-06-16 MED FILL — OXYBUTYNIN CHLORIDE 5 MG TA: 5 | 30 days supply | Qty: 90 | Fill #0

## 2019-06-16 NOTE — Telephone Encounter (Signed)
Sheena Torres called and is in the process of applying for disability and Medicaid.  She requested a copy of her medical records to send with her application.

## 2019-06-17 ENCOUNTER — Telehealth: Payer: Self-pay | Admitting: Oncology

## 2019-06-17 NOTE — Telephone Encounter (Signed)
Called Glenard Haring with [facility] regarding PD-L1.  She said it was sent out on 06/07/19 and should be back tomorrow or Monday.  She will fax it to [place] as soon as they receive it.

## 2019-06-18 ENCOUNTER — Inpatient Hospital Stay: Payer: Medicaid Other

## 2019-06-18 ENCOUNTER — Inpatient Hospital Stay (HOSPITAL_BASED_OUTPATIENT_CLINIC_OR_DEPARTMENT_OTHER): Payer: Medicaid Other | Admitting: Hematology and Oncology

## 2019-06-18 ENCOUNTER — Other Ambulatory Visit: Payer: Self-pay

## 2019-06-18 ENCOUNTER — Encounter: Payer: Self-pay | Admitting: Hematology and Oncology

## 2019-06-18 ENCOUNTER — Inpatient Hospital Stay: Payer: Medicaid Other | Attending: Hematology and Oncology

## 2019-06-18 VITALS — BP 108/68 | HR 90 | Temp 98.2°F | Resp 18 | Ht 62.0 in | Wt 151.0 lb

## 2019-06-18 DIAGNOSIS — R600 Localized edema: Secondary | ICD-10-CM | POA: Diagnosis not present

## 2019-06-18 DIAGNOSIS — C778 Secondary and unspecified malignant neoplasm of lymph nodes of multiple regions: Secondary | ICD-10-CM | POA: Diagnosis not present

## 2019-06-18 DIAGNOSIS — C530 Malignant neoplasm of endocervix: Secondary | ICD-10-CM | POA: Diagnosis present

## 2019-06-18 DIAGNOSIS — Z5111 Encounter for antineoplastic chemotherapy: Secondary | ICD-10-CM | POA: Diagnosis present

## 2019-06-18 DIAGNOSIS — G893 Neoplasm related pain (acute) (chronic): Secondary | ICD-10-CM

## 2019-06-18 DIAGNOSIS — T451X5A Adverse effect of antineoplastic and immunosuppressive drugs, initial encounter: Secondary | ICD-10-CM

## 2019-06-18 DIAGNOSIS — D6481 Anemia due to antineoplastic chemotherapy: Secondary | ICD-10-CM

## 2019-06-18 DIAGNOSIS — G620 Drug-induced polyneuropathy: Secondary | ICD-10-CM | POA: Diagnosis not present

## 2019-06-18 DIAGNOSIS — K1231 Oral mucositis (ulcerative) due to antineoplastic therapy: Secondary | ICD-10-CM | POA: Diagnosis not present

## 2019-06-18 LAB — CBC WITH DIFFERENTIAL (CANCER CENTER ONLY)
Abs Immature Granulocytes: 0.03 10*3/uL (ref 0.00–0.07)
Basophils Absolute: 0 10*3/uL (ref 0.0–0.1)
Basophils Relative: 0 %
Eosinophils Absolute: 0 10*3/uL (ref 0.0–0.5)
Eosinophils Relative: 0 %
HCT: 29.5 % — ABNORMAL LOW (ref 36.0–46.0)
Hemoglobin: 9.4 g/dL — ABNORMAL LOW (ref 12.0–15.0)
Immature Granulocytes: 1 %
Lymphocytes Relative: 8 %
Lymphs Abs: 0.3 10*3/uL — ABNORMAL LOW (ref 0.7–4.0)
MCH: 31 pg (ref 26.0–34.0)
MCHC: 31.9 g/dL (ref 30.0–36.0)
MCV: 97.4 fL (ref 80.0–100.0)
Monocytes Absolute: 0.1 10*3/uL (ref 0.1–1.0)
Monocytes Relative: 1 %
Neutro Abs: 3.6 10*3/uL (ref 1.7–7.7)
Neutrophils Relative %: 90 %
Platelet Count: 167 10*3/uL (ref 150–400)
RBC: 3.03 MIL/uL — ABNORMAL LOW (ref 3.87–5.11)
RDW: 13 % (ref 11.5–15.5)
WBC Count: 4 10*3/uL (ref 4.0–10.5)
nRBC: 0 % (ref 0.0–0.2)

## 2019-06-18 LAB — CMP (CANCER CENTER ONLY)
ALT: 15 U/L (ref 0–44)
AST: 16 U/L (ref 15–41)
Albumin: 3.3 g/dL — ABNORMAL LOW (ref 3.5–5.0)
Alkaline Phosphatase: 82 U/L (ref 38–126)
Anion gap: 14 (ref 5–15)
BUN: 11 mg/dL (ref 6–20)
CO2: 23 mmol/L (ref 22–32)
Calcium: 9.4 mg/dL (ref 8.9–10.3)
Chloride: 104 mmol/L (ref 98–111)
Creatinine: 1.18 mg/dL — ABNORMAL HIGH (ref 0.44–1.00)
GFR, Est AFR Am: >60 mL/min (ref >60)
GFR, Estimated: 52 mL/min — ABNORMAL LOW (ref >60)
Glucose, Bld: 153 mg/dL — ABNORMAL HIGH (ref 70–99)
Potassium: 4.4 mmol/L (ref 3.5–5.1)
Sodium: 141 mmol/L (ref 135–145)
Total Bilirubin: <0.2 mg/dL — ABNORMAL LOW (ref 0.3–1.2)
Total Protein: 6.7 g/dL (ref 6.5–8.1)

## 2019-06-18 MED ORDER — SODIUM CHLORIDE 0.9 % IV SOLN
150.0000 mg | Freq: Once | INTRAVENOUS | Status: AC
  Administered 2019-06-18: 150 mg via INTRAVENOUS
  Filled 2019-06-18: qty 150

## 2019-06-18 MED ORDER — PALONOSETRON HCL INJECTION 0.25 MG/5ML
0.2500 mg | Freq: Once | INTRAVENOUS | Status: AC
  Administered 2019-06-18: 09:00:00 0.25 mg via INTRAVENOUS

## 2019-06-18 MED ORDER — DIPHENHYDRAMINE HCL 50 MG/ML IJ SOLN
50.0000 mg | Freq: Once | INTRAMUSCULAR | Status: AC
  Administered 2019-06-18: 50 mg via INTRAVENOUS

## 2019-06-18 MED ORDER — SODIUM CHLORIDE 0.9 % IV SOLN
Freq: Once | INTRAVENOUS | Status: AC
  Filled 2019-06-18: qty 250

## 2019-06-18 MED ORDER — SODIUM CHLORIDE 0.9 % IV SOLN
414.5000 mg | Freq: Once | INTRAVENOUS | Status: AC
  Administered 2019-06-18: 410 mg via INTRAVENOUS
  Filled 2019-06-18: qty 41

## 2019-06-18 MED ORDER — PALONOSETRON HCL INJECTION 0.25 MG/5ML
INTRAVENOUS | Status: AC
  Filled 2019-06-18: qty 5

## 2019-06-18 MED ORDER — DIPHENHYDRAMINE HCL 50 MG/ML IJ SOLN
INTRAMUSCULAR | Status: AC
  Filled 2019-06-18: qty 1

## 2019-06-18 MED ORDER — SODIUM CHLORIDE 0.9 % IV SOLN
175.0000 mg/m2 | Freq: Once | INTRAVENOUS | Status: AC
  Administered 2019-06-18: 300 mg via INTRAVENOUS
  Filled 2019-06-18: qty 50

## 2019-06-18 MED ORDER — FAMOTIDINE IN NACL 20-0.9 MG/50ML-% IV SOLN
20.0000 mg | Freq: Once | INTRAVENOUS | Status: AC
  Administered 2019-06-18: 09:00:00 20 mg via INTRAVENOUS

## 2019-06-18 MED ORDER — SODIUM CHLORIDE 0.9 % IV SOLN
10.0000 mg | Freq: Once | INTRAVENOUS | Status: AC
  Administered 2019-06-18: 10 mg via INTRAVENOUS
  Filled 2019-06-18: qty 10

## 2019-06-18 MED ORDER — METHADONE HCL 10 MG PO TABS: 10.0000 mg | ORAL_TABLET | Freq: Two times a day (BID) | ORAL | 0 refills | Status: DC

## 2019-06-18 MED ORDER — FAMOTIDINE IN NACL 20-0.9 MG/50ML-% IV SOLN
INTRAVENOUS | Status: AC
  Filled 2019-06-18: qty 50

## 2019-06-18 MED FILL — METHADONE HCL 10 MG TABLET: 10 | 15 days supply | Qty: 30 | Fill #0

## 2019-06-18 NOTE — Assessment & Plan Note (Signed)

## 2019-06-18 NOTE — Assessment & Plan Note (Signed)
So far, she tolerated treatment fairly well with expected side effects such as mucositis, mild anemia, persistent cancer associated pain and mild neuropathy I recommend minimum 3 cycles of treatment before repeat imaging study I plan to hold off adding bevacizumab until after her next imaging study is available In the meantime, we will continue aggressive supportive care

## 2019-06-18 NOTE — Assessment & Plan Note (Signed)
She has suboptimal pain control I recommend introduction of long-acting pain medicine in the form of methadone We discussed the risk and benefits, along with side effects of methadone including risk of nausea, constipation and sedation and she is willing to proceed She will start taking methadone later this evening, along with her oxycodone/acetaminophen for breakthrough pain medicine Once her breakthrough pain medicine runs out, I plan to switch her over to oxycodone as needed for breakthrough medicine We discussed narcotic refill policy

## 2019-06-18 NOTE — Progress Notes (Signed)
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[facility]) So far, she tolerated treatment fairly well with expected side effects such as mucositis, mild anemia, persistent cancer associated pain and mild neuropathy I recommend minimum 3 cycles of treatment before repeat imaging study I plan to hold off adding bevacizumab until after her next imaging study is available In the meantime, we will continue aggressive supportive care  Cancer associated pain She has suboptimal pain control I recommend introduction of long-acting pain medicine in the form of methadone We discussed the risk and benefits, along with side effects of methadone including risk of nausea, constipation and sedation and she is willing to proceed She will start taking methadone later this evening, along with her oxycodone/acetaminophen for breakthrough pain medicine Once her breakthrough pain medicine runs out, I plan to switch her over to oxycodone as needed for breakthrough medicine We discussed narcotic refill policy  Peripheral neuropathy due to chemotherapy [facility]) She denies worsening peripheral neuropathy We will continue similar dose as before  Edema of right lower extremity This is due to compression of peripheral flow secondary to her pelvic disease I recommend her to use elastic compression hose  Anemia due to antineoplastic chemotherapy This is likely due to recent treatment. The patient denies recent history of bleeding such as epistaxis, hematuria or hematochezia. She is asymptomatic from the anemia. I will observe for now.  She does not require transfusion now. I will continue the chemotherapy at current dose without dosage adjustment.  If the anemia gets progressive worse in the future, I might have to delay her treatment  or adjust the chemotherapy dose.   Mucositis due to antineoplastic therapy I recommend conservative approach with baking soda mixed with salt water gargle as needed   No orders of the defined types were placed in this encounter.   All questions were answered. The patient knows to call the clinic with any problems, questions or concerns. The total time spent in the appointment was 30 minutes encounter with patients including review of chart and various tests results, discussions about plan of care and coordination of care plan   Heath Lark, MD 06/18/2019 8:35 AM  INTERVAL HISTORY: Please see below for problem oriented charting. She returns with her son for cycle 2 of treatment She have suboptimal pain control She is using 2 Percocet 3-4 times a day and felt that it does not last long enough She denies nausea or constipation She had very mild peripheral neuropathy from previous treatment The patient denies any recent signs or symptoms of bleeding such as spontaneous epistaxis, hematuria or hematochezia. Her appetite is fair.  She have not lost any weight She noticed right lower extremity edema that comes and goes She has good urination once she was placed on tamsulosin She also complained of very mild mucositis around day 7 today 10 of treatment but that has since resolved  SUMMARY OF ONCOLOGIC HISTORY: Oncology History  Malignant neoplasm of endocervix ([place])  09/15/2017 Initial Diagnosis   The patient reports postcoital bleeding since August, 2019 and postmenopausal bleeding since November, 2019. 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    04/16/2018 Surgery   She was taken to the OR on 04/16/18 for a cervical  cone biopsy. The specimen revealed a grossly normal cervix on the ectocervix.  Cone specimen revealed squamous cell carcinoma.  When discussing this with the pathologist he reported that in the  actual cone specimen itself that was CIN 3 however there was a separate fragment of tissue that included carcinoma.  The dimensions of this was 0.6 cm, however this involved the margins.  A post cone ECC was positive for squamous cell carcinoma, as well as an endometrial curettage which also contain benign endometrial glands.    04/16/2018 Pathology Results   Endocervix curettage: [facility]   04/22/2018 Initial Diagnosis   Malignant neoplasm of endocervix ([place])   04/24/2018 PET scan   1. There is a large mass involving the cervix and endometrium which has a maximum dimension of 5.3 cm within SUV max of 20.4. 2. Small bilateral pelvic sidewall lymph nodes measuring less than 1cm with mild nonspecific uptake. The no hypermetabolic adenopathy or evidence of distant metastatic disease. 3. Nonspecific pulmonary nodules measuring less than 5 mm are identified in the right lung. Too small to characterize by PET-CT.    04/27/2018 Imaging   MRI pelvis  1. Uterine cervix 5.5 x 3.4 x 3.2 cm mass compatible with primary cervical malignancy, with mild parametrial invasion. Stage IIB by MRI. 2. Small volume simple free fluid in the pelvic cul-de-sac. 3. No pathologically enlarged pelvic lymph nodes, see comments.   05/01/2018 Cancer Staging   Staging form: Cervix Uteri, AJCC 8th Edition - Clinical: FIGO Stage IIB (cT2b, cN0, cM0) - Signed by Heath Lark, MD on 05/01/2018   05/08/2018 Procedure   Placement of a subcutaneous port device. Catheter tip at the superior cavoatrial junction.   05/14/2018 - 07/27/2018 Radiation Therapy   Radiation treatment dates:    1. 05/14/2018 - 06/24/2018 2. 5/21, 5/28, 6/1, 6/9, 07/27/2018   Site/dose:    1.         A. pelvsi / 45 Gy in 25 fractions              B. Parametrial  boost / 9 Gy in 5 fractions 2. Cervix, Tandem and Ring System / 27.5 Gy in 5 fractions     05/15/2018 - 06/26/2018 Chemotherapy   The patient had weekly cisplatin   10/27/2018 PET scan   Near complete  resolution of FDG uptake at site of previously seen cervical mass. Resolution of previously seen sub-cm pelvic lymph nodes. No evidence of metabolically-active metastatic disease.   05/13/2019 Imaging   1. There is ill-defined, hypoenhancing soft tissue most clearly visualized at the left aspect of the uterus and adnexa, which obstructs the distal left ureter, measuring approximately 4.0 x 3.4 cm. This is concerning for local recurrence of cervical malignancy.   2. Ill-defined, hypoenhancing appearance of the cervix with loss of distinction of the fat plane to the posterior urinary bladder, in keeping with primary malignancy.   3. Interval enlargement of numerous abnormal retroperitoneal and bilateral inguinal lymph nodes, consistent with nodal metastatic disease.    4. There is new moderate left, mild right hydronephrosis and hydroureter, with soft tissue described above obstructing the distal left ureter in the vicinity of the adnexa. There is high-grade obstruction of the distal left ureter with no excreted contrast on delayed phase imaging. Point of obstruction of the right ureter is not clearly visualized, possibly at the ureterovesicular junction.       REVIEW OF SYSTEMS:   Constitutional: Denies fevers, chills or abnormal weight loss Eyes: Denies blurriness of vision  Respiratory: Denies cough, dyspnea or wheezes Cardiovascular: Denies palpitation, chest discomfort  Gastrointestinal:  Denies nausea, heartburn or change in bowel habits Skin: Denies abnormal skin rashes Lymphatics: Denies new lymphadenopathy or easy bruising Behavioral/Psych: Mood is stable, no new changes  All other systems were reviewed with the patient and are negative.  I have reviewed the past medical history, past surgical history, social history and family history with the patient and they are unchanged from previous note.  ALLERGIES:  has No Known Allergies.  MEDICATIONS:  Current Outpatient Medications   Medication Sig Dispense Refill  . tamsulosin (FLOMAX) 0.4 MG CAPS capsule Take 0.4 mg by mouth 2 (two) times daily.    Marland Kitchen acetaminophen (TYLENOL) 500 MG tablet Take 1,000 mg by mouth daily as needed for mild pain or moderate pain.     Marland Kitchen dexamethasone (DECADRON) 4 MG tablet Take 1 tablet (4 mg total) by mouth 2 (two) times daily. 36 tablet 0  . methadone (DOLOPHINE) 10 MG tablet Take 1 tablet (10 mg total) by mouth every 12 (twelve) hours. 30 tablet 0  . ondansetron (ZOFRAN) 8 MG tablet Take 1 tablet (8 mg total) by mouth 2 (two) times daily as needed for refractory nausea / vomiting. Start on day 3 after carboplatin chemo. 30 tablet 1  . oxybutynin (DITROPAN) 5 MG tablet Take 1 tablet (5 mg total) by mouth every 8 (eight) hours as needed for bladder spasms. 30 tablet 1  . oxyCODONE-acetaminophen (PERCOCET/ROXICET) 5-325 MG tablet Take 2 tablets by mouth every 6 (six) hours as needed for severe pain. 60 tablet 0  . prochlorperazine (COMPAZINE) 10 MG tablet Take 1 tablet (10 mg total) by mouth every 6 (six) hours as needed (Nausea or vomiting). 30 tablet 1   No current facility-administered medications for this visit.    PHYSICAL EXAMINATION: ECOG PERFORMANCE STATUS: 1 - Symptomatic but completely ambulatory  Vitals:   06/18/19 0800  BP: 108/68  Pulse: 90  Resp: 18  Temp: 98.2 F (36.8 C)  SpO2: 95%   Filed Weights   06/18/19 0800  Weight: 151 lb (68.5 kg)    GENERAL:alert, no distress and comfortable SKIN: skin color, texture, turgor are normal, no rashes or significant lesions EYES: normal, Conjunctiva are pink and non-injected, sclera clear OROPHARYNX:no exudate, no erythema and lips, buccal mucosa, and tongue normal  NECK: supple, thyroid normal size, non-tender, without nodularity LYMPH:  no palpable lymphadenopathy in the cervical, axillary or inguinal LUNGS: clear to auscultation and percussion with normal breathing effort HEART: regular rate & rhythm and no murmurs with  mild right lower extremity edema ABDOMEN:abdomen soft, non-tender and normal bowel sounds Musculoskeletal:no cyanosis of digits and no clubbing  NEURO: alert & oriented x 3 with fluent speech, no focal motor/sensory deficits  LABORATORY DATA:  I have reviewed the data as listed    Component Value Date/Time   NA 137 05/21/2019 1505   K 3.8 05/21/2019 1505   CL 102 05/21/2019 1505   CO2 29 05/21/2019 1505   GLUCOSE 125 (H) 05/21/2019 1505   BUN 12 05/21/2019 1505   CREATININE 1.18 (H) 05/21/2019 1505   CALCIUM 9.4 05/21/2019 1505   PROT 7.1 05/21/2019 1505   ALBUMIN 3.7 05/21/2019 1505   AST 15 05/21/2019 1505   ALT 8 05/21/2019 1505   ALKPHOS 84 05/21/2019 1505   BILITOT 0.2 (L) 05/21/2019 1505   GFRNONAA 53 (L) 05/21/2019 1505   GFRAA >60 05/21/2019 1505    No results found for: SPEP, UPEP  Lab Results  Component Value Date   WBC 4.0 06/18/2019   NEUTROABS 3.6 06/18/2019   HGB 9.4 (L) 06/18/2019   HCT 29.5 (L) 06/18/2019   MCV 97.4 06/18/2019   PLT 167 06/18/2019      Chemistry      Component Value Date/Time   NA 137 05/21/2019 1505   K 3.8 05/21/2019 1505   CL 102 05/21/2019 1505   CO2 29 05/21/2019 1505   BUN 12 05/21/2019 1505   CREATININE 1.18 (H) 05/21/2019 1505      Component Value Date/Time   CALCIUM 9.4 05/21/2019 1505   ALKPHOS 84 05/21/2019 1505   AST 15 05/21/2019 1505   ALT 8 05/21/2019 1505   BILITOT 0.2 (L) 05/21/2019 1505

## 2019-06-18 NOTE — Assessment & Plan Note (Signed)
This is due to compression of peripheral flow secondary to her pelvic disease I recommend her to use elastic compression hose

## 2019-06-18 NOTE — Patient Instructions (Signed)
[facility] Discharge Instructions for Patients Receiving Chemotherapy  Today you received the following chemotherapy agents taxol/carboplatin   To help prevent nausea and vomiting after your treatment, we encourage you to take your nausea medication as directed.     If you develop nausea and vomiting that is not controlled by your nausea medication, call the clinic.   BELOW ARE SYMPTOMS THAT SHOULD BE REPORTED IMMEDIATELY:  *FEVER GREATER THAN 100.5 F  *CHILLS WITH OR WITHOUT FEVER  NAUSEA AND VOMITING THAT IS NOT CONTROLLED WITH YOUR NAUSEA MEDICATION  *UNUSUAL SHORTNESS OF BREATH  *UNUSUAL BRUISING OR BLEEDING  TENDERNESS IN MOUTH AND THROAT WITH OR WITHOUT PRESENCE OF ULCERS  *URINARY PROBLEMS  *BOWEL PROBLEMS  UNUSUAL RASH Items with * indicate a potential emergency and should be followed up as soon as possible.  Feel free to call the clinic you have any questions or concerns. The clinic phone number is [phone].  Paclitaxel injection What is this medicine? PACLITAXEL (PAK li TAX el) is a chemotherapy drug. It targets fast dividing cells, like cancer cells, and causes these cells to die. This medicine is used to treat ovarian cancer, breast cancer, lung cancer, Kaposi's sarcoma, and other cancers. This medicine may be used for other purposes; ask your health care provider or pharmacist if you have questions. COMMON BRAND NAME(S): Onxol, Taxol What should I tell my health care provider before I take this medicine? They need to know if you have any of these conditions:  history of irregular heartbeat  liver disease  low blood counts, like low white cell, platelet, or red cell counts  lung or breathing disease, like asthma  tingling of the fingers or toes, or other nerve disorder  an unusual or allergic reaction to paclitaxel, alcohol, polyoxyethylated castor oil, other chemotherapy, other medicines, foods, dyes, or preservatives  pregnant or  trying to get pregnant  breast-feeding How should I use this medicine? This drug is given as an infusion into a vein. It is administered in a hospital or clinic by a specially trained health care professional. Talk to your pediatrician regarding the use of this medicine in children. Special care may be needed. Overdosage: If you think you have taken too much of this medicine contact a poison control center or emergency room at once. NOTE: This medicine is only for you. Do not share this medicine with others. What if I miss a dose? It is important not to miss your dose. Call your doctor or health care professional if you are unable to keep an appointment. What may interact with this medicine? Do not take this medicine with any of the following medications:  disulfiram  metronidazole This medicine may also interact with the following medications:  antiviral medicines for hepatitis, HIV or AIDS  certain antibiotics like erythromycin and clarithromycin  certain medicines for fungal infections like ketoconazole and itraconazole  certain medicines for seizures like carbamazepine, phenobarbital, phenytoin  gemfibrozil  nefazodone  rifampin  St. John's wort This list may not describe all possible interactions. Give your health care provider a list of all the medicines, herbs, non-prescription drugs, or dietary supplements you use. Also tell them if you smoke, drink alcohol, or use illegal drugs. Some items may interact with your medicine. What should I watch for while using this medicine? Your condition will be monitored carefully while you are receiving this medicine. You will need important blood work done while you are taking this medicine. This medicine can cause serious allergic reactions.   To reduce your risk you will need to take other medicine(s) before treatment with this medicine. If you experience allergic reactions like skin rash, itching or hives, swelling of the face, lips,  or tongue, tell your doctor or health care professional right away. In some cases, you may be given additional medicines to help with side effects. Follow all directions for their use. This drug may make you feel generally unwell. This is not uncommon, as chemotherapy can affect healthy cells as well as cancer cells. Report any side effects. Continue your course of treatment even though you feel ill unless your doctor tells you to stop. Call your doctor or health care professional for advice if you get a fever, chills or sore throat, or other symptoms of a cold or flu. Do not treat yourself. This drug decreases your body's ability to fight infections. Try to avoid being around people who are sick. This medicine may increase your risk to bruise or bleed. Call your doctor or health care professional if you notice any unusual bleeding. Be careful brushing and flossing your teeth or using a toothpick because you may get an infection or bleed more easily. If you have any dental work done, tell your dentist you are receiving this medicine. Avoid taking products that contain aspirin, acetaminophen, ibuprofen, naproxen, or ketoprofen unless instructed by your doctor. These medicines may hide a fever. Do not become pregnant while taking this medicine. Women should inform their doctor if they wish to become pregnant or think they might be pregnant. There is a potential for serious side effects to an unborn child. Talk to your health care professional or pharmacist for more information. Do not breast-feed an infant while taking this medicine. Men are advised not to father a child while receiving this medicine. This product may contain alcohol. Ask your pharmacist or healthcare provider if this medicine contains alcohol. Be sure to tell all healthcare providers you are taking this medicine. Certain medicines, like metronidazole and disulfiram, can cause an unpleasant reaction when taken with alcohol. The reaction  includes flushing, headache, nausea, vomiting, sweating, and increased thirst. The reaction can last from 30 minutes to several hours. What side effects may I notice from receiving this medicine? Side effects that you should report to your doctor or health care professional as soon as possible:  allergic reactions like skin rash, itching or hives, swelling of the face, lips, or tongue  breathing problems  changes in vision  fast, irregular heartbeat  high or low blood pressure  mouth sores  pain, tingling, numbness in the hands or feet  signs of decreased platelets or bleeding - bruising, pinpoint red spots on the skin, black, tarry stools, blood in the urine  signs of decreased red blood cells - unusually weak or tired, feeling faint or lightheaded, falls  signs of infection - fever or chills, cough, sore throat, pain or difficulty passing urine  signs and symptoms of liver injury like dark yellow or brown urine; general ill feeling or flu-like symptoms; light-colored stools; loss of appetite; nausea; right upper belly pain; unusually weak or tired; yellowing of the eyes or skin  swelling of the ankles, feet, hands  unusually slow heartbeat Side effects that usually do not require medical attention (report to your doctor or health care professional if they continue or are bothersome):  diarrhea  hair loss  loss of appetite  muscle or joint pain  nausea, vomiting  pain, redness, or irritation at site where injected  tiredness This   list may not describe all possible side effects. Call your doctor for medical advice about side effects. You may report side effects to FDA at 1-800-FDA-1088. Where should I keep my medicine? This drug is given in a hospital or clinic and will not be stored at home. NOTE: This sheet is a summary. It may not cover all possible information. If you have questions about this medicine, talk to your doctor, pharmacist, or health care provider.   2020 Elsevier/Gold Standard (2016-10-01 13:14:55)  Carboplatin injection What is this medicine? CARBOPLATIN (KAR boe pla tin) is a chemotherapy drug. It targets fast dividing cells, like cancer cells, and causes these cells to die. This medicine is used to treat ovarian cancer and many other cancers. This medicine may be used for other purposes; ask your health care provider or pharmacist if you have questions. COMMON BRAND NAME(S): Paraplatin What should I tell my health care provider before I take this medicine? They need to know if you have any of these conditions:  blood disorders  hearing problems  kidney disease  recent or ongoing radiation therapy  an unusual or allergic reaction to carboplatin, cisplatin, other chemotherapy, other medicines, foods, dyes, or preservatives  pregnant or trying to get pregnant  breast-feeding How should I use this medicine? This drug is usually given as an infusion into a vein. It is administered in a hospital or clinic by a specially trained health care professional. Talk to your pediatrician regarding the use of this medicine in children. Special care may be needed. Overdosage: If you think you have taken too much of this medicine contact a poison control center or emergency room at once. NOTE: This medicine is only for you. Do not share this medicine with others. What if I miss a dose? It is important not to miss a dose. Call your doctor or health care professional if you are unable to keep an appointment. What may interact with this medicine?  medicines for seizures  medicines to increase blood counts like filgrastim, pegfilgrastim, sargramostim  some antibiotics like amikacin, gentamicin, neomycin, streptomycin, tobramycin  vaccines Talk to your doctor or health care professional before taking any of these medicines:  acetaminophen  aspirin  ibuprofen  ketoprofen  naproxen This list may not describe all possible  interactions. Give your health care provider a list of all the medicines, herbs, non-prescription drugs, or dietary supplements you use. Also tell them if you smoke, drink alcohol, or use illegal drugs. Some items may interact with your medicine. What should I watch for while using this medicine? Your condition will be monitored carefully while you are receiving this medicine. You will need important blood work done while you are taking this medicine. This drug may make you feel generally unwell. This is not uncommon, as chemotherapy can affect healthy cells as well as cancer cells. Report any side effects. Continue your course of treatment even though you feel ill unless your doctor tells you to stop. In some cases, you may be given additional medicines to help with side effects. Follow all directions for their use. Call your doctor or health care professional for advice if you get a fever, chills or sore throat, or other symptoms of a cold or flu. Do not treat yourself. This drug decreases your body's ability to fight infections. Try to avoid being around people who are sick. This medicine may increase your risk to bruise or bleed. Call your doctor or health care professional if you notice any unusual bleeding. Be   careful brushing and flossing your teeth or using a toothpick because you may get an infection or bleed more easily. If you have any dental work done, tell your dentist you are receiving this medicine. Avoid taking products that contain aspirin, acetaminophen, ibuprofen, naproxen, or ketoprofen unless instructed by your doctor. These medicines may hide a fever. Do not become pregnant while taking this medicine. Women should inform their doctor if they wish to become pregnant or think they might be pregnant. There is a potential for serious side effects to an unborn child. Talk to your health care professional or pharmacist for more information. Do not breast-feed an infant while taking this  medicine. What side effects may I notice from receiving this medicine? Side effects that you should report to your doctor or health care professional as soon as possible:  allergic reactions like skin rash, itching or hives, swelling of the face, lips, or tongue  signs of infection - fever or chills, cough, sore throat, pain or difficulty passing urine  signs of decreased platelets or bleeding - bruising, pinpoint red spots on the skin, black, tarry stools, nosebleeds  signs of decreased red blood cells - unusually weak or tired, fainting spells, lightheadedness  breathing problems  changes in hearing  changes in vision  chest pain  high blood pressure  low blood counts - This drug may decrease the number of white blood cells, red blood cells and platelets. You may be at increased risk for infections and bleeding.  nausea and vomiting  pain, swelling, redness or irritation at the injection site  pain, tingling, numbness in the hands or feet  problems with balance, talking, walking  trouble passing urine or change in the amount of urine Side effects that usually do not require medical attention (report to your doctor or health care professional if they continue or are bothersome):  hair loss  loss of appetite  metallic taste in the mouth or changes in taste This list may not describe all possible side effects. Call your doctor for medical advice about side effects. You may report side effects to FDA at 1-800-FDA-1088. Where should I keep my medicine? This drug is given in a hospital or clinic and will not be stored at home. NOTE: This sheet is a summary. It may not cover all possible information. If you have questions about this medicine, talk to your doctor, pharmacist, or health care provider.  2020 Elsevier/Gold Standard (2007-05-05 14:38:05)   

## 2019-06-18 NOTE — Assessment & Plan Note (Signed)
She denies worsening peripheral neuropathy We will continue similar dose as before

## 2019-06-18 NOTE — Assessment & Plan Note (Signed)
I recommend conservative approach with baking soda mixed with salt water gargle as needed

## 2019-06-21 ENCOUNTER — Telehealth: Payer: Self-pay | Admitting: Hematology and Oncology

## 2019-06-21 NOTE — Telephone Encounter (Signed)
Pt is aware of appts on 5/28 and 6/18. Per 5/7 sch msg.

## 2019-06-22 ENCOUNTER — Telehealth: Payer: Self-pay | Admitting: Hematology and Oncology

## 2019-06-22 ENCOUNTER — Telehealth: Payer: Self-pay | Admitting: Oncology

## 2019-06-22 ENCOUNTER — Other Ambulatory Visit: Payer: Self-pay | Admitting: Hematology and Oncology

## 2019-06-22 MED ORDER — OXYCODONE-ACETAMINOPHEN 5-325 MG PO TABS: 2.0000 | ORAL_TABLET | Freq: Four times a day (QID) | ORAL | 0 refills | Status: DC | PRN

## 2019-06-22 NOTE — Telephone Encounter (Signed)
done

## 2019-06-22 NOTE — Telephone Encounter (Signed)
Printed medical records for patient request Release ID: [id number] Patient will pick up records.

## 2019-06-22 NOTE — Telephone Encounter (Signed)
Called Sheena Torres and let her know that her medical records are ready for pick up at the [facility] and that she will need to have her driver's license when she picks them up.  She verbalized understanding and agreement.

## 2019-06-22 NOTE — Telephone Encounter (Signed)
Called Sheena Torres and let her know that the refill for Percocet has been sent.

## 2019-06-22 NOTE — Telephone Encounter (Signed)
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  She said the methadone makes her very groggy and sleepy.  She actually fell asleep with food in her mouth yesterday.  She does not feel safe being by herself when she takes it. She does need a refill of the Percocet sent to the [facility].  She has enough left to get through today.

## 2019-07-01 ENCOUNTER — Other Ambulatory Visit: Payer: Self-pay | Admitting: Hematology and Oncology

## 2019-07-01 ENCOUNTER — Telehealth: Payer: Self-pay | Admitting: Oncology

## 2019-07-01 MED ORDER — OXYCODONE-ACETAMINOPHEN 5-325 MG PO TABS: 2.0000 | ORAL_TABLET | Freq: Four times a day (QID) | ORAL | 0 refills | Status: DC | PRN

## 2019-07-01 MED FILL — OXYCODONE-APAP 5-325MG: 5-325 | 7 days supply | Qty: 60 | Fill #0

## 2019-07-01 NOTE — Telephone Encounter (Signed)
Refill sent.

## 2019-07-01 NOTE — Telephone Encounter (Signed)
Called Sheena Torres and let her know the refill has been sent. 

## 2019-07-01 NOTE — Telephone Encounter (Signed)
Sheena Torres called and asked for a refill of Percocet to be sent to [facility].  She said she has enough left for today.

## 2019-07-08 MED FILL — Dexamethasone Sodium Phosphate Inj 100 MG/10ML: INTRAMUSCULAR | Qty: 1 | Status: AC

## 2019-07-09 ENCOUNTER — Encounter: Payer: Self-pay | Admitting: Hematology and Oncology

## 2019-07-09 ENCOUNTER — Inpatient Hospital Stay (HOSPITAL_BASED_OUTPATIENT_CLINIC_OR_DEPARTMENT_OTHER): Payer: Medicaid Other | Admitting: Hematology and Oncology

## 2019-07-09 ENCOUNTER — Inpatient Hospital Stay: Payer: Medicaid Other

## 2019-07-09 ENCOUNTER — Other Ambulatory Visit: Payer: Self-pay

## 2019-07-09 VITALS — BP 113/69 | HR 75 | Temp 98.5°F | Resp 18 | Ht 62.0 in | Wt 147.0 lb

## 2019-07-09 DIAGNOSIS — T451X5A Adverse effect of antineoplastic and immunosuppressive drugs, initial encounter: Secondary | ICD-10-CM

## 2019-07-09 DIAGNOSIS — C530 Malignant neoplasm of endocervix: Secondary | ICD-10-CM | POA: Diagnosis not present

## 2019-07-09 DIAGNOSIS — D61818 Other pancytopenia: Secondary | ICD-10-CM | POA: Diagnosis not present

## 2019-07-09 DIAGNOSIS — G893 Neoplasm related pain (acute) (chronic): Secondary | ICD-10-CM

## 2019-07-09 DIAGNOSIS — Z5111 Encounter for antineoplastic chemotherapy: Secondary | ICD-10-CM | POA: Diagnosis not present

## 2019-07-09 DIAGNOSIS — G620 Drug-induced polyneuropathy: Secondary | ICD-10-CM | POA: Diagnosis not present

## 2019-07-09 DIAGNOSIS — N1330 Unspecified hydronephrosis: Secondary | ICD-10-CM

## 2019-07-09 LAB — CBC WITH DIFFERENTIAL (CANCER CENTER ONLY)
Abs Immature Granulocytes: 0.02 10*3/uL (ref 0.00–0.07)
Basophils Absolute: 0 10*3/uL (ref 0.0–0.1)
Basophils Relative: 0 %
Eosinophils Absolute: 0 10*3/uL (ref 0.0–0.5)
Eosinophils Relative: 0 %
HCT: 25.9 % — ABNORMAL LOW (ref 36.0–46.0)
Hemoglobin: 8.4 g/dL — ABNORMAL LOW (ref 12.0–15.0)
Immature Granulocytes: 1 %
Lymphocytes Relative: 12 %
Lymphs Abs: 0.4 10*3/uL — ABNORMAL LOW (ref 0.7–4.0)
MCH: 31.5 pg (ref 26.0–34.0)
MCHC: 32.4 g/dL (ref 30.0–36.0)
MCV: 97 fL (ref 80.0–100.0)
Monocytes Absolute: 0.1 10*3/uL (ref 0.1–1.0)
Monocytes Relative: 2 %
Neutro Abs: 2.6 10*3/uL (ref 1.7–7.7)
Neutrophils Relative %: 85 %
Platelet Count: 81 10*3/uL — ABNORMAL LOW (ref 150–400)
RBC: 2.67 MIL/uL — ABNORMAL LOW (ref 3.87–5.11)
RDW: 15.4 % (ref 11.5–15.5)
WBC Count: 3 10*3/uL — ABNORMAL LOW (ref 4.0–10.5)
nRBC: 0 % (ref 0.0–0.2)

## 2019-07-09 LAB — CMP (CANCER CENTER ONLY)
ALT: 12 U/L (ref 0–44)
AST: 14 U/L — ABNORMAL LOW (ref 15–41)
Albumin: 3.6 g/dL (ref 3.5–5.0)
Alkaline Phosphatase: 84 U/L (ref 38–126)
Anion gap: 12 (ref 5–15)
BUN: 14 mg/dL (ref 6–20)
CO2: 21 mmol/L — ABNORMAL LOW (ref 22–32)
Calcium: 9.5 mg/dL (ref 8.9–10.3)
Chloride: 107 mmol/L (ref 98–111)
Creatinine: 1.05 mg/dL — ABNORMAL HIGH (ref 0.44–1.00)
GFR, Est AFR Am: >60 mL/min (ref >60)
GFR, Estimated: >60 mL/min (ref >60)
Glucose, Bld: 170 mg/dL — ABNORMAL HIGH (ref 70–99)
Potassium: 4 mmol/L (ref 3.5–5.1)
Sodium: 140 mmol/L (ref 135–145)
Total Bilirubin: <0.2 mg/dL — ABNORMAL LOW (ref 0.3–1.2)
Total Protein: 6.7 g/dL (ref 6.5–8.1)

## 2019-07-09 MED ORDER — OXYCODONE-ACETAMINOPHEN 5-325 MG PO TABS: 2.0000 | ORAL_TABLET | Freq: Four times a day (QID) | ORAL | 0 refills | Status: DC | PRN

## 2019-07-09 MED FILL — OXYCODONE-APAP 5-325MG: 5-325 | 7 days supply | Qty: 60 | Fill #0

## 2019-07-09 NOTE — Assessment & Plan Note (Signed)
This is due to cumulative side effects of treatment I plan to reduce the dose of chemotherapy We will reschedule her treatment to allow bone marrow recovery She does not need transfusion support

## 2019-07-09 NOTE — Progress Notes (Signed)
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[facility]) Unfortunately, she has developed severe pancytopenia from treatment It is not safe for her to proceed with treatment I recommend taking a break and reschedule her chemotherapy until June 7 We discussed the importance of not losing weight while on therapy Due to peripheral neuropathy, I also plan to reduce the dose of her chemotherapy I recommend minimum 3 cycles of treatment before repeat imaging study  Pancytopenia, acquired ([place]) This is due to cumulative side effects of treatment I plan to reduce the dose of chemotherapy We will reschedule her treatment to allow bone marrow recovery She does not need transfusion support  Peripheral neuropathy due to chemotherapy [facility]) she has mild peripheral neuropathy, likely related to side effects of treatment. I plan to reduce the dose of treatment as outlined above.  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 CT ABDOMEN PELVIS W CONTRAST    Standing Status:   Future    Standing Expiration Date:   07/08/2020    Order Specific Question:   If indicated for the ordered procedure, I authorize the administration of contrast media per Radiology protocol    Answer:   Yes    Order Specific Question:   Preferred imaging location?    Answer:   [facility]    Order Specific  Question:   Radiology Contrast Protocol - do NOT remove file path    Answer:   [email or web address]    Order Specific Question:   Is patient pregnant?    Answer:   No    All questions were answered. The patient knows to call the clinic with any problems, questions or concerns. The total time spent in the appointment was 40 minutes encounter with patients including review of chart and various tests results, discussions about plan of care and coordination of care plan   Heath Lark, MD 07/09/2019 4:07 PM  INTERVAL HISTORY: Please see below for problem oriented charting. She returns for cycle 3 of treatment Her son is available over the phone She have lost some weight although she claims she is eating well Her pain is well controlled She had some peripheral neuropathy but not severe No recent nausea or constipation Denies abnormal bleeding She has good urination  SUMMARY OF ONCOLOGIC HISTORY: Oncology History Overview Note  PD-L1 5%   Malignant neoplasm of endocervix ([place])  09/15/2017 Initial Diagnosis   The patient reports postcoital bleeding since August, 2019 and postmenopausal bleeding since November, 2019. 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    04/16/2018 Surgery   She was taken to the OR on 04/16/18 for a cervical cone biopsy. The specimen revealed a grossly normal cervix on the ectocervix.  Cone specimen revealed squamous cell carcinoma.  When discussing this with the pathologist he reported that  in the actual cone specimen itself that was CIN 3 however there was a separate fragment of tissue that included carcinoma.  The dimensions of this was 0.6 cm, however this involved the margins.  A post cone ECC was positive for squamous cell carcinoma, as well as an endometrial curettage which also contain benign endometrial glands.    04/16/2018 Pathology Results   Endocervix  curettage: [facility]   04/22/2018 Initial Diagnosis   Malignant neoplasm of endocervix ([place])   04/24/2018 PET scan   1. There is a large mass involving the cervix and endometrium which has a maximum dimension of 5.3 cm within SUV max of 20.4. 2. Small bilateral pelvic sidewall lymph nodes measuring less than 1cm with mild nonspecific uptake. The no hypermetabolic adenopathy or evidence of distant metastatic disease. 3. Nonspecific pulmonary nodules measuring less than 5 mm are identified in the right lung. Too small to characterize by PET-CT.    04/27/2018 Imaging   MRI pelvis  1. Uterine cervix 5.5 x 3.4 x 3.2 cm mass compatible with primary cervical malignancy, with mild parametrial invasion. Stage IIB by MRI. 2. Small volume simple free fluid in the pelvic cul-de-sac. 3. No pathologically enlarged pelvic lymph nodes, see comments.   05/01/2018 Cancer Staging   Staging form: Cervix Uteri, AJCC 8th Edition - Clinical: FIGO Stage IIB (cT2b, cN0, cM0) - Signed by Heath Lark, MD on 05/01/2018   05/08/2018 Procedure   Placement of a subcutaneous port device. Catheter tip at the superior cavoatrial junction.   05/14/2018 - 07/27/2018 Radiation Therapy   Radiation treatment dates:    1. 05/14/2018 - 06/24/2018 2. 5/21, 5/28, 6/1, 6/9, 07/27/2018   Site/dose:    1.         A. pelvsi / 45 Gy in 25 fractions              B. Parametrial  boost / 9 Gy in 5 fractions 2. Cervix, Tandem and Ring System / 27.5 Gy in 5 fractions     05/15/2018 - 06/26/2018 Chemotherapy   The patient had weekly cisplatin   10/27/2018 PET scan   Near complete resolution of FDG uptake at site of previously seen cervical mass. Resolution of previously seen sub-cm pelvic lymph nodes. No evidence of metabolically-active metastatic disease.   05/13/2019 Imaging   1. There is ill-defined, hypoenhancing soft tissue most clearly visualized at the left aspect of the uterus and adnexa, which obstructs the distal left ureter, measuring  approximately 4.0 x 3.4 cm. This is concerning for local recurrence of cervical malignancy.   2. Ill-defined, hypoenhancing appearance of the cervix with loss of distinction of the fat plane to the posterior urinary bladder, in keeping with primary malignancy.   3. Interval enlargement of numerous abnormal retroperitoneal and bilateral inguinal lymph nodes, consistent with nodal metastatic disease.    4. There is new moderate left, mild right hydronephrosis and hydroureter, with soft tissue described above obstructing the distal left ureter in the vicinity of the adnexa. There is high-grade obstruction of the distal left ureter with no excreted contrast on delayed phase imaging. Point of obstruction of the right ureter is not clearly visualized, possibly at the ureterovesicular junction.      Genetic Testing   Patient has genetic testing done for PD-L1. Results revealed patient has the following: PD-L1 staining in tumor cells (TC): 5% PD-L1 staining in tumor associated immune cells (IC): <1% PD-L1 combined positive score (CPS): 5%     REVIEW OF SYSTEMS:  Constitutional: Denies fevers, chills  Eyes: Denies blurriness of vision Ears, nose, mouth, throat, and face: Denies mucositis or sore throat Respiratory: Denies cough, dyspnea or wheezes Cardiovascular: Denies palpitation, chest discomfort or lower extremity swelling Gastrointestinal:  Denies nausea, heartburn or change in bowel habits Skin: Denies abnormal skin rashes Lymphatics: Denies new lymphadenopathy or easy bruising Behavioral/Psych: Mood is stable, no new changes  All other systems were reviewed with the patient and are negative.  I have reviewed the past medical history, past surgical history, social history and family history with the patient and they are unchanged from previous note.  ALLERGIES:  has No Known Allergies.  MEDICATIONS:  Current Outpatient Medications  Medication Sig Dispense Refill  . acetaminophen  (TYLENOL) 500 MG tablet Take 1,000 mg by mouth daily as needed for mild pain or moderate pain.     Marland Kitchen dexamethasone (DECADRON) 4 MG tablet Take 1 tablet (4 mg total) by mouth 2 (two) times daily. 36 tablet 0  . methadone (DOLOPHINE) 10 MG tablet Take 1 tablet (10 mg total) by mouth every 12 (twelve) hours. 30 tablet 0  . ondansetron (ZOFRAN) 8 MG tablet Take 1 tablet (8 mg total) by mouth 2 (two) times daily as needed for refractory nausea / vomiting. Start on day 3 after carboplatin chemo. 30 tablet 1  . oxybutynin (DITROPAN) 5 MG tablet Take 1 tablet (5 mg total) by mouth every 8 (eight) hours as needed for bladder spasms. 30 tablet 1  . oxyCODONE-acetaminophen (PERCOCET/ROXICET) 5-325 MG tablet Take 2 tablets by mouth every 6 (six) hours as needed for severe pain. 60 tablet 0  . prochlorperazine (COMPAZINE) 10 MG tablet Take 1 tablet (10 mg total) by mouth every 6 (six) hours as needed (Nausea or vomiting). 30 tablet 1  . tamsulosin (FLOMAX) 0.4 MG CAPS capsule Take 0.4 mg by mouth 2 (two) times daily.     No current facility-administered medications for this visit.    PHYSICAL EXAMINATION: ECOG PERFORMANCE STATUS: 1 - Symptomatic but completely ambulatory  Vitals:   07/09/19 0813  BP: 113/69  Pulse: 75  Resp: 18  Temp: 98.5 F (36.9 C)  SpO2: 98%   Filed Weights   07/09/19 0813  Weight: 147 lb (66.7 kg)    GENERAL:alert, no distress and comfortable.  She looks pale NEURO: alert & oriented x 3 with fluent speech, no focal motor/sensory deficits  LABORATORY DATA:  I have reviewed the data as listed    Component Value Date/Time   NA 140 07/09/2019 0751   K 4.0 07/09/2019 0751   CL 107 07/09/2019 0751   CO2 21 (L) 07/09/2019 0751   GLUCOSE 170 (H) 07/09/2019 0751   BUN 14 07/09/2019 0751   CREATININE 1.05 (H) 07/09/2019 0751   CALCIUM 9.5 07/09/2019 0751   PROT 6.7 07/09/2019 0751   ALBUMIN 3.6 07/09/2019 0751   AST 14 (L) 07/09/2019 0751   ALT 12 07/09/2019 0751    ALKPHOS 84 07/09/2019 0751   BILITOT <0.2 (L) 07/09/2019 0751   GFRNONAA >60 07/09/2019 0751   GFRAA >60 07/09/2019 0751    No results found for: SPEP, UPEP  Lab Results  Component Value Date   WBC 3.0 (L) 07/09/2019   NEUTROABS 2.6 07/09/2019   HGB 8.4 (L) 07/09/2019   HCT 25.9 (L) 07/09/2019   MCV 97.0 07/09/2019   PLT 81 (L) 07/09/2019      Chemistry      Component Value Date/Time   NA 140 07/09/2019 0751  K 4.0 07/09/2019 0751   CL 107 07/09/2019 0751   CO2 21 (L) 07/09/2019 0751   BUN 14 07/09/2019 0751   CREATININE 1.05 (H) 07/09/2019 0751      Component Value Date/Time   CALCIUM 9.5 07/09/2019 0751   ALKPHOS 84 07/09/2019 0751   AST 14 (L) 07/09/2019 0751   ALT 12 07/09/2019 0751   BILITOT <0.2 (L) 07/09/2019 9244

## 2019-07-09 NOTE — Assessment & Plan Note (Signed)
Unfortunately, she has developed severe pancytopenia from treatment It is not safe for her to proceed with treatment I recommend taking a break and reschedule her chemotherapy until June 7 We discussed the importance of not losing weight while on therapy Due to peripheral neuropathy, I also plan to reduce the dose of her chemotherapy I recommend minimum 3 cycles of treatment before repeat imaging study

## 2019-07-09 NOTE — Assessment & Plan Note (Signed)
She tolerated methadone poorly She would rather stay on Percocet for now I reminded the patient that she cannot exceed the recommended dose of acetaminophen at 1000 mg every 6 hours 

## 2019-07-09 NOTE — Assessment & Plan Note (Signed)
she has mild peripheral neuropathy, likely related to side effects of treatment. °I plan to reduce the dose of treatment as outlined above.  °I explained to the patient the rationale of this strategy and reassured the patient it would not compromise the efficacy of treatment ° °

## 2019-07-09 NOTE — Assessment & Plan Note (Signed)
Her renal function is stable We will monitor closely 

## 2019-07-14 ENCOUNTER — Telehealth: Payer: Self-pay | Admitting: Hematology and Oncology

## 2019-07-14 NOTE — Telephone Encounter (Signed)
Scheduled appt per 5/28 sch message - pt aware of appt date and time

## 2019-07-16 MED FILL — OXYBUTYNIN CHLORIDE 5 MG TA: 5 | 30 days supply | Qty: 90 | Fill #0

## 2019-07-19 ENCOUNTER — Inpatient Hospital Stay (HOSPITAL_BASED_OUTPATIENT_CLINIC_OR_DEPARTMENT_OTHER): Payer: Medicaid Other | Admitting: Hematology and Oncology

## 2019-07-19 ENCOUNTER — Other Ambulatory Visit: Payer: Self-pay

## 2019-07-19 ENCOUNTER — Inpatient Hospital Stay: Payer: Medicaid Other

## 2019-07-19 ENCOUNTER — Other Ambulatory Visit: Payer: Self-pay | Admitting: Hematology and Oncology

## 2019-07-19 ENCOUNTER — Inpatient Hospital Stay: Payer: Medicaid Other | Attending: Hematology and Oncology

## 2019-07-19 DIAGNOSIS — D6481 Anemia due to antineoplastic chemotherapy: Secondary | ICD-10-CM | POA: Diagnosis not present

## 2019-07-19 DIAGNOSIS — G893 Neoplasm related pain (acute) (chronic): Secondary | ICD-10-CM | POA: Diagnosis not present

## 2019-07-19 DIAGNOSIS — D61818 Other pancytopenia: Secondary | ICD-10-CM | POA: Insufficient documentation

## 2019-07-19 DIAGNOSIS — C530 Malignant neoplasm of endocervix: Secondary | ICD-10-CM

## 2019-07-19 DIAGNOSIS — T451X5A Adverse effect of antineoplastic and immunosuppressive drugs, initial encounter: Secondary | ICD-10-CM

## 2019-07-19 DIAGNOSIS — N1330 Unspecified hydronephrosis: Secondary | ICD-10-CM | POA: Diagnosis not present

## 2019-07-19 DIAGNOSIS — Z5111 Encounter for antineoplastic chemotherapy: Secondary | ICD-10-CM | POA: Diagnosis present

## 2019-07-19 LAB — CMP (CANCER CENTER ONLY)
ALT: 13 U/L (ref 0–44)
AST: 15 U/L (ref 15–41)
Albumin: 3.5 g/dL (ref 3.5–5.0)
Alkaline Phosphatase: 75 U/L (ref 38–126)
Anion gap: 10 (ref 5–15)
BUN: 12 mg/dL (ref 6–20)
CO2: 21 mmol/L — ABNORMAL LOW (ref 22–32)
Calcium: 9.4 mg/dL (ref 8.9–10.3)
Chloride: 105 mmol/L (ref 98–111)
Creatinine: 1.03 mg/dL — ABNORMAL HIGH (ref 0.44–1.00)
GFR, Est AFR Am: >60 mL/min (ref >60)
GFR, Estimated: >60 mL/min (ref >60)
Glucose, Bld: 137 mg/dL — ABNORMAL HIGH (ref 70–99)
Potassium: 4.2 mmol/L (ref 3.5–5.1)
Sodium: 136 mmol/L (ref 135–145)
Total Bilirubin: 0.3 mg/dL (ref 0.3–1.2)
Total Protein: 6.4 g/dL — ABNORMAL LOW (ref 6.5–8.1)

## 2019-07-19 LAB — CBC WITH DIFFERENTIAL (CANCER CENTER ONLY)
Abs Immature Granulocytes: 0.01 10*3/uL (ref 0.00–0.07)
Basophils Absolute: 0 10*3/uL (ref 0.0–0.1)
Basophils Relative: 0 %
Eosinophils Absolute: 0 10*3/uL (ref 0.0–0.5)
Eosinophils Relative: 0 %
HCT: 25.4 % — ABNORMAL LOW (ref 36.0–46.0)
Hemoglobin: 8.4 g/dL — ABNORMAL LOW (ref 12.0–15.0)
Immature Granulocytes: 0 %
Lymphocytes Relative: 8 %
Lymphs Abs: 0.4 10*3/uL — ABNORMAL LOW (ref 0.7–4.0)
MCH: 32.1 pg (ref 26.0–34.0)
MCHC: 33.1 g/dL (ref 30.0–36.0)
MCV: 96.9 fL (ref 80.0–100.0)
Monocytes Absolute: 0 10*3/uL — ABNORMAL LOW (ref 0.1–1.0)
Monocytes Relative: 1 %
Neutro Abs: 4.1 10*3/uL (ref 1.7–7.7)
Neutrophils Relative %: 91 %
Platelet Count: 239 10*3/uL (ref 150–400)
RBC: 2.62 MIL/uL — ABNORMAL LOW (ref 3.87–5.11)
RDW: 17 % — ABNORMAL HIGH (ref 11.5–15.5)
WBC Count: 4.5 10*3/uL (ref 4.0–10.5)
nRBC: 0 % (ref 0.0–0.2)

## 2019-07-19 MED ORDER — FAMOTIDINE IN NACL 20-0.9 MG/50ML-% IV SOLN
INTRAVENOUS | Status: AC
  Filled 2019-07-19: qty 50

## 2019-07-19 MED ORDER — PALONOSETRON HCL INJECTION 0.25 MG/5ML
0.2500 mg | Freq: Once | INTRAVENOUS | Status: AC
  Administered 2019-07-19: 0.25 mg via INTRAVENOUS

## 2019-07-19 MED ORDER — SODIUM CHLORIDE 0.9 % IV SOLN
365.2000 mg | Freq: Once | INTRAVENOUS | Status: AC
  Administered 2019-07-19: 370 mg via INTRAVENOUS
  Filled 2019-07-19: qty 37

## 2019-07-19 MED ORDER — SODIUM CHLORIDE 0.9 % IV SOLN
140.0000 mg/m2 | Freq: Once | INTRAVENOUS | Status: AC
  Administered 2019-07-19: 240 mg via INTRAVENOUS
  Filled 2019-07-19: qty 40

## 2019-07-19 MED ORDER — SODIUM CHLORIDE 0.9 % IV SOLN
10.0000 mg | Freq: Once | INTRAVENOUS | Status: AC
  Administered 2019-07-19: 10 mg via INTRAVENOUS
  Filled 2019-07-19: qty 10

## 2019-07-19 MED ORDER — PALONOSETRON HCL INJECTION 0.25 MG/5ML
INTRAVENOUS | Status: AC
  Filled 2019-07-19: qty 5

## 2019-07-19 MED ORDER — HEPARIN SOD (PORK) LOCK FLUSH 100 UNIT/ML IV SOLN
500.0000 [IU] | Freq: Once | INTRAVENOUS | Status: DC | PRN
  Filled 2019-07-19: qty 5

## 2019-07-19 MED ORDER — FAMOTIDINE IN NACL 20-0.9 MG/50ML-% IV SOLN
20.0000 mg | Freq: Once | INTRAVENOUS | Status: AC
  Administered 2019-07-19: 20 mg via INTRAVENOUS

## 2019-07-19 MED ORDER — SODIUM CHLORIDE 0.9 % IV SOLN
Freq: Once | INTRAVENOUS | Status: AC
  Filled 2019-07-19: qty 250

## 2019-07-19 MED ORDER — SODIUM CHLORIDE 0.9% FLUSH
10.0000 mL | INTRAVENOUS | Status: DC | PRN
  Filled 2019-07-19: qty 10

## 2019-07-19 MED ORDER — DIPHENHYDRAMINE HCL 50 MG/ML IJ SOLN
50.0000 mg | Freq: Once | INTRAMUSCULAR | Status: AC
  Administered 2019-07-19: 50 mg via INTRAVENOUS

## 2019-07-19 MED ORDER — DIPHENHYDRAMINE HCL 50 MG/ML IJ SOLN
INTRAMUSCULAR | Status: AC
  Filled 2019-07-19: qty 1

## 2019-07-19 MED ORDER — SODIUM CHLORIDE 0.9 % IV SOLN
150.0000 mg | Freq: Once | INTRAVENOUS | Status: AC
  Administered 2019-07-19: 150 mg via INTRAVENOUS
  Filled 2019-07-19: qty 150
  Filled 2019-07-19: qty 5

## 2019-07-19 NOTE — Patient Instructions (Signed)
[facility] Discharge Instructions for Patients Receiving Chemotherapy  Today you received the following chemotherapy agents taxol/carboplatin   To help prevent nausea and vomiting after your treatment, we encourage you to take your nausea medication as directed.     If you develop nausea and vomiting that is not controlled by your nausea medication, call the clinic.   BELOW ARE SYMPTOMS THAT SHOULD BE REPORTED IMMEDIATELY:  *FEVER GREATER THAN 100.5 F  *CHILLS WITH OR WITHOUT FEVER  NAUSEA AND VOMITING THAT IS NOT CONTROLLED WITH YOUR NAUSEA MEDICATION  *UNUSUAL SHORTNESS OF BREATH  *UNUSUAL BRUISING OR BLEEDING  TENDERNESS IN MOUTH AND THROAT WITH OR WITHOUT PRESENCE OF ULCERS  *URINARY PROBLEMS  *BOWEL PROBLEMS  UNUSUAL RASH Items with * indicate a potential emergency and should be followed up as soon as possible.  Feel free to call the clinic you have any questions or concerns. The clinic phone number is [phone].  Paclitaxel injection What is this medicine? PACLITAXEL (PAK li TAX el) is a chemotherapy drug. It targets fast dividing cells, like cancer cells, and causes these cells to die. This medicine is used to treat ovarian cancer, breast cancer, lung cancer, Kaposi's sarcoma, and other cancers. This medicine may be used for other purposes; ask your health care provider or pharmacist if you have questions. COMMON BRAND NAME(S): Onxol, Taxol What should I tell my health care provider before I take this medicine? They need to know if you have any of these conditions:  history of irregular heartbeat  liver disease  low blood counts, like low white cell, platelet, or red cell counts  lung or breathing disease, like asthma  tingling of the fingers or toes, or other nerve disorder  an unusual or allergic reaction to paclitaxel, alcohol, polyoxyethylated castor oil, other chemotherapy, other medicines, foods, dyes, or preservatives  pregnant or  trying to get pregnant  breast-feeding How should I use this medicine? This drug is given as an infusion into a vein. It is administered in a hospital or clinic by a specially trained health care professional. Talk to your pediatrician regarding the use of this medicine in children. Special care may be needed. Overdosage: If you think you have taken too much of this medicine contact a poison control center or emergency room at once. NOTE: This medicine is only for you. Do not share this medicine with others. What if I miss a dose? It is important not to miss your dose. Call your doctor or health care professional if you are unable to keep an appointment. What may interact with this medicine? Do not take this medicine with any of the following medications:  disulfiram  metronidazole This medicine may also interact with the following medications:  antiviral medicines for hepatitis, HIV or AIDS  certain antibiotics like erythromycin and clarithromycin  certain medicines for fungal infections like ketoconazole and itraconazole  certain medicines for seizures like carbamazepine, phenobarbital, phenytoin  gemfibrozil  nefazodone  rifampin  St. John's wort This list may not describe all possible interactions. Give your health care provider a list of all the medicines, herbs, non-prescription drugs, or dietary supplements you use. Also tell them if you smoke, drink alcohol, or use illegal drugs. Some items may interact with your medicine. What should I watch for while using this medicine? Your condition will be monitored carefully while you are receiving this medicine. You will need important blood work done while you are taking this medicine. This medicine can cause serious allergic reactions.  To reduce your risk you will need to take other medicine(s) before treatment with this medicine. If you experience allergic reactions like skin rash, itching or hives, swelling of the face, lips,  or tongue, tell your doctor or health care professional right away. In some cases, you may be given additional medicines to help with side effects. Follow all directions for their use. This drug may make you feel generally unwell. This is not uncommon, as chemotherapy can affect healthy cells as well as cancer cells. Report any side effects. Continue your course of treatment even though you feel ill unless your doctor tells you to stop. Call your doctor or health care professional for advice if you get a fever, chills or sore throat, or other symptoms of a cold or flu. Do not treat yourself. This drug decreases your body's ability to fight infections. Try to avoid being around people who are sick. This medicine may increase your risk to bruise or bleed. Call your doctor or health care professional if you notice any unusual bleeding. Be careful brushing and flossing your teeth or using a toothpick because you may get an infection or bleed more easily. If you have any dental work done, tell your dentist you are receiving this medicine. Avoid taking products that contain aspirin, acetaminophen, ibuprofen, naproxen, or ketoprofen unless instructed by your doctor. These medicines may hide a fever. Do not become pregnant while taking this medicine. Women should inform their doctor if they wish to become pregnant or think they might be pregnant. There is a potential for serious side effects to an unborn child. Talk to your health care professional or pharmacist for more information. Do not breast-feed an infant while taking this medicine. Men are advised not to father a child while receiving this medicine. This product may contain alcohol. Ask your pharmacist or healthcare provider if this medicine contains alcohol. Be sure to tell all healthcare providers you are taking this medicine. Certain medicines, like metronidazole and disulfiram, can cause an unpleasant reaction when taken with alcohol. The reaction  includes flushing, headache, nausea, vomiting, sweating, and increased thirst. The reaction can last from 30 minutes to several hours. What side effects may I notice from receiving this medicine? Side effects that you should report to your doctor or health care professional as soon as possible:  allergic reactions like skin rash, itching or hives, swelling of the face, lips, or tongue  breathing problems  changes in vision  fast, irregular heartbeat  high or low blood pressure  mouth sores  pain, tingling, numbness in the hands or feet  signs of decreased platelets or bleeding - bruising, pinpoint red spots on the skin, black, tarry stools, blood in the urine  signs of decreased red blood cells - unusually weak or tired, feeling faint or lightheaded, falls  signs of infection - fever or chills, cough, sore throat, pain or difficulty passing urine  signs and symptoms of liver injury like dark yellow or brown urine; general ill feeling or flu-like symptoms; light-colored stools; loss of appetite; nausea; right upper belly pain; unusually weak or tired; yellowing of the eyes or skin  swelling of the ankles, feet, hands  unusually slow heartbeat Side effects that usually do not require medical attention (report to your doctor or health care professional if they continue or are bothersome):  diarrhea  hair loss  loss of appetite  muscle or joint pain  nausea, vomiting  pain, redness, or irritation at site where injected  tiredness This  list may not describe all possible side effects. Call your doctor for medical advice about side effects. You may report side effects to FDA at 1-800-FDA-1088. Where should I keep my medicine? This drug is given in a hospital or clinic and will not be stored at home. NOTE: This sheet is a summary. It may not cover all possible information. If you have questions about this medicine, talk to your doctor, pharmacist, or health care provider.   2020 Elsevier/Gold Standard (2016-10-01 13:14:55)  Carboplatin injection What is this medicine? CARBOPLATIN (KAR boe pla tin) is a chemotherapy drug. It targets fast dividing cells, like cancer cells, and causes these cells to die. This medicine is used to treat ovarian cancer and many other cancers. This medicine may be used for other purposes; ask your health care provider or pharmacist if you have questions. COMMON BRAND NAME(S): Paraplatin What should I tell my health care provider before I take this medicine? They need to know if you have any of these conditions:  blood disorders  hearing problems  kidney disease  recent or ongoing radiation therapy  an unusual or allergic reaction to carboplatin, cisplatin, other chemotherapy, other medicines, foods, dyes, or preservatives  pregnant or trying to get pregnant  breast-feeding How should I use this medicine? This drug is usually given as an infusion into a vein. It is administered in a hospital or clinic by a specially trained health care professional. Talk to your pediatrician regarding the use of this medicine in children. Special care may be needed. Overdosage: If you think you have taken too much of this medicine contact a poison control center or emergency room at once. NOTE: This medicine is only for you. Do not share this medicine with others. What if I miss a dose? It is important not to miss a dose. Call your doctor or health care professional if you are unable to keep an appointment. What may interact with this medicine?  medicines for seizures  medicines to increase blood counts like filgrastim, pegfilgrastim, sargramostim  some antibiotics like amikacin, gentamicin, neomycin, streptomycin, tobramycin  vaccines Talk to your doctor or health care professional before taking any of these medicines:  acetaminophen  aspirin  ibuprofen  ketoprofen  naproxen This list may not describe all possible  interactions. Give your health care provider a list of all the medicines, herbs, non-prescription drugs, or dietary supplements you use. Also tell them if you smoke, drink alcohol, or use illegal drugs. Some items may interact with your medicine. What should I watch for while using this medicine? Your condition will be monitored carefully while you are receiving this medicine. You will need important blood work done while you are taking this medicine. This drug may make you feel generally unwell. This is not uncommon, as chemotherapy can affect healthy cells as well as cancer cells. Report any side effects. Continue your course of treatment even though you feel ill unless your doctor tells you to stop. In some cases, you may be given additional medicines to help with side effects. Follow all directions for their use. Call your doctor or health care professional for advice if you get a fever, chills or sore throat, or other symptoms of a cold or flu. Do not treat yourself. This drug decreases your body's ability to fight infections. Try to avoid being around people who are sick. This medicine may increase your risk to bruise or bleed. Call your doctor or health care professional if you notice any unusual bleeding. Be  careful brushing and flossing your teeth or using a toothpick because you may get an infection or bleed more easily. If you have any dental work done, tell your dentist you are receiving this medicine. Avoid taking products that contain aspirin, acetaminophen, ibuprofen, naproxen, or ketoprofen unless instructed by your doctor. These medicines may hide a fever. Do not become pregnant while taking this medicine. Women should inform their doctor if they wish to become pregnant or think they might be pregnant. There is a potential for serious side effects to an unborn child. Talk to your health care professional or pharmacist for more information. Do not breast-feed an infant while taking this  medicine. What side effects may I notice from receiving this medicine? Side effects that you should report to your doctor or health care professional as soon as possible:  allergic reactions like skin rash, itching or hives, swelling of the face, lips, or tongue  signs of infection - fever or chills, cough, sore throat, pain or difficulty passing urine  signs of decreased platelets or bleeding - bruising, pinpoint red spots on the skin, black, tarry stools, nosebleeds  signs of decreased red blood cells - unusually weak or tired, fainting spells, lightheadedness  breathing problems  changes in hearing  changes in vision  chest pain  high blood pressure  low blood counts - This drug may decrease the number of white blood cells, red blood cells and platelets. You may be at increased risk for infections and bleeding.  nausea and vomiting  pain, swelling, redness or irritation at the injection site  pain, tingling, numbness in the hands or feet  problems with balance, talking, walking  trouble passing urine or change in the amount of urine Side effects that usually do not require medical attention (report to your doctor or health care professional if they continue or are bothersome):  hair loss  loss of appetite  metallic taste in the mouth or changes in taste This list may not describe all possible side effects. Call your doctor for medical advice about side effects. You may report side effects to FDA at 1-800-FDA-1088. Where should I keep my medicine? This drug is given in a hospital or clinic and will not be stored at home. NOTE: This sheet is a summary. It may not cover all possible information. If you have questions about this medicine, talk to your doctor, pharmacist, or health care provider.  2020 Elsevier/Gold Standard (2007-05-05 14:38:05)

## 2019-07-20 ENCOUNTER — Telehealth: Payer: Self-pay | Admitting: Hematology and Oncology

## 2019-07-20 ENCOUNTER — Encounter: Payer: Self-pay | Admitting: Hematology and Oncology

## 2019-07-20 NOTE — Progress Notes (Signed)
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[facility]) Clinically, she has less pain She has good urine output and her renal function continues to improve We will proceed with reduced dose chemotherapy I plan to see her again next week with CT imaging and repeat blood work  Anemia due to antineoplastic chemotherapy Her CBC is improved We will proceed with reduced dose chemotherapy I plan to recheck her blood count again next visit in case she needs transfusion support  Bilateral hydronephrosis Her renal function is stable We will monitor closely  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   All questions were answered. The patient knows to call the clinic with any problems, questions or concerns. The total time spent in the appointment was 25 minutes encounter with patients including review of chart and various tests results, discussions about plan of care and coordination of care plan   Heath Lark, MD 07/20/2019 8:52 AM  INTERVAL HISTORY: Please see below for problem oriented charting. She returns with her son for further follow-up Her energy level is fair She denies bleeding Her pain is well controlled No recent nausea She denies constipation.  She uses laxatives as needed  SUMMARY OF ONCOLOGIC HISTORY: Oncology History Overview Note  PD-L1 5%   Malignant neoplasm of endocervix ([place])  09/15/2017 Initial Diagnosis   The patient reports postcoital bleeding since August, 2019 and postmenopausal bleeding since November, 2019. 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    04/16/2018 Surgery   She was taken to the OR on 04/16/18 for a cervical cone biopsy. The specimen revealed a grossly normal cervix on the ectocervix.  Cone specimen revealed squamous cell carcinoma.  When discussing this with the pathologist he reported that in the actual cone specimen itself that was CIN 3 however there was a separate fragment of tissue that included carcinoma.  The dimensions of this was 0.6 cm, however this involved the margins.  A post cone ECC was positive for squamous cell carcinoma, as well as an endometrial curettage which also contain benign endometrial glands.    04/16/2018 Pathology Results   Endocervix curettage: [facility]   04/22/2018 Initial Diagnosis   Malignant neoplasm of endocervix ([place])   04/24/2018 PET scan   1. There is a large mass involving the cervix and endometrium which has a maximum dimension of 5.3 cm within SUV max of 20.4. 2. Small bilateral pelvic sidewall lymph nodes measuring less than 1cm with mild nonspecific uptake. The no hypermetabolic adenopathy or evidence of distant metastatic disease. 3. Nonspecific pulmonary nodules measuring less than 5 mm are identified in the right lung. Too small to characterize by PET-CT.    04/27/2018 Imaging   MRI pelvis  1. Uterine cervix 5.5 x 3.4 x 3.2 cm mass compatible with primary cervical malignancy, with mild parametrial invasion. Stage IIB by MRI. 2. Small volume simple free fluid in the pelvic cul-de-sac. 3. No pathologically enlarged pelvic lymph nodes, see comments.   05/01/2018 Cancer Staging  Staging form: Cervix Uteri, AJCC 8th Edition - Clinical: FIGO Stage IIB (cT2b, cN0, cM0) - Signed by Heath Lark, MD on 05/01/2018   05/08/2018 Procedure   Placement of a subcutaneous port device. Catheter tip at the superior cavoatrial junction.   05/14/2018 - 07/27/2018 Radiation Therapy   Radiation treatment dates:    1.  05/14/2018 - 06/24/2018 2. 5/21, 5/28, 6/1, 6/9, 07/27/2018   Site/dose:    1.         A. pelvsi / 45 Gy in 25 fractions              B. Parametrial  boost / 9 Gy in 5 fractions 2. Cervix, Tandem and Ring System / 27.5 Gy in 5 fractions     05/15/2018 - 06/26/2018 Chemotherapy   The patient had weekly cisplatin   10/27/2018 PET scan   Near complete resolution of FDG uptake at site of previously seen cervical mass. Resolution of previously seen sub-cm pelvic lymph nodes. No evidence of metabolically-active metastatic disease.   05/13/2019 Imaging   1. There is ill-defined, hypoenhancing soft tissue most clearly visualized at the left aspect of the uterus and adnexa, which obstructs the distal left ureter, measuring approximately 4.0 x 3.4 cm. This is concerning for local recurrence of cervical malignancy.   2. Ill-defined, hypoenhancing appearance of the cervix with loss of distinction of the fat plane to the posterior urinary bladder, in keeping with primary malignancy.   3. Interval enlargement of numerous abnormal retroperitoneal and bilateral inguinal lymph nodes, consistent with nodal metastatic disease.    4. There is new moderate left, mild right hydronephrosis and hydroureter, with soft tissue described above obstructing the distal left ureter in the vicinity of the adnexa. There is high-grade obstruction of the distal left ureter with no excreted contrast on delayed phase imaging. Point of obstruction of the right ureter is not clearly visualized, possibly at the ureterovesicular junction.      Genetic Testing   Patient has genetic testing done for PD-L1. Results revealed patient has the following: PD-L1 staining in tumor cells (TC): 5% PD-L1 staining in tumor associated immune cells (IC): <1% PD-L1 combined positive score (CPS): 5%     REVIEW OF SYSTEMS:   Constitutional: Denies fevers, chills or abnormal weight loss Eyes: Denies blurriness of vision Ears, nose, mouth, throat, and  face: Denies mucositis or sore throat Respiratory: Denies cough, dyspnea or wheezes Cardiovascular: Denies palpitation, chest discomfort or lower extremity swelling Gastrointestinal:  Denies nausea, heartburn or change in bowel habits Skin: Denies abnormal skin rashes Lymphatics: Denies new lymphadenopathy or easy bruising Neurological:Denies numbness, tingling or new weaknesses Behavioral/Psych: Mood is stable, no new changes  All other systems were reviewed with the patient and are negative.  I have reviewed the past medical history, past surgical history, social history and family history with the patient and they are unchanged from previous note.  ALLERGIES:  has No Known Allergies.  MEDICATIONS:  Current Outpatient Medications  Medication Sig Dispense Refill  . acetaminophen (TYLENOL) 500 MG tablet Take 1,000 mg by mouth daily as needed for mild pain or moderate pain.     Marland Kitchen dexamethasone (DECADRON) 4 MG tablet Take 1 tablet (4 mg total) by mouth 2 (two) times daily. 36 tablet 0  . methadone (DOLOPHINE) 10 MG tablet Take 1 tablet (10 mg total) by mouth every 12 (twelve) hours. 30 tablet 0  . ondansetron (ZOFRAN) 8 MG tablet Take 1 tablet (8 mg total) by mouth 2 (two) times  daily as needed for refractory nausea / vomiting. Start on day 3 after carboplatin chemo. 30 tablet 1  . oxybutynin (DITROPAN) 5 MG tablet Take 1 tablet (5 mg total) by mouth every 8 (eight) hours as needed for bladder spasms. 30 tablet 1  . oxyCODONE-acetaminophen (PERCOCET/ROXICET) 5-325 MG tablet Take 2 tablets by mouth every 6 (six) hours as needed for severe pain. 60 tablet 0  . prochlorperazine (COMPAZINE) 10 MG tablet Take 1 tablet (10 mg total) by mouth every 6 (six) hours as needed (Nausea or vomiting). 30 tablet 1  . tamsulosin (FLOMAX) 0.4 MG CAPS capsule Take 0.4 mg by mouth 2 (two) times daily.     No current facility-administered medications for this visit.    PHYSICAL EXAMINATION: ECOG PERFORMANCE  STATUS: 1 - Symptomatic but completely ambulatory  Vitals:   07/19/19 1047  BP: 124/73  Pulse: 75  Resp: 18  Temp: 98.3 F (36.8 C)  SpO2: 99%   Filed Weights   07/19/19 1047  Weight: 147 lb 9.6 oz (67 kg)    GENERAL:alert, no distress and comfortable NEURO: alert & oriented x 3 with fluent speech, no focal motor/sensory deficits  LABORATORY DATA:  I have reviewed the data as listed    Component Value Date/Time   NA 136 07/19/2019 1017   K 4.2 07/19/2019 1017   CL 105 07/19/2019 1017   CO2 21 (L) 07/19/2019 1017   GLUCOSE 137 (H) 07/19/2019 1017   BUN 12 07/19/2019 1017   CREATININE 1.03 (H) 07/19/2019 1017   CALCIUM 9.4 07/19/2019 1017   PROT 6.4 (L) 07/19/2019 1017   ALBUMIN 3.5 07/19/2019 1017   AST 15 07/19/2019 1017   ALT 13 07/19/2019 1017   ALKPHOS 75 07/19/2019 1017   BILITOT 0.3 07/19/2019 1017   GFRNONAA >60 07/19/2019 1017   GFRAA >60 07/19/2019 1017    No results found for: SPEP, UPEP  Lab Results  Component Value Date   WBC 4.5 07/19/2019   NEUTROABS 4.1 07/19/2019   HGB 8.4 (L) 07/19/2019   HCT 25.4 (L) 07/19/2019   MCV 96.9 07/19/2019   PLT 239 07/19/2019      Chemistry      Component Value Date/Time   NA 136 07/19/2019 1017   K 4.2 07/19/2019 1017   CL 105 07/19/2019 1017   CO2 21 (L) 07/19/2019 1017   BUN 12 07/19/2019 1017   CREATININE 1.03 (H) 07/19/2019 1017      Component Value Date/Time   CALCIUM 9.4 07/19/2019 1017   ALKPHOS 75 07/19/2019 1017   AST 15 07/19/2019 1017   ALT 13 07/19/2019 1017   BILITOT 0.3 07/19/2019 1017

## 2019-07-20 NOTE — Assessment & Plan Note (Signed)
Her renal function is stable We will monitor closely

## 2019-07-20 NOTE — Assessment & Plan Note (Signed)
She tolerated methadone poorly She would rather stay on Percocet for now I reminded the patient that she cannot exceed the recommended dose of acetaminophen at 1000 mg every 6 hours

## 2019-07-20 NOTE — Assessment & Plan Note (Signed)
Clinically, she has less pain She has good urine output and her renal function continues to improve We will proceed with reduced dose chemotherapy I plan to see her again next week with CT imaging and repeat blood work

## 2019-07-20 NOTE — Assessment & Plan Note (Signed)
Her CBC is improved We will proceed with reduced dose chemotherapy I plan to recheck her blood count again next visit in case she needs transfusion support

## 2019-07-20 NOTE — Telephone Encounter (Signed)
Scheduled per 6/7 sch message. Pt aware of appts.  

## 2019-07-29 ENCOUNTER — Other Ambulatory Visit: Payer: Self-pay | Admitting: Hematology and Oncology

## 2019-07-29 ENCOUNTER — Inpatient Hospital Stay: Payer: Medicaid Other

## 2019-07-29 ENCOUNTER — Other Ambulatory Visit: Payer: Self-pay

## 2019-07-29 ENCOUNTER — Telehealth: Payer: Self-pay

## 2019-07-29 ENCOUNTER — Ambulatory Visit (HOSPITAL_COMMUNITY)
Admission: RE | Admit: 2019-07-29 | Discharge: 2019-07-29 | Disposition: A | Discharge status: Home / Self Care | Payer: Medicaid Other | Source: Ambulatory Visit | Attending: Hematology and Oncology | Admitting: Hematology and Oncology

## 2019-07-29 DIAGNOSIS — C530 Malignant neoplasm of endocervix: Secondary | ICD-10-CM | POA: Diagnosis present

## 2019-07-29 DIAGNOSIS — D61818 Other pancytopenia: Secondary | ICD-10-CM

## 2019-07-29 DIAGNOSIS — T451X5A Adverse effect of antineoplastic and immunosuppressive drugs, initial encounter: Secondary | ICD-10-CM

## 2019-07-29 DIAGNOSIS — D6481 Anemia due to antineoplastic chemotherapy: Secondary | ICD-10-CM

## 2019-07-29 DIAGNOSIS — Z5111 Encounter for antineoplastic chemotherapy: Secondary | ICD-10-CM | POA: Diagnosis not present

## 2019-07-29 LAB — CMP (CANCER CENTER ONLY)
ALT: 8 U/L (ref 0–44)
AST: 12 U/L — ABNORMAL LOW (ref 15–41)
Albumin: 3.2 g/dL — ABNORMAL LOW (ref 3.5–5.0)
Alkaline Phosphatase: 76 U/L (ref 38–126)
Anion gap: 7 (ref 5–15)
BUN: 15 mg/dL (ref 6–20)
CO2: 28 mmol/L (ref 22–32)
Calcium: 9.1 mg/dL (ref 8.9–10.3)
Chloride: 97 mmol/L — ABNORMAL LOW (ref 98–111)
Creatinine: 0.99 mg/dL (ref 0.44–1.00)
GFR, Est AFR Am: >60 mL/min (ref >60)
GFR, Estimated: >60 mL/min (ref >60)
Glucose, Bld: 93 mg/dL (ref 70–99)
Potassium: 3.9 mmol/L (ref 3.5–5.1)
Sodium: 132 mmol/L — ABNORMAL LOW (ref 135–145)
Total Bilirubin: 0.4 mg/dL (ref 0.3–1.2)
Total Protein: 6.1 g/dL — ABNORMAL LOW (ref 6.5–8.1)

## 2019-07-29 LAB — CBC WITH DIFFERENTIAL (CANCER CENTER ONLY)
Abs Immature Granulocytes: 0 10*3/uL (ref 0.00–0.07)
Basophils Absolute: 0 10*3/uL (ref 0.0–0.1)
Basophils Relative: 1 %
Eosinophils Absolute: 0 10*3/uL (ref 0.0–0.5)
Eosinophils Relative: 2 %
HCT: 21.5 % — ABNORMAL LOW (ref 36.0–46.0)
Hemoglobin: 7 g/dL — ABNORMAL LOW (ref 12.0–15.0)
Immature Granulocytes: 0 %
Lymphocytes Relative: 30 %
Lymphs Abs: 0.6 10*3/uL — ABNORMAL LOW (ref 0.7–4.0)
MCH: 32.4 pg (ref 26.0–34.0)
MCHC: 32.6 g/dL (ref 30.0–36.0)
MCV: 99.5 fL (ref 80.0–100.0)
Monocytes Absolute: 0.4 10*3/uL (ref 0.1–1.0)
Monocytes Relative: 21 %
Neutro Abs: 1 10*3/uL — ABNORMAL LOW (ref 1.7–7.7)
Neutrophils Relative %: 46 %
Platelet Count: 169 10*3/uL (ref 150–400)
RBC: 2.16 MIL/uL — ABNORMAL LOW (ref 3.87–5.11)
RDW: 16.4 % — ABNORMAL HIGH (ref 11.5–15.5)
WBC Count: 2.1 10*3/uL — ABNORMAL LOW (ref 4.0–10.5)
nRBC: 0 % (ref 0.0–0.2)

## 2019-07-29 LAB — ABO/RH: ABO/RH(D): B POS

## 2019-07-29 LAB — SAMPLE TO BLOOD BANK

## 2019-07-29 LAB — PREPARE RBC (CROSSMATCH)

## 2019-07-29 MED ORDER — SODIUM CHLORIDE (PF) 0.9 % IJ SOLN
INTRAMUSCULAR | Status: AC
  Filled 2019-07-29: qty 50

## 2019-07-29 MED ORDER — IOHEXOL 300 MG/ML  SOLN
100.0000 mL | Freq: Once | INTRAMUSCULAR | Status: AC | PRN
  Administered 2019-07-29: 100 mL via INTRAVENOUS

## 2019-07-29 NOTE — Telephone Encounter (Signed)
Called and moved appt times for tomorrow. Scheduled at 1020 appt with Dr. Alvy Bimler and 1200 for 2 units of blood. Reminded her to keep blood bracelet on. She verbalized understanding.

## 2019-07-30 ENCOUNTER — Inpatient Hospital Stay (HOSPITAL_BASED_OUTPATIENT_CLINIC_OR_DEPARTMENT_OTHER): Payer: Medicaid Other | Admitting: Hematology and Oncology

## 2019-07-30 ENCOUNTER — Inpatient Hospital Stay: Payer: Medicaid Other

## 2019-07-30 ENCOUNTER — Other Ambulatory Visit: Payer: Self-pay

## 2019-07-30 ENCOUNTER — Ambulatory Visit: Payer: Self-pay | Admitting: Hematology and Oncology

## 2019-07-30 ENCOUNTER — Ambulatory Visit: Payer: Self-pay

## 2019-07-30 ENCOUNTER — Other Ambulatory Visit: Payer: Self-pay | Admitting: Hematology and Oncology

## 2019-07-30 ENCOUNTER — Telehealth: Payer: Self-pay

## 2019-07-30 ENCOUNTER — Encounter: Payer: Self-pay | Admitting: Hematology and Oncology

## 2019-07-30 ENCOUNTER — Inpatient Hospital Stay: Payer: Medicaid Other | Admitting: Hematology and Oncology

## 2019-07-30 VITALS — BP 105/62 | HR 114 | Temp 99.5°F | Resp 18 | Ht 62.0 in | Wt 149.4 lb

## 2019-07-30 DIAGNOSIS — N3001 Acute cystitis with hematuria: Secondary | ICD-10-CM | POA: Diagnosis not present

## 2019-07-30 DIAGNOSIS — C530 Malignant neoplasm of endocervix: Secondary | ICD-10-CM | POA: Diagnosis not present

## 2019-07-30 DIAGNOSIS — G893 Neoplasm related pain (acute) (chronic): Secondary | ICD-10-CM

## 2019-07-30 DIAGNOSIS — K5909 Other constipation: Secondary | ICD-10-CM

## 2019-07-30 DIAGNOSIS — N390 Urinary tract infection, site not specified: Secondary | ICD-10-CM | POA: Insufficient documentation

## 2019-07-30 DIAGNOSIS — D61818 Other pancytopenia: Secondary | ICD-10-CM

## 2019-07-30 DIAGNOSIS — N1330 Unspecified hydronephrosis: Secondary | ICD-10-CM

## 2019-07-30 DIAGNOSIS — R300 Dysuria: Secondary | ICD-10-CM

## 2019-07-30 DIAGNOSIS — Z5111 Encounter for antineoplastic chemotherapy: Secondary | ICD-10-CM | POA: Diagnosis not present

## 2019-07-30 LAB — URINALYSIS, COMPLETE (UACMP) WITH MICROSCOPIC
Bilirubin Urine: NEGATIVE
Glucose, UA: NEGATIVE mg/dL
Ketones, ur: NEGATIVE mg/dL
Nitrite: NEGATIVE
Protein, ur: >=300 mg/dL — AB
RBC / HPF: >50 RBC/hpf — ABNORMAL HIGH (ref 0–5)
Specific Gravity, Urine: 1.023 (ref 1.005–1.030)
WBC, UA: >50 WBC/hpf — ABNORMAL HIGH (ref 0–5)
pH: 6 (ref 5.0–8.0)

## 2019-07-30 MED ORDER — SODIUM CHLORIDE 0.9% IV SOLUTION
250.0000 mL | Freq: Once | INTRAVENOUS | Status: AC
  Administered 2019-07-30: 250 mL via INTRAVENOUS
  Filled 2019-07-30: qty 250

## 2019-07-30 MED ORDER — DIPHENHYDRAMINE HCL 25 MG PO CAPS
ORAL_CAPSULE | ORAL | Status: AC
  Filled 2019-07-30: qty 1

## 2019-07-30 MED ORDER — ACETAMINOPHEN 325 MG PO TABS
650.0000 mg | ORAL_TABLET | Freq: Once | ORAL | Status: AC
  Administered 2019-07-30: 650 mg via ORAL

## 2019-07-30 MED ORDER — AMOXICILLIN-POT CLAVULANATE 875-125 MG PO TABS
1.0000 | ORAL_TABLET | Freq: Two times a day (BID) | ORAL | 0 refills | Status: DC
Start: 2019-07-30 — End: 2019-08-11

## 2019-07-30 MED ORDER — DIPHENHYDRAMINE HCL 25 MG PO CAPS
25.0000 mg | ORAL_CAPSULE | Freq: Once | ORAL | Status: AC
  Administered 2019-07-30: 25 mg via ORAL

## 2019-07-30 MED ORDER — ACETAMINOPHEN 325 MG PO TABS
ORAL_TABLET | ORAL | Status: AC
  Filled 2019-07-30: qty 2

## 2019-07-30 MED ORDER — OXYCODONE-ACETAMINOPHEN 5-325 MG PO TABS: 2.0000 | ORAL_TABLET | Freq: Four times a day (QID) | ORAL | 0 refills | Status: DC | PRN

## 2019-07-30 MED FILL — OXYCODONE-APAP 5-325MG: 5-325 | 7 days supply | Qty: 60 | Fill #0

## 2019-07-30 MED FILL — AMOX-CLAV 875-125 MG TABLET: 875-125 | 7 days supply | Qty: 14 | Fill #0

## 2019-07-30 NOTE — Assessment & Plan Note (Addendum)
Imaging studies show signs suspicious for cystitis versus UTI She has significant symptoms of dysuria Urinalysis is abnormal Urine culture is pending Due to her current state of pancytopenia and increased risk of infection, I recommend 1 week course of antibiotic therapy

## 2019-07-30 NOTE — Telephone Encounter (Signed)
Called her Dr. Alvy Bimler. Urine is abnormal. Augmentin Rx sent to [facility] to take bid. She verbalized understanding.

## 2019-07-30 NOTE — Assessment & Plan Note (Signed)
She has severe pancytopenia due to treatment despite aggressive dose adjustment She is symptomatic from anemia We discussed some of the risks, benefits, and alternatives of blood transfusions. The patient is symptomatic from anemia and the hemoglobin level is critically low.  Some of the side-effects to be expected including risks of transfusion reactions, chills, infection, syndrome of volume overload and risk of hospitalization from various reasons and the patient is willing to proceed and went ahead to sign consent today.

## 2019-07-30 NOTE — Assessment & Plan Note (Signed)
I have reviewed multiple imaging studies with the patient and her sons She has good response to therapy The plan will be to proceed with 3 more cycles of treatment

## 2019-07-30 NOTE — Assessment & Plan Note (Signed)
I recommend she increase MiraLAX to twice a day and to take additional laxative with Senokot I will call her next week for further follow-up

## 2019-07-30 NOTE — Assessment & Plan Note (Signed)
She has poorly controlled pain She has resumed taking methadone I encouraged her to do so I refill her prescription pain medicine today

## 2019-07-30 NOTE — Patient Instructions (Signed)

## 2019-07-30 NOTE — Assessment & Plan Note (Signed)
The hydronephrosis is improved Her renal function is stable She will get urinary stent exchange in the near future by urologist

## 2019-07-30 NOTE — Progress Notes (Signed)
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 The patient is symptomatic from anemia and the hemoglobin level is critically low.  Some of the side-effects to be expected including risks of transfusion reactions, chills, infection, syndrome of volume overload and risk of hospitalization from various reasons and the patient is willing to proceed and went ahead to sign consent today.   Cancer associated pain She has poorly controlled pain She has resumed taking methadone I encouraged her to do so I refill her prescription pain medicine today  Bilateral hydronephrosis The hydronephrosis is improved Her renal function is stable She will get urinary stent exchange in the near future by urologist  Dysuria Imaging studies show signs suspicious for cystitis versus UTI She has significant symptoms of dysuria Urinalysis is abnormal Urine culture is pending Due to her current state of pancytopenia and increased risk of infection, I recommend 1 week course of antibiotic therapy  Other constipation I recommend she increase MiraLAX to twice a day and to take additional laxative with Senokot I will call her next week for further follow-up   Orders Placed This Encounter  Procedures  . Urine Culture    Standing Status:   Future    Number of Occurrences:   1    Standing Expiration Date:   07/29/2020  .  Urinalysis, Complete w Microscopic    Standing Status:   Future    Number of Occurrences:   1    Standing Expiration Date:   07/29/2020    All questions were answered. The patient knows to call the clinic with any problems, questions or concerns. The total time spent in the appointment was 40 minutes encounter with patients including review of chart and various tests results, discussions about plan of care and coordination of care plan   Heath Lark, MD 07/30/2019 11:29 AM  INTERVAL HISTORY: Please see below for problem oriented charting. She returns with her 2 sons for further follow-up She developed significant dysuria starting yesterday She has deep pelvic pain unresolved with Percocet She resumed taking methadone She denies severe nausea She is profoundly constipated  SUMMARY OF ONCOLOGIC HISTORY: Oncology History Overview Note  PD-L1 5%   Malignant neoplasm of endocervix ([place])  09/15/2017 Initial Diagnosis   The patient reports postcoital bleeding since August, 2019 and postmenopausal bleeding since November, 2019. 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    04/16/2018 Surgery   She was taken to the OR on 04/16/18 for a cervical cone biopsy. The specimen revealed a grossly normal cervix on the ectocervix.  Cone specimen revealed squamous cell carcinoma.  When discussing this with the pathologist he reported that in the actual cone specimen itself that was CIN 3 however there was a separate fragment of tissue that included carcinoma.  The dimensions of this was 0.6 cm, however this involved the margins.  A post cone ECC was positive for squamous cell carcinoma, as well as an endometrial curettage which also contain benign endometrial glands.    04/16/2018 Pathology Results   Endocervix curettage: [facility]   04/22/2018 Initial Diagnosis   Malignant neoplasm of endocervix ([place])   04/24/2018 PET scan   1.  There is a large mass involving the cervix and endometrium which has a maximum dimension of 5.3 cm within SUV max of 20.4. 2. Small bilateral pelvic sidewall lymph nodes measuring less than 1cm with mild nonspecific uptake. The no hypermetabolic adenopathy or evidence of distant metastatic disease. 3. Nonspecific pulmonary nodules measuring less than 5 mm are identified in the right lung. Too small to characterize by PET-CT.    04/27/2018 Imaging   MRI pelvis  1. Uterine cervix 5.5 x 3.4 x 3.2 cm mass compatible with primary cervical malignancy, with mild parametrial invasion. Stage IIB by MRI. 2. Small volume simple free fluid in the pelvic cul-de-sac. 3. No pathologically enlarged pelvic lymph nodes, see comments.   05/01/2018 Cancer Staging   Staging form: Cervix Uteri, AJCC 8th Edition - Clinical: FIGO Stage IIB (cT2b, cN0, cM0) - Signed by Heath Lark, MD on 05/01/2018   05/08/2018 Procedure   Placement of a subcutaneous port device. Catheter tip at the superior cavoatrial junction.   05/14/2018 - 07/27/2018 Radiation Therapy   Radiation treatment dates:    1. 05/14/2018 - 06/24/2018 2. 5/21, 5/28, 6/1, 6/9, 07/27/2018   Site/dose:    1.         A. pelvsi / 45 Gy in 25 fractions              B. Parametrial  boost / 9 Gy in 5 fractions 2. Cervix, Tandem and Ring System / 27.5 Gy in 5 fractions     05/15/2018 - 06/26/2018 Chemotherapy   The patient had weekly cisplatin   10/27/2018 PET scan   Near complete resolution of FDG uptake at site of previously seen cervical mass. Resolution of previously seen sub-cm pelvic lymph nodes. No evidence of metabolically-active metastatic disease.   05/13/2019 Imaging   1. There is ill-defined, hypoenhancing soft tissue most clearly visualized at the left aspect of the uterus and adnexa, which obstructs the distal left ureter, measuring approximately 4.0 x 3.4 cm. This is concerning for local recurrence of cervical malignancy.   2. Ill-defined,  hypoenhancing appearance of the cervix with loss of distinction of the fat plane to the posterior urinary bladder, in keeping with primary malignancy.   3. Interval enlargement of numerous abnormal retroperitoneal and bilateral inguinal lymph nodes, consistent with nodal metastatic disease.    4. There is new moderate left, mild right hydronephrosis and hydroureter, with soft tissue described above obstructing the distal left ureter in the vicinity of the adnexa. There is high-grade obstruction of the distal left ureter with no excreted contrast on delayed phase imaging. Point of obstruction of the right ureter is not clearly visualized, possibly at the ureterovesicular junction.      Genetic Testing   Patient has genetic testing done for PD-L1. Results revealed patient has the following: PD-L1 staining in tumor cells (TC): 5% PD-L1 staining in tumor associated immune cells (IC): <1% PD-L1 combined positive score (CPS): 5%   07/29/2019 Imaging   1. Mild improvement in mild left paraaortic retroperitoneal lymphadenopathy. No new or progressive metastatic disease identified. 2. No significant change in size of soft tissue mass in the left adnexa and parametrium. 3. Stable diffuse bladder wall thickening,  consistent with cystitis and likely due to previous radiation therapy. 4. Interval placement of bilateral ureteral stents, with resolution of bilateral hydroureteronephrosis since prior study. 5. Stable diffuse left renal parenchymal atrophy. 6. Large stool burden noted; recommend clinical correlation for possible constipation.     REVIEW OF SYSTEMS:   Constitutional: Denies fevers, chills or abnormal weight loss Eyes: Denies blurriness of vision Ears, nose, mouth, throat, and face: Denies mucositis or sore throat Respiratory: Denies cough, dyspnea or wheezes Cardiovascular: Denies palpitation, chest discomfort or lower extremity swelling Skin: Denies abnormal skin rashes Lymphatics:  Denies new lymphadenopathy or easy bruising Neurological:Denies numbness, tingling or new weaknesses Behavioral/Psych: Mood is stable, no new changes  All other systems were reviewed with the patient and are negative.  I have reviewed the past medical history, past surgical history, social history and family history with the patient and they are unchanged from previous note.  ALLERGIES:  has No Known Allergies.  MEDICATIONS:  Current Outpatient Medications  Medication Sig Dispense Refill  . acetaminophen (TYLENOL) 500 MG tablet Take 1,000 mg by mouth daily as needed for mild pain or moderate pain.     Marland Kitchen amoxicillin-clavulanate (AUGMENTIN) 875-125 MG tablet Take 1 tablet by mouth 2 (two) times daily. 14 tablet 0  . dexamethasone (DECADRON) 4 MG tablet Take 1 tablet (4 mg total) by mouth 2 (two) times daily. 36 tablet 0  . methadone (DOLOPHINE) 10 MG tablet Take 1 tablet (10 mg total) by mouth every 12 (twelve) hours. 30 tablet 0  . ondansetron (ZOFRAN) 8 MG tablet Take 1 tablet (8 mg total) by mouth 2 (two) times daily as needed for refractory nausea / vomiting. Start on day 3 after carboplatin chemo. 30 tablet 1  . oxybutynin (DITROPAN) 5 MG tablet Take 1 tablet (5 mg total) by mouth every 8 (eight) hours as needed for bladder spasms. 30 tablet 1  . oxyCODONE-acetaminophen (PERCOCET/ROXICET) 5-325 MG tablet Take 2 tablets by mouth every 6 (six) hours as needed for severe pain. 60 tablet 0  . prochlorperazine (COMPAZINE) 10 MG tablet Take 1 tablet (10 mg total) by mouth every 6 (six) hours as needed (Nausea or vomiting). 30 tablet 1  . tamsulosin (FLOMAX) 0.4 MG CAPS capsule Take 0.4 mg by mouth 2 (two) times daily.     No current facility-administered medications for this visit.    PHYSICAL EXAMINATION: ECOG PERFORMANCE STATUS: 2 - Symptomatic, <50% confined to bed  Vitals:   07/30/19 1006  BP: 105/62  Pulse: (!) 114  Resp: 18  Temp: 99.5 F (37.5 C)  SpO2: 100%   Filed Weights    07/30/19 1006  Weight: 149 lb 6.4 oz (67.8 kg)    GENERAL:alert, no distress and comfortable.  She looks pale  LABORATORY DATA:  I have reviewed the data as listed    Component Value Date/Time   NA 132 (L) 07/29/2019 0945   K 3.9 07/29/2019 0945   CL 97 (L) 07/29/2019 0945   CO2 28 07/29/2019 0945   GLUCOSE 93 07/29/2019 0945   BUN 15 07/29/2019 0945   CREATININE 0.99 07/29/2019 0945   CALCIUM 9.1 07/29/2019 0945   PROT 6.1 (L) 07/29/2019 0945   ALBUMIN 3.2 (L) 07/29/2019 0945   AST 12 (L) 07/29/2019 0945   ALT 8 07/29/2019 0945   ALKPHOS 76 07/29/2019 0945   BILITOT 0.4 07/29/2019 0945   GFRNONAA >60 07/29/2019 0945   GFRAA >60 07/29/2019 0945    No results found for: SPEP, UPEP  Lab  Results  Component Value Date   WBC 2.1 (L) 07/29/2019   NEUTROABS 1.0 (L) 07/29/2019   HGB 7.0 (L) 07/29/2019   HCT 21.5 (L) 07/29/2019   MCV 99.5 07/29/2019   PLT 169 07/29/2019      Chemistry      Component Value Date/Time   NA 132 (L) 07/29/2019 0945   K 3.9 07/29/2019 0945   CL 97 (L) 07/29/2019 0945   CO2 28 07/29/2019 0945   BUN 15 07/29/2019 0945   CREATININE 0.99 07/29/2019 0945      Component Value Date/Time   CALCIUM 9.1 07/29/2019 0945   ALKPHOS 76 07/29/2019 0945   AST 12 (L) 07/29/2019 0945   ALT 8 07/29/2019 0945   BILITOT 0.4 07/29/2019 0945       RADIOGRAPHIC STUDIES: I have reviewed multiple imaging studies with the patient and her family I have personally reviewed the radiological images as listed and agreed with the findings in the report. CT ABDOMEN PELVIS W CONTRAST  Result Date: 07/29/2019 CLINICAL DATA:  Follow-up cervical carcinoma. Currently undergoing chemotherapy. Mid and lower abdominal pain. Dysuria. EXAM: CT ABDOMEN AND PELVIS WITH CONTRAST TECHNIQUE: Multidetector CT imaging of the abdomen and pelvis was performed using the standard protocol following bolus administration of intravenous contrast. CONTRAST:  137m OMNIPAQUE IOHEXOL 300  MG/ML  SOLN COMPARISON:  05/13/2019 FINDINGS: Lower Chest: No acute findings. Hepatobiliary: No hepatic masses identified. Gallbladder is unremarkable. No evidence of biliary ductal dilatation. Pancreas:  No mass or inflammatory changes. Spleen: Within normal limits in size and appearance. Adrenals/Urinary Tract: No adrenal or renal masses are identified. Diffuse left renal parenchymal atrophy is again demonstrated. There has been placement of bilateral ureteral stents in appropriate position since prior study, with resolution of bilateral hydroureteronephrosis since prior study. Diffuse bladder wall thickening is again seen which is consistent with cystitis and likely due to previous radiation therapy. Stomach/Bowel: No evidence of obstruction, inflammatory process or abnormal fluid collections. Normal appendix visualized. Large amount of stool seen throughout the colon, but significant change compared to prior study. Vascular/Lymphatic: Mild retroperitoneal lymphadenopathy is seen in the left paraaortic region, but shows mild improvement since previous study. Index lymph node on image 41/2 currently measures 12 x 9 mm, compared to 17 x 10 mm prior study. No new sites of lymphadenopathy identified. No evidence abdominal aortic aneurysm. Reproductive: Uterus and right adnexa are unremarkable in appearance. A low-attenuation mass is again seen in the left adnexa and parametrium which measures 3.7 x 3.5 cm on image 75/2. This compares to 3.9 x 3.3 cm when remeasured on previous study. No new or enlarging masses are identified. No evidence of ascites. Other:  None. Musculoskeletal:  No suspicious bone lesions identified. IMPRESSION: 1. Mild improvement in mild left paraaortic retroperitoneal lymphadenopathy. No new or progressive metastatic disease identified. 2. No significant change in size of soft tissue mass in the left adnexa and parametrium. 3. Stable diffuse bladder wall thickening, consistent with cystitis and  likely due to previous radiation therapy. 4. Interval placement of bilateral ureteral stents, with resolution of bilateral hydroureteronephrosis since prior study. 5. Stable diffuse left renal parenchymal atrophy. 6. Large stool burden noted; recommend clinical correlation for possible constipation. Electronically Signed   By: JMarlaine HindM.D.   On: 07/29/2019 14:20

## 2019-07-31 LAB — URINE CULTURE: Culture: >=100000 — AB

## 2019-08-01 LAB — TYPE AND SCREEN
ABO/RH(D): B POS
Antibody Screen: NEGATIVE
Unit division: 0
Unit division: 0

## 2019-08-01 LAB — BPAM RBC
Blood Product Expiration Date: 202107102359
Blood Product Expiration Date: 202107102359
ISSUE DATE / TIME: 202106181038
ISSUE DATE / TIME: 202106181038
Unit Type and Rh: 7300
Unit Type and Rh: 7300

## 2019-08-02 ENCOUNTER — Telehealth: Payer: Self-pay

## 2019-08-02 ENCOUNTER — Telehealth: Payer: Self-pay | Admitting: Hematology and Oncology

## 2019-08-02 NOTE — Telephone Encounter (Signed)
Called and left below message. 

## 2019-08-02 NOTE — Telephone Encounter (Signed)
Scheduled per 6/18 sch message. Pt is aware of appts

## 2019-08-02 NOTE — Telephone Encounter (Signed)
Called and given below message. She verbalized understanding. Denies pain and UTI symptoms have resolved. She is complaining of having multiple diarrhea stools a day. No formed stool. She is gong to stop the senokot and take Miralax BID. She is complaining of right leg swelling after being active over the weekend. She will apply compression stockings She has a beach trip planned in July. She is asking if it is okay to go to the beach and any restrictions?

## 2019-08-02 NOTE — Telephone Encounter (Signed)
-----   Message from Heath Lark, MD sent at 08/02/2019  8:14 AM EDT ----- Regarding: can you call and ask how is her pain? is UTI symptoms better?

## 2019-08-02 NOTE — Telephone Encounter (Signed)
I can review with her about her beach trip when I see her next week

## 2019-08-03 ENCOUNTER — Telehealth: Payer: Self-pay | Admitting: Oncology

## 2019-08-03 MED FILL — TAMSULOSIN HCL 0.4 MG CAP: 0.4 | 30 days supply | Qty: 60 | Fill #0

## 2019-08-03 NOTE — Telephone Encounter (Signed)
Karon left a message requesting Dr. Calton Dach office note to be faxed to Dr. Jackson Latino office.  Faxed office note to [facility] at [phone].

## 2019-08-04 ENCOUNTER — Telehealth: Payer: Self-pay | Admitting: Oncology

## 2019-08-04 NOTE — Telephone Encounter (Signed)
[place] called and said she had two episodes of blood in her urine today.  She said she was feeling better today and was up moving around and thinks this might be why she had the bleeding.  Discussed that she should call [facility] because it could be a side effect from her stents.  She called back after calling [facility] and they said not to be concerned. That is was from her being more active today.

## 2019-08-05 ENCOUNTER — Other Ambulatory Visit: Payer: Self-pay | Admitting: Urology

## 2019-08-05 ENCOUNTER — Telehealth: Payer: Self-pay | Admitting: Oncology

## 2019-08-05 NOTE — Telephone Encounter (Signed)
Sheena Torres called and said she is having her stents exchanged on 09/01/19.  She wants to make sure this is ok with her next chemotherapy apt on 08/11/19.

## 2019-08-06 NOTE — Telephone Encounter (Signed)
Called Ik[place] and advised her of message below.

## 2019-08-06 NOTE — Telephone Encounter (Signed)
Ok for now

## 2019-08-10 ENCOUNTER — Ambulatory Visit: Payer: Self-pay | Admitting: Gynecologic Oncology

## 2019-08-11 ENCOUNTER — Inpatient Hospital Stay: Payer: Medicaid Other

## 2019-08-11 ENCOUNTER — Other Ambulatory Visit: Payer: Self-pay

## 2019-08-11 ENCOUNTER — Inpatient Hospital Stay (HOSPITAL_BASED_OUTPATIENT_CLINIC_OR_DEPARTMENT_OTHER): Payer: Medicaid Other | Admitting: Hematology and Oncology

## 2019-08-11 ENCOUNTER — Other Ambulatory Visit: Payer: Self-pay | Admitting: Hematology and Oncology

## 2019-08-11 ENCOUNTER — Encounter: Payer: Self-pay | Admitting: Hematology and Oncology

## 2019-08-11 VITALS — BP 121/78 | HR 59 | Temp 98.2°F | Resp 18 | Ht 62.0 in | Wt 145.0 lb

## 2019-08-11 DIAGNOSIS — G893 Neoplasm related pain (acute) (chronic): Secondary | ICD-10-CM

## 2019-08-11 DIAGNOSIS — D61818 Other pancytopenia: Secondary | ICD-10-CM

## 2019-08-11 DIAGNOSIS — K5909 Other constipation: Secondary | ICD-10-CM

## 2019-08-11 DIAGNOSIS — Z5111 Encounter for antineoplastic chemotherapy: Secondary | ICD-10-CM | POA: Diagnosis not present

## 2019-08-11 DIAGNOSIS — D6481 Anemia due to antineoplastic chemotherapy: Secondary | ICD-10-CM

## 2019-08-11 DIAGNOSIS — C530 Malignant neoplasm of endocervix: Secondary | ICD-10-CM

## 2019-08-11 DIAGNOSIS — N1330 Unspecified hydronephrosis: Secondary | ICD-10-CM

## 2019-08-11 DIAGNOSIS — N3001 Acute cystitis with hematuria: Secondary | ICD-10-CM

## 2019-08-11 DIAGNOSIS — T451X5A Adverse effect of antineoplastic and immunosuppressive drugs, initial encounter: Secondary | ICD-10-CM

## 2019-08-11 LAB — CBC WITH DIFFERENTIAL (CANCER CENTER ONLY)
Abs Immature Granulocytes: 0.06 10*3/uL (ref 0.00–0.07)
Basophils Absolute: 0 10*3/uL (ref 0.0–0.1)
Basophils Relative: 0 %
Eosinophils Absolute: 0 10*3/uL (ref 0.0–0.5)
Eosinophils Relative: 0 %
HCT: 32.4 % — ABNORMAL LOW (ref 36.0–46.0)
Hemoglobin: 10.8 g/dL — ABNORMAL LOW (ref 12.0–15.0)
Immature Granulocytes: 1 %
Lymphocytes Relative: 10 %
Lymphs Abs: 0.5 10*3/uL — ABNORMAL LOW (ref 0.7–4.0)
MCH: 31.6 pg (ref 26.0–34.0)
MCHC: 33.3 g/dL (ref 30.0–36.0)
MCV: 94.7 fL (ref 80.0–100.0)
Monocytes Absolute: 0.1 10*3/uL (ref 0.1–1.0)
Monocytes Relative: 1 %
Neutro Abs: 4.4 10*3/uL (ref 1.7–7.7)
Neutrophils Relative %: 88 %
Platelet Count: 114 10*3/uL — ABNORMAL LOW (ref 150–400)
RBC: 3.42 MIL/uL — ABNORMAL LOW (ref 3.87–5.11)
RDW: 15.5 % (ref 11.5–15.5)
WBC Count: 5 10*3/uL (ref 4.0–10.5)
nRBC: 0 % (ref 0.0–0.2)

## 2019-08-11 LAB — CMP (CANCER CENTER ONLY)
ALT: 10 U/L (ref 0–44)
AST: 15 U/L (ref 15–41)
Albumin: 3.5 g/dL (ref 3.5–5.0)
Alkaline Phosphatase: 83 U/L (ref 38–126)
Anion gap: 12 (ref 5–15)
BUN: 13 mg/dL (ref 6–20)
CO2: 21 mmol/L — ABNORMAL LOW (ref 22–32)
Calcium: 9.5 mg/dL (ref 8.9–10.3)
Chloride: 106 mmol/L (ref 98–111)
Creatinine: 1.04 mg/dL — ABNORMAL HIGH (ref 0.44–1.00)
GFR, Est AFR Am: >60 mL/min (ref >60)
GFR, Estimated: >60 mL/min (ref >60)
Glucose, Bld: 148 mg/dL — ABNORMAL HIGH (ref 70–99)
Potassium: 4.3 mmol/L (ref 3.5–5.1)
Sodium: 139 mmol/L (ref 135–145)
Total Bilirubin: 0.3 mg/dL (ref 0.3–1.2)
Total Protein: 6.8 g/dL (ref 6.5–8.1)

## 2019-08-11 LAB — URINALYSIS, COMPLETE (UACMP) WITH MICROSCOPIC
Bilirubin Urine: NEGATIVE
Glucose, UA: NEGATIVE mg/dL
Ketones, ur: NEGATIVE mg/dL
Nitrite: NEGATIVE
Protein, ur: NEGATIVE mg/dL
RBC / HPF: >50 RBC/hpf — ABNORMAL HIGH (ref 0–5)
Specific Gravity, Urine: 1.017 (ref 1.005–1.030)
pH: 7 (ref 5.0–8.0)

## 2019-08-11 LAB — SAMPLE TO BLOOD BANK

## 2019-08-11 MED ORDER — FAMOTIDINE IN NACL 20-0.9 MG/50ML-% IV SOLN
20.0000 mg | Freq: Once | INTRAVENOUS | Status: AC
  Administered 2019-08-11: 20 mg via INTRAVENOUS

## 2019-08-11 MED ORDER — DIPHENHYDRAMINE HCL 50 MG/ML IJ SOLN
INTRAMUSCULAR | Status: AC
  Filled 2019-08-11: qty 1

## 2019-08-11 MED ORDER — SODIUM CHLORIDE 0.9 % IV SOLN: 370.0000 mg | Freq: Once | INTRAVENOUS | Status: DC

## 2019-08-11 MED ORDER — PALONOSETRON HCL INJECTION 0.25 MG/5ML
INTRAVENOUS | Status: AC
  Filled 2019-08-11: qty 5

## 2019-08-11 MED ORDER — SODIUM CHLORIDE 0.9 % IV SOLN
150.0000 mg | Freq: Once | INTRAVENOUS | Status: AC
  Administered 2019-08-11: 150 mg via INTRAVENOUS
  Filled 2019-08-11: qty 150

## 2019-08-11 MED ORDER — SODIUM CHLORIDE 0.9 % IV SOLN
10.0000 mg | Freq: Once | INTRAVENOUS | Status: AC
  Administered 2019-08-11: 10 mg via INTRAVENOUS
  Filled 2019-08-11: qty 10

## 2019-08-11 MED ORDER — SODIUM CHLORIDE 0.9 % IV SOLN
140.0000 mg/m2 | Freq: Once | INTRAVENOUS | Status: AC
  Administered 2019-08-11: 240 mg via INTRAVENOUS
  Filled 2019-08-11: qty 40

## 2019-08-11 MED ORDER — PALONOSETRON HCL INJECTION 0.25 MG/5ML
0.2500 mg | Freq: Once | INTRAVENOUS | Status: AC
  Administered 2019-08-11: 0.25 mg via INTRAVENOUS

## 2019-08-11 MED ORDER — SODIUM CHLORIDE 0.9 % IV SOLN
Freq: Once | INTRAVENOUS | Status: AC
  Filled 2019-08-11: qty 250

## 2019-08-11 MED ORDER — FAMOTIDINE IN NACL 20-0.9 MG/50ML-% IV SOLN
INTRAVENOUS | Status: AC
  Filled 2019-08-11: qty 50

## 2019-08-11 MED ORDER — SODIUM CHLORIDE 0.9 % IV SOLN
370.0000 mg | Freq: Once | INTRAVENOUS | Status: AC
  Administered 2019-08-11: 370 mg via INTRAVENOUS
  Filled 2019-08-11: qty 37

## 2019-08-11 MED ORDER — DIPHENHYDRAMINE HCL 50 MG/ML IJ SOLN
50.0000 mg | Freq: Once | INTRAMUSCULAR | Status: AC
  Administered 2019-08-11: 50 mg via INTRAVENOUS

## 2019-08-11 MED ORDER — SODIUM CHLORIDE 0.9 % IV SOLN: 356.8000 mg | Freq: Once | INTRAVENOUS | Status: DC

## 2019-08-11 NOTE — Assessment & Plan Note (Signed)
Her constipation has resolved We have extensive discussions about the importance of aggressive laxative therapy

## 2019-08-11 NOTE — Assessment & Plan Note (Signed)
The hydronephrosis is improved Her renal function is stable She will get urinary stent exchange in the near future by urologist

## 2019-08-11 NOTE — Assessment & Plan Note (Signed)
Her recent CT scan show excellent response to therapy The plan will be to proceed with 3 more cycles of treatment

## 2019-08-11 NOTE — Patient Instructions (Addendum)
[facility] °Discharge Instructions for Patients Receiving Chemotherapy ° °Today you received the following chemotherapy agents Taxol; Carboplatin ° °To help prevent nausea and vomiting after your treatment, we encourage you to take your nausea medication as directed °  °If you develop nausea and vomiting that is not controlled by your nausea medication, call the clinic.  ° °BELOW ARE SYMPTOMS THAT SHOULD BE REPORTED IMMEDIATELY: °· *FEVER GREATER THAN 100.5 F °· *CHILLS WITH OR WITHOUT FEVER °· NAUSEA AND VOMITING THAT IS NOT CONTROLLED WITH YOUR NAUSEA MEDICATION °· *UNUSUAL SHORTNESS OF BREATH °· *UNUSUAL BRUISING OR BLEEDING °· TENDERNESS IN MOUTH AND THROAT WITH OR WITHOUT PRESENCE OF ULCERS °· *URINARY PROBLEMS °· *BOWEL PROBLEMS °· UNUSUAL RASH °Items with * indicate a potential emergency and should be followed up as soon as possible. ° °Feel free to call the clinic should you have any questions or concerns. The clinic phone number is [phone]. ° °Please show the CHEMO ALERT CARD at check-in to the Emergency Department and triage nurse. ° ° °

## 2019-08-11 NOTE — Assessment & Plan Note (Signed)
She had recent severe urinary tract infection Her cystitis pain has resolved but she still have intermittent hematuria I plan to recheck urinalysis and urine culture today

## 2019-08-11 NOTE — Assessment & Plan Note (Signed)
Pancytopenia has resolved She does not need transfusion support today We will proceed with treatment as scheduled

## 2019-08-11 NOTE — Progress Notes (Signed)
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[facility]) Her recent CT scan show excellent response to therapy The plan will be to proceed with 3 more cycles of treatment   Pancytopenia, acquired ([place]) Pancytopenia has resolved She does not need transfusion support today We will proceed with treatment as scheduled  Bilateral hydronephrosis The hydronephrosis is improved Her renal function is stable She will get urinary stent exchange in the near future by urologist  UTI (urinary tract infection) She had recent severe urinary tract infection Her cystitis pain has resolved but she still have intermittent hematuria I plan to recheck urinalysis and urine culture today  Cancer associated pain Her pain control is stable She will continue pain medicine as needed We discussed narcotic refill policy  Other constipation Her constipation has resolved We have extensive discussions about the importance of aggressive laxative therapy   Orders Placed This Encounter  Procedures  . Urine Culture    Standing Status:   Future    Standing Expiration Date:   08/10/2020  . Urinalysis, Complete w Microscopic    Standing Status:   Future    Standing Expiration Date:   08/10/2020    All questions were answered. The patient knows to call the clinic with any problems, questions or concerns. The total time spent in the appointment was 25 minutes encounter with patients including review of chart and various tests results, discussions about plan of care and coordination of care plan   Heath Lark, MD 08/11/2019 8:47 AM  INTERVAL HISTORY: Please see below for problem oriented charting. She returns with her 2 sons for further follow-up Her cystitis pain has resolved but she still have intermittent  hematuria Constipation has resolved Her pain control is excellent She denies recent nausea  SUMMARY OF ONCOLOGIC HISTORY: Oncology History Overview Note  PD-L1 5%   Malignant neoplasm of endocervix ([place])  09/15/2017 Initial Diagnosis   The patient reports postcoital bleeding since August, 2019 and postmenopausal bleeding since November, 2019. 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    04/16/2018 Surgery   She was taken to the OR on 04/16/18 for a cervical cone biopsy. The specimen revealed a grossly normal cervix on the ectocervix.  Cone specimen revealed squamous cell carcinoma.  When discussing this with the pathologist he reported that in the actual cone specimen itself that was CIN 3 however there was a separate fragment of tissue that included carcinoma.  The dimensions of this was 0.6 cm, however this involved the margins.  A post cone ECC was positive for squamous cell carcinoma, as well as an endometrial curettage which also contain benign endometrial glands.    04/16/2018 Pathology Results   Endocervix curettage: [facility]   04/22/2018 Initial Diagnosis   Malignant neoplasm of endocervix ([place])   04/24/2018 PET scan   1. There is a large mass involving the cervix and endometrium which has a maximum dimension of 5.3 cm within SUV max of 20.4. 2. Small bilateral pelvic sidewall lymph nodes measuring less than 1cm with mild nonspecific uptake. The no hypermetabolic adenopathy or evidence of distant metastatic disease. 3. Nonspecific pulmonary nodules measuring less than 5 mm are identified in the right lung. Too small to characterize by PET-CT.  04/27/2018 Imaging   MRI pelvis  1. Uterine cervix 5.5 x 3.4 x 3.2 cm mass compatible with primary cervical malignancy, with mild parametrial invasion. Stage IIB by MRI. 2. Small volume simple free fluid in the pelvic cul-de-sac. 3. No pathologically enlarged pelvic  lymph nodes, see comments.   05/01/2018 Cancer Staging   Staging form: Cervix Uteri, AJCC 8th Edition - Clinical: FIGO Stage IIB (cT2b, cN0, cM0) - Signed by Heath Lark, MD on 05/01/2018   05/08/2018 Procedure   Placement of a subcutaneous port device. Catheter tip at the superior cavoatrial junction.   05/14/2018 - 07/27/2018 Radiation Therapy   Radiation treatment dates:    1. 05/14/2018 - 06/24/2018 2. 5/21, 5/28, 6/1, 6/9, 07/27/2018   Site/dose:    1.         A. pelvsi / 45 Gy in 25 fractions              B. Parametrial  boost / 9 Gy in 5 fractions 2. Cervix, Tandem and Ring System / 27.5 Gy in 5 fractions     05/15/2018 - 06/26/2018 Chemotherapy   The patient had weekly cisplatin   10/27/2018 PET scan   Near complete resolution of FDG uptake at site of previously seen cervical mass. Resolution of previously seen sub-cm pelvic lymph nodes. No evidence of metabolically-active metastatic disease.   05/13/2019 Imaging   1. There is ill-defined, hypoenhancing soft tissue most clearly visualized at the left aspect of the uterus and adnexa, which obstructs the distal left ureter, measuring approximately 4.0 x 3.4 cm. This is concerning for local recurrence of cervical malignancy.   2. Ill-defined, hypoenhancing appearance of the cervix with loss of distinction of the fat plane to the posterior urinary bladder, in keeping with primary malignancy.   3. Interval enlargement of numerous abnormal retroperitoneal and bilateral inguinal lymph nodes, consistent with nodal metastatic disease.    4. There is new moderate left, mild right hydronephrosis and hydroureter, with soft tissue described above obstructing the distal left ureter in the vicinity of the adnexa. There is high-grade obstruction of the distal left ureter with no excreted contrast on delayed phase imaging. Point of obstruction of the right ureter is not clearly visualized, possibly at the ureterovesicular junction.      Genetic Testing    Patient has genetic testing done for PD-L1. Results revealed patient has the following: PD-L1 staining in tumor cells (TC): 5% PD-L1 staining in tumor associated immune cells (IC): <1% PD-L1 combined positive score (CPS): 5%   07/29/2019 Imaging   1. Mild improvement in mild left paraaortic retroperitoneal lymphadenopathy. No new or progressive metastatic disease identified. 2. No significant change in size of soft tissue mass in the left adnexa and parametrium. 3. Stable diffuse bladder wall thickening, consistent with cystitis and likely due to previous radiation therapy. 4. Interval placement of bilateral ureteral stents, with resolution of bilateral hydroureteronephrosis since prior study. 5. Stable diffuse left renal parenchymal atrophy. 6. Large stool burden noted; recommend clinical correlation for possible constipation.     REVIEW OF SYSTEMS:   Constitutional: Denies fevers, chills or abnormal weight loss Eyes: Denies blurriness of vision Ears, nose, mouth, throat, and face: Denies mucositis or sore throat Respiratory: Denies cough, dyspnea or wheezes Cardiovascular: Denies palpitation, chest discomfort or lower extremity swelling Skin: Denies abnormal skin rashes Lymphatics: Denies new lymphadenopathy or easy bruising Neurological:Denies numbness, tingling or new weaknesses Behavioral/Psych: Mood is stable, no new changes  All other systems were reviewed with the patient  and are negative.  I have reviewed the past medical history, past surgical history, social history and family history with the patient and they are unchanged from previous note.  ALLERGIES:  has No Known Allergies.  MEDICATIONS:  Current Outpatient Medications  Medication Sig Dispense Refill  . acetaminophen (TYLENOL) 500 MG tablet Take 1,000 mg by mouth daily as needed for mild pain or moderate pain.     Marland Kitchen dexamethasone (DECADRON) 4 MG tablet Take 1 tablet (4 mg total) by mouth 2 (two) times daily. 36  tablet 0  . methadone (DOLOPHINE) 10 MG tablet Take 1 tablet (10 mg total) by mouth every 12 (twelve) hours. 30 tablet 0  . ondansetron (ZOFRAN) 8 MG tablet Take 1 tablet (8 mg total) by mouth 2 (two) times daily as needed for refractory nausea / vomiting. Start on day 3 after carboplatin chemo. 30 tablet 1  . oxybutynin (DITROPAN) 5 MG tablet Take 1 tablet (5 mg total) by mouth every 8 (eight) hours as needed for bladder spasms. 30 tablet 1  . oxyCODONE-acetaminophen (PERCOCET/ROXICET) 5-325 MG tablet Take 2 tablets by mouth every 6 (six) hours as needed for severe pain. 60 tablet 0  . prochlorperazine (COMPAZINE) 10 MG tablet Take 1 tablet (10 mg total) by mouth every 6 (six) hours as needed (Nausea or vomiting). 30 tablet 1  . tamsulosin (FLOMAX) 0.4 MG CAPS capsule Take 0.4 mg by mouth 2 (two) times daily.     No current facility-administered medications for this visit.    PHYSICAL EXAMINATION: ECOG PERFORMANCE STATUS: 1 - Symptomatic but completely ambulatory  Vitals:   08/11/19 0836  BP: 121/78  Pulse: (!) 59  Resp: 18  Temp: 98.2 F (36.8 C)  SpO2: 100%   Filed Weights   08/11/19 0836  Weight: 145 lb (65.8 kg)    GENERAL:alert, no distress and comfortable Musculoskeletal:no cyanosis of digits and no clubbing  NEURO: alert & oriented x 3 with fluent speech, no focal motor/sensory deficits  LABORATORY DATA:  I have reviewed the data as listed    Component Value Date/Time   NA 132 (L) 07/29/2019 0945   K 3.9 07/29/2019 0945   CL 97 (L) 07/29/2019 0945   CO2 28 07/29/2019 0945   GLUCOSE 93 07/29/2019 0945   BUN 15 07/29/2019 0945   CREATININE 0.99 07/29/2019 0945   CALCIUM 9.1 07/29/2019 0945   PROT 6.1 (L) 07/29/2019 0945   ALBUMIN 3.2 (L) 07/29/2019 0945   AST 12 (L) 07/29/2019 0945   ALT 8 07/29/2019 0945   ALKPHOS 76 07/29/2019 0945   BILITOT 0.4 07/29/2019 0945   GFRNONAA >60 07/29/2019 0945   GFRAA >60 07/29/2019 0945    No results found for: SPEP,  UPEP  Lab Results  Component Value Date   WBC 5.0 08/11/2019   NEUTROABS 4.4 08/11/2019   HGB 10.8 (L) 08/11/2019   HCT 32.4 (L) 08/11/2019   MCV 94.7 08/11/2019   PLT 114 (L) 08/11/2019      Chemistry      Component Value Date/Time   NA 132 (L) 07/29/2019 0945   K 3.9 07/29/2019 0945   CL 97 (L) 07/29/2019 0945   CO2 28 07/29/2019 0945   BUN 15 07/29/2019 0945   CREATININE 0.99 07/29/2019 0945      Component Value Date/Time   CALCIUM 9.1 07/29/2019 0945   ALKPHOS 76 07/29/2019 0945   AST 12 (L) 07/29/2019 0945   ALT 8 07/29/2019 0945   BILITOT 0.4 07/29/2019 0945

## 2019-08-11 NOTE — Assessment & Plan Note (Signed)
Her pain control is stable She will continue pain medicine as needed We discussed narcotic refill policy

## 2019-08-12 ENCOUNTER — Other Ambulatory Visit: Payer: Self-pay | Admitting: Hematology and Oncology

## 2019-08-12 ENCOUNTER — Ambulatory Visit: Payer: Self-pay | Admitting: Radiation Oncology

## 2019-08-12 ENCOUNTER — Telehealth: Payer: Self-pay | Admitting: Oncology

## 2019-08-12 LAB — URINE CULTURE: Culture: <10000 — AB

## 2019-08-12 NOTE — Telephone Encounter (Signed)
Monitor the vaginal bleeding for now, call back next Tuesday for update It should subside I reduced future benadryl dose

## 2019-08-12 NOTE — Telephone Encounter (Signed)
Sheena Torres called and said she woke up during the night and had blood in her urine from her stents.  When she woke up this morning, she had vaginal bleeding like a period.  She said the bleeding is steady but not gushing.  She is concerned about what to do.  She also said she had 50 mg of Benadryl with her treatment yesterday and slept for 2 hours.  When she woke up she was urinating and didn't know it.  She is wondering if the Benadryl needs to be reduced for her next treatment.

## 2019-08-12 NOTE — Telephone Encounter (Signed)
Called and given below message.  Instructed to call the office if needed.She understanding understanding.

## 2019-08-13 ENCOUNTER — Telehealth: Payer: Self-pay

## 2019-08-13 NOTE — Telephone Encounter (Signed)
-----   Message from Heath Lark, MD sent at 08/13/2019  8:16 AM EDT ----- Regarding: let her know urine Cx is neg

## 2019-08-13 NOTE — Telephone Encounter (Signed)
Called and given below message. She verbalized understanding. She said the vaginal bleeding stopped yesterday and thanks for everything.

## 2019-08-17 MED FILL — OXYBUTYNIN CHLORIDE 5 MG TA: 5 | 30 days supply | Qty: 90 | Fill #0

## 2019-08-18 ENCOUNTER — Telehealth: Payer: Self-pay | Admitting: Oncology

## 2019-08-18 ENCOUNTER — Other Ambulatory Visit: Payer: Self-pay | Admitting: Hematology and Oncology

## 2019-08-18 MED ORDER — OXYCODONE-ACETAMINOPHEN 5-325 MG PO TABS: 2.0000 | ORAL_TABLET | Freq: Four times a day (QID) | ORAL | 0 refills | Status: DC | PRN

## 2019-08-18 MED FILL — OXYCODONE-APAP 5-325MG: 5-325 | 7 days supply | Qty: 60 | Fill #0

## 2019-08-18 NOTE — Telephone Encounter (Signed)
Called Sheena Torres and let her know that the refill has been sent.

## 2019-08-18 NOTE — Telephone Encounter (Signed)
done

## 2019-08-18 NOTE — Telephone Encounter (Signed)
Sheena Torres called and requested a refill of Percocet.  She is going out of town until next Wednesday and will run out on Monday.  She uses the [facility].

## 2019-08-23 ENCOUNTER — Other Ambulatory Visit: Payer: Self-pay

## 2019-08-23 ENCOUNTER — Encounter (HOSPITAL_BASED_OUTPATIENT_CLINIC_OR_DEPARTMENT_OTHER): Payer: Self-pay | Admitting: Urology

## 2019-08-23 NOTE — Progress Notes (Signed)
Spoke w/ via phone for pre-op interview--- PT Lab needs dos---- [place]              Lab results------ CBCdiff/ CMP done 08-11-2019 results in epic COVID test ------ 08-28-2019 @ 1010 Arrive at ------- 0630 NPO after ------ MN w/ exception clear liquids until 0530 then nothing by mouth Medications to take morning of surgery ----- Flomax, Oxybutynin, and one Percocet (no extra tylenol) w/ sips of water Diabetic medication ----- n/a Patient Special Instructions ----- n/a Pre-Op special Istructions ----- n/a Patient verbalized understanding of instructions that were given at this phone interview. Patient denies shortness of breath, chest pain, fever, cough a this phone interview.   NEW Covid Policy July 1610  Surgery Day:  09-01-2019  Facility:  [facility]  Type of Surgery: Cyst w/ Bilateral Ureteral Stent Exchange  Fully Covid Vaccinated:   1)                                          2)                                          Where?                                           Type? Not Covid Vaccinated:  NO  Do you have symptoms? NO  In the past 14 days:        Have you had any symptoms? NO       Have you been tested covid positive? NO       Have you been in contact with someone covid positive? NO        Is pt Immuno-compromised? YES (currently on chemotherapy)

## 2019-08-27 ENCOUNTER — Other Ambulatory Visit: Payer: Self-pay

## 2019-08-27 ENCOUNTER — Inpatient Hospital Stay: Payer: Medicaid Other | Attending: Hematology and Oncology

## 2019-08-27 DIAGNOSIS — G893 Neoplasm related pain (acute) (chronic): Secondary | ICD-10-CM | POA: Insufficient documentation

## 2019-08-27 DIAGNOSIS — Z5111 Encounter for antineoplastic chemotherapy: Secondary | ICD-10-CM | POA: Diagnosis not present

## 2019-08-27 DIAGNOSIS — D61818 Other pancytopenia: Secondary | ICD-10-CM | POA: Diagnosis not present

## 2019-08-27 DIAGNOSIS — C530 Malignant neoplasm of endocervix: Secondary | ICD-10-CM

## 2019-08-27 DIAGNOSIS — N1330 Unspecified hydronephrosis: Secondary | ICD-10-CM | POA: Diagnosis not present

## 2019-08-27 DIAGNOSIS — N390 Urinary tract infection, site not specified: Secondary | ICD-10-CM | POA: Insufficient documentation

## 2019-08-27 LAB — URINALYSIS, COMPLETE (UACMP) WITH MICROSCOPIC
Bilirubin Urine: NEGATIVE
Glucose, UA: NEGATIVE mg/dL
Ketones, ur: NEGATIVE mg/dL
Nitrite: POSITIVE — AB
Protein, ur: 30 mg/dL — AB
Specific Gravity, Urine: 1.017 (ref 1.005–1.030)
WBC, UA: >50 WBC/hpf — ABNORMAL HIGH (ref 0–5)
pH: 6 (ref 5.0–8.0)

## 2019-08-27 NOTE — Progress Notes (Signed)
Pt filled out walk-in form today stating she has a possible UTI and is requesting a urine test. Pt states she feels fullness in her bladder and becomes swollen throughout the day. Pt is requesting an antibiotic "just in case". This LPN called Dr Alvy Bimler to advise. Alvy Bimler gave verbal order for UA/UC but no abx until results are back. Lab appt made for pt and reason for no abx was explained to pt; verbalized thanks and understanding.

## 2019-08-28 ENCOUNTER — Other Ambulatory Visit (HOSPITAL_COMMUNITY)
Admission: RE | Admit: 2019-08-28 | Discharge: 2019-08-28 | Disposition: A | Discharge status: Home / Self Care | Payer: Medicaid Other | Source: Ambulatory Visit | Attending: Urology | Admitting: Urology

## 2019-08-28 DIAGNOSIS — Z01812 Encounter for preprocedural laboratory examination: Secondary | ICD-10-CM | POA: Insufficient documentation

## 2019-08-28 DIAGNOSIS — Z20822 Contact with and (suspected) exposure to covid-19: Secondary | ICD-10-CM | POA: Diagnosis not present

## 2019-08-28 LAB — SARS CORONAVIRUS 2 (TAT 6-24 HRS): SARS Coronavirus 2: NEGATIVE

## 2019-08-29 LAB — URINE CULTURE: Culture: >=100000 — AB

## 2019-08-30 ENCOUNTER — Telehealth: Payer: Self-pay

## 2019-08-30 ENCOUNTER — Other Ambulatory Visit: Payer: Self-pay | Admitting: Hematology and Oncology

## 2019-08-30 DIAGNOSIS — N3001 Acute cystitis with hematuria: Secondary | ICD-10-CM

## 2019-08-30 DIAGNOSIS — C530 Malignant neoplasm of endocervix: Secondary | ICD-10-CM

## 2019-08-30 MED ORDER — CIPROFLOXACIN HCL 250 MG PO TABS
250.0000 mg | ORAL_TABLET | Freq: Two times a day (BID) | ORAL | 0 refills | Status: DC
Start: 2019-08-30 — End: 2019-10-07

## 2019-08-30 NOTE — Telephone Encounter (Signed)
Called and given below message. She verbalized understanding. She would like Rx sent to [facility] [place]. .

## 2019-08-30 NOTE — Telephone Encounter (Signed)
-----   Message from Heath Lark, MD sent at 08/30/2019  8:15 AM EDT ----- Regarding: urine Cx showed UTI, can you ask her preferred pharmacy?

## 2019-08-31 NOTE — Anesthesia Preprocedure Evaluation (Addendum)
Anesthesia Evaluation  Patient identified by MRN, date of birth, ID band Patient awake    Reviewed: Allergy & Precautions, NPO status , Patient's Chart, lab work & pertinent test results  History of Anesthesia Complications Negative for: history of anesthetic complications  Airway Mallampati: I  TM Distance: >3 FB Neck ROM: Full    Dental  (+) Dental Advisory Given, Teeth Intact   Pulmonary neg pulmonary ROS,  08/28/2019 SARS coronavirus NEG   breath sounds clear to auscultation       Cardiovascular negative cardio ROS   Rhythm:Regular Rate:Normal     Neuro/Psych  Headaches, PSYCHIATRIC DISORDERS (ADHD)    GI/Hepatic negative GI ROS, Neg liver ROS,   Endo/Other  negative endocrine ROS  Renal/GU Ureteral obstruction   Cervical cancer: tandem ring, chemo    Musculoskeletal   Abdominal   Peds  Hematology  (+) Blood dyscrasia (Hb 9.9), anemia ,   Anesthesia Other Findings   Reproductive/Obstetrics                            Anesthesia Physical Anesthesia Plan  ASA: II  Anesthesia Plan: General   Post-op Pain Management:    Induction: Intravenous  PONV Risk Score and Plan: 3 and Ondansetron, Dexamethasone and Treatment may vary due to age or medical condition  Airway Management Planned: LMA  Additional Equipment: None  Intra-op Plan:   Post-operative Plan:   Informed Consent: I have reviewed the patients History and Physical, chart, labs and discussed the procedure including the risks, benefits and alternatives for the proposed anesthesia with the patient or authorized representative who has indicated his/her understanding and acceptance.     Dental advisory given  Plan Discussed with: CRNA and Surgeon  Anesthesia Plan Comments:        Anesthesia Quick Evaluation

## 2019-09-01 ENCOUNTER — Other Ambulatory Visit: Payer: Self-pay

## 2019-09-01 ENCOUNTER — Ambulatory Visit (HOSPITAL_COMMUNITY)
Admission: RE | Admit: 2019-09-01 | Discharge: 2019-09-01 | Disposition: A | Discharge status: Home / Self Care | Payer: Medicaid Other | Attending: Urology | Admitting: Urology

## 2019-09-01 ENCOUNTER — Ambulatory Visit (HOSPITAL_BASED_OUTPATIENT_CLINIC_OR_DEPARTMENT_OTHER): Payer: Medicaid Other | Admitting: Anesthesiology

## 2019-09-01 ENCOUNTER — Encounter (HOSPITAL_BASED_OUTPATIENT_CLINIC_OR_DEPARTMENT_OTHER)
Admission: RE | Disposition: A | Discharge status: Home / Self Care | Payer: Self-pay | Source: Home / Self Care | Attending: Urology

## 2019-09-01 ENCOUNTER — Encounter (HOSPITAL_BASED_OUTPATIENT_CLINIC_OR_DEPARTMENT_OTHER): Payer: Self-pay | Admitting: Urology

## 2019-09-01 DIAGNOSIS — N390 Urinary tract infection, site not specified: Secondary | ICD-10-CM | POA: Insufficient documentation

## 2019-09-01 DIAGNOSIS — Z923 Personal history of irradiation: Secondary | ICD-10-CM | POA: Insufficient documentation

## 2019-09-01 DIAGNOSIS — C539 Malignant neoplasm of cervix uteri, unspecified: Secondary | ICD-10-CM | POA: Insufficient documentation

## 2019-09-01 DIAGNOSIS — Z9221 Personal history of antineoplastic chemotherapy: Secondary | ICD-10-CM | POA: Insufficient documentation

## 2019-09-01 DIAGNOSIS — B957 Other staphylococcus as the cause of diseases classified elsewhere: Secondary | ICD-10-CM | POA: Insufficient documentation

## 2019-09-01 DIAGNOSIS — D630 Anemia in neoplastic disease: Secondary | ICD-10-CM | POA: Diagnosis not present

## 2019-09-01 DIAGNOSIS — N131 Hydronephrosis with ureteral stricture, not elsewhere classified: Secondary | ICD-10-CM | POA: Diagnosis not present

## 2019-09-01 DIAGNOSIS — Z466 Encounter for fitting and adjustment of urinary device: Secondary | ICD-10-CM | POA: Insufficient documentation

## 2019-09-01 HISTORY — DX: Hematuria, unspecified: R31.9

## 2019-09-01 HISTORY — DX: Anemia, unspecified: D64.9

## 2019-09-01 HISTORY — DX: Urgency of urination: R39.15

## 2019-09-01 HISTORY — DX: Other chronic pain: G89.29

## 2019-09-01 HISTORY — DX: Nocturia: R35.1

## 2019-09-01 HISTORY — PX: CYSTOSCOPY WITH STENT PLACEMENT: SHX5790

## 2019-09-01 HISTORY — DX: Other chronic cystitis without hematuria: N30.20

## 2019-09-01 HISTORY — DX: Localized edema: R60.0

## 2019-09-01 HISTORY — DX: Crossing vessel and stricture of ureter without hydronephrosis: N13.5

## 2019-09-01 LAB — POCT I-STAT, CHEM 8
BUN: 17 mg/dL (ref 6–20)
Calcium, Ion: 1.3 mmol/L (ref 1.15–1.40)
Chloride: 97 mmol/L — ABNORMAL LOW (ref 98–111)
Creatinine, Ser: 1.1 mg/dL — ABNORMAL HIGH (ref 0.44–1.00)
Glucose, Bld: 98 mg/dL (ref 70–99)
HCT: 29 % — ABNORMAL LOW (ref 36.0–46.0)
Hemoglobin: 9.9 g/dL — ABNORMAL LOW (ref 12.0–15.0)
Potassium: 3.9 mmol/L (ref 3.5–5.1)
Sodium: 137 mmol/L (ref 135–145)
TCO2: 25 mmol/L (ref 22–32)

## 2019-09-01 SURGERY — CYSTOSCOPY, WITH STENT INSERTION
Anesthesia: General | Site: Ureter | Laterality: Bilateral

## 2019-09-01 MED ORDER — MIDAZOLAM HCL 2 MG/2ML IJ SOLN: 0.5000 mg | Freq: Once | INTRAMUSCULAR | Status: DC | PRN

## 2019-09-01 MED ORDER — OXYBUTYNIN CHLORIDE ER 10 MG PO TB24
10.0000 mg | ORAL_TABLET | Freq: Once | ORAL | Status: AC
  Administered 2019-09-01: 10 mg via ORAL

## 2019-09-01 MED ORDER — LIDOCAINE 2% (20 MG/ML) 5 ML SYRINGE
INTRAMUSCULAR | Status: AC
  Filled 2019-09-01: qty 5

## 2019-09-01 MED ORDER — LIDOCAINE 2% (20 MG/ML) 5 ML SYRINGE
INTRAMUSCULAR | Status: DC | PRN
  Administered 2019-09-01: 50 mg via INTRAVENOUS

## 2019-09-01 MED ORDER — EPHEDRINE 5 MG/ML INJ
INTRAVENOUS | Status: AC
  Filled 2019-09-01: qty 10

## 2019-09-01 MED ORDER — LACTATED RINGERS IV SOLN: INTRAVENOUS | Status: DC

## 2019-09-01 MED ORDER — PROPOFOL 10 MG/ML IV BOLUS
INTRAVENOUS | Status: AC
  Filled 2019-09-01: qty 40

## 2019-09-01 MED ORDER — FENTANYL CITRATE (PF) 100 MCG/2ML IJ SOLN
INTRAMUSCULAR | Status: DC | PRN
  Administered 2019-09-01: 25 ug via INTRAVENOUS
  Administered 2019-09-01: 50 ug via INTRAVENOUS
  Administered 2019-09-01: 25 ug via INTRAVENOUS

## 2019-09-01 MED ORDER — ONDANSETRON HCL 4 MG/2ML IJ SOLN
INTRAMUSCULAR | Status: AC
  Filled 2019-09-01: qty 2

## 2019-09-01 MED ORDER — SCOPOLAMINE 1 MG/3DAYS TD PT72
1.0000 | MEDICATED_PATCH | TRANSDERMAL | Status: DC
  Administered 2019-09-01: 1.5 mg via TRANSDERMAL

## 2019-09-01 MED ORDER — IOHEXOL 300 MG/ML  SOLN
INTRAMUSCULAR | Status: DC | PRN
  Administered 2019-09-01: 10 mL via URETHRAL

## 2019-09-01 MED ORDER — FENTANYL CITRATE (PF) 100 MCG/2ML IJ SOLN
INTRAMUSCULAR | Status: AC
  Filled 2019-09-01: qty 2

## 2019-09-01 MED ORDER — MEPERIDINE HCL 25 MG/ML IJ SOLN: 6.2500 mg | INTRAMUSCULAR | Status: DC | PRN

## 2019-09-01 MED ORDER — OXYCODONE HCL 5 MG PO TABS
5.0000 mg | ORAL_TABLET | Freq: Once | ORAL | Status: AC | PRN
  Administered 2019-09-01: 5 mg via ORAL

## 2019-09-01 MED ORDER — PHENAZOPYRIDINE HCL 200 MG PO TABS
200.0000 mg | ORAL_TABLET | Freq: Three times a day (TID) | ORAL | 0 refills | Status: DC | PRN
Start: 2019-09-01 — End: 2019-10-07

## 2019-09-01 MED ORDER — SCOPOLAMINE 1 MG/3DAYS TD PT72
MEDICATED_PATCH | TRANSDERMAL | Status: AC
  Filled 2019-09-01: qty 1

## 2019-09-01 MED ORDER — SODIUM CHLORIDE 0.9 % IR SOLN
Status: DC | PRN
  Administered 2019-09-01: 3000 mL via INTRAVESICAL

## 2019-09-01 MED ORDER — CEFAZOLIN SODIUM-DEXTROSE 2-4 GM/100ML-% IV SOLN
2.0000 g | Freq: Once | INTRAVENOUS | Status: AC
  Administered 2019-09-01: 2 g via INTRAVENOUS

## 2019-09-01 MED ORDER — PROPOFOL 10 MG/ML IV BOLUS
INTRAVENOUS | Status: DC | PRN
  Administered 2019-09-01: 100 mg via INTRAVENOUS

## 2019-09-01 MED ORDER — DEXAMETHASONE SODIUM PHOSPHATE 10 MG/ML IJ SOLN
INTRAMUSCULAR | Status: DC | PRN
  Administered 2019-09-01: 10 mg via INTRAVENOUS

## 2019-09-01 MED ORDER — MIDAZOLAM HCL 5 MG/5ML IJ SOLN
INTRAMUSCULAR | Status: DC | PRN
  Administered 2019-09-01: 2 mg via INTRAVENOUS

## 2019-09-01 MED ORDER — OXYCODONE HCL 5 MG PO TABS
ORAL_TABLET | ORAL | Status: AC
  Filled 2019-09-01: qty 1

## 2019-09-01 MED ORDER — OXYBUTYNIN CHLORIDE ER 10 MG PO TB24
ORAL_TABLET | ORAL | Status: AC
  Filled 2019-09-01: qty 1

## 2019-09-01 MED ORDER — FENTANYL CITRATE (PF) 100 MCG/2ML IJ SOLN
25.0000 ug | INTRAMUSCULAR | Status: DC | PRN
  Administered 2019-09-01: 50 ug via INTRAVENOUS

## 2019-09-01 MED ORDER — MIDAZOLAM HCL 2 MG/2ML IJ SOLN
INTRAMUSCULAR | Status: AC
  Filled 2019-09-01: qty 2

## 2019-09-01 MED ORDER — PROMETHAZINE HCL 25 MG/ML IJ SOLN: 6.2500 mg | INTRAMUSCULAR | Status: DC | PRN

## 2019-09-01 MED ORDER — DEXAMETHASONE SODIUM PHOSPHATE 10 MG/ML IJ SOLN
INTRAMUSCULAR | Status: AC
  Filled 2019-09-01: qty 1

## 2019-09-01 MED ORDER — CEFAZOLIN SODIUM-DEXTROSE 2-4 GM/100ML-% IV SOLN
INTRAVENOUS | Status: AC
  Filled 2019-09-01: qty 100

## 2019-09-01 MED ORDER — OXYCODONE HCL 5 MG/5ML PO SOLN: 5.0000 mg | Freq: Once | ORAL | Status: AC | PRN

## 2019-09-01 MED FILL — PHENAZOPYRIDINE 200 MG TAB: 200 | 10 days supply | Qty: 30 | Fill #0

## 2019-09-01 SURGICAL SUPPLY — 14 items
BAG URO CATCHER STRL LF (MISCELLANEOUS) ×3 IMPLANT
CATH URET 5FR 28IN OPEN ENDED (CATHETERS) ×3 IMPLANT
CLOTH BEACON ORANGE TIMEOUT ST (SAFETY) ×3 IMPLANT
GLOVE BIOGEL M STRL SZ7.5 (GLOVE) ×3 IMPLANT
GOWN STRL REUS W/TWL XL LVL3 (GOWN DISPOSABLE) ×3 IMPLANT
GUIDEWIRE STR DUAL SENSOR (WIRE) IMPLANT
GUIDEWIRE ZIPWRE .038 STRAIGHT (WIRE) ×3 IMPLANT
IV NS IRRIG 3000ML ARTHROMATIC (IV SOLUTION) ×3 IMPLANT
KIT TURNOVER CYSTO (KITS) IMPLANT
MANIFOLD NEPTUNE II (INSTRUMENTS) ×3 IMPLANT
PACK CYSTO (CUSTOM PROCEDURE TRAY) ×3 IMPLANT
STENT POLARIS LOOP 6FR X 24 CM (STENTS) ×6 IMPLANT
TUBING CONNECTING 10 (TUBING) ×2 IMPLANT
TUBING CONNECTING 10' (TUBING) ×1

## 2019-09-01 NOTE — Anesthesia Postprocedure Evaluation (Signed)
Anesthesia Post Note  Patient: Sheena Torres  Procedure(s) Performed: CYSTOSCOPY WITH STENT EXCHANGE/ RETROGRADE (Bilateral Ureter)     Patient location during evaluation: PACU Anesthesia Type: General Level of consciousness: awake and alert, oriented and patient cooperative Pain management: pain level controlled Vital Signs Assessment: post-procedure vital signs reviewed and stable Respiratory status: spontaneous breathing, nonlabored ventilation and respiratory function stable Cardiovascular status: blood pressure returned to baseline and stable Postop Assessment: no apparent nausea or vomiting Anesthetic complications: no   No complications documented.  Last Vitals:  Vitals:   09/01/19 0638 09/01/19 0900  BP: 91/69 100/67  Pulse: 97 92  Resp: 14 10  Temp: 36.7 C 36.6 C  SpO2: 99% 100%    Last Pain:  Vitals:   09/01/19 0900  TempSrc:   PainSc: Asleep                 Graydon Fofana,E. Issaac Shipper

## 2019-09-01 NOTE — Anesthesia Procedure Notes (Signed)
Procedure Name: LMA Insertion Date/Time: 09/01/2019 8:28 AM Performed by: Bonney Aid, CRNA Pre-anesthesia Checklist: Patient identified, Emergency Drugs available, Suction available and Patient being monitored Patient Re-evaluated:Patient Re-evaluated prior to induction Oxygen Delivery Method: Circle system utilized Preoxygenation: Pre-oxygenation with 100% oxygen Induction Type: IV induction Ventilation: Mask ventilation without difficulty LMA: LMA inserted LMA Size: 4.0 Number of attempts: 1 Airway Equipment and Method: Bite block Placement Confirmation: positive ETCO2 Tube secured with: Tape Dental Injury: Teeth and Oropharynx as per pre-operative assessment

## 2019-09-01 NOTE — Op Note (Signed)
Operative Note  Preoperative diagnosis:  1.  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  Same  Procedure(s): 1.  Cystoscopy with bilateral stent exchange 2.  Bilateral retrograde pyelograms with intraoperative interpretation of fluoroscopic imaging  Surgeon: Ellison Hughs, MD  Assistants:  None  Anesthesia:  General  Complications:  None  EBL: Less than 5 mL  Specimens: 1.  Previously placed bilateral JJ stents were removed intact, inspected and discarded  Drains/Catheters: 1.  Bilateral 6 French, 24 cm Polaris stents without tethers  Intraoperative findings:   1. Right retrograde pyelogram revealed dilation starting in the mid to proximal aspects of the right ureter, extending up to the right renal pelvis.  No distal filling defects were identified. 2. Left retrograde pyelogram revealed dilation starting in the mid to proximal aspects of the left ureter, extending up to the left renal pelvis.  No distal filling defects were identified.  Indication:  Sheena Torres is a 53 y.o. female with stage IV cervical cancer with concomitant bilateral ureteral obstruction requiring indwelling ureteral stents.  She was recently diagnosed with a staph epididymitis UTI and is currently receiving treatment via Cipro.  The patient has been consented for the above procedures, voices understanding and wishes to proceed.  Description of procedure:  After informed consent was obtained, the patient was brought to the operating room and general LMA anesthesia was administered. The patient was then placed in the dorsolithotomy position and prepped and draped in the usual sterile fashion. A timeout was performed. A 23 French rigid cystoscope was then inserted into the urethral meatus and advanced into the bladder under direct vision. A complete bladder survey revealed no intravesical pathology.  Her previously placed right JJ stent was then  grasped at its distal curl and removed intact.  A 5 French ureteral catheter was then inserted into the right ureteral orifice and a retrograde pyelogram was obtained, with the findings listed above.  A Glidewire was then used to intubate the lumen of the ureteral catheter and was advanced up to the right renal pelvis, under fluoroscopic guidance.  The catheter was then removed, leaving the wire in place.  A 6 French, 24 cm Polaris stent was then advanced over the wire and into good position within the right collecting system, confirming placement via fluoroscopy.  A 5 French ureteral catheter was then inserted into the left ureteral orifice and a retrograde pyelogram was obtained, with the findings listed above.  A Glidewire was then used to intubate the lumen of the ureteral catheter and was advanced up to the left renal pelvis, under fluoroscopic guidance.  The catheter was then removed, leaving the wire in place.  A 6 French, 24 cm Polaris stent was then advanced over the wire and into good position within the left collecting system, confirming placement via fluoroscopy.  The patient's bladder was then drained.  She tolerated the procedure well and was transferred to the postanesthesia in stable condition.  Plan: Follow-up in 2.5 months to schedule her next stent exchange

## 2019-09-01 NOTE — Discharge Instructions (Signed)
Ureteral Stent Implantation, Care After This sheet gives you information about how to care for yourself after your procedure. Your health care provider may also give you more specific instructions. If you have problems or questions, contact your health care provider. What can I expect after the procedure? After the procedure, it is common to have:  Nausea.  Mild pain when you urinate. You may feel this pain in your lower back or lower abdomen. The pain should stop within a few minutes after you urinate. This may last for up to 1 week.  A small amount of blood in your urine for several days. Follow these instructions at home: Medicines  Take over-the-counter and prescription medicines only as told by your health care provider.  If you were prescribed an antibiotic medicine, take it as told by your health care provider. Do not stop taking the antibiotic even if you start to feel better.  Do not drive for 24 hours if you were given a sedative during your procedure.  Ask your health care provider if the medicine prescribed to you requires you to avoid driving or using heavy machinery. Activity  Rest as told by your health care provider.  Avoid sitting for a long time without moving. Get up to take short walks every 1-2 hours. This is important to improve blood flow and breathing. Ask for help if you feel weak or unsteady.  Return to your normal activities as told by your health care provider. Ask your health care provider what activities are safe for you. General instructions   Watch for any blood in your urine. Call your health care provider if the amount of blood in your urine increases.  If you have a catheter: ? Follow instructions from your health care provider about taking care of your catheter and collection bag. ? Do not take baths, swim, or use a hot tub until your health care provider approves. Ask your health care provider if you may take showers. You may only be allowed to  take sponge baths.  Drink enough fluid to keep your urine pale yellow.  Do not use any products that contain nicotine or tobacco, such as cigarettes, e-cigarettes, and chewing tobacco. These can delay healing after surgery. If you need help quitting, ask your health care provider.  Keep all follow-up visits as told by your health care provider. This is important. Contact a health care provider if:  You have pain that gets worse or does not get better with medicine, especially pain when you urinate.  You have difficulty urinating.  You feel nauseous or you vomit repeatedly during a period of more than 2 days after the procedure. Get help right away if:  Your urine is dark red or has blood clots in it.  You are leaking urine (have incontinence).  The end of the stent comes out of your urethra.  You cannot urinate.  You have sudden, sharp, or severe pain in your abdomen or lower back.  You have a fever.  You have swelling or pain in your legs.  You have difficulty breathing. Summary  After the procedure, it is common to have mild pain when you urinate that goes away within a few minutes after you urinate. This may last for up to 1 week.  Watch for any blood in your urine. Call your health care provider if the amount of blood in your urine increases.  Take over-the-counter and prescription medicines only as told by your health care provider.  Drink   enough fluid to keep your urine pale yellow. This information is not intended to replace advice given to you by your health care provider. Make sure you discuss any questions you have with your health care provider. Document Revised: 11/04/2017 Document Reviewed: 11/05/2017 Elsevier Patient Education  2020 [employer]   Post Anesthesia Home Care Instructions  Activity: Get plenty of rest for the remainder of the day. A responsible individual must stay with you for 24 hours following the procedure.  For the next 24 hours, DO  NOT: -Drive a car -Operate machinery -Drink alcoholic beverages -Take any medication unless instructed by your physician -Make any legal decisions or sign important papers.  Meals: Start with liquid foods such as gelatin or soup. Progress to regular foods as tolerated. Avoid greasy, spicy, heavy foods. If nausea and/or vomiting occur, drink only clear liquids until the nausea and/or vomiting subsides. Call your physician if vomiting continues.  Special Instructions/Symptoms: Your throat may feel dry or sore from the anesthesia or the breathing tube placed in your throat during surgery. If this causes discomfort, gargle with warm salt water. The discomfort should disappear within 24 hours.  If you had a scopolamine patch placed behind your ear for the management of post- operative nausea and/or vomiting:  1. The medication in the patch is effective for 72 hours, after which it should be removed.  Wrap patch in a tissue and discard in the trash. Wash hands thoroughly with soap and water. 2. You may remove the patch earlier than 72 hours if you experience unpleasant side effects which may include dry mouth, dizziness or visual disturbances. 3. Avoid touching the patch. Wash your hands with soap and water after contact with the patch. 

## 2019-09-01 NOTE — Transfer of Care (Signed)
Immediate Anesthesia Transfer of Care Note  Patient: Sheena Torres  Procedure(s) Performed: CYSTOSCOPY WITH STENT EXCHANGE/ RETROGRADE (Bilateral Ureter)  Patient Location: PACU  Anesthesia Type:General  Level of Consciousness: awake, alert  and oriented  Airway & Oxygen Therapy: Patient Spontanous Breathing and Patient connected to nasal cannula oxygen  Post-op Assessment: Report given to RN  Post vital signs: Reviewed and stable  Last Vitals: 101/57, 88, 12, 100% Vitals Value Taken Time  BP    Temp    Pulse    Resp 10 09/01/19 0859  SpO2    Vitals shown include unvalidated device data.  Last Pain:  Vitals:   09/01/19 0929  TempSrc: Oral         Complications: No complications documented.

## 2019-09-01 NOTE — H&P (Signed)
Urology Preoperative H&P   Chief Complaint: Bilateral hydronephrosis  History of Present Illness: Sheena Torres is a 54 y.o. female with recurrent cervical cancer causing concomitant bilateral hydronephrosis requiring indwelling ureteral stents.  The patient was recently found to have a staph epi UTI and is currently being treated with cipro.  She denies flank pain, nausea or fever/chills.     Past Medical History:  Diagnosis Date  . ADHD   . Anemia   . Bilateral edema of lower extremity    08-23-2019  per pt wears compression hose  . Bilateral ureteral obstruction    urologist--- dr Alric Geise  . Cervical cancer [facility]) oncologist-- dr gorsuch/  dr Sondra Come   Stage IIB, SCC--- chemo 05-14-2018 to 06-26-2018 and started chemo 05-28-2019 weekly;  completed IMRT w/ last 5 at high dose 05-14-2018  to 07-27-2018  . Chemotherapy-induced nausea    08-23-2019  per pt intermittant  . Chronic cystitis   . Chronic pain    due to chemo  . Fatigue   . Hematuria    08-23-2019  per pt intermittant  . IBS (irritable bowel syndrome)    w/ diarrhea  . Migraine   . Nocturia more than twice per night   . Pancytopenia, acquired ([place])   . Port-A-Cath in place 05/08/2018  . Sensation of pressure in bladder area    occasional due to radiation therapy  . Urgency of urination    08-23-2019 mostly during the day    Past Surgical History:  Procedure Laterality Date  . BREAST ENHANCEMENT SURGERY Bilateral 2017   w/ implants  . CESAREAN SECTION  2000  . CYSTOSCOPY WITH STENT PLACEMENT Bilateral 05/26/2019   Procedure: CYSTOSCOPY WITH BILATERAL  STENT PLACEMENT;  Surgeon: Ceasar Mons, MD;  Location: [facility];  Service: Urology;  Laterality: Bilateral;  . IR IMAGING GUIDED PORT INSERTION  05/08/2018  . IR REMOVAL TUN ACCESS W/ PORT W/O FL MOD SED  11/16/2018  . OPERATIVE ULTRASOUND N/A 07/02/2018   Procedure: OPERATIVE ULTRASOUND;  Surgeon: Gery Pray, MD;  Location[facility];   Service: Urology;  Laterality: N/A;  . OPERATIVE ULTRASOUND N/A 07/09/2018   Procedure: OPERATIVE ULTRASOUND;  Surgeon: Gery Pray, MD;  Location: [facility];  Service: Urology;  Laterality: N/A;  . OPERATIVE ULTRASOUND N/A 07/13/2018   Procedure: OPERATIVE ULTRASOUND;  Surgeon: Gery Pray, MD;  Location: [facility];  Service: Urology;  Laterality: N/A;  . OPERATIVE ULTRASOUND N/A 07/21/2018   Procedure: OPERATIVE ULTRASOUND;  Surgeon: Gery Pray, MD;  Location: [facility];  Service: Urology;  Laterality: N/A;  . OPERATIVE ULTRASOUND N/A 07/27/2018   Procedure: OPERATIVE ULTRASOUND;  Surgeon: Gery Pray, MD;  Location: [facility];  Service: Urology;  Laterality: N/A;  . TANDEM RING INSERTION N/A 07/02/2018   Procedure: TANDEM RING INSERTION;  Surgeon: Gery Pray, MD;  Location: [facility];  Service: Urology;  Laterality: N/A;  . TANDEM RING INSERTION N/A 07/09/2018   Procedure: TANDEM RING INSERTION;  Surgeon: Gery Pray, MD;  Location: [facility];  Service: Urology;  Laterality: N/A;  . TANDEM RING INSERTION N/A 07/13/2018   Procedure: TANDEM RING INSERTION;  Surgeon: Gery Pray, MD;  Location: [facility];  Service: Urology;  Laterality: N/A;  . TANDEM RING INSERTION N/A 07/21/2018   Procedure: TANDEM RING INSERTION;  Surgeon: Gery Pray, MD;  Location: [facility];  Service: Urology;  Laterality: N/A;  . TANDEM RING  INSERTION N/A 07/27/2018   Procedure: TANDEM RING INSERTION;  Surgeon: Gery Pray, MD;  Location: [facility];  Service: Urology;  Laterality: N/A;    Allergies: Not on File  Family History  Problem Relation Age of Onset  . Heart disease Mother   . Diabetes Father   . Heart disease Father   . Cancer Maternal Grandmother        mouth/jaw cancer    Social History:  reports that she has never smoked. She has never used  smokeless tobacco. She reports previous alcohol use. She reports that she does not use drugs.  ROS: A complete review of systems was performed.  All systems are negative except for pertinent findings as noted.  Physical Exam:  Vital signs in last 24 hours: Temp:  [98.1 F (36.7 C)] 98.1 F (36.7 C) (07/21 9242) Pulse Rate:  [97] 97 (07/21 0638) Resp:  [14] 14 (07/21 6834) BP: (91)/(69) 91/69 (07/21 1962) SpO2:  [99 %] 99 % (07/21 2297) Weight:  [66.9 kg] 66.9 kg (07/21 9892) Constitutional:  Alert and oriented, No acute distress Cardiovascular: Regular rate and rhythm, No JVD Respiratory: Normal respiratory effort, Lungs clear bilaterally GI: Abdomen is soft, nontender, nondistended, no abdominal masses GU: No CVA tenderness Lymphatic: No lymphadenopathy Neurologic: Grossly intact, no focal deficits Psychiatric: Normal mood and affect  Laboratory Data:  Recent Labs    09/01/19 0722  HGB 9.9*  HCT 29.0*    Recent Labs    09/01/19 0722  NA 137  K 3.9  CL 97*  GLUCOSE 98  BUN 17  CREATININE 1.10*     Results for orders placed or performed during the hospital encounter of 09/01/19 (from the past 24 hour(s))  I-STAT, chem 8     Status: Abnormal   Collection Time: 09/01/19  7:22 AM  Result Value Ref Range   Sodium 137 135 - 145 mmol/L   Potassium 3.9 3.5 - 5.1 mmol/L   Chloride 97 (L) 98 - 111 mmol/L   BUN 17 6 - 20 mg/dL   Creatinine, Ser 1.10 (H) 0.44 - 1.00 mg/dL   Glucose, Bld 98 70 - 99 mg/dL   Calcium, Ion 1.30 1.15 - 1.40 mmol/L   TCO2 25 22 - 32 mmol/L   Hemoglobin 9.9 (L) 12.0 - 15.0 g/dL   HCT 29.0 (L) 36 - 46 %   Recent Results (from the past 240 hour(s))  Urine Culture     Status: Abnormal   Collection Time: 08/27/19 11:24 AM   Specimen: Urine, Clean Catch  Result Value Ref Range Status   Specimen Description   Final    URINE, CLEAN CATCH Performed at [facility], [address] [place], [place] [address]    Special  Requests   Final    NONE Performed at [facility], [place] [address], [place], [place] [address]    Culture >=100,000 COLONIES/mL STAPHYLOCOCCUS EPIDERMIDIS (A)  Final   Report Status 08/29/2019 FINAL  Final   Organism ID, Bacteria STAPHYLOCOCCUS EPIDERMIDIS (A)  Final      Susceptibility   Staphylococcus epidermidis - MIC*    CIPROFLOXACIN <=0.5 SENSITIVE Sensitive     GENTAMICIN <=0.5 SENSITIVE Sensitive     NITROFURANTOIN <=16 SENSITIVE Sensitive     OXACILLIN >=4 RESISTANT Resistant     TETRACYCLINE <=1 SENSITIVE Sensitive     VANCOMYCIN 1 SENSITIVE Sensitive     TRIMETH/SULFA <=10 SENSITIVE Sensitive     CLINDAMYCIN <=0.25 SENSITIVE  Sensitive     RIFAMPIN <=0.5 SENSITIVE Sensitive     Inducible Clindamycin NEGATIVE Sensitive     * >=100,000 COLONIES/mL STAPHYLOCOCCUS EPIDERMIDIS  SARS CORONAVIRUS 2 (TAT 6-24 HRS) Nasopharyngeal Nasopharyngeal Swab     Status: None   Collection Time: 08/28/19 10:33 AM   Specimen: Nasopharyngeal Swab  Result Value Ref Range Status   SARS Coronavirus 2 NEGATIVE NEGATIVE Final    Comment: (NOTE) SARS-CoV-2 target nucleic acids are NOT DETECTED.  The SARS-CoV-2 RNA is generally detectable in upper and lower respiratory specimens during the acute phase of infection. Negative results do not preclude SARS-CoV-2 infection, do not rule out co-infections with other pathogens, and should not be used as the sole basis for treatment or other patient management decisions. Negative results must be combined with clinical observations, patient history, and epidemiological information. The expected result is Negative.  Fact Sheet for Patients: SugarRoll.be  Fact Sheet for Healthcare Providers: https://www.woods-mathews.com/  This test is not yet approved or cleared by the [place] FDA and  has been authorized for detection and/or diagnosis of SARS-CoV-2 by FDA under an Emergency Use  Authorization (EUA). This EUA will remain  in effect (meaning this test can be used) for the duration of the COVID-19 declaration under Se ction 564(b)(1) of the Act, 21 U.S.C. section 360bbb-3(b)(1), unless the authorization is terminated or revoked sooner.  Performed at [facility], [place] [address], [place], [place] [address]     Renal Function: Recent Labs    09/01/19 7616  CREATININE 1.10*   Estimated Creatinine Clearance: 52.4 mL/min (A) (by C-G formula based on SCr of 1.1 mg/dL (H)).  Radiologic Imaging: No results found.  I independently reviewed the above imaging studies.  Assessment and Plan TWILIA YAKLIN is a 54 y.o. female with recurrent cervical cancer causing bilateral ureteral obstruction requiring indwelling ureteral stents  -The risks, benefits and alternatives of cystoscopy with BILATERAL JJ stent placement was discussed with the patient.  Risks include, but are not limited to: bleeding, urinary tract infection, ureteral injury, ureteral stricture disease, chronic pain, urinary symptoms, bladder injury, stent migration, the need for nephrostomy tube placement, MI, CVA, DVT, PE and the inherent risks with general anesthesia.  The patient voices understanding and wishes to proceed.   Ellison Hughs, MD 09/01/2019, 8:08 AM  [facility] Pager: [phone]

## 2019-09-02 ENCOUNTER — Inpatient Hospital Stay: Payer: Medicaid Other

## 2019-09-02 ENCOUNTER — Other Ambulatory Visit: Payer: Self-pay

## 2019-09-02 ENCOUNTER — Inpatient Hospital Stay (HOSPITAL_BASED_OUTPATIENT_CLINIC_OR_DEPARTMENT_OTHER): Payer: Medicaid Other | Admitting: Hematology and Oncology

## 2019-09-02 ENCOUNTER — Encounter (HOSPITAL_BASED_OUTPATIENT_CLINIC_OR_DEPARTMENT_OTHER): Payer: Self-pay | Admitting: Urology

## 2019-09-02 VITALS — BP 104/58 | HR 75 | Temp 98.0°F | Resp 20 | Ht 62.0 in | Wt 149.6 lb

## 2019-09-02 DIAGNOSIS — T451X5A Adverse effect of antineoplastic and immunosuppressive drugs, initial encounter: Secondary | ICD-10-CM

## 2019-09-02 DIAGNOSIS — N3001 Acute cystitis with hematuria: Secondary | ICD-10-CM | POA: Diagnosis not present

## 2019-09-02 DIAGNOSIS — C530 Malignant neoplasm of endocervix: Secondary | ICD-10-CM | POA: Diagnosis not present

## 2019-09-02 DIAGNOSIS — N1330 Unspecified hydronephrosis: Secondary | ICD-10-CM

## 2019-09-02 DIAGNOSIS — D61818 Other pancytopenia: Secondary | ICD-10-CM

## 2019-09-02 DIAGNOSIS — D6481 Anemia due to antineoplastic chemotherapy: Secondary | ICD-10-CM

## 2019-09-02 DIAGNOSIS — Z5111 Encounter for antineoplastic chemotherapy: Secondary | ICD-10-CM | POA: Diagnosis not present

## 2019-09-02 DIAGNOSIS — G893 Neoplasm related pain (acute) (chronic): Secondary | ICD-10-CM

## 2019-09-02 LAB — CBC WITH DIFFERENTIAL (CANCER CENTER ONLY)
Abs Immature Granulocytes: 0.02 10*3/uL (ref 0.00–0.07)
Basophils Absolute: 0 10*3/uL (ref 0.0–0.1)
Basophils Relative: 0 %
Eosinophils Absolute: 0 10*3/uL (ref 0.0–0.5)
Eosinophils Relative: 0 %
HCT: 20.9 % — ABNORMAL LOW (ref 36.0–46.0)
Hemoglobin: 7 g/dL — ABNORMAL LOW (ref 12.0–15.0)
Immature Granulocytes: 1 %
Lymphocytes Relative: 11 %
Lymphs Abs: 0.4 10*3/uL — ABNORMAL LOW (ref 0.7–4.0)
MCH: 32.7 pg (ref 26.0–34.0)
MCHC: 33.5 g/dL (ref 30.0–36.0)
MCV: 97.7 fL (ref 80.0–100.0)
Monocytes Absolute: 0.2 10*3/uL (ref 0.1–1.0)
Monocytes Relative: 6 %
Neutro Abs: 2.8 10*3/uL (ref 1.7–7.7)
Neutrophils Relative %: 82 %
Platelet Count: 71 10*3/uL — ABNORMAL LOW (ref 150–400)
RBC: 2.14 MIL/uL — ABNORMAL LOW (ref 3.87–5.11)
RDW: 17.6 % — ABNORMAL HIGH (ref 11.5–15.5)
WBC Count: 3.4 10*3/uL — ABNORMAL LOW (ref 4.0–10.5)
nRBC: 0 % (ref 0.0–0.2)

## 2019-09-02 LAB — CMP (CANCER CENTER ONLY)
ALT: 11 U/L (ref 0–44)
AST: 16 U/L (ref 15–41)
Albumin: 3.3 g/dL — ABNORMAL LOW (ref 3.5–5.0)
Alkaline Phosphatase: 80 U/L (ref 38–126)
Anion gap: 13 (ref 5–15)
BUN: 21 mg/dL — ABNORMAL HIGH (ref 6–20)
CO2: 22 mmol/L (ref 22–32)
Calcium: 9.9 mg/dL (ref 8.9–10.3)
Chloride: 104 mmol/L (ref 98–111)
Creatinine: 1.05 mg/dL — ABNORMAL HIGH (ref 0.44–1.00)
GFR, Est AFR Am: >60 mL/min (ref >60)
GFR, Estimated: >60 mL/min (ref >60)
Glucose, Bld: 110 mg/dL — ABNORMAL HIGH (ref 70–99)
Potassium: 4.2 mmol/L (ref 3.5–5.1)
Sodium: 139 mmol/L (ref 135–145)
Total Bilirubin: 0.4 mg/dL (ref 0.3–1.2)
Total Protein: 6.3 g/dL — ABNORMAL LOW (ref 6.5–8.1)

## 2019-09-02 LAB — SAMPLE TO BLOOD BANK

## 2019-09-02 LAB — PREPARE RBC (CROSSMATCH)

## 2019-09-02 MED ORDER — METHADONE HCL 10 MG PO TABS: 10.0000 mg | ORAL_TABLET | Freq: Two times a day (BID) | ORAL | 0 refills | Status: DC

## 2019-09-02 MED ORDER — SODIUM CHLORIDE 0.9% IV SOLUTION
250.0000 mL | Freq: Once | INTRAVENOUS | Status: DC
  Filled 2019-09-02: qty 250

## 2019-09-02 MED ORDER — ACETAMINOPHEN 325 MG PO TABS
ORAL_TABLET | ORAL | Status: AC
  Filled 2019-09-02: qty 1

## 2019-09-02 MED ORDER — ACETAMINOPHEN 325 MG PO TABS
650.0000 mg | ORAL_TABLET | Freq: Once | ORAL | Status: AC
  Administered 2019-09-02: 650 mg via ORAL

## 2019-09-02 MED ORDER — DIPHENHYDRAMINE HCL 25 MG PO CAPS
25.0000 mg | ORAL_CAPSULE | Freq: Once | ORAL | Status: AC
  Administered 2019-09-02: 25 mg via ORAL

## 2019-09-02 MED ORDER — DIPHENHYDRAMINE HCL 25 MG PO CAPS
ORAL_CAPSULE | ORAL | Status: AC
  Filled 2019-09-02: qty 1

## 2019-09-02 MED FILL — METHADONE HCL 10 MG TABLET: 10 | 15 days supply | Qty: 30 | Fill #0

## 2019-09-02 NOTE — Progress Notes (Signed)
Reported HGB of 7 to Dr. Alvy Bimler.

## 2019-09-02 NOTE — Assessment & Plan Note (Signed)
She has profound pancytopenia despite multiple dose adjustment We will delay her chemotherapy by 1 week and then turn every cycle to 28 days The patient has intermittent hematuria but I do not believe this is the cause of her profound, severe anemia We discussed some of the risks, benefits, and alternatives of blood transfusions. The patient is symptomatic from anemia and the hemoglobin level is critically low.  Some of the side-effects to be expected including risks of transfusion reactions, chills, infection, syndrome of volume overload and risk of hospitalization from various reasons and the patient is willing to proceed and went ahead to sign consent today. She will receive a unit of blood today I plan to reduce the dose of carboplatin a little bit in the future

## 2019-09-02 NOTE — Patient Instructions (Signed)

## 2019-09-02 NOTE — Assessment & Plan Note (Signed)
She has mild acute on chronic pain secondary to recent stent exchange I refill her prescriptions today We discussed narcotic refill policy 

## 2019-09-02 NOTE — Assessment & Plan Note (Addendum)
She had cystoscopy and bilateral stent exchange yesterday She was recently diagnosed with urinary tract infection but her symptoms resolved with ciprofloxacin She is developing profound pancytopenia today and not ready to resume chemotherapy I will proceed to order a unit of blood and delay chemotherapy to next week Her recurrent severe pancytopenia is likely combination of chemotherapy side effects and recent infection I plan to adjust the dose of carboplatin a little bit so that this does not recur I also plan to space out her treatment to every 28 days to allow additional time for bone marrow recovery I plan to repeat imaging study after cycle 6 of therapy

## 2019-09-02 NOTE — Assessment & Plan Note (Signed)
Her recent imaging study showed improvement of hydronephrosis I am hopeful, after cycle 6 of treatment, we will repeat another imaging study and if hydronephrosis resolves, we will get her stent removed

## 2019-09-02 NOTE — Progress Notes (Signed)
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[facility]) She had cystoscopy and bilateral stent exchange yesterday She was recently diagnosed with urinary tract infection but her symptoms resolved with ciprofloxacin She is developing profound pancytopenia today and not ready to resume chemotherapy I will proceed to order a unit of blood and delay chemotherapy to next week Her recurrent severe pancytopenia is likely combination of chemotherapy side effects and recent infection I plan to adjust the dose of carboplatin a little bit so that this does not recur I also plan to space out her treatment to every 28 days to allow additional time for bone marrow recovery I plan to repeat imaging study after cycle 6 of therapy  Pancytopenia, acquired ([place]) She has profound pancytopenia despite multiple dose adjustment We will delay her chemotherapy by 1 week and then turn every cycle to 28 days The patient has intermittent hematuria but I do not believe this is the cause of her profound, severe anemia We discussed some of the risks, benefits, and alternatives of blood transfusions. The patient is symptomatic from anemia and the hemoglobin level is critically low.  Some of the side-effects to be expected including risks of transfusion reactions, chills, infection, syndrome of volume overload and risk of hospitalization from various reasons and the patient is willing to proceed and went ahead to sign consent today. She will receive a unit of blood today I plan to reduce the dose of carboplatin a little bit in the future  UTI (urinary tract infection) She has recurrent urinary tract infection, complicated situation due to bilateral stent placements She will complete her course of antibiotics and we will delay her chemo  till next week  Cancer associated pain She has mild acute on chronic pain secondary to recent stent exchange I refill her prescriptions today We discussed narcotic refill policy  Bilateral hydronephrosis Her recent imaging study showed improvement of hydronephrosis I am hopeful, after cycle 6 of treatment, we will repeat another imaging study and if hydronephrosis resolves, we will get her stent removed   Orders Placed This Encounter  Procedures  . Care order/instruction    Transfuse Parameters    Standing Status:   Future    Standing Expiration Date:   09/01/2020  . Informed Consent Details: Physician/Practitioner Attestation; Transcribe to consent form and obtain patient signature    Standing Status:   Future    Standing Expiration Date:   09/01/2020    Order Specific Question:   Physician/Practitioner attestation of informed consent for blood and or blood product transfusion    Answer:   I, the physician/practitioner, attest that I have discussed with the patient the benefits, risks, side effects, alternatives, likelihood of achieving goals and potential problems during recovery for the procedure that I have provided informed consent.    Order Specific Question:   Product(s)    Answer:   All Product(s)  . Type and screen    Standing Status:   Future    Number of Occurrences:   1    Standing Expiration Date:   09/01/2020  . Prepare RBC (crossmatch)    Standing Status:   Standing    Number of Occurrences:   1    Order Specific Question:   # of Units    Answer:   1 unit  Order Specific Question:   Transfusion Indications    Answer:   Symptomatic Anemia    Order Specific Question:   Special Requirements    Answer:   Irradiated    Order Specific Question:   Number of Units to Keep Ahead    Answer:   NO units ahead    Order Specific Question:   Instructions:    Answer:   Transfuse    Order Specific Question:   If emergent release call blood bank    Answer:   Not emergent  release    All questions were answered. The patient knows to call the clinic with any problems, questions or concerns. The total time spent in the appointment was 30 minutes encounter with patients including review of chart and various tests results, discussions about plan of care and coordination of care plan   Heath Lark, MD 09/02/2019 9:18 AM  INTERVAL HISTORY: Please see below for problem oriented charting. She returns with her sons for further follow-up Her urinary tract infection symptoms has improved She had recent stent exchanged yesterday She has mild intermittent hematuria Her pain is likely exacerbated by recent stent exchange but otherwise well controlled She denies nausea She has occasional constipation, alleviated by laxatives No peripheral neuropathy  SUMMARY OF ONCOLOGIC HISTORY: Oncology History Overview Note  PD-L1 5%   Malignant neoplasm of endocervix ([place])  09/15/2017 Initial Diagnosis   The patient reports postcoital bleeding since August, 2019 and postmenopausal bleeding since November, 2019. 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    04/16/2018 Surgery   She was taken to the OR on 04/16/18 for a cervical cone biopsy. The specimen revealed a grossly normal cervix on the ectocervix.  Cone specimen revealed squamous cell carcinoma.  When discussing this with the pathologist he reported that in the actual cone specimen itself that was CIN 3 however there was a separate fragment of tissue that included carcinoma.  The dimensions of this was 0.6 cm, however this involved the margins.  A post cone ECC was positive for squamous cell carcinoma, as well as an endometrial curettage which also contain benign endometrial glands.    04/16/2018 Pathology Results   Endocervix curettage: [facility]   04/22/2018 Initial Diagnosis   Malignant neoplasm of endocervix ([place])   04/24/2018 PET scan   1. There is  a large mass involving the cervix and endometrium which has a maximum dimension of 5.3 cm within SUV max of 20.4. 2. Small bilateral pelvic sidewall lymph nodes measuring less than 1cm with mild nonspecific uptake. The no hypermetabolic adenopathy or evidence of distant metastatic disease. 3. Nonspecific pulmonary nodules measuring less than 5 mm are identified in the right lung. Too small to characterize by PET-CT.    04/27/2018 Imaging   MRI pelvis  1. Uterine cervix 5.5 x 3.4 x 3.2 cm mass compatible with primary cervical malignancy, with mild parametrial invasion. Stage IIB by MRI. 2. Small volume simple free fluid in the pelvic cul-de-sac. 3. No pathologically enlarged pelvic lymph nodes, see comments.   05/01/2018 Cancer Staging   Staging form: Cervix Uteri, AJCC 8th Edition - Clinical: FIGO Stage IIB (cT2b, cN0, cM0) - Signed by Heath Lark, MD on 05/01/2018   05/08/2018 Procedure   Placement of a subcutaneous port device. Catheter tip at the superior cavoatrial junction.   05/14/2018 - 07/27/2018 Radiation Therapy   Radiation treatment dates:  1. 05/14/2018 - 06/24/2018 2. 5/21, 5/28, 6/1, 6/9, 07/27/2018   Site/dose:    1.         A. pelvsi / 45 Gy in 25 fractions              B. Parametrial  boost / 9 Gy in 5 fractions 2. Cervix, Tandem and Ring System / 27.5 Gy in 5 fractions     05/15/2018 - 06/26/2018 Chemotherapy   The patient had weekly cisplatin   10/27/2018 PET scan   Near complete resolution of FDG uptake at site of previously seen cervical mass. Resolution of previously seen sub-cm pelvic lymph nodes. No evidence of metabolically-active metastatic disease.   05/13/2019 Imaging   1. There is ill-defined, hypoenhancing soft tissue most clearly visualized at the left aspect of the uterus and adnexa, which obstructs the distal left ureter, measuring approximately 4.0 x 3.4 cm. This is concerning for local recurrence of cervical malignancy.   2. Ill-defined, hypoenhancing  appearance of the cervix with loss of distinction of the fat plane to the posterior urinary bladder, in keeping with primary malignancy.   3. Interval enlargement of numerous abnormal retroperitoneal and bilateral inguinal lymph nodes, consistent with nodal metastatic disease.    4. There is new moderate left, mild right hydronephrosis and hydroureter, with soft tissue described above obstructing the distal left ureter in the vicinity of the adnexa. There is high-grade obstruction of the distal left ureter with no excreted contrast on delayed phase imaging. Point of obstruction of the right ureter is not clearly visualized, possibly at the ureterovesicular junction.      Genetic Testing   Patient has genetic testing done for PD-L1. Results revealed patient has the following: PD-L1 staining in tumor cells (TC): 5% PD-L1 staining in tumor associated immune cells (IC): <1% PD-L1 combined positive score (CPS): 5%   05/28/2019 -  Chemotherapy   The patient had carboplatin and taxol for chemotherapy treatment.     07/29/2019 Imaging   1. Mild improvement in mild left paraaortic retroperitoneal lymphadenopathy. No new or progressive metastatic disease identified. 2. No significant change in size of soft tissue mass in the left adnexa and parametrium. 3. Stable diffuse bladder wall thickening, consistent with cystitis and likely due to previous radiation therapy. 4. Interval placement of bilateral ureteral stents, with resolution of bilateral hydroureteronephrosis since prior study. 5. Stable diffuse left renal parenchymal atrophy. 6. Large stool burden noted; recommend clinical correlation for possible constipation.   09/01/2019 Surgery   Operative Note   Preoperative diagnosis:  1.  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  Same   Procedure(s): 1.  Cystoscopy with bilateral stent exchange 2.  Bilateral retrograde pyelograms with  intraoperative interpretation of fluoroscopic imaging   Surgeon: Ellison Hughs, MD    Specimens: 1.  Previously placed bilateral JJ stents were removed intact, inspected and discarded   Drains/Catheters: 1.  Bilateral 6 French, 24 cm Polaris stents without tethers   Intraoperative findings:   1. Right retrograde pyelogram revealed dilation starting in the mid to proximal aspects of the right ureter, extending up to the right renal pelvis.  No distal filling defects were identified. 2. Left retrograde pyelogram revealed dilation starting in the mid to proximal aspects of the left ureter, extending up to the left renal pelvis.  No distal filling defects were identified     REVIEW OF SYSTEMS:   Constitutional: Denies fevers, chills or abnormal weight loss Eyes: Denies  blurriness of vision Ears, nose, mouth, throat, and face: Denies mucositis or sore throat Respiratory: Denies cough, dyspnea or wheezes Cardiovascular: Denies palpitation, chest discomfort or lower extremity swelling Skin: Denies abnormal skin rashes Lymphatics: Denies new lymphadenopathy or easy bruising Neurological:Denies numbness, tingling or new weaknesses Behavioral/Psych: Mood is stable, no new changes  All other systems were reviewed with the patient and are negative.  I have reviewed the past medical history, past surgical history, social history and family history with the patient and they are unchanged from previous note.  ALLERGIES:  has No Known Allergies.  MEDICATIONS:  Current Outpatient Medications  Medication Sig Dispense Refill  . acetaminophen (TYLENOL) 325 MG tablet Take 650 mg by mouth every 6 (six) hours as needed. 08-23-2019 Pt takes one tablet twice daily with one percocet    . ciprofloxacin (CIPRO) 250 MG tablet Take 1 tablet (250 mg total) by mouth 2 (two) times daily. 14 tablet 0  . dexamethasone (DECADRON) 4 MG tablet Take 1 tablet (4 mg total) by mouth 2 (two) times daily. 36 tablet 0   . methadone (DOLOPHINE) 10 MG tablet Take 1 tablet (10 mg total) by mouth every 12 (twelve) hours. 30 tablet 0  . ondansetron (ZOFRAN) 8 MG tablet Take 1 tablet (8 mg total) by mouth 2 (two) times daily as needed for refractory nausea / vomiting. Start on day 3 after carboplatin chemo. 30 tablet 1  . oxybutynin (DITROPAN) 5 MG tablet Take 1 tablet (5 mg total) by mouth every 8 (eight) hours as needed for bladder spasms. (Patient taking differently: Take 5 mg by mouth every 8 (eight) hours as needed for bladder spasms. 08-23-2019 Per pt is taking on regular basis one tablet twice daily) 30 tablet 1  . oxyCODONE-acetaminophen (PERCOCET/ROXICET) 5-325 MG tablet Take 2 tablets by mouth every 6 (six) hours as needed for severe pain. (Patient taking differently: Take 2 tablets by mouth every 6 (six) hours as needed for severe pain. 08-23-2019  Per pt takes one tablet twice daily with one 338m tylenol) 60 tablet 0  . phenazopyridine (PYRIDIUM) 200 MG tablet Take 1 tablet (200 mg total) by mouth 3 (three) times daily as needed (for pain with urination). 30 tablet 0  . prochlorperazine (COMPAZINE) 10 MG tablet Take 1 tablet (10 mg total) by mouth every 6 (six) hours as needed (Nausea or vomiting). 30 tablet 1  . tamsulosin (FLOMAX) 0.4 MG CAPS capsule Take 0.4 mg by mouth 2 (two) times daily.     No current facility-administered medications for this visit.   Facility-Administered Medications Ordered in Other Visits  Medication Dose Route Frequency Provider Last Rate Last Admin  . 0.9 %  sodium chloride infusion (Manually program via Guardrails IV Fluids)  250 mL Intravenous Once GAlvy Bimler Bisma Klett, MD      . acetaminophen (TYLENOL) tablet 650 mg  650 mg Oral Once GAlvy Bimler Nguyet Mercer, MD      . diphenhydrAMINE (BENADRYL) capsule 25 mg  25 mg Oral Once GHeath Lark MD        PHYSICAL EXAMINATION: ECOG PERFORMANCE STATUS: 1 - Symptomatic but completely ambulatory  Vitals:   09/02/19 0800  BP: (!) 104/58  Pulse: 75   Resp: 20  Temp: 98 F (36.7 C)  SpO2: 98%   Filed Weights   09/02/19 0800  Weight: 149 lb 9.6 oz (67.9 kg)    GENERAL:alert, no distress and comfortable.  She looks pale SKIN: skin color, texture, turgor are normal, no rashes or significant lesions EYES: normal,  Conjunctiva are pink and non-injected, sclera clear OROPHARYNX:no exudate, no erythema and lips, buccal mucosa, and tongue normal  NECK: supple, thyroid normal size, non-tender, without nodularity LYMPH:  no palpable lymphadenopathy in the cervical, axillary or inguinal LUNGS: clear to auscultation and percussion with normal breathing effort HEART: Mildly tachycardic, no murmurs and no lower extremity edema ABDOMEN:abdomen soft, non-tender and normal bowel sounds Musculoskeletal:no cyanosis of digits and no clubbing  NEURO: alert & oriented x 3 with fluent speech, no focal motor/sensory deficits  LABORATORY DATA:  I have reviewed the data as listed    Component Value Date/Time   NA 139 09/02/2019 0718   K 4.2 09/02/2019 0718   CL 104 09/02/2019 0718   CO2 22 09/02/2019 0718   GLUCOSE 110 (H) 09/02/2019 0718   BUN 21 (H) 09/02/2019 0718   CREATININE 1.05 (H) 09/02/2019 0718   CALCIUM 9.9 09/02/2019 0718   PROT 6.3 (L) 09/02/2019 0718   ALBUMIN 3.3 (L) 09/02/2019 0718   AST 16 09/02/2019 0718   ALT 11 09/02/2019 0718   ALKPHOS 80 09/02/2019 0718   BILITOT 0.4 09/02/2019 0718   GFRNONAA >60 09/02/2019 0718   GFRAA >60 09/02/2019 0718    No results found for: SPEP, UPEP  Lab Results  Component Value Date   WBC 3.4 (L) 09/02/2019   NEUTROABS 2.8 09/02/2019   HGB 7.0 (L) 09/02/2019   HCT 20.9 (L) 09/02/2019   MCV 97.7 09/02/2019   PLT 71 (L) 09/02/2019      Chemistry      Component Value Date/Time   NA 139 09/02/2019 0718   K 4.2 09/02/2019 0718   CL 104 09/02/2019 0718   CO2 22 09/02/2019 0718   BUN 21 (H) 09/02/2019 0718   CREATININE 1.05 (H) 09/02/2019 0718      Component Value Date/Time    CALCIUM 9.9 09/02/2019 0718   ALKPHOS 80 09/02/2019 0718   AST 16 09/02/2019 0718   ALT 11 09/02/2019 0718   BILITOT 0.4 09/02/2019 8347

## 2019-09-02 NOTE — Assessment & Plan Note (Signed)
She has recurrent urinary tract infection, complicated situation due to bilateral stent placements She will complete her course of antibiotics and we will delay her chemo till next week

## 2019-09-03 LAB — BPAM RBC
Blood Product Expiration Date: 202108102359
ISSUE DATE / TIME: 202107220945
Unit Type and Rh: 5100

## 2019-09-03 LAB — TYPE AND SCREEN
ABO/RH(D): B POS
Antibody Screen: NEGATIVE
Unit division: 0

## 2019-09-06 ENCOUNTER — Telehealth: Payer: Self-pay | Admitting: Oncology

## 2019-09-06 ENCOUNTER — Other Ambulatory Visit: Payer: Self-pay | Admitting: Hematology and Oncology

## 2019-09-06 MED ORDER — OXYCODONE-ACETAMINOPHEN 5-325 MG PO TABS: 2.0000 | ORAL_TABLET | Freq: Four times a day (QID) | ORAL | 0 refills | Status: DC | PRN

## 2019-09-06 MED FILL — TAMSULOSIN HCL 0.4 MG CAP: 0.4 | 30 days supply | Qty: 60 | Fill #0

## 2019-09-06 MED FILL — OXYCODONE-APAP 5-325MG: 5-325 | 7 days supply | Qty: 60 | Fill #0

## 2019-09-06 NOTE — Telephone Encounter (Signed)
Sheena Torres left a message requesting a refill on Percocet  She said Dr. Alvy Bimler wanted her to call today to request the refill.

## 2019-09-06 NOTE — Telephone Encounter (Signed)
Called Rachel back and left a message advising the refill has been sent.

## 2019-09-07 MED FILL — OXYBUTYNIN CHLORIDE 5 MG TA: 5 | 30 days supply | Qty: 90 | Fill #0

## 2019-09-10 ENCOUNTER — Inpatient Hospital Stay: Payer: Medicaid Other

## 2019-09-10 ENCOUNTER — Other Ambulatory Visit: Payer: Self-pay

## 2019-09-10 VITALS — BP 117/80 | HR 70 | Temp 98.4°F | Resp 18 | Wt 149.5 lb

## 2019-09-10 DIAGNOSIS — T451X5A Adverse effect of antineoplastic and immunosuppressive drugs, initial encounter: Secondary | ICD-10-CM

## 2019-09-10 DIAGNOSIS — C530 Malignant neoplasm of endocervix: Secondary | ICD-10-CM

## 2019-09-10 DIAGNOSIS — Z5111 Encounter for antineoplastic chemotherapy: Secondary | ICD-10-CM | POA: Diagnosis not present

## 2019-09-10 DIAGNOSIS — D6481 Anemia due to antineoplastic chemotherapy: Secondary | ICD-10-CM

## 2019-09-10 LAB — CMP (CANCER CENTER ONLY)
ALT: 33 U/L (ref 0–44)
AST: 27 U/L (ref 15–41)
Albumin: 3.2 g/dL — ABNORMAL LOW (ref 3.5–5.0)
Alkaline Phosphatase: 74 U/L (ref 38–126)
Anion gap: 10 (ref 5–15)
BUN: 18 mg/dL (ref 6–20)
CO2: 24 mmol/L (ref 22–32)
Calcium: 10 mg/dL (ref 8.9–10.3)
Chloride: 106 mmol/L (ref 98–111)
Creatinine: 1.06 mg/dL — ABNORMAL HIGH (ref 0.44–1.00)
GFR, Est AFR Am: >60 mL/min (ref >60)
GFR, Estimated: 59 mL/min — ABNORMAL LOW (ref >60)
Glucose, Bld: 156 mg/dL — ABNORMAL HIGH (ref 70–99)
Potassium: 4.4 mmol/L (ref 3.5–5.1)
Sodium: 140 mmol/L (ref 135–145)
Total Bilirubin: 0.3 mg/dL (ref 0.3–1.2)
Total Protein: 6.2 g/dL — ABNORMAL LOW (ref 6.5–8.1)

## 2019-09-10 LAB — CBC WITH DIFFERENTIAL (CANCER CENTER ONLY)
Abs Immature Granulocytes: 0.01 10*3/uL (ref 0.00–0.07)
Basophils Absolute: 0 10*3/uL (ref 0.0–0.1)
Basophils Relative: 0 %
Eosinophils Absolute: 0 10*3/uL (ref 0.0–0.5)
Eosinophils Relative: 0 %
HCT: 27.8 % — ABNORMAL LOW (ref 36.0–46.0)
Hemoglobin: 9.1 g/dL — ABNORMAL LOW (ref 12.0–15.0)
Immature Granulocytes: 0 %
Lymphocytes Relative: 14 %
Lymphs Abs: 0.3 10*3/uL — ABNORMAL LOW (ref 0.7–4.0)
MCH: 31.9 pg (ref 26.0–34.0)
MCHC: 32.7 g/dL (ref 30.0–36.0)
MCV: 97.5 fL (ref 80.0–100.0)
Monocytes Absolute: 0 10*3/uL — ABNORMAL LOW (ref 0.1–1.0)
Monocytes Relative: 1 %
Neutro Abs: 1.9 10*3/uL (ref 1.7–7.7)
Neutrophils Relative %: 85 %
Platelet Count: 190 10*3/uL (ref 150–400)
RBC: 2.85 MIL/uL — ABNORMAL LOW (ref 3.87–5.11)
RDW: 18.2 % — ABNORMAL HIGH (ref 11.5–15.5)
WBC Count: 2.3 10*3/uL — ABNORMAL LOW (ref 4.0–10.5)
nRBC: 0 % (ref 0.0–0.2)

## 2019-09-10 LAB — SAMPLE TO BLOOD BANK

## 2019-09-10 MED ORDER — DIPHENHYDRAMINE HCL 50 MG/ML IJ SOLN
12.5000 mg | Freq: Once | INTRAMUSCULAR | Status: AC
  Administered 2019-09-10: 12.5 mg via INTRAVENOUS

## 2019-09-10 MED ORDER — PALONOSETRON HCL INJECTION 0.25 MG/5ML
INTRAVENOUS | Status: AC
  Filled 2019-09-10: qty 5

## 2019-09-10 MED ORDER — SODIUM CHLORIDE 0.9 % IV SOLN
Freq: Once | INTRAVENOUS | Status: AC
  Filled 2019-09-10: qty 250

## 2019-09-10 MED ORDER — FAMOTIDINE IN NACL 20-0.9 MG/50ML-% IV SOLN
20.0000 mg | Freq: Once | INTRAVENOUS | Status: AC
  Administered 2019-09-10: 20 mg via INTRAVENOUS

## 2019-09-10 MED ORDER — SODIUM CHLORIDE 0.9 % IV SOLN
140.0000 mg/m2 | Freq: Once | INTRAVENOUS | Status: AC
  Administered 2019-09-10: 240 mg via INTRAVENOUS
  Filled 2019-09-10: qty 40

## 2019-09-10 MED ORDER — SODIUM CHLORIDE 0.9 % IV SOLN
150.0000 mg | Freq: Once | INTRAVENOUS | Status: AC
  Administered 2019-09-10: 150 mg via INTRAVENOUS
  Filled 2019-09-10: qty 5
  Filled 2019-09-10: qty 150

## 2019-09-10 MED ORDER — FAMOTIDINE IN NACL 20-0.9 MG/50ML-% IV SOLN
INTRAVENOUS | Status: AC
  Filled 2019-09-10: qty 50

## 2019-09-10 MED ORDER — PALONOSETRON HCL INJECTION 0.25 MG/5ML
0.2500 mg | Freq: Once | INTRAVENOUS | Status: AC
  Administered 2019-09-10: 0.25 mg via INTRAVENOUS

## 2019-09-10 MED ORDER — DIPHENHYDRAMINE HCL 12.5 MG/5ML PO ELIX
ORAL_SOLUTION | ORAL | Status: AC
  Filled 2019-09-10: qty 5

## 2019-09-10 MED ORDER — SODIUM CHLORIDE 0.9 % IV SOLN
10.0000 mg | Freq: Once | INTRAVENOUS | Status: AC
  Administered 2019-09-10: 10 mg via INTRAVENOUS
  Filled 2019-09-10: qty 1
  Filled 2019-09-10: qty 10

## 2019-09-10 MED ORDER — SODIUM CHLORIDE 0.9 % IV SOLN
315.0000 mg | Freq: Once | INTRAVENOUS | Status: AC
  Administered 2019-09-10: 320 mg via INTRAVENOUS
  Filled 2019-09-10: qty 32

## 2019-09-10 MED ORDER — DIPHENHYDRAMINE HCL 50 MG/ML IJ SOLN
INTRAMUSCULAR | Status: AC
  Filled 2019-09-10: qty 1

## 2019-09-10 NOTE — Patient Instructions (Signed)
[facility] °Discharge Instructions for Patients Receiving Chemotherapy ° °Today you received the following chemotherapy agents Taxol; Carboplatin ° °To help prevent nausea and vomiting after your treatment, we encourage you to take your nausea medication as directed °  °If you develop nausea and vomiting that is not controlled by your nausea medication, call the clinic.  ° °BELOW ARE SYMPTOMS THAT SHOULD BE REPORTED IMMEDIATELY: °· *FEVER GREATER THAN 100.5 F °· *CHILLS WITH OR WITHOUT FEVER °· NAUSEA AND VOMITING THAT IS NOT CONTROLLED WITH YOUR NAUSEA MEDICATION °· *UNUSUAL SHORTNESS OF BREATH °· *UNUSUAL BRUISING OR BLEEDING °· TENDERNESS IN MOUTH AND THROAT WITH OR WITHOUT PRESENCE OF ULCERS °· *URINARY PROBLEMS °· *BOWEL PROBLEMS °· UNUSUAL RASH °Items with * indicate a potential emergency and should be followed up as soon as possible. ° °Feel free to call the clinic should you have any questions or concerns. The clinic phone number is [phone]. ° °Please show the CHEMO ALERT CARD at check-in to the Emergency Department and triage nurse. ° ° °

## 2019-09-12 ENCOUNTER — Emergency Department (HOSPITAL_COMMUNITY)
Admission: EM | Admit: 2019-09-12 | Discharge: 2019-09-12 | Disposition: A | Discharge status: Home / Self Care | Payer: Medicaid Other | Attending: Emergency Medicine | Admitting: Emergency Medicine

## 2019-09-12 ENCOUNTER — Emergency Department (HOSPITAL_COMMUNITY): Payer: Medicaid Other

## 2019-09-12 DIAGNOSIS — R079 Chest pain, unspecified: Secondary | ICD-10-CM | POA: Diagnosis present

## 2019-09-12 DIAGNOSIS — Z5321 Procedure and treatment not carried out due to patient leaving prior to being seen by health care provider: Secondary | ICD-10-CM | POA: Insufficient documentation

## 2019-09-12 LAB — CBC
HCT: 26.5 % — ABNORMAL LOW (ref 36.0–46.0)
Hemoglobin: 8.5 g/dL — ABNORMAL LOW (ref 12.0–15.0)
MCH: 32.4 pg (ref 26.0–34.0)
MCHC: 32.1 g/dL (ref 30.0–36.0)
MCV: 101.1 fL — ABNORMAL HIGH (ref 80.0–100.0)
Platelets: 189 10*3/uL (ref 150–400)
RBC: 2.62 MIL/uL — ABNORMAL LOW (ref 3.87–5.11)
RDW: 18.8 % — ABNORMAL HIGH (ref 11.5–15.5)
WBC: 2.5 10*3/uL — ABNORMAL LOW (ref 4.0–10.5)
nRBC: 0 % (ref 0.0–0.2)

## 2019-09-12 LAB — I-STAT BETA HCG BLOOD, ED (NOT ORDERABLE): I-stat hCG, quantitative: <5 m[IU]/mL (ref <5)

## 2019-09-12 LAB — BASIC METABOLIC PANEL
Anion gap: 8 (ref 5–15)
BUN: 29 mg/dL — ABNORMAL HIGH (ref 6–20)
CO2: 29 mmol/L (ref 22–32)
Calcium: 8.6 mg/dL — ABNORMAL LOW (ref 8.9–10.3)
Chloride: 101 mmol/L (ref 98–111)
Creatinine, Ser: 1.01 mg/dL — ABNORMAL HIGH (ref 0.44–1.00)
GFR calc Af Amer: >60 mL/min (ref >60)
GFR calc non Af Amer: >60 mL/min (ref >60)
Glucose, Bld: 96 mg/dL (ref 70–99)
Potassium: 4.1 mmol/L (ref 3.5–5.1)
Sodium: 138 mmol/L (ref 135–145)

## 2019-09-12 LAB — TROPONIN I (HIGH SENSITIVITY): Troponin I (High Sensitivity): <2 ng/L (ref <18)

## 2019-09-12 NOTE — ED Triage Notes (Signed)
Patient reports chest pain starting around 4 am. Cancer patient. Active chemo. Patient says that her chest pain is centrally located, feels like a stabbing pain and then goes away.

## 2019-09-13 ENCOUNTER — Telehealth: Payer: Self-pay | Admitting: Oncology

## 2019-09-13 NOTE — Telephone Encounter (Signed)
Sheena Torres called and said she went to the ER at [facility] yesterday for chest pain.  She said it was sharp pains in her central chest on and off.  She waited in the ER for 3 hours without seeing a doctor and was told it was going to be another 2 hours before she was seen so she left.  She is also very weak/no energy.  Her blood pressure in the ER was 83/65.   She said the chest pain has stopped by her central chest is sore/tender. She is eating and drinking ok. She is wondering if she needs blood. Her HGB was 8.5 yesterday.

## 2019-09-13 NOTE — Telephone Encounter (Signed)
Left a message regarding recommendations from Dr. Alvy Bimler.  Sheena Torres called back and said she doesn't think she needs to be seen today.  She will call back if she needs anything.

## 2019-09-13 NOTE — Telephone Encounter (Signed)
I am not sure if her labs would drop in 1 day Her chest pain is not due to her heart; cardiac enzymes are ok I cannot see her today For pain, she can take her pain meds If she is worried about blood, we can check it and see If she needs to be seen today, she needs to go to symptom management

## 2019-09-17 ENCOUNTER — Telehealth: Payer: Self-pay | Admitting: Oncology

## 2019-09-17 NOTE — Telephone Encounter (Signed)
Sheena Torres called and said she is still having swelling in her right upper leg/thigh off and on.  She has a compression stocking and tries to keep it elevated.  She was told by the a nurse at [facility] to contact her oncologist because the swelling is not being caused by her stents and could possibly be a blood clot. She denies having any redness or pain in the area.  She wants to check with Dr. Alvy Bimler again to make sure she doesn't need to do anything else.

## 2019-09-17 NOTE — Telephone Encounter (Signed)
Called Alfredo and notified her of message below from Dr. Alvy Bimler.  She verbalized understanding.

## 2019-09-17 NOTE — Telephone Encounter (Signed)
The mostly cause of her swelling is the involvement of lymph nodes in her abdomen We already knew from her CT I do not think this is a blood clot

## 2019-09-21 ENCOUNTER — Telehealth: Payer: Self-pay

## 2019-09-21 ENCOUNTER — Other Ambulatory Visit: Payer: Self-pay | Admitting: Hematology and Oncology

## 2019-09-21 MED ORDER — OXYCODONE-ACETAMINOPHEN 5-325 MG PO TABS: 2.0000 | ORAL_TABLET | Freq: Four times a day (QID) | ORAL | 0 refills | Status: DC | PRN

## 2019-09-21 MED FILL — OXYCODONE-APAP 5-325MG: 5-325 | 7 days supply | Qty: 60 | Fill #0

## 2019-09-21 NOTE — Telephone Encounter (Signed)
Called and told Rx sent to [facility]. She verbalized understanding.

## 2019-09-21 NOTE — Telephone Encounter (Signed)
Sent to [facility] ?

## 2019-09-21 NOTE — Telephone Encounter (Signed)
She called and left a message. She is requesting a Percocet refill.

## 2019-10-07 ENCOUNTER — Inpatient Hospital Stay: Payer: Medicaid Other

## 2019-10-07 ENCOUNTER — Inpatient Hospital Stay: Payer: Medicaid Other | Attending: Hematology and Oncology

## 2019-10-07 ENCOUNTER — Encounter: Payer: Self-pay | Admitting: Hematology and Oncology

## 2019-10-07 ENCOUNTER — Other Ambulatory Visit: Payer: Self-pay | Admitting: *Deleted

## 2019-10-07 ENCOUNTER — Inpatient Hospital Stay (HOSPITAL_BASED_OUTPATIENT_CLINIC_OR_DEPARTMENT_OTHER): Payer: Medicaid Other | Admitting: Hematology and Oncology

## 2019-10-07 ENCOUNTER — Other Ambulatory Visit: Payer: Self-pay

## 2019-10-07 VITALS — BP 137/98 | HR 70 | Temp 98.0°F | Resp 16

## 2019-10-07 VITALS — BP 125/69 | HR 81 | Temp 97.6°F | Resp 18 | Ht 62.0 in | Wt 148.2 lb

## 2019-10-07 DIAGNOSIS — G893 Neoplasm related pain (acute) (chronic): Secondary | ICD-10-CM | POA: Diagnosis not present

## 2019-10-07 DIAGNOSIS — R600 Localized edema: Secondary | ICD-10-CM

## 2019-10-07 DIAGNOSIS — C530 Malignant neoplasm of endocervix: Secondary | ICD-10-CM | POA: Insufficient documentation

## 2019-10-07 DIAGNOSIS — D61818 Other pancytopenia: Secondary | ICD-10-CM

## 2019-10-07 DIAGNOSIS — Z5111 Encounter for antineoplastic chemotherapy: Secondary | ICD-10-CM | POA: Insufficient documentation

## 2019-10-07 DIAGNOSIS — G620 Drug-induced polyneuropathy: Secondary | ICD-10-CM

## 2019-10-07 DIAGNOSIS — D649 Anemia, unspecified: Secondary | ICD-10-CM | POA: Diagnosis not present

## 2019-10-07 DIAGNOSIS — T451X5A Adverse effect of antineoplastic and immunosuppressive drugs, initial encounter: Secondary | ICD-10-CM

## 2019-10-07 LAB — CMP (CANCER CENTER ONLY)
ALT: <6 U/L (ref 0–44)
AST: 14 U/L — ABNORMAL LOW (ref 15–41)
Albumin: 3.3 g/dL — ABNORMAL LOW (ref 3.5–5.0)
Alkaline Phosphatase: 77 U/L (ref 38–126)
Anion gap: 8 (ref 5–15)
BUN: 10 mg/dL (ref 6–20)
CO2: 25 mmol/L (ref 22–32)
Calcium: 9.8 mg/dL (ref 8.9–10.3)
Chloride: 105 mmol/L (ref 98–111)
Creatinine: 1.05 mg/dL — ABNORMAL HIGH (ref 0.44–1.00)
GFR, Est AFR Am: >60 mL/min (ref >60)
GFR, Estimated: >60 mL/min (ref >60)
Glucose, Bld: 174 mg/dL — ABNORMAL HIGH (ref 70–99)
Potassium: 4.1 mmol/L (ref 3.5–5.1)
Sodium: 138 mmol/L (ref 135–145)
Total Bilirubin: 0.4 mg/dL (ref 0.3–1.2)
Total Protein: 6.3 g/dL — ABNORMAL LOW (ref 6.5–8.1)

## 2019-10-07 LAB — CBC WITH DIFFERENTIAL (CANCER CENTER ONLY)
Abs Immature Granulocytes: 0.02 10*3/uL (ref 0.00–0.07)
Basophils Absolute: 0 10*3/uL (ref 0.0–0.1)
Basophils Relative: 0 %
Eosinophils Absolute: 0 10*3/uL (ref 0.0–0.5)
Eosinophils Relative: 0 %
HCT: 24.1 % — ABNORMAL LOW (ref 36.0–46.0)
Hemoglobin: 8 g/dL — ABNORMAL LOW (ref 12.0–15.0)
Immature Granulocytes: 1 %
Lymphocytes Relative: 14 %
Lymphs Abs: 0.5 10*3/uL — ABNORMAL LOW (ref 0.7–4.0)
MCH: 33.2 pg (ref 26.0–34.0)
MCHC: 33.2 g/dL (ref 30.0–36.0)
MCV: 100 fL (ref 80.0–100.0)
Monocytes Absolute: 0 10*3/uL — ABNORMAL LOW (ref 0.1–1.0)
Monocytes Relative: 1 %
Neutro Abs: 3.3 10*3/uL (ref 1.7–7.7)
Neutrophils Relative %: 84 %
Platelet Count: 76 10*3/uL — ABNORMAL LOW (ref 150–400)
RBC: 2.41 MIL/uL — ABNORMAL LOW (ref 3.87–5.11)
RDW: 18.2 % — ABNORMAL HIGH (ref 11.5–15.5)
WBC Count: 3.9 10*3/uL — ABNORMAL LOW (ref 4.0–10.5)
nRBC: 0 % (ref 0.0–0.2)

## 2019-10-07 LAB — PREPARE RBC (CROSSMATCH)

## 2019-10-07 LAB — SAMPLE TO BLOOD BANK

## 2019-10-07 MED ORDER — SODIUM CHLORIDE 0.9 % IV SOLN
150.0000 mg | Freq: Once | INTRAVENOUS | Status: AC
  Administered 2019-10-07: 150 mg via INTRAVENOUS
  Filled 2019-10-07: qty 150

## 2019-10-07 MED ORDER — PALONOSETRON HCL INJECTION 0.25 MG/5ML
INTRAVENOUS | Status: AC
  Filled 2019-10-07: qty 5

## 2019-10-07 MED ORDER — SODIUM CHLORIDE 0.9 % IV SOLN
10.0000 mg | Freq: Once | INTRAVENOUS | Status: AC
  Administered 2019-10-07: 10 mg via INTRAVENOUS
  Filled 2019-10-07: qty 10

## 2019-10-07 MED ORDER — PALONOSETRON HCL INJECTION 0.25 MG/5ML
0.2500 mg | Freq: Once | INTRAVENOUS | Status: AC
  Administered 2019-10-07: 0.25 mg via INTRAVENOUS

## 2019-10-07 MED ORDER — DIPHENHYDRAMINE HCL 50 MG/ML IJ SOLN
INTRAMUSCULAR | Status: AC
  Filled 2019-10-07: qty 1

## 2019-10-07 MED ORDER — DIPHENHYDRAMINE HCL 50 MG/ML IJ SOLN
12.5000 mg | Freq: Once | INTRAMUSCULAR | Status: AC
  Administered 2019-10-07: 12.5 mg via INTRAVENOUS

## 2019-10-07 MED ORDER — DIPHENHYDRAMINE HCL 25 MG PO CAPS
25.0000 mg | ORAL_CAPSULE | Freq: Once | ORAL | Status: AC
  Administered 2019-10-07: 25 mg via ORAL

## 2019-10-07 MED ORDER — SODIUM CHLORIDE 0.9 % IV SOLN
105.0000 mg/m2 | Freq: Once | INTRAVENOUS | Status: AC
  Administered 2019-10-07: 180 mg via INTRAVENOUS
  Filled 2019-10-07: qty 30

## 2019-10-07 MED ORDER — SODIUM CHLORIDE 0.9 % IV SOLN
317.4500 mg | Freq: Once | INTRAVENOUS | Status: AC
  Administered 2019-10-07: 320 mg via INTRAVENOUS
  Filled 2019-10-07: qty 32

## 2019-10-07 MED ORDER — DIPHENHYDRAMINE HCL 25 MG PO CAPS
ORAL_CAPSULE | ORAL | Status: AC
  Filled 2019-10-07: qty 1

## 2019-10-07 MED ORDER — FAMOTIDINE IN NACL 20-0.9 MG/50ML-% IV SOLN
INTRAVENOUS | Status: AC
  Filled 2019-10-07: qty 50

## 2019-10-07 MED ORDER — OXYCODONE-ACETAMINOPHEN 5-325 MG PO TABS: 2.0000 | ORAL_TABLET | Freq: Four times a day (QID) | ORAL | 0 refills | Status: DC | PRN

## 2019-10-07 MED ORDER — FAMOTIDINE IN NACL 20-0.9 MG/50ML-% IV SOLN
20.0000 mg | Freq: Once | INTRAVENOUS | Status: AC
  Administered 2019-10-07: 20 mg via INTRAVENOUS

## 2019-10-07 MED ORDER — ACETAMINOPHEN 325 MG PO TABS
650.0000 mg | ORAL_TABLET | Freq: Once | ORAL | Status: AC
  Administered 2019-10-07: 650 mg via ORAL

## 2019-10-07 MED ORDER — ACETAMINOPHEN 325 MG PO TABS
ORAL_TABLET | ORAL | Status: AC
  Filled 2019-10-07: qty 2

## 2019-10-07 MED ORDER — SODIUM CHLORIDE 0.9 % IV SOLN
Freq: Once | INTRAVENOUS | Status: AC
  Filled 2019-10-07: qty 250

## 2019-10-07 MED FILL — TAMSULOSIN HCL 0.4 MG CAP: 0.4 | 30 days supply | Qty: 60 | Fill #1

## 2019-10-07 MED FILL — OXYCODONE-APAP 5-325MG: 5-325 | 11 days supply | Qty: 90 | Fill #0

## 2019-10-07 NOTE — Assessment & Plan Note (Signed)
Her leg swelling and recent bleeding is not always indicative of disease progression We will proceed with chemotherapy as scheduled today I will also proceed to give her a unit of blood due to symptomatic anemia I recommend ordering imaging study next week to assess response to therapy She will likely need transfusion again next week She is in agreement with the plan of care

## 2019-10-07 NOTE — Assessment & Plan Note (Signed)
She has mild acute on chronic pain secondary to recent stent exchange I refill her prescriptions today We discussed narcotic refill policy

## 2019-10-07 NOTE — Progress Notes (Signed)
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[facility]) Her leg swelling and recent bleeding is not always indicative of disease progression We will proceed with chemotherapy as scheduled today I will also proceed to give her a unit of blood due to symptomatic anemia I recommend ordering imaging study next week to assess response to therapy She will likely need transfusion again next week She is in agreement with the plan of care  Pancytopenia, acquired [facility]) She has significant pancytopenia due to treatment Due to mild neuropathy, I also plan to reduce the dose of Taxol little bit We will proceed with treatment without delay I recommend a unit of blood due to symptomatic anemia and she is in agreement We discussed some of the risks, benefits, and alternatives of blood transfusions. The patient is symptomatic from anemia and the hemoglobin level is critically low.  Some of the side-effects to be expected including risks of transfusion reactions, chills, infection, syndrome of volume overload and risk of hospitalization from various reasons and the patient is willing to proceed and went ahead to sign consent today.   Edema of right lower extremity Her intermittent right lower extremity edema is related to abdominal disease She is using elastic compression hose I recommend physical therapy and she is in agreement  Cancer associated pain She has mild acute on chronic pain secondary to recent stent exchange I refill her prescriptions today We discussed narcotic refill policy  Peripheral neuropathy due to chemotherapy [facility]) she has mild peripheral neuropathy, likely related to side effects of treatment. I plan to reduce the dose of treatment as outlined above.    Orders Placed This Encounter   Procedures  . CT CHEST ABDOMEN PELVIS W CONTRAST    Standing Status:   Future    Standing Expiration Date:   10/06/2020    Order Specific Question:   Reason for Exam (SYMPTOM  OR DIAGNOSIS REQUIRED)    Answer:   metastatic cervical cancer, assess response to Rx    Order Specific Question:   Preferred imaging location?    Answer:   [facility]    Order Specific Question:   Radiology Contrast Protocol - do NOT remove file path    Answer:   [email or web address]    Order Specific Question:   Is patient pregnant?    Answer:   No  . Ambulatory referral to Physical Therapy    Referral Priority:   Routine    Referral Type:   Physical Medicine    Referral Reason:   Specialty Services Required    Requested Specialty:   Physical Therapy    Number of Visits Requested:   1  . Informed Consent Details: Physician/Practitioner Attestation; Transcribe to consent form and obtain patient signature    Standing Status:   Future    Standing Expiration Date:   10/06/2020    Order Specific Question:   Physician/Practitioner attestation of informed consent for blood and or blood product transfusion    Answer:   I, the physician/practitioner, attest that I have discussed with the patient the benefits, risks, side effects, alternatives, likelihood of achieving goals and potential problems during recovery for the procedure that I have provided informed consent.    Order Specific Question:   Product(s)    Answer:   All Product(s)  . Care order/instruction  Transfuse Parameters    Standing Status:   Future    Standing Expiration Date:   10/06/2020  . Type and screen    Standing Status:   Future    Number of Occurrences:   1    Standing Expiration Date:   10/06/2020  . Prepare RBC (crossmatch)    Standing Status:   Standing    Number of Occurrences:   1    Order Specific Question:   # of Units    Answer:   1 unit    Order Specific Question:   Transfusion Indications    Answer:    Symptomatic Anemia    Order Specific Question:   Special Requirements    Answer:   Irradiated    Order Specific Question:   Number of Units to Keep Ahead    Answer:   NO units ahead    Order Specific Question:   Instructions:    Answer:   Transfuse    Order Specific Question:   If emergent release call blood bank    Answer:   Not emergent release    All questions were answered. The patient knows to call the clinic with any problems, questions or concerns. The total time spent in the appointment was 40 minutes encounter with patients including review of chart and various tests results, discussions about plan of care and coordination of care plan   Heath Lark, MD 10/07/2019 9:11 AM  INTERVAL HISTORY: Please see below for problem oriented charting. She returns for further follow-up with her son She was having significant abdominal distention a day ago but subsequently passed a lot of blood clots and blood in her urine and have significant urination Since then, her abdominal distention has resolved She denies recent nausea vomiting No recent constipation Her pain is well controlled No recent fever or chills She has no further chest pain She complained of mild fatigue She have noticed some mild peripheral neuropathy affecting her feet especially on the right side intermittently  SUMMARY OF ONCOLOGIC HISTORY: Oncology History Overview Note  PD-L1 5%   Malignant neoplasm of endocervix ([place])  09/15/2017 Initial Diagnosis   The patient reports postcoital bleeding since August, 2019 and postmenopausal bleeding since November, 2019. 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    04/16/2018 Surgery   She was taken to the OR on 04/16/18 for a cervical cone biopsy. The specimen revealed a grossly normal cervix on the ectocervix.  Cone specimen revealed squamous cell carcinoma.  When discussing this with the  pathologist he reported that in the actual cone specimen itself that was CIN 3 however there was a separate fragment of tissue that included carcinoma.  The dimensions of this was 0.6 cm, however this involved the margins.  A post cone ECC was positive for squamous cell carcinoma, as well as an endometrial curettage which also contain benign endometrial glands.    04/16/2018 Pathology Results   Endocervix curettage: [facility]   04/22/2018 Initial Diagnosis   Malignant neoplasm of endocervix ([place])   04/24/2018 PET scan   1. There is a large mass involving the cervix and endometrium which has a maximum dimension of 5.3 cm within SUV max of 20.4. 2. Small bilateral pelvic sidewall lymph nodes measuring less than 1cm with mild nonspecific uptake. The no hypermetabolic adenopathy or evidence of distant metastatic disease. 3. Nonspecific pulmonary nodules measuring less than 5 mm  are identified in the right lung. Too small to characterize by PET-CT.    04/27/2018 Imaging   MRI pelvis  1. Uterine cervix 5.5 x 3.4 x 3.2 cm mass compatible with primary cervical malignancy, with mild parametrial invasion. Stage IIB by MRI. 2. Small volume simple free fluid in the pelvic cul-de-sac. 3. No pathologically enlarged pelvic lymph nodes, see comments.   05/01/2018 Cancer Staging   Staging form: Cervix Uteri, AJCC 8th Edition - Clinical: FIGO Stage IIB (cT2b, cN0, cM0) - Signed by Heath Lark, MD on 05/01/2018   05/08/2018 Procedure   Placement of a subcutaneous port device. Catheter tip at the superior cavoatrial junction.   05/14/2018 - 07/27/2018 Radiation Therapy   Radiation treatment dates:    1. 05/14/2018 - 06/24/2018 2. 5/21, 5/28, 6/1, 6/9, 07/27/2018   Site/dose:    1.         A. pelvsi / 45 Gy in 25 fractions              B. Parametrial  boost / 9 Gy in 5 fractions 2. Cervix, Tandem and Ring System / 27.5 Gy in 5 fractions     05/15/2018 - 06/26/2018 Chemotherapy   The patient had weekly cisplatin    10/27/2018 PET scan   Near complete resolution of FDG uptake at site of previously seen cervical mass. Resolution of previously seen sub-cm pelvic lymph nodes. No evidence of metabolically-active metastatic disease.   05/13/2019 Imaging   1. There is ill-defined, hypoenhancing soft tissue most clearly visualized at the left aspect of the uterus and adnexa, which obstructs the distal left ureter, measuring approximately 4.0 x 3.4 cm. This is concerning for local recurrence of cervical malignancy.   2. Ill-defined, hypoenhancing appearance of the cervix with loss of distinction of the fat plane to the posterior urinary bladder, in keeping with primary malignancy.   3. Interval enlargement of numerous abnormal retroperitoneal and bilateral inguinal lymph nodes, consistent with nodal metastatic disease.    4. There is new moderate left, mild right hydronephrosis and hydroureter, with soft tissue described above obstructing the distal left ureter in the vicinity of the adnexa. There is high-grade obstruction of the distal left ureter with no excreted contrast on delayed phase imaging. Point of obstruction of the right ureter is not clearly visualized, possibly at the ureterovesicular junction.      Genetic Testing   Patient has genetic testing done for PD-L1. Results revealed patient has the following: PD-L1 staining in tumor cells (TC): 5% PD-L1 staining in tumor associated immune cells (IC): <1% PD-L1 combined positive score (CPS): 5%   05/28/2019 -  Chemotherapy   The patient had carboplatin and taxol for chemotherapy treatment.     07/29/2019 Imaging   1. Mild improvement in mild left paraaortic retroperitoneal lymphadenopathy. No new or progressive metastatic disease identified. 2. No significant change in size of soft tissue mass in the left adnexa and parametrium. 3. Stable diffuse bladder wall thickening, consistent with cystitis and likely due to previous radiation therapy. 4. Interval  placement of bilateral ureteral stents, with resolution of bilateral hydroureteronephrosis since prior study. 5. Stable diffuse left renal parenchymal atrophy. 6. Large stool burden noted; recommend clinical correlation for possible constipation.   09/01/2019 Surgery   Operative Note   Preoperative diagnosis:  1.  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  Same   Procedure(s): 1.  Cystoscopy with bilateral stent exchange 2.  Bilateral retrograde pyelograms with intraoperative  interpretation of fluoroscopic imaging   Surgeon: Ellison Hughs, MD    Specimens: 1.  Previously placed bilateral JJ stents were removed intact, inspected and discarded   Drains/Catheters: 1.  Bilateral 6 French, 24 cm Polaris stents without tethers   Intraoperative findings:   1. Right retrograde pyelogram revealed dilation starting in the mid to proximal aspects of the right ureter, extending up to the right renal pelvis.  No distal filling defects were identified. 2. Left retrograde pyelogram revealed dilation starting in the mid to proximal aspects of the left ureter, extending up to the left renal pelvis.  No distal filling defects were identified     REVIEW OF SYSTEMS:   Constitutional: Denies fevers, chills or abnormal weight loss Eyes: Denies blurriness of vision Ears, nose, mouth, throat, and face: Denies mucositis or sore throat Respiratory: Denies cough, dyspnea or wheezes Cardiovascular: Denies palpitation, chest discomfort Gastrointestinal:  Denies nausea, heartburn or change in bowel habits Skin: Denies abnormal skin rashes Lymphatics: Denies new lymphadenopathy or easy bruising Behavioral/Psych: Mood is stable, no new changes  All other systems were reviewed with the patient and are negative.  I have reviewed the past medical history, past surgical history, social history and family history with the patient and they are  unchanged from previous note.  ALLERGIES:  has No Known Allergies.  MEDICATIONS:  Current Outpatient Medications  Medication Sig Dispense Refill  . acetaminophen (TYLENOL) 325 MG tablet Take 650 mg by mouth every 6 (six) hours as needed. 08-23-2019 Pt takes one tablet twice daily with one percocet    . dexamethasone (DECADRON) 4 MG tablet Take 1 tablet (4 mg total) by mouth 2 (two) times daily. 36 tablet 0  . methadone (DOLOPHINE) 10 MG tablet Take 1 tablet (10 mg total) by mouth every 12 (twelve) hours. 30 tablet 0  . ondansetron (ZOFRAN) 8 MG tablet Take 1 tablet (8 mg total) by mouth 2 (two) times daily as needed for refractory nausea / vomiting. Start on day 3 after carboplatin chemo. 30 tablet 1  . oxybutynin (DITROPAN) 5 MG tablet Take 1 tablet (5 mg total) by mouth every 8 (eight) hours as needed for bladder spasms. (Patient taking differently: Take 5 mg by mouth every 8 (eight) hours as needed for bladder spasms. 08-23-2019 Per pt is taking on regular basis one tablet twice daily) 30 tablet 1  . oxyCODONE-acetaminophen (PERCOCET/ROXICET) 5-325 MG tablet Take 2 tablets by mouth every 6 (six) hours as needed for severe pain. 90 tablet 0  . prochlorperazine (COMPAZINE) 10 MG tablet Take 1 tablet (10 mg total) by mouth every 6 (six) hours as needed (Nausea or vomiting). 30 tablet 1  . tamsulosin (FLOMAX) 0.4 MG CAPS capsule Take 0.4 mg by mouth 2 (two) times daily.     No current facility-administered medications for this visit.   Facility-Administered Medications Ordered in Other Visits  Medication Dose Route Frequency Provider Last Rate Last Admin  . CARBOplatin (PARAPLATIN) 320 mg in sodium chloride 0.9 % 100 mL chemo infusion  320 mg Intravenous Once Alvy Bimler, Tiani Stanbery, MD      . dexamethasone (DECADRON) 10 mg in sodium chloride 0.9 % 50 mL IVPB  10 mg Intravenous Once Matraca Hunkins, MD      . diphenhydrAMINE (BENADRYL) injection 12.5 mg  12.5 mg Intravenous Once Alvy Bimler, Dereon Corkery, MD      .  famotidine (PEPCID) IVPB 20 mg premix  20 mg Intravenous Once Alvy Bimler, Rosalin Buster, MD      . fosaprepitant (EMEND) 150  mg in sodium chloride 0.9 % 145 mL IVPB  150 mg Intravenous Once Alvy Bimler, Kelvon Giannini, MD      . PACLitaxel (TAXOL) 180 mg in sodium chloride 0.9 % 250 mL chemo infusion (> 74m/m2)  105 mg/m2 (Treatment Plan Recorded) Intravenous Once Blondina Coderre, MD      . palonosetron (ALOXI) injection 0.25 mg  0.25 mg Intravenous Once GAlvy Bimler Nataleigh Griffin, MD        PHYSICAL EXAMINATION: ECOG PERFORMANCE STATUS: 1 - Symptomatic but completely ambulatory  Vitals:   10/07/19 0810  BP: 125/69  Pulse: 81  Resp: 18  Temp: 97.6 F (36.4 C)  SpO2: 100%   Filed Weights   10/07/19 0810  Weight: 148 lb 3.2 oz (67.2 kg)    GENERAL:alert, no distress and comfortable.  She looks pale SKIN: skin color, texture, turgor are normal, no rashes or significant lesions EYES: normal, Conjunctiva are pink and non-injected, sclera clear OROPHARYNX:no exudate, no erythema and lips, buccal mucosa, and tongue normal  NECK: supple, thyroid normal size, non-tender, without nodularity LYMPH:  no palpable lymphadenopathy in the cervical, axillary or inguinal LUNGS: clear to auscultation and percussion with normal breathing effort HEART: Noted some mild tachycardia, with right lower extremity edema ABDOMEN:abdomen soft, non-tender and normal bowel sounds Musculoskeletal:no cyanosis of digits and no clubbing  NEURO: alert & oriented x 3 with fluent speech, no focal motor/sensory deficits  LABORATORY DATA:  I have reviewed the data as listed    Component Value Date/Time   NA 138 10/07/2019 0737   K 4.1 10/07/2019 0737   CL 105 10/07/2019 0737   CO2 25 10/07/2019 0737   GLUCOSE 174 (H) 10/07/2019 0737   BUN 10 10/07/2019 0737   CREATININE 1.05 (H) 10/07/2019 0737   CALCIUM 9.8 10/07/2019 0737   PROT 6.3 (L) 10/07/2019 0737   ALBUMIN 3.3 (L) 10/07/2019 0737   AST 14 (L) 10/07/2019 0737   ALT <6 10/07/2019 0737   ALKPHOS 77  10/07/2019 0737   BILITOT 0.4 10/07/2019 0737   GFRNONAA >60 10/07/2019 0737   GFRAA >60 10/07/2019 0737    No results found for: SPEP, UPEP  Lab Results  Component Value Date   WBC 3.9 (L) 10/07/2019   NEUTROABS 3.3 10/07/2019   HGB 8.0 (L) 10/07/2019   HCT 24.1 (L) 10/07/2019   MCV 100.0 10/07/2019   PLT 76 (L) 10/07/2019      Chemistry      Component Value Date/Time   NA 138 10/07/2019 0737   K 4.1 10/07/2019 0737   CL 105 10/07/2019 0737   CO2 25 10/07/2019 0737   BUN 10 10/07/2019 0737   CREATININE 1.05 (H) 10/07/2019 0737      Component Value Date/Time   CALCIUM 9.8 10/07/2019 0737   ALKPHOS 77 10/07/2019 0737   AST 14 (L) 10/07/2019 0737   ALT <6 10/07/2019 0737   BILITOT 0.4 10/07/2019 0737

## 2019-10-07 NOTE — Assessment & Plan Note (Signed)
She has significant pancytopenia due to treatment Due to mild neuropathy, I also plan to reduce the dose of Taxol little bit We will proceed with treatment without delay I recommend a unit of blood due to symptomatic anemia and she is in agreement We discussed some of the risks, benefits, and alternatives of blood transfusions. The patient is symptomatic from anemia and the hemoglobin level is critically low.  Some of the side-effects to be expected including risks of transfusion reactions, chills, infection, syndrome of volume overload and risk of hospitalization from various reasons and the patient is willing to proceed and went ahead to sign consent today.

## 2019-10-07 NOTE — Assessment & Plan Note (Signed)
she has mild peripheral neuropathy, likely related to side effects of treatment. I plan to reduce the dose of treatment as outlined above.   

## 2019-10-07 NOTE — Assessment & Plan Note (Signed)
Her intermittent right lower extremity edema is related to abdominal disease She is using elastic compression hose I recommend physical therapy and she is in agreement

## 2019-10-07 NOTE — Patient Instructions (Addendum)
[facility] Discharge Instructions for Patients Receiving Chemotherapy  Today you received the following chemotherapy agents: PACLITAXEL AND CARBOPLATIN.  To help prevent nausea and vomiting after your treatment, we encourage you to take your nausea medication as directed.   If you develop nausea and vomiting that is not controlled by your nausea medication, call the clinic.   BELOW ARE SYMPTOMS THAT SHOULD BE REPORTED IMMEDIATELY:  *FEVER GREATER THAN 100.5 F  *CHILLS WITH OR WITHOUT FEVER  NAUSEA AND VOMITING THAT IS NOT CONTROLLED WITH YOUR NAUSEA MEDICATION  *UNUSUAL SHORTNESS OF BREATH  *UNUSUAL BRUISING OR BLEEDING  TENDERNESS IN MOUTH AND THROAT WITH OR WITHOUT PRESENCE OF ULCERS  *URINARY PROBLEMS  *BOWEL PROBLEMS  UNUSUAL RASH Items with * indicate a potential emergency and should be followed up as soon as possible.  Feel free to call the clinic should you have any questions or concerns. The clinic phone number is [phone].  Please show the [facility] at check-in to the Emergency Department and triage nurse.  Blood Transfusion, Adult A blood transfusion is a procedure in which you receive blood or a type of blood cell (blood component) through an IV. You may need a blood transfusion when your blood level is low. This may result from a bleeding disorder, illness, injury, or surgery. The blood may come from a donor. You may also be able to donate blood for yourself (autologous blood donation) before a planned surgery. The blood given in a transfusion is made up of different blood components. You may receive:  Red blood cells. These carry oxygen to the cells in the body.  Platelets. These help your blood to clot.  Plasma. This is the liquid part of your blood. It carries proteins and other substances throughout the body.  White blood cells. These help you fight infections. If you have hemophilia or another clotting disorder, you may also  receive other types of blood products. Tell a health care provider about:  Any blood disorders you have.  Any previous reactions you have had during a blood transfusion.  Any allergies you have.  All medicines you are taking, including vitamins, herbs, eye drops, creams, and over-the-counter medicines.  Any surgeries you have had.  Any medical conditions you have, including any recent fever or cold symptoms.  Whether you are pregnant or may be pregnant. What are the risks? Generally, this is a safe procedure. However, problems may occur.  The most common problems include: ? A mild allergic reaction, such as red, swollen areas of skin (hives) and itching. ? Fever or chills. This may be the body's response to new blood cells received. This may occur during or up to 4 hours after the transfusion.  More serious problems may include: ? Transfusion-associated circulatory overload (TACO), or too much fluid in the lungs. This may cause breathing problems. ? A serious allergic reaction, such as difficulty breathing or swelling around the face and lips. ? Transfusion-related acute lung injury (TRALI), which causes breathing difficulty and low oxygen in the blood. This can occur within hours of the transfusion or several days later. ? Iron overload. This can happen after receiving many blood transfusions over a period of time. ? Infection or virus being transmitted. This is rare because donated blood is carefully tested before it is given. ? Hemolytic transfusion reaction. This is rare. It happens when your body's defense system (immune system)tries to attack the new blood cells. Symptoms may include fever, chills, nausea, low blood pressure,  and low back or chest pain. ? Transfusion-associated graft-versus-host disease (TAGVHD). This is rare. It happens when donated cells attack your body's healthy tissues. What happens before the procedure? Medicines Ask your health care provider  about:  Changing or stopping your regular medicines. This is especially important if you are taking diabetes medicines or blood thinners.  Taking medicines such as aspirin and ibuprofen. These medicines can thin your blood. Do not take these medicines unless your health care provider tells you to take them.  Taking over-the-counter medicines, vitamins, herbs, and supplements. General instructions  Follow instructions from your health care provider about eating and drinking restrictions.  You will have a blood test to determine your blood type. This is necessary to know what kind of blood your body will accept and to match it to the donor blood.  If you are going to have a planned surgery, you may be able to do an autologous blood donation. This may be done in case you need to have a transfusion.  You will have your temperature, blood pressure, and pulse monitored before the transfusion.  If you have had an allergic reaction to a transfusion in the past, you may be given medicine to help prevent a reaction. This medicine may be given to you by mouth (orally) or through an IV.  Set aside time for the blood transfusion. This procedure generally takes 1-4 hours to complete. What happens during the procedure?   An IV will be inserted into one of your veins.  The bag of donated blood will be attached to your IV. The blood will then enter through your vein.  Your temperature, blood pressure, and pulse will be monitored regularly during the transfusion. This monitoring is done to detect early signs of a transfusion reaction.  Tell your nurse right away if you have any of these symptoms during the transfusion: ? Shortness of breath or trouble breathing. ? Chest or back pain. ? Fever or chills. ? Hives or itching.  If you have any signs or symptoms of a reaction, your transfusion will be stopped and you may be given medicine.  When the transfusion is complete, your IV will be  removed.  Pressure may be applied to the IV site for a few minutes.  A bandage (dressing)will be applied. The procedure may vary among health care providers and hospitals. What happens after the procedure?  Your temperature, blood pressure, pulse, breathing rate, and blood oxygen level will be monitored until you leave the hospital or clinic.  Your blood may be tested to see how you are responding to the transfusion.  You may be warmed with fluids or blankets to maintain a normal body temperature.  If you receive your blood transfusion in an outpatient setting, you will be told whom to contact to report any reactions. Where to find more information For more information on blood transfusions, visit the [employer]: redcross.org Summary  A blood transfusion is a procedure in which you receive blood or a type of blood cell (blood component) through an IV.  The blood you receive may come from a donor or be donated by yourself (autologous blood donation) before a planned surgery.  The blood given in a transfusion is made up of different blood components. You may receive red blood cells, platelets, plasma, or white blood cells depending on the condition treated.  Your temperature, blood pressure, and pulse will be monitored before, during, and after the transfusion.  After the transfusion, your blood may  be tested to see how your body has responded. This information is not intended to replace advice given to you by your health care provider. Make sure you discuss any questions you have with your health care provider. Document Revised: 07/23/2018 Document Reviewed: 07/23/2018 Elsevier Patient Education  [place].

## 2019-10-08 LAB — TYPE AND SCREEN
ABO/RH(D): B POS
Antibody Screen: NEGATIVE
Unit division: 0

## 2019-10-08 LAB — BPAM RBC
Blood Product Expiration Date: 202108272359
ISSUE DATE / TIME: 202108261406
Unit Type and Rh: 1700

## 2019-10-14 ENCOUNTER — Other Ambulatory Visit: Payer: Self-pay

## 2019-10-14 ENCOUNTER — Ambulatory Visit (HOSPITAL_COMMUNITY)
Admission: RE | Admit: 2019-10-14 | Discharge: 2019-10-14 | Disposition: A | Discharge status: Home / Self Care | Payer: Medicaid Other | Source: Ambulatory Visit | Attending: Hematology and Oncology | Admitting: Hematology and Oncology

## 2019-10-14 DIAGNOSIS — C530 Malignant neoplasm of endocervix: Secondary | ICD-10-CM | POA: Diagnosis present

## 2019-10-14 MED ORDER — IOHEXOL 300 MG/ML  SOLN
100.0000 mL | Freq: Once | INTRAMUSCULAR | Status: AC | PRN
  Administered 2019-10-14: 100 mL via INTRAVENOUS

## 2019-10-15 ENCOUNTER — Inpatient Hospital Stay: Payer: Medicaid Other

## 2019-10-15 ENCOUNTER — Inpatient Hospital Stay (HOSPITAL_BASED_OUTPATIENT_CLINIC_OR_DEPARTMENT_OTHER): Payer: Medicaid Other | Admitting: Hematology and Oncology

## 2019-10-15 ENCOUNTER — Telehealth: Payer: Self-pay

## 2019-10-15 ENCOUNTER — Other Ambulatory Visit: Payer: Self-pay

## 2019-10-15 ENCOUNTER — Inpatient Hospital Stay: Payer: Medicaid Other | Attending: Hematology and Oncology

## 2019-10-15 ENCOUNTER — Encounter: Payer: Self-pay | Admitting: Hematology and Oncology

## 2019-10-15 VITALS — BP 105/71 | HR 97 | Temp 99.0°F | Resp 18 | Ht 62.0 in | Wt 143.0 lb

## 2019-10-15 DIAGNOSIS — Z7189 Other specified counseling: Secondary | ICD-10-CM

## 2019-10-15 DIAGNOSIS — G893 Neoplasm related pain (acute) (chronic): Secondary | ICD-10-CM | POA: Diagnosis not present

## 2019-10-15 DIAGNOSIS — C530 Malignant neoplasm of endocervix: Secondary | ICD-10-CM | POA: Insufficient documentation

## 2019-10-15 DIAGNOSIS — D61818 Other pancytopenia: Secondary | ICD-10-CM | POA: Diagnosis not present

## 2019-10-15 DIAGNOSIS — Z79899 Other long term (current) drug therapy: Secondary | ICD-10-CM | POA: Diagnosis not present

## 2019-10-15 DIAGNOSIS — Z23 Encounter for immunization: Secondary | ICD-10-CM | POA: Insufficient documentation

## 2019-10-15 DIAGNOSIS — T451X5A Adverse effect of antineoplastic and immunosuppressive drugs, initial encounter: Secondary | ICD-10-CM

## 2019-10-15 DIAGNOSIS — N1330 Unspecified hydronephrosis: Secondary | ICD-10-CM

## 2019-10-15 DIAGNOSIS — Z5111 Encounter for antineoplastic chemotherapy: Secondary | ICD-10-CM | POA: Insufficient documentation

## 2019-10-15 LAB — CMP (CANCER CENTER ONLY)
ALT: 10 U/L (ref 0–44)
AST: 18 U/L (ref 15–41)
Albumin: 3.3 g/dL — ABNORMAL LOW (ref 3.5–5.0)
Alkaline Phosphatase: 82 U/L (ref 38–126)
Anion gap: 8 (ref 5–15)
BUN: 11 mg/dL (ref 6–20)
CO2: 28 mmol/L (ref 22–32)
Calcium: 9.2 mg/dL (ref 8.9–10.3)
Chloride: 101 mmol/L (ref 98–111)
Creatinine: 1.01 mg/dL — ABNORMAL HIGH (ref 0.44–1.00)
GFR, Est AFR Am: >60 mL/min (ref >60)
GFR, Estimated: >60 mL/min (ref >60)
Glucose, Bld: 125 mg/dL — ABNORMAL HIGH (ref 70–99)
Potassium: 3.9 mmol/L (ref 3.5–5.1)
Sodium: 137 mmol/L (ref 135–145)
Total Bilirubin: 0.3 mg/dL (ref 0.3–1.2)
Total Protein: 6.2 g/dL — ABNORMAL LOW (ref 6.5–8.1)

## 2019-10-15 LAB — CBC WITH DIFFERENTIAL (CANCER CENTER ONLY)
Abs Immature Granulocytes: 0.02 10*3/uL (ref 0.00–0.07)
Basophils Absolute: 0 10*3/uL (ref 0.0–0.1)
Basophils Relative: 0 %
Eosinophils Absolute: 0.1 10*3/uL (ref 0.0–0.5)
Eosinophils Relative: 4 %
HCT: 24.8 % — ABNORMAL LOW (ref 36.0–46.0)
Hemoglobin: 8.2 g/dL — ABNORMAL LOW (ref 12.0–15.0)
Immature Granulocytes: 1 %
Lymphocytes Relative: 18 %
Lymphs Abs: 0.5 10*3/uL — ABNORMAL LOW (ref 0.7–4.0)
MCH: 31.9 pg (ref 26.0–34.0)
MCHC: 33.1 g/dL (ref 30.0–36.0)
MCV: 96.5 fL (ref 80.0–100.0)
Monocytes Absolute: 0.2 10*3/uL (ref 0.1–1.0)
Monocytes Relative: 6 %
Neutro Abs: 1.9 10*3/uL (ref 1.7–7.7)
Neutrophils Relative %: 71 %
Platelet Count: 41 10*3/uL — ABNORMAL LOW (ref 150–400)
RBC: 2.57 MIL/uL — ABNORMAL LOW (ref 3.87–5.11)
RDW: 17.8 % — ABNORMAL HIGH (ref 11.5–15.5)
WBC Count: 2.7 10*3/uL — ABNORMAL LOW (ref 4.0–10.5)
nRBC: 0 % (ref 0.0–0.2)

## 2019-10-15 LAB — SAMPLE TO BLOOD BANK

## 2019-10-15 NOTE — Progress Notes (Signed)
DISCONTINUE OFF PATHWAY REGIMEN - Other   [id number]:Carboplatin + Paclitaxel (5/175) q21 Days:   A cycle is every 21 days:     Paclitaxel      Carboplatin   **Always confirm dose/schedule in your pharmacy ordering system**  REASON: Toxicities / Adverse Event PRIOR TREATMENT: Carboplatin + Paclitaxel (5/175) q21 Days TREATMENT RESPONSE: Stable Disease (SD)  START OFF PATHWAY REGIMEN - Other   [id number]:Pembrolizumab 200 mg IV D1 q21 Days:   A cycle is every 21 days:     Pembrolizumab   **Always confirm dose/schedule in your pharmacy ordering system**  Patient Characteristics: Intent of Therapy: Non-Curative / Palliative Intent, Discussed with Patient

## 2019-10-15 NOTE — Progress Notes (Signed)
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[facility]) I reviewed multiple imaging studies with the patient and her son's Overall, I felt that she have stable disease I am concerned about severe recurrent pancytopenia requiring blood transfusion In my opinion, she had maximized the benefit from carboplatin and paclitaxel right now I recommend switching her treatment to immunotherapy as a form of maintenance treatment PD-L1 testing was positive We discussed the risk, benefits, side effects of treatment and she is in agreement to proceed We will keep her appointment as scheduled but the change her treatment from carboplatin and Taxol to pembrolizumab  Pancytopenia, acquired ([place]) This is multifactorial, secondary to side effects of treatment She is not symptomatic today  She does not need transfusion support today I anticipate her blood count will return back to normal with time away from treatment  Cancer associated pain Her pain is well controlled She will continue prescribed pain medicine  Bilateral hydronephrosis Renal function is stable She will continue to have stent exchange every few months per urologist   Orders Placed This Encounter  Procedures  . CBC with Differential (Cancer Center Only)    Standing Status:   Standing    Number of Occurrences:   20    Standing Expiration Date:   10/14/2020  . CMP ([place] only)    Standing Status:   Standing    Number of Occurrences:   20    Standing Expiration Date:   10/14/2020  . T4    Standing Status:   Standing    Number of Occurrences:   20    Standing Expiration Date:   10/14/2020  . TSH    Standing Status:   Standing    Number of Occurrences:   20    Standing Expiration Date:   10/14/2020    All questions were answered. The  patient knows to call the clinic with any problems, questions or concerns. The total time spent in the appointment was 50 minutes encounter with patients including review of chart and various tests results, discussions about plan of care and coordination of care plan   Sheena Lark, MD 10/15/2019 3:12 PM  INTERVAL HISTORY: Please see below for problem oriented charting. She returns with her sons for further follow-up She is doing well She is urinating better She denies recent hematuria or hematochezia Her energy level is fair Her pain is well controlled Her leg swelling is less  SUMMARY OF ONCOLOGIC HISTORY: Oncology History Overview Note  PD-L1 5%   Malignant neoplasm of endocervix ([place])  09/15/2017 Initial Diagnosis   The patient reports postcoital bleeding since August, 2019 and postmenopausal bleeding since November, 2019. 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    04/16/2018 Surgery   She was taken to the OR on 04/16/18 for a cervical cone biopsy. The specimen revealed a grossly normal cervix on the ectocervix.  Cone specimen revealed squamous cell carcinoma.  When discussing this with the pathologist he reported that in the actual cone specimen itself that was CIN 3 however there was a separate fragment of tissue that included carcinoma.  The dimensions of this was 0.6 cm, however this involved the margins.  A post cone ECC was positive for  squamous cell carcinoma, as well as an endometrial curettage which also contain benign endometrial glands.    04/16/2018 Pathology Results   Endocervix curettage: [facility]   04/22/2018 Initial Diagnosis   Malignant neoplasm of endocervix ([place])   04/24/2018 PET scan   1. There is a large mass involving the cervix and endometrium which has a maximum dimension of 5.3 cm within SUV max of 20.4. 2. Small bilateral pelvic sidewall lymph nodes measuring less than 1cm with mild  nonspecific uptake. The no hypermetabolic adenopathy or evidence of distant metastatic disease. 3. Nonspecific pulmonary nodules measuring less than 5 mm are identified in the right lung. Too small to characterize by PET-CT.    04/27/2018 Imaging   MRI pelvis  1. Uterine cervix 5.5 x 3.4 x 3.2 cm mass compatible with primary cervical malignancy, with mild parametrial invasion. Stage IIB by MRI. 2. Small volume simple free fluid in the pelvic cul-de-sac. 3. No pathologically enlarged pelvic lymph nodes, see comments.   05/01/2018 Cancer Staging   Staging form: Cervix Uteri, AJCC 8th Edition - Clinical: FIGO Stage IIB (cT2b, cN0, cM0) - Signed by Sheena Lark, MD on 05/01/2018   05/08/2018 Procedure   Placement of a subcutaneous port device. Catheter tip at the superior cavoatrial junction.   05/14/2018 - 07/27/2018 Radiation Therapy   Radiation treatment dates:    1. 05/14/2018 - 06/24/2018 2. 5/21, 5/28, 6/1, 6/9, 07/27/2018   Site/dose:    1.         A. pelvsi / 45 Gy in 25 fractions              B. Parametrial  boost / 9 Gy in 5 fractions 2. Cervix, Tandem and Ring System / 27.5 Gy in 5 fractions     05/15/2018 - 06/26/2018 Chemotherapy   The patient had weekly cisplatin   10/27/2018 PET scan   Near complete resolution of FDG uptake at site of previously seen cervical mass. Resolution of previously seen sub-cm pelvic lymph nodes. No evidence of metabolically-active metastatic disease.   05/13/2019 Imaging   1. There is ill-defined, hypoenhancing soft tissue most clearly visualized at the left aspect of the uterus and adnexa, which obstructs the distal left ureter, measuring approximately 4.0 x 3.4 cm. This is concerning for local recurrence of cervical malignancy.   2. Ill-defined, hypoenhancing appearance of the cervix with loss of distinction of the fat plane to the posterior urinary bladder, in keeping with primary malignancy.   3. Interval enlargement of numerous abnormal retroperitoneal  and bilateral inguinal lymph nodes, consistent with nodal metastatic disease.    4. There is new moderate left, mild right hydronephrosis and hydroureter, with soft tissue described above obstructing the distal left ureter in the vicinity of the adnexa. There is high-grade obstruction of the distal left ureter with no excreted contrast on delayed phase imaging. Point of obstruction of the right ureter is not clearly visualized, possibly at the ureterovesicular junction.      Genetic Testing   Patient has genetic testing done for PD-L1. Results revealed patient has the following: PD-L1 staining in tumor cells (TC): 5% PD-L1 staining in tumor associated immune cells (IC): <1% PD-L1 combined positive score (CPS): 5%   05/28/2019 -  Chemotherapy   The patient had carboplatin and taxol for chemotherapy treatment.     07/29/2019 Imaging   1. Mild improvement in mild left paraaortic retroperitoneal lymphadenopathy. No new or progressive metastatic disease identified. 2. No significant change in size of soft tissue mass  in the left adnexa and parametrium. 3. Stable diffuse bladder wall thickening, consistent with cystitis and likely due to previous radiation therapy. 4. Interval placement of bilateral ureteral stents, with resolution of bilateral hydroureteronephrosis since prior study. 5. Stable diffuse left renal parenchymal atrophy. 6. Large stool burden noted; recommend clinical correlation for possible constipation.   09/01/2019 Surgery   Operative Note   Preoperative diagnosis:  1.  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  Same   Procedure(s): 1.  Cystoscopy with bilateral stent exchange 2.  Bilateral retrograde pyelograms with intraoperative interpretation of fluoroscopic imaging   Surgeon: Ellison Hughs, MD    Specimens: 1.  Previously placed bilateral JJ stents were removed intact, inspected and discarded    Drains/Catheters: 1.  Bilateral 6 French, 24 cm Polaris stents without tethers   Intraoperative findings:   1. Right retrograde pyelogram revealed dilation starting in the mid to proximal aspects of the right ureter, extending up to the right renal pelvis.  No distal filling defects were identified. 2. Left retrograde pyelogram revealed dilation starting in the mid to proximal aspects of the left ureter, extending up to the left renal pelvis.  No distal filling defects were identified   10/14/2019 Imaging   1. Small RIGHT effusion increased from the previous exam with some suggestion of early diaphragmatic nodularity and with soft tissue nodules or small lymph nodes in the cardiophrenic recess, this along with thoracic inlet adenopathy compatible with disease in the chest. 2. Increase in abdominal ascites and subjective increase in fascial thickening in the lower abdomen and pelvis may reflect worsening of disease in this area. Attention on follow-up. 3. Accurate measurements are difficult with respect adnexal structures, increased fullness of the RIGHT adnexa since previous imaging. 4. Retroperitoneal lymph nodes with similar appearance. Stranding about retroperitoneal vessels with some increase. 5. Signs of presumed cystitis with bilateral nephroureteral stents in place not significantly changed from the previous study. 6. Post radiation changes about the rectum. Slightly indistinct appearance of the distal colon along the descending and sigmoid favored to represent increasing serosal involvement in the setting of peritoneal disease. Correlate with any clinical evidence of colitis. 7. Aortic atherosclerosis.   10/28/2019 -  Chemotherapy   The patient had pembrolizumab (KEYTRUDA) 200 mg in sodium chloride 0.9 % 50 mL chemo infusion, 200 mg, Intravenous, Once, 0 of 6 cycles  for chemotherapy treatment.      REVIEW OF SYSTEMS:   Constitutional: Denies fevers, chills or abnormal weight  loss Eyes: Denies blurriness of vision Ears, nose, mouth, throat, and face: Denies mucositis or sore throat Respiratory: Denies cough, dyspnea or wheezes Cardiovascular: Denies palpitation, chest discomfort or lower extremity swelling Gastrointestinal:  Denies nausea, heartburn or change in bowel habits Skin: Denies abnormal skin rashes Lymphatics: Denies new lymphadenopathy or easy bruising Neurological:Denies numbness, tingling or new weaknesses Behavioral/Psych: Mood is stable, no new changes  All other systems were reviewed with the patient and are negative.  I have reviewed the past medical history, past surgical history, social history and family history with the patient and they are unchanged from previous note.  ALLERGIES:  has No Known Allergies.  MEDICATIONS:  Current Outpatient Medications  Medication Sig Dispense Refill  . acetaminophen (TYLENOL) 325 MG tablet Take 650 mg by mouth every 6 (six) hours as needed. 08-23-2019 Pt takes one tablet twice daily with one percocet    . dexamethasone (DECADRON) 4 MG tablet Take 1 tablet (4 mg total) by  mouth 2 (two) times daily. 36 tablet 0  . methadone (DOLOPHINE) 10 MG tablet Take 1 tablet (10 mg total) by mouth every 12 (twelve) hours. 30 tablet 0  . ondansetron (ZOFRAN) 8 MG tablet Take 1 tablet (8 mg total) by mouth 2 (two) times daily as needed for refractory nausea / vomiting. Start on day 3 after carboplatin chemo. 30 tablet 1  . oxybutynin (DITROPAN) 5 MG tablet Take 1 tablet (5 mg total) by mouth every 8 (eight) hours as needed for bladder spasms. (Patient taking differently: Take 5 mg by mouth every 8 (eight) hours as needed for bladder spasms. 08-23-2019 Per pt is taking on regular basis one tablet twice daily) 30 tablet 1  . oxyCODONE-acetaminophen (PERCOCET/ROXICET) 5-325 MG tablet Take 2 tablets by mouth every 6 (six) hours as needed for severe pain. 90 tablet 0  . prochlorperazine (COMPAZINE) 10 MG tablet Take 1 tablet (10  mg total) by mouth every 6 (six) hours as needed (Nausea or vomiting). 30 tablet 1  . tamsulosin (FLOMAX) 0.4 MG CAPS capsule Take 0.4 mg by mouth 2 (two) times daily.     No current facility-administered medications for this visit.    PHYSICAL EXAMINATION: ECOG PERFORMANCE STATUS: 1 - Symptomatic but completely ambulatory  Vitals:   10/15/19 1216  BP: 105/71  Pulse: 97  Resp: 18  Temp: 99 F (37.2 C)  SpO2: 100%   Filed Weights   10/15/19 1216  Weight: 143 lb (64.9 kg)    GENERAL:alert, no distress and comfortable NEURO: alert & oriented x 3 with fluent speech, no focal motor/sensory deficits  LABORATORY DATA:  I have reviewed the data as listed    Component Value Date/Time   NA 137 10/15/2019 1144   K 3.9 10/15/2019 1144   CL 101 10/15/2019 1144   CO2 28 10/15/2019 1144   GLUCOSE 125 (H) 10/15/2019 1144   BUN 11 10/15/2019 1144   CREATININE 1.01 (H) 10/15/2019 1144   CALCIUM 9.2 10/15/2019 1144   PROT 6.2 (L) 10/15/2019 1144   ALBUMIN 3.3 (L) 10/15/2019 1144   AST 18 10/15/2019 1144   ALT 10 10/15/2019 1144   ALKPHOS 82 10/15/2019 1144   BILITOT 0.3 10/15/2019 1144   GFRNONAA >60 10/15/2019 1144   GFRAA >60 10/15/2019 1144    No results found for: SPEP, UPEP  Lab Results  Component Value Date   WBC 2.7 (L) 10/15/2019   NEUTROABS 1.9 10/15/2019   HGB 8.2 (L) 10/15/2019   HCT 24.8 (L) 10/15/2019   MCV 96.5 10/15/2019   PLT 41 (L) 10/15/2019      Chemistry      Component Value Date/Time   NA 137 10/15/2019 1144   K 3.9 10/15/2019 1144   CL 101 10/15/2019 1144   CO2 28 10/15/2019 1144   BUN 11 10/15/2019 1144   CREATININE 1.01 (H) 10/15/2019 1144      Component Value Date/Time   CALCIUM 9.2 10/15/2019 1144   ALKPHOS 82 10/15/2019 1144   AST 18 10/15/2019 1144   ALT 10 10/15/2019 1144   BILITOT 0.3 10/15/2019 1144       RADIOGRAPHIC STUDIES: I have reviewed multiple imaging studies with the patient I have personally reviewed the  radiological images as listed and agreed with the findings in the report. CT CHEST ABDOMEN PELVIS W CONTRAST  Result Date: 10/14/2019 CLINICAL DATA:  Metastatic cervical cancer assess treatment response. EXAM: CT CHEST, ABDOMEN, AND PELVIS WITH CONTRAST TECHNIQUE: Multidetector CT imaging of the chest,  abdomen and pelvis was performed following the standard protocol during bolus administration of intravenous contrast. CONTRAST:  146m OMNIPAQUE IOHEXOL 300 MG/ML  SOLN COMPARISON:  July 29, 2019 FINDINGS: CT CHEST FINDINGS Cardiovascular: Is normal caliber thoracic aorta. Normal heart size. No pericardial effusion. There is some nodularity in the RIGHT cardiophrenic recess, largest approximately 7 mm in the pre pericardial fat likely small lymph nodes without significant change from previous imaging. Central pulmonary vasculature unremarkable on venous phase assessment. Mediastinum/Nodes: No axillary lymphadenopathy. No mediastinal lymphadenopathy with scattered small nodes throughout the mediastinum, none with pathologic enlargement. No hilar lymphadenopathy. Mild enlargement of high RIGHT paratracheal lymph node (image 10, series 2) 6 mm. Group of lymph nodes just at the RIGHT thoracic inlet (image 6, series 2) largest 11 mm. Group of lymph nodes at the LEFT thoracic inlet also with mild enlargement, largest approximately 7-8 mm. Lungs/Pleura: Small RIGHT pleural effusion perhaps trace effusion on previous imaging. Very subtle nodularity along the surface of the RIGHT hemidiaphragm tracking towards the cardiophrenic recess best seen on sagittal image 53 of series 6. Granuloma with calcification in the RIGHT upper lobe. Calcified granuloma also in the RIGHT middle lobe. Airways are patent. No consolidation. Musculoskeletal: No acute bone finding related to the bony thorax. No destructive bone lesion. Bilateral breast implants in place. CT ABDOMEN PELVIS FINDINGS Hepatobiliary: No focal, suspicious hepatic lesion.  Studding of the anterior surface of the LEFT hemi liver is similar to the prior exam. No focal parenchymal hepatic lesion. The gallbladder is collapsed. No biliary duct distension. Pancreas: Pancreas is normal without ductal dilation or signs of inflammation. Spleen: Spleen normal in size and contour. Adrenals/Urinary Tract: Adrenal glands are normal. LEFT-sided and RIGHT-sided nephroureteral stents remain in place. LEFT renal atrophy. No hydronephrosis. Soft tissue along the course of the distal ureters with similar appearance. Persistent marked bladder wall thickening. Stents, distal aspects in stable position. Stomach/Bowel: No acute gastrointestinal process. Serosal disease about bowel loops in the pelvis and nodularity and small volume ascites along the RIGHT pericolic gutter with subjective increase. There is definitive fluid with nodularity along the RIGHT pericolic gutter along the inferior tip of the RIGHT hepatic lobe best seen on image 73 of series 2. Mesenteric thickening with increases is subjective finding. Indistinct margins of the descending colon raising the question of serosal disease in this area as well. Gastric distension filled with ingested contrast material. Vascular/Lymphatic: No aneurysmal dilation of the abdominal aorta. Stranding about retroperitoneal vessels with some increase. LEFT retroperitoneal lymph nodes with similar appearance, largest approximately 11 mm as compared to 13 mm on the previous study along the LEFT periaortic chain. Intra-aortocaval lymph nodes while smaller numerous and some rounded, unchanged from the previous study. No discrete pelvic adenopathy.  Extensive pelvic fascial thickening. Reproductive: Endometrial canal slightly more distended than on the previous study. Ill-defined soft tissue and fluid about the bilateral adnexa with similar appearance. A measurable site of disease is not present. RIGHT adnexa measuring 3.4 x 2.8 cm, when measured similarly 2.6 x 2.6  cm. Area of the LEFT adnexa is grossly similar to the prior study. Indistinct margins do not allow for accurate measurement. Thickening of the mesorectum and stranding about the rectum with similar appearance. Other: Slight increase in ascites best seen along the RIGHT flank beneath the RIGHT hemi liver. Musculoskeletal: No acute musculoskeletal process. No destructive bone finding. IMPRESSION: 1. Small RIGHT effusion increased from the previous exam with some suggestion of early diaphragmatic nodularity and with soft tissue nodules or  small lymph nodes in the cardiophrenic recess, this along with thoracic inlet adenopathy compatible with disease in the chest. 2. Increase in abdominal ascites and subjective increase in fascial thickening in the lower abdomen and pelvis may reflect worsening of disease in this area. Attention on follow-up. 3. Accurate measurements are difficult with respect adnexal structures, increased fullness of the RIGHT adnexa since previous imaging. 4. Retroperitoneal lymph nodes with similar appearance. Stranding about retroperitoneal vessels with some increase. 5. Signs of presumed cystitis with bilateral nephroureteral stents in place not significantly changed from the previous study. 6. Post radiation changes about the rectum. Slightly indistinct appearance of the distal colon along the descending and sigmoid favored to represent increasing serosal involvement in the setting of peritoneal disease. Correlate with any clinical evidence of colitis. 7. Aortic atherosclerosis. These results will be called to the ordering clinician or representative by the Radiologist Assistant, and communication documented in the PACS or [employer]. Aortic Atherosclerosis (ICD10-I70.0). Electronically Signed   By: Zetta Bills M.D.   On: 10/14/2019 17:41

## 2019-10-15 NOTE — Assessment & Plan Note (Signed)
I reviewed multiple imaging studies with the patient and her son's Overall, I felt that she have stable disease I am concerned about severe recurrent pancytopenia requiring blood transfusion In my opinion, she had maximized the benefit from carboplatin and paclitaxel right now I recommend switching her treatment to immunotherapy as a form of maintenance treatment PD-L1 testing was positive We discussed the risk, benefits, side effects of treatment and she is in agreement to proceed We will keep her appointment as scheduled but the change her treatment from carboplatin and Taxol to pembrolizumab 

## 2019-10-15 NOTE — Telephone Encounter (Signed)
Call report from [facility] Radiology CT Chest Abdomen Pelvis spoke with Malachy Mood. Informed to make Dr. Alvy Bimler aware of Impression 1 and 2. Dr Alvy Bimler notified.

## 2019-10-15 NOTE — Assessment & Plan Note (Signed)
Her pain is well controlled She will continue prescribed pain medicine

## 2019-10-15 NOTE — Assessment & Plan Note (Signed)
Renal function is stable She will continue to have stent exchange every few months per urologist

## 2019-10-15 NOTE — Assessment & Plan Note (Addendum)
This is multifactorial, secondary to side effects of treatment She is not symptomatic today  She does not need transfusion support today I anticipate her blood count will return back to normal with time away from treatment

## 2019-10-20 ENCOUNTER — Other Ambulatory Visit (HOSPITAL_COMMUNITY): Payer: Self-pay | Admitting: Urology

## 2019-10-20 MED FILL — OXYBUTYNIN CHLORIDE 5 MG TA: 5 | 30 days supply | Qty: 90 | Fill #0

## 2019-10-22 NOTE — Progress Notes (Signed)
Pharmacist Chemotherapy Monitoring - Initial Assessment    Anticipated start date: 10/28/19  Regimen:  . Are orders appropriate based on the patient's diagnosis, regimen, and cycle? Yes . Does the plan date match the patient's scheduled date? Yes . Is the sequencing of drugs appropriate? Yes . Are the premedications appropriate for the patient's regimen? Yes . Prior Authorization for treatment is: Approved o If applicable, is the correct biosimilar selected based on the patient's insurance? not applicable  Organ Function and Labs: Marland Kitchen Are dose adjustments needed based on the patient's renal function, hepatic function, or hematologic function? No . Are appropriate labs ordered prior to the start of patient's treatment? Yes . Other organ system assessment, if indicated: N/A . The following baseline labs, if indicated, have been ordered: pembrolizumab: baseline TSH +/- T4  Dose Assessment: . Are the drug doses appropriate? Yes . Are the following correct: o Drug concentrations Yes o IV fluid compatible with drug Yes o Administration routes Yes o Timing of therapy Yes . If applicable, does the patient have documented access for treatment and/or plans for port-a-cath placement? no . If applicable, have lifetime cumulative doses been properly documented and assessed? yes Lifetime Dose Tracking  . Carboplatin: 2,200 mg = 0.01 % of the maximum lifetime dose of 999,999,999 mg  o   Toxicity Monitoring/Prevention: . The patient has the following take home antiemetics prescribed: Ondansetron, Prochlorperazine and Dexamethasone . The patient has the following take home medications prescribed: N/A . Medication allergies and previous infusion related reactions, if applicable, have been reviewed and addressed. Yes The patient's current medication list has been assessed for drug-drug interactions with their chemotherapy regimen. Pt has Dexamethasone tabs on med list.  Need to check that she is not  using this w/ Keytruda. Order Review: . Are the treatment plan orders signed? Yes . Is the patient scheduled to see a provider prior to their treatment? Yes  I verify that I have reviewed each item in the above checklist and answered each question accordingly.   Kennith Center, Pharm.D., CPP 10/22/2019@12 :32 PM

## 2019-10-26 ENCOUNTER — Ambulatory Visit: Payer: Medicaid Other | Attending: Hematology and Oncology | Admitting: Physical Therapy

## 2019-10-26 ENCOUNTER — Other Ambulatory Visit: Payer: Self-pay

## 2019-10-26 ENCOUNTER — Encounter: Payer: Self-pay | Admitting: Physical Therapy

## 2019-10-26 DIAGNOSIS — I890 Lymphedema, not elsewhere classified: Secondary | ICD-10-CM

## 2019-10-26 DIAGNOSIS — R262 Difficulty in walking, not elsewhere classified: Secondary | ICD-10-CM | POA: Insufficient documentation

## 2019-10-26 DIAGNOSIS — C530 Malignant neoplasm of endocervix: Secondary | ICD-10-CM | POA: Insufficient documentation

## 2019-10-26 NOTE — Therapy (Signed)
[place], [place], [address] Phone: [phone]   Fax:  [phone]  Physical Therapy Evaluation  Patient Details  Name: Sheena Torres MRN: [id number] Date of Birth: 15-Oct-1965 Referring Provider (PT): Alvy Bimler   Encounter Date: 10/26/2019   PT End of Session - 10/26/19 1210    Visit Number 1    Number of Visits 13    Date for PT Re-Evaluation 11/30/19    Authorization Type Medicaid UHC    Authorization - Visit Number 1    Authorization - Number of Visits 27    PT Start Time 8338   pt arrived late   PT Stop Time 1003    PT Time Calculation (min) 48 min    Activity Tolerance Patient tolerated treatment well    Behavior During Therapy [facility] for tasks assessed/performed           Past Medical History:  Diagnosis Date  . ADHD   . Anemia   . Bilateral edema of lower extremity    08-23-2019  per pt wears compression hose  . Bilateral ureteral obstruction    urologist--- dr winter  . Cervical cancer [facility]) oncologist-- dr gorsuch/  dr Sondra Come   Stage IIB, SCC--- chemo 05-14-2018 to 06-26-2018 and started chemo 05-28-2019 weekly;  completed IMRT w/ last 5 at high dose 05-14-2018  to 07-27-2018  . Chemotherapy-induced nausea    08-23-2019  per pt intermittant  . Chronic cystitis   . Chronic pain    due to chemo  . Fatigue   . Hematuria    08-23-2019  per pt intermittant  . IBS (irritable bowel syndrome)    w/ diarrhea  . Migraine   . Nocturia more than twice per night   . Pancytopenia, acquired ([place])   . Port-A-Cath in place 05/08/2018  . Sensation of pressure in bladder area    occasional due to radiation therapy  . Urgency of urination    08-23-2019 mostly during the day    Past Surgical History:  Procedure Laterality Date  . BREAST ENHANCEMENT SURGERY Bilateral 2017   w/ implants  . CESAREAN SECTION  2000  . CYSTOSCOPY WITH STENT PLACEMENT Bilateral 05/26/2019   Procedure: CYSTOSCOPY  WITH BILATERAL  STENT PLACEMENT;  Surgeon: Ceasar Mons, MD;  Location: [facility];  Service: Urology;  Laterality: Bilateral;  . CYSTOSCOPY WITH STENT PLACEMENT Bilateral 09/01/2019   Procedure: CYSTOSCOPY WITH STENT EXCHANGE/ RETROGRADE;  Surgeon: Ceasar Mons, MD;  Location: [facility];  Service: Urology;  Laterality: Bilateral;  ONLY NEEDS 30 MIN  . IR IMAGING GUIDED PORT INSERTION  05/08/2018  . IR REMOVAL TUN ACCESS W/ PORT W/O FL MOD SED  11/16/2018  . OPERATIVE ULTRASOUND N/A 07/02/2018   Procedure: OPERATIVE ULTRASOUND;  Surgeon: Gery Pray, MD;  Location: [facility];  Service: Urology;  Laterality: N/A;  . OPERATIVE ULTRASOUND N/A 07/09/2018   Procedure: OPERATIVE ULTRASOUND;  Surgeon: Gery Pray, MD;  Location: [facility];  Service: Urology;  Laterality: N/A;  . OPERATIVE ULTRASOUND N/A 07/13/2018   Procedure: OPERATIVE ULTRASOUND;  Surgeon: Gery Pray, MD;  Location: [facility];  Service: Urology;  Laterality: N/A;  . OPERATIVE ULTRASOUND N/A 07/21/2018   Procedure: OPERATIVE ULTRASOUND;  Surgeon: Gery Pray, MD;  Location: [facility];  Service: Urology;  Laterality: N/A;  . OPERATIVE ULTRASOUND N/A 07/27/2018   Procedure: OPERATIVE ULTRASOUND;  Surgeon: Gery Pray, MD;  Location: [facility];  Service: Urology;  Laterality: N/A;  . TANDEM RING INSERTION N/A 07/02/2018   Procedure: TANDEM RING INSERTION;  Surgeon: Gery Pray, MD;  Location: [facility];  Service: Urology;  Laterality: N/A;  . TANDEM RING INSERTION N/A 07/09/2018   Procedure: TANDEM RING INSERTION;  Surgeon: Gery Pray, MD;  Location: [facility];  Service: Urology;  Laterality: N/A;  . TANDEM RING INSERTION N/A 07/13/2018   Procedure: TANDEM RING INSERTION;  Surgeon: Gery Pray, MD;  Location: [facility];  Service: Urology;  Laterality: N/A;  .  TANDEM RING INSERTION N/A 07/21/2018   Procedure: TANDEM RING INSERTION;  Surgeon: Gery Pray, MD;  Location: [facility];  Service: Urology;  Laterality: N/A;  . TANDEM RING INSERTION N/A 07/27/2018   Procedure: TANDEM RING INSERTION;  Surgeon: Gery Pray, MD;  Location: [facility];  Service: Urology;  Laterality: N/A;    There were no vitals filed for this visit.    Subjective Assessment - 10/26/19 0917    Subjective I am having swelling in my R leg, foot, ankle and lower belly area. I had new stents in my kidneys in July but I was having swelling before that. It started in May. As the day goes on I get more and more swelling and it gets extremely tight. I have to get up every hour on the hour at night and go to the bathroom. I have figured out I have to lie down for as much as I stand up because it is the only way I can release the fluid.    Pertinent History cervical cancer, stage IIB, radiation 05/14/18-06/24/18, 04/16/18 cervical cone biopsy, Sept 2020 was cancer free, Feb 2021- recurrence - tumour on L side of uterus, another tumor between kidney and bladder, pt recently stopped chemo and switched to immunotherapy,    Patient Stated Goals to get the swelling in the right leg down    Currently in Pain? No/denies    Pain Score 0-No pain              [facility] PT Assessment - 10/26/19 0001      Assessment   Medical Diagnosis cervical cancer    Referring Provider (PT) Alvy Bimler    Onset Date/Surgical Date 04/16/18    Hand Dominance Right    Prior Therapy none      Precautions   Precautions Other (comment)    Precaution Comments RLE Lymphedema      Restrictions   Weight Bearing Restrictions No      Balance Screen   Has the patient fallen in the past 6 months No    Has the patient had a decrease in activity level because of a fear of falling?  No    Is the patient reluctant to leave their home because of a fear of falling?  No      Home Environment    Living Environment Private residence    Living Arrangements Children    Available Help at Discharge Family    Type of [place];Independent with household mobility without device    Vocation On disability    Leisure has not been able throughout chemo- plans to return to walking since she stopped chemo      Cognition   Overall Cognitive Status Within Functional Limits for tasks assessed      Observation/Other Assessments   Observations RLE visibly  more swollen than LLE             LYMPHEDEMA/ONCOLOGY QUESTIONNAIRE - 10/26/19 0001      Type   Cancer Type cervical cancer      Treatment   Active Chemotherapy Treatment No    Past Chemotherapy Treatment Yes    Active Radiation Treatment No    Past Radiation Treatment Yes      What other symptoms do you have   Are you Having Heaviness or Tightness Yes    Are you having Pain Yes    Are you having pitting edema Yes    Body Site RLE     Is it Hard or Difficult finding clothes that fit Yes    Do you have infections No      Lymphedema Assessments   Lymphedema Assessments Lower extremities      Right Lower Extremity Lymphedema   30 cm Proximal to Suprapatella 62 cm    20 cm Proximal to Suprapatella 59 cm    10 cm Proximal to Suprapatella 50.9 cm    At Midpatella/Popliteal Crease 39 cm    30 cm Proximal to Floor at Lateral Plantar Foot 38.2 cm    20 cm Proximal to Floor at Lateral Plantar Foot 32 1    10 cm Proximal to Floor at Lateral Malleoli 22.6 cm    5 cm Proximal to 1st MTP Joint 21.5 cm    Across MTP Joint 22.5 cm    Around Proximal Great Toe 7.4 cm      Left Lower Extremity Lymphedema   30 cm Proximal to Suprapatella 56.5 cm    20 cm Proximal to Suprapatella 52.5 cm    10 cm Proximal to Suprapatella 46 cm    At Midpatella/Popliteal Crease 37.5 cm    30 cm Proximal to Floor at Lateral Plantar Foot 35.8 cm    20 cm Proximal to Floor at Lateral Plantar Foot  28 cm    10 cm Proximal to Floor at Lateral Malleoli 20 cm    5 cm Proximal to 1st MTP Joint 20.5 cm    Across MTP Joint 21.6 cm    Around Proximal Great Toe 6.9 cm                   Outpatient Rehab from 10/26/[facility]  Lymphedema Life Impact Scale Total Score 39.71 %      Objective measurements completed on examination: See above findings.               PT Education - 10/26/19 0927    Education Details anatomy and physiology of lymphatic system, flat knit vs circular knit garments    Person(s) Educated Patient    Methods Explanation    Comprehension Verbalized understanding               PT Long Term Goals - 10/26/19 1215      PT LONG TERM GOAL #1   Title Pt will demonstrate a 3 cm reduction in edema at 20 cm proximal to floor at lateral plantar foot to decrease risk of cellulitis.    Baseline R 32 cm    Time 5    Period Weeks    Status New    Target Date 11/30/19      PT LONG TERM GOAL #2   Title Pt will demonstrate a 5 cm decrease in edema at 20 cm proximal to suprapatella to decrease risk of infection.  Baseline R 59 cm    Time 5    Period Weeks    Status New    Target Date 11/30/19      PT LONG TERM GOAL #3   Title Pt will be independent in management of lymphedema through self MLD and compression garments.    Time 5    Period Weeks    Status New    Target Date 11/30/19      PT LONG TERM GOAL #4   Title Pt would benefit from appropriate day and nightitme compression garments for long term management of lymphedema.    Time 5    Period Weeks    Status New    Target Date 11/30/19      PT LONG TERM GOAL #5   Title Pt would benefit from a trial of FlexiTouch compression pump for long term management of lymphedema.    Time 5    Period Weeks    Status New    Target Date 11/30/19                  Plan - 10/26/19 1105    Clinical Impression Statement Pt presents to PT with RLE  lymphedema following treatment for cervical cancer. Pt was diagnosed in March 2020. She underwent radiation from April to May of 2020. She had resolution of her cancer by Sept 2020 but then had recurrence in Feb 2021. She had stants placed in her kidney to decrease obstruction of ureter by the tumor. She would benefit from skilled PT services to decrease R LE lymphedema through manual lymphatic drainage and compression bandaging. Her doctor has given clearance to begin MLD and bandaging.    Personal Factors and Comorbidities Comorbidity 1    Comorbidities bilateral kidney stents, pancytopenia    Examination-Activity Limitations Locomotion Level    Examination-Participation Restrictions Community Activity;Cleaning;Occupation    Stability/Clinical Decision Making Stable/Uncomplicated    Clinical Decision Making Moderate    Rehab Potential Good    PT Frequency 3x / week    PT Duration --   5 weeks   PT Treatment/Interventions ADLs/Self Care Home Management;Therapeutic exercise;Therapeutic activities;Patient/family education;Manual techniques;Manual lymph drainage;Compression bandaging;Taping;Passive range of motion;Vasopneumatic Device    PT Next Visit Plan begin MLD and compression bandaging (not too tight due to kidney issues)    Consulted and Agree with Plan of Care Patient           Patient will benefit from skilled therapeutic intervention in order to improve the following deficits and impairments:  Increased edema, Pain, Difficulty walking  Visit Diagnosis: Lymphedema, not elsewhere classified  Difficulty in walking, not elsewhere classified  Malignant neoplasm of endocervix [facility])     Problem List Patient Active Problem List   Diagnosis Date Noted  . UTI (urinary tract infection) 07/30/2019  . Edema of right lower extremity 06/18/2019  . Anemia due to antineoplastic chemotherapy 06/18/2019  . Mucositis due to antineoplastic therapy 06/18/2019  . Financial difficulties  05/21/2019  . Bilateral hydronephrosis 05/18/2019  . Peripheral neuropathy due to chemotherapy ([place]) 09/25/2018  . Pancytopenia, acquired ([place]) 06/05/2018  . Dysuria 06/04/2018  . Cancer associated pain 05/01/2018  . Other constipation 05/01/2018  . Physical debility 05/01/2018  . Goals of care, counseling/discussion 05/01/2018  . 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 ADHD (attention deficit hyperactivity disorder), inattentive type 10/11/2015    Allyson Sabal [facility] 10/26/2019, 12:20 PM  [facility]  [place], [place], [address] Phone: [phone]   Fax:  [phone]  Name: DURA MCCORMACK MRN: [id number] Date of Birth: 03-Aug-1965  Manus Gunning, PT 10/26/19 12:21 PM

## 2019-10-28 ENCOUNTER — Inpatient Hospital Stay: Payer: Medicaid Other

## 2019-10-28 ENCOUNTER — Inpatient Hospital Stay (HOSPITAL_BASED_OUTPATIENT_CLINIC_OR_DEPARTMENT_OTHER): Payer: Medicaid Other | Admitting: Hematology and Oncology

## 2019-10-28 ENCOUNTER — Other Ambulatory Visit: Payer: Self-pay

## 2019-10-28 ENCOUNTER — Encounter: Payer: Self-pay | Admitting: Hematology and Oncology

## 2019-10-28 VITALS — BP 117/77 | HR 87 | Temp 98.1°F | Resp 17 | Ht 62.0 in | Wt 149.2 lb

## 2019-10-28 VITALS — BP 101/58 | HR 87 | Temp 97.9°F | Resp 16

## 2019-10-28 DIAGNOSIS — C530 Malignant neoplasm of endocervix: Secondary | ICD-10-CM | POA: Diagnosis not present

## 2019-10-28 DIAGNOSIS — D6481 Anemia due to antineoplastic chemotherapy: Secondary | ICD-10-CM

## 2019-10-28 DIAGNOSIS — D61818 Other pancytopenia: Secondary | ICD-10-CM | POA: Diagnosis not present

## 2019-10-28 DIAGNOSIS — Z23 Encounter for immunization: Secondary | ICD-10-CM

## 2019-10-28 DIAGNOSIS — G893 Neoplasm related pain (acute) (chronic): Secondary | ICD-10-CM

## 2019-10-28 DIAGNOSIS — Z299 Encounter for prophylactic measures, unspecified: Secondary | ICD-10-CM

## 2019-10-28 DIAGNOSIS — R600 Localized edema: Secondary | ICD-10-CM | POA: Diagnosis not present

## 2019-10-28 DIAGNOSIS — Z5111 Encounter for antineoplastic chemotherapy: Secondary | ICD-10-CM | POA: Diagnosis not present

## 2019-10-28 DIAGNOSIS — T451X5A Adverse effect of antineoplastic and immunosuppressive drugs, initial encounter: Secondary | ICD-10-CM

## 2019-10-28 DIAGNOSIS — R5381 Other malaise: Secondary | ICD-10-CM

## 2019-10-28 DIAGNOSIS — Z7189 Other specified counseling: Secondary | ICD-10-CM

## 2019-10-28 LAB — CMP (CANCER CENTER ONLY)
ALT: 8 U/L (ref 0–44)
AST: 16 U/L (ref 15–41)
Albumin: 3.2 g/dL — ABNORMAL LOW (ref 3.5–5.0)
Alkaline Phosphatase: 72 U/L (ref 38–126)
Anion gap: 6 (ref 5–15)
BUN: 9 mg/dL (ref 6–20)
CO2: 28 mmol/L (ref 22–32)
Calcium: 9 mg/dL (ref 8.9–10.3)
Chloride: 102 mmol/L (ref 98–111)
Creatinine: 0.91 mg/dL (ref 0.44–1.00)
GFR, Est AFR Am: >60 mL/min (ref >60)
GFR, Estimated: >60 mL/min (ref >60)
Glucose, Bld: 89 mg/dL (ref 70–99)
Potassium: 4.2 mmol/L (ref 3.5–5.1)
Sodium: 136 mmol/L (ref 135–145)
Total Bilirubin: 0.4 mg/dL (ref 0.3–1.2)
Total Protein: 6 g/dL — ABNORMAL LOW (ref 6.5–8.1)

## 2019-10-28 LAB — TSH: TSH: 0.896 u[IU]/mL (ref 0.308–3.960)

## 2019-10-28 LAB — CBC WITH DIFFERENTIAL (CANCER CENTER ONLY)
Abs Immature Granulocytes: 0.01 10*3/uL (ref 0.00–0.07)
Basophils Absolute: 0 10*3/uL (ref 0.0–0.1)
Basophils Relative: 0 %
Eosinophils Absolute: 0 10*3/uL (ref 0.0–0.5)
Eosinophils Relative: 1 %
HCT: 22.4 % — ABNORMAL LOW (ref 36.0–46.0)
Hemoglobin: 7.5 g/dL — ABNORMAL LOW (ref 12.0–15.0)
Immature Granulocytes: 0 %
Lymphocytes Relative: 25 %
Lymphs Abs: 0.6 10*3/uL — ABNORMAL LOW (ref 0.7–4.0)
MCH: 33.2 pg (ref 26.0–34.0)
MCHC: 33.5 g/dL (ref 30.0–36.0)
MCV: 99.1 fL (ref 80.0–100.0)
Monocytes Absolute: 0.2 10*3/uL (ref 0.1–1.0)
Monocytes Relative: 10 %
Neutro Abs: 1.5 10*3/uL — ABNORMAL LOW (ref 1.7–7.7)
Neutrophils Relative %: 64 %
Platelet Count: 61 10*3/uL — ABNORMAL LOW (ref 150–400)
RBC: 2.26 MIL/uL — ABNORMAL LOW (ref 3.87–5.11)
RDW: 19.6 % — ABNORMAL HIGH (ref 11.5–15.5)
WBC Count: 2.4 10*3/uL — ABNORMAL LOW (ref 4.0–10.5)
nRBC: 0 % (ref 0.0–0.2)

## 2019-10-28 LAB — PREPARE RBC (CROSSMATCH)

## 2019-10-28 LAB — SAMPLE TO BLOOD BANK

## 2019-10-28 MED ORDER — INFLUENZA VAC SPLIT QUAD 0.5 ML IM SUSY
PREFILLED_SYRINGE | INTRAMUSCULAR | Status: AC
  Filled 2019-10-28: qty 0.5

## 2019-10-28 MED ORDER — SODIUM CHLORIDE 0.9 % IV SOLN
Freq: Once | INTRAVENOUS | Status: AC
  Filled 2019-10-28: qty 250

## 2019-10-28 MED ORDER — DIPHENHYDRAMINE HCL 25 MG PO CAPS
ORAL_CAPSULE | ORAL | Status: AC
  Filled 2019-10-28: qty 2

## 2019-10-28 MED ORDER — ACETAMINOPHEN 325 MG PO TABS
ORAL_TABLET | ORAL | Status: AC
  Filled 2019-10-28: qty 2

## 2019-10-28 MED ORDER — INFLUENZA VAC SPLIT QUAD 0.5 ML IM SUSY
0.5000 mL | PREFILLED_SYRINGE | Freq: Once | INTRAMUSCULAR | Status: AC
  Administered 2019-10-28: 0.5 mL via INTRAMUSCULAR

## 2019-10-28 MED ORDER — DIPHENHYDRAMINE HCL 25 MG PO CAPS
25.0000 mg | ORAL_CAPSULE | Freq: Once | ORAL | Status: AC
  Administered 2019-10-28: 25 mg via ORAL

## 2019-10-28 MED ORDER — OXYCODONE-ACETAMINOPHEN 5-325 MG PO TABS: 2.0000 | ORAL_TABLET | Freq: Four times a day (QID) | ORAL | 0 refills | Status: DC | PRN

## 2019-10-28 MED ORDER — SODIUM CHLORIDE 0.9 % IV SOLN
200.0000 mg | Freq: Once | INTRAVENOUS | Status: AC
  Administered 2019-10-28: 200 mg via INTRAVENOUS
  Filled 2019-10-28: qty 8

## 2019-10-28 MED ORDER — ACETAMINOPHEN 325 MG PO TABS
650.0000 mg | ORAL_TABLET | Freq: Once | ORAL | Status: AC
  Administered 2019-10-28: 650 mg via ORAL

## 2019-10-28 MED FILL — OXYCODONE-APAP 5-325MG: 5-325 | 11 days supply | Qty: 90 | Fill #0

## 2019-10-28 NOTE — Patient Instructions (Signed)
[facility] Discharge Instructions for Patients Receiving Chemotherapy  Today you received the following chemotherapy agents: pembrolizumab.   To help prevent nausea and vomiting after your treatment, we encourage you to take your nausea medication as directed.   If you develop nausea and vomiting that is not controlled by your nausea medication, call the clinic.   BELOW ARE SYMPTOMS THAT SHOULD BE REPORTED IMMEDIATELY:  *FEVER GREATER THAN 100.5 F  *CHILLS WITH OR WITHOUT FEVER  NAUSEA AND VOMITING THAT IS NOT CONTROLLED WITH YOUR NAUSEA MEDICATION  *UNUSUAL SHORTNESS OF BREATH  *UNUSUAL BRUISING OR BLEEDING  TENDERNESS IN MOUTH AND THROAT WITH OR WITHOUT PRESENCE OF ULCERS  *URINARY PROBLEMS  *BOWEL PROBLEMS  UNUSUAL RASH Items with * indicate a potential emergency and should be followed up as soon as possible.  Feel free to call the clinic should you have any questions or concerns. The clinic phone number is [phone].  Please show the [facility] at check-in to the Emergency Department and triage nurse.  Blood Transfusion, Adult A blood transfusion is a procedure in which you receive blood or a type of blood cell (blood component) through an IV. You may need a blood transfusion when your blood level is low. This may result from a bleeding disorder, illness, injury, or surgery. The blood may come from a donor. You may also be able to donate blood for yourself (autologous blood donation) before a planned surgery. The blood given in a transfusion is made up of different blood components. You may receive:  Red blood cells. These carry oxygen to the cells in the body.  Platelets. These help your blood to clot.  Plasma. This is the liquid part of your blood. It carries proteins and other substances throughout the body.  White blood cells. These help you fight infections. If you have hemophilia or another clotting disorder, you may also receive other  types of blood products. Tell a health care provider about:  Any blood disorders you have.  Any previous reactions you have had during a blood transfusion.  Any allergies you have.  All medicines you are taking, including vitamins, herbs, eye drops, creams, and over-the-counter medicines.  Any surgeries you have had.  Any medical conditions you have, including any recent fever or cold symptoms.  Whether you are pregnant or may be pregnant. What are the risks? Generally, this is a safe procedure. However, problems may occur.  The most common problems include: ? A mild allergic reaction, such as red, swollen areas of skin (hives) and itching. ? Fever or chills. This may be the body's response to new blood cells received. This may occur during or up to 4 hours after the transfusion.  More serious problems may include: ? Transfusion-associated circulatory overload (TACO), or too much fluid in the lungs. This may cause breathing problems. ? A serious allergic reaction, such as difficulty breathing or swelling around the face and lips. ? Transfusion-related acute lung injury (TRALI), which causes breathing difficulty and low oxygen in the blood. This can occur within hours of the transfusion or several days later. ? Iron overload. This can happen after receiving many blood transfusions over a period of time. ? Infection or virus being transmitted. This is rare because donated blood is carefully tested before it is given. ? Hemolytic transfusion reaction. This is rare. It happens when your body's defense system (immune system)tries to attack the new blood cells. Symptoms may include fever, chills, nausea, low blood pressure, and  low back or chest pain. ? Transfusion-associated graft-versus-host disease (TAGVHD). This is rare. It happens when donated cells attack your body's healthy tissues. What happens before the procedure? Medicines Ask your health care provider about:  Changing or  stopping your regular medicines. This is especially important if you are taking diabetes medicines or blood thinners.  Taking medicines such as aspirin and ibuprofen. These medicines can thin your blood. Do not take these medicines unless your health care provider tells you to take them.  Taking over-the-counter medicines, vitamins, herbs, and supplements. General instructions  Follow instructions from your health care provider about eating and drinking restrictions.  You will have a blood test to determine your blood type. This is necessary to know what kind of blood your body will accept and to match it to the donor blood.  If you are going to have a planned surgery, you may be able to do an autologous blood donation. This may be done in case you need to have a transfusion.  You will have your temperature, blood pressure, and pulse monitored before the transfusion.  If you have had an allergic reaction to a transfusion in the past, you may be given medicine to help prevent a reaction. This medicine may be given to you by mouth (orally) or through an IV.  Set aside time for the blood transfusion. This procedure generally takes 1-4 hours to complete. What happens during the procedure?   An IV will be inserted into one of your veins.  The bag of donated blood will be attached to your IV. The blood will then enter through your vein.  Your temperature, blood pressure, and pulse will be monitored regularly during the transfusion. This monitoring is done to detect early signs of a transfusion reaction.  Tell your nurse right away if you have any of these symptoms during the transfusion: ? Shortness of breath or trouble breathing. ? Chest or back pain. ? Fever or chills. ? Hives or itching.  If you have any signs or symptoms of a reaction, your transfusion will be stopped and you may be given medicine.  When the transfusion is complete, your IV will be removed.  Pressure may be applied  to the IV site for a few minutes.  A bandage (dressing)will be applied. The procedure may vary among health care providers and hospitals. What happens after the procedure?  Your temperature, blood pressure, pulse, breathing rate, and blood oxygen level will be monitored until you leave the hospital or clinic.  Your blood may be tested to see how you are responding to the transfusion.  You may be warmed with fluids or blankets to maintain a normal body temperature.  If you receive your blood transfusion in an outpatient setting, you will be told whom to contact to report any reactions. Where to find more information For more information on blood transfusions, visit the American Red Cross: redcross.org Summary  A blood transfusion is a procedure in which you receive blood or a type of blood cell (blood component) through an IV.  The blood you receive may come from a donor or be donated by yourself (autologous blood donation) before a planned surgery.  The blood given in a transfusion is made up of different blood components. You may receive red blood cells, platelets, plasma, or white blood cells depending on the condition treated.  Your temperature, blood pressure, and pulse will be monitored before, during, and after the transfusion.  After the transfusion, your blood may be  tested to see how your body has responded. This information is not intended to replace advice given to you by your health care provider. Make sure you discuss any questions you have with your health care provider. Document Revised: 07/23/2018 Document Reviewed: 07/23/2018 Elsevier Patient Education  [place].

## 2019-10-28 NOTE — Assessment & Plan Note (Signed)
Per previous discussion, we are switching her treatment to pembrolizumab She is aware of side effects and would like to proceed We will see her back in 3 weeks for further follow-up

## 2019-10-28 NOTE — Assessment & Plan Note (Signed)
Her pain is well controlled She will continue prescribed pain medicine

## 2019-10-28 NOTE — Progress Notes (Signed)
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 The patient is symptomatic from anemia and the hemoglobin level is critically low.  Some of the side-effects to be expected including risks of transfusion reactions, chills, infection, syndrome of volume overload and risk of hospitalization from various reasons and the patient is willing to proceed and went ahead to sign consent today.   Cancer associated pain Her pain is well controlled She will continue prescribed pain medicine  Edema of right lower extremity She is doing well with physical therapy We will continue for lymphedema treatment  Physical debility We discussed the importance of preventive care and reviewed the vaccination programs. She does not have any prior allergic reactions to influenza vaccination. She agrees to proceed with influenza vaccination today and we will administer it today at the clinic.    Orders Placed This Encounter  Procedures  . Informed Consent Details: Physician/Practitioner Attestation; Transcribe to consent form and obtain patient signature    Standing Status:   Future    Number of Occurrences:   1    Standing Expiration Date:   10/27/2020    Order Specific Question:   Physician/Practitioner attestation of informed consent for blood and or blood product transfusion    Answer:   I, the  physician/practitioner, attest that I have discussed with the patient the benefits, risks, side effects, alternatives, likelihood of achieving goals and potential problems during recovery for the procedure that I have provided informed consent.    Order Specific Question:   Product(s)    Answer:   All Product(s)  . Care order/instruction    Transfuse Parameters    Standing Status:   Future    Number of Occurrences:   1    Standing Expiration Date:   10/27/2020  . Type and screen    Standing Status:   Future    Number of Occurrences:   1    Standing Expiration Date:   10/27/2020  . Prepare RBC (crossmatch)    Standing Status:   Standing    Number of Occurrences:   1    Order Specific Question:   # of Units    Answer:   1 unit    Order Specific Question:   Transfusion Indications    Answer:   Symptomatic Anemia    Order Specific Question:   Special Requirements    Answer:   Irradiated    Order Specific Question:   Number of Units to Keep Ahead    Answer:   NO units ahead    Order Specific Question:   Instructions:    Answer:   Transfuse    Order Specific Question:   If emergent release call blood bank    Answer:   Not emergent release    All questions were answered. The patient knows to call the clinic with any problems, questions or concerns. The total time spent in the appointment was 83  minutes encounter with patients including review of chart and various tests results, discussions about plan of care and coordination of care plan   Heath Lark, MD 10/28/2019 12:55 PM  INTERVAL HISTORY: Please see below for problem oriented charting. She returns with her son for further follow-up She is doing well Her pain is well controlled She is eating healthier The patient denies any recent signs or symptoms of bleeding such as spontaneous epistaxis, hematuria or hematochezia. Denies recent nausea or constipation  SUMMARY OF ONCOLOGIC HISTORY: Oncology History Overview Note  PD-L1 5%    Malignant neoplasm of endocervix ([place])  09/15/2017 Initial Diagnosis   The patient reports postcoital bleeding since August, 2019 and postmenopausal bleeding since November, 2019. 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    04/16/2018 Surgery   She was taken to the OR on 04/16/18 for a cervical cone biopsy. The specimen revealed a grossly normal cervix on the ectocervix.  Cone specimen revealed squamous cell carcinoma.  When discussing this with the pathologist he reported that in the actual cone specimen itself that was CIN 3 however there was a separate fragment of tissue that included carcinoma.  The dimensions of this was 0.6 cm, however this involved the margins.  A post cone ECC was positive for squamous cell carcinoma, as well as an endometrial curettage which also contain benign endometrial glands.    04/16/2018 Pathology Results   Endocervix curettage: [facility]   04/22/2018 Initial Diagnosis   Malignant neoplasm of endocervix ([place])   04/24/2018 PET scan   1. There is a large mass involving the cervix and endometrium which has a maximum dimension of 5.3 cm within SUV max of 20.4. 2. Small bilateral pelvic sidewall lymph nodes measuring less than 1cm with mild nonspecific uptake. The no hypermetabolic adenopathy or evidence of distant metastatic disease. 3. Nonspecific pulmonary nodules measuring less than 5 mm are identified in the right lung. Too small to characterize by PET-CT.    04/27/2018 Imaging   MRI pelvis  1. Uterine cervix 5.5 x 3.4 x 3.2 cm mass compatible with primary cervical malignancy, with mild parametrial invasion. Stage IIB by MRI. 2. Small volume simple free fluid in the pelvic cul-de-sac. 3. No pathologically enlarged pelvic lymph nodes, see comments.   05/01/2018 Cancer Staging   Staging form: Cervix Uteri, AJCC 8th Edition - Clinical: FIGO Stage IIB (cT2b, cN0, cM0) - Signed by Heath Lark, MD on 05/01/2018   05/08/2018 Procedure   Placement of a subcutaneous port device. Catheter tip at the superior cavoatrial junction.   05/14/2018 - 07/27/2018 Radiation Therapy   Radiation treatment dates:    1. 05/14/2018 - 06/24/2018 2. 5/21, 5/28, 6/1, 6/9, 07/27/2018   Site/dose:    1.         A. pelvsi / 45 Gy in 25 fractions              B. Parametrial  boost / 9 Gy in 5 fractions 2. Cervix, Tandem and Ring System / 27.5 Gy in 5 fractions     05/15/2018 - 06/26/2018 Chemotherapy   The patient had weekly cisplatin   10/27/2018 PET scan   Near complete resolution of FDG uptake at site of previously seen cervical mass. Resolution of previously seen sub-cm pelvic lymph nodes. No evidence of metabolically-active metastatic disease.   05/13/2019 Imaging   1. There is ill-defined, hypoenhancing soft tissue most clearly  visualized at the left aspect of the uterus and adnexa, which obstructs the distal left ureter, measuring approximately 4.0 x 3.4 cm. This is concerning for local recurrence of cervical malignancy.   2. Ill-defined, hypoenhancing appearance of the cervix with loss of distinction of the fat plane to the posterior urinary bladder, in keeping with primary malignancy.   3. Interval enlargement of numerous abnormal retroperitoneal and bilateral inguinal lymph nodes, consistent with nodal metastatic disease.    4. There is new moderate left, mild right hydronephrosis and hydroureter, with soft tissue described above obstructing the distal left ureter in the vicinity of the adnexa. There is high-grade obstruction of the distal left ureter with no excreted contrast on delayed phase imaging. Point of obstruction of the right ureter is not clearly visualized, possibly at the ureterovesicular junction.      Genetic Testing   Patient has genetic testing done for PD-L1. Results revealed patient has the following: PD-L1 staining in tumor cells (TC): 5% PD-L1 staining in tumor associated  immune cells (IC): <1% PD-L1 combined positive score (CPS): 5%   05/28/2019 -  Chemotherapy   The patient had carboplatin and taxol for chemotherapy treatment.     07/29/2019 Imaging   1. Mild improvement in mild left paraaortic retroperitoneal lymphadenopathy. No new or progressive metastatic disease identified. 2. No significant change in size of soft tissue mass in the left adnexa and parametrium. 3. Stable diffuse bladder wall thickening, consistent with cystitis and likely due to previous radiation therapy. 4. Interval placement of bilateral ureteral stents, with resolution of bilateral hydroureteronephrosis since prior study. 5. Stable diffuse left renal parenchymal atrophy. 6. Large stool burden noted; recommend clinical correlation for possible constipation.   09/01/2019 Surgery   Operative Note   Preoperative diagnosis:  1.  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  Same   Procedure(s): 1.  Cystoscopy with bilateral stent exchange 2.  Bilateral retrograde pyelograms with intraoperative interpretation of fluoroscopic imaging   Surgeon: Ellison Hughs, MD    Specimens: 1.  Previously placed bilateral JJ stents were removed intact, inspected and discarded   Drains/Catheters: 1.  Bilateral 6 French, 24 cm Polaris stents without tethers   Intraoperative findings:   1. Right retrograde pyelogram revealed dilation starting in the mid to proximal aspects of the right ureter, extending up to the right renal pelvis.  No distal filling defects were identified. 2. Left retrograde pyelogram revealed dilation starting in the mid to proximal aspects of the left ureter, extending up to the left renal pelvis.  No distal filling defects were identified   10/14/2019 Imaging   1. Small RIGHT effusion increased from the previous exam with some suggestion of early diaphragmatic nodularity and with soft tissue nodules or small lymph  nodes in the cardiophrenic recess, this along with thoracic inlet adenopathy compatible with disease in the chest. 2. Increase in abdominal ascites and subjective increase in fascial thickening in the lower abdomen and pelvis may reflect worsening of disease in this area. Attention on follow-up. 3. Accurate measurements are difficult with respect adnexal structures, increased fullness of the RIGHT adnexa since previous imaging. 4. Retroperitoneal lymph nodes with similar appearance. Stranding about retroperitoneal vessels with some increase. 5. Signs of presumed cystitis with bilateral nephroureteral stents in place not significantly changed from the previous study. 6. Post radiation changes about the rectum. Slightly indistinct appearance of the distal colon along the descending and sigmoid favored to represent increasing serosal involvement in  the setting of peritoneal disease. Correlate with any clinical evidence of colitis. 7. Aortic atherosclerosis.   10/28/2019 -  Chemotherapy   The patient had pembrolizumab (KEYTRUDA) 200 mg in sodium chloride 0.9 % 50 mL chemo infusion, 200 mg, Intravenous, Once, 1 of 6 cycles  for chemotherapy treatment.      REVIEW OF SYSTEMS:   Constitutional: Denies fevers, chills or abnormal weight loss Eyes: Denies blurriness of vision Ears, nose, mouth, throat, and face: Denies mucositis or sore throat Respiratory: Denies cough, dyspnea or wheezes Cardiovascular: Denies palpitation, chest discomfort  Gastrointestinal:  Denies nausea, heartburn or change in bowel habits Skin: Denies abnormal skin rashes Lymphatics: Denies new lymphadenopathy or easy bruising Neurological:Denies numbness, tingling or new weaknesses Behavioral/Psych: Mood is stable, no new changes  All other systems were reviewed with the patient and are negative.  I have reviewed the past medical history, past surgical history, social history and family history with the patient and they are  unchanged from previous note.  ALLERGIES:  has No Known Allergies.  MEDICATIONS:  Current Outpatient Medications  Medication Sig Dispense Refill  . acetaminophen (TYLENOL) 325 MG tablet Take 650 mg by mouth every 6 (six) hours as needed. 08-23-2019 Pt takes one tablet twice daily with one percocet    . methadone (DOLOPHINE) 10 MG tablet Take 1 tablet (10 mg total) by mouth every 12 (twelve) hours. 30 tablet 0  . ondansetron (ZOFRAN) 8 MG tablet Take 1 tablet (8 mg total) by mouth 2 (two) times daily as needed for refractory nausea / vomiting. Start on day 3 after carboplatin chemo. 30 tablet 1  . oxybutynin (DITROPAN) 5 MG tablet Take 1 tablet (5 mg total) by mouth every 8 (eight) hours as needed for bladder spasms. (Patient taking differently: Take 5 mg by mouth every 8 (eight) hours as needed for bladder spasms. 08-23-2019 Per pt is taking on regular basis one tablet twice daily) 30 tablet 1  . oxyCODONE-acetaminophen (PERCOCET/ROXICET) 5-325 MG tablet Take 2 tablets by mouth every 6 (six) hours as needed for severe pain. 90 tablet 0  . prochlorperazine (COMPAZINE) 10 MG tablet Take 1 tablet (10 mg total) by mouth every 6 (six) hours as needed (Nausea or vomiting). 30 tablet 1  . tamsulosin (FLOMAX) 0.4 MG CAPS capsule Take 0.4 mg by mouth 2 (two) times daily.     No current facility-administered medications for this visit.   Facility-Administered Medications Ordered in Other Visits  Medication Dose Route Frequency Provider Last Rate Last Admin  . influenza vac split quadrivalent PF (FLUARIX) injection 0.5 mL  0.5 mL Intramuscular Once Alvy Bimler, Lezley Bedgood, MD        PHYSICAL EXAMINATION: ECOG PERFORMANCE STATUS: 1 - Symptomatic but completely ambulatory  Vitals:   10/28/19 1001  BP: 117/77  Pulse: 87  Resp: 17  Temp: 98.1 F (36.7 C)  SpO2: 100%   Filed Weights   10/28/19 1001  Weight: 149 lb 3.2 oz (67.7 kg)    GENERAL:alert, no distress and comfortable SKIN: skin color, texture,  turgor are normal, no rashes or significant lesions EYES: normal, Conjunctiva are pink and non-injected, sclera clear OROPHARYNX:no exudate, no erythema and lips, buccal mucosa, and tongue normal  NECK: supple, thyroid normal size, non-tender, without nodularity LYMPH:  no palpable lymphadenopathy in the cervical, axillary or inguinal LUNGS: clear to auscultation and percussion with normal breathing effort HEART: regular rate & rhythm and no murmurs with mild right lower extremity edema ABDOMEN:abdomen soft, non-tender and normal bowel sounds Musculoskeletal:no  cyanosis of digits and no clubbing  NEURO: alert & oriented x 3 with fluent speech, no focal motor/sensory deficits  LABORATORY DATA:  I have reviewed the data as listed    Component Value Date/Time   NA 136 10/28/2019 0938   K 4.2 10/28/2019 0938   CL 102 10/28/2019 0938   CO2 28 10/28/2019 0938   GLUCOSE 89 10/28/2019 0938   BUN 9 10/28/2019 0938   CREATININE 0.91 10/28/2019 0938   CALCIUM 9.0 10/28/2019 0938   PROT 6.0 (L) 10/28/2019 0938   ALBUMIN 3.2 (L) 10/28/2019 0938   AST 16 10/28/2019 0938   ALT 8 10/28/2019 0938   ALKPHOS 72 10/28/2019 0938   BILITOT 0.4 10/28/2019 0938   GFRNONAA >60 10/28/2019 0938   GFRAA >60 10/28/2019 0938    No results found for: SPEP, UPEP  Lab Results  Component Value Date   WBC 2.4 (L) 10/28/2019   NEUTROABS 1.5 (L) 10/28/2019   HGB 7.5 (L) 10/28/2019   HCT 22.4 (L) 10/28/2019   MCV 99.1 10/28/2019   PLT 61 (L) 10/28/2019      Chemistry      Component Value Date/Time   NA 136 10/28/2019 0938   K 4.2 10/28/2019 0938   CL 102 10/28/2019 0938   CO2 28 10/28/2019 0938   BUN 9 10/28/2019 0938   CREATININE 0.91 10/28/2019 0938      Component Value Date/Time   CALCIUM 9.0 10/28/2019 0938   ALKPHOS 72 10/28/2019 0938   AST 16 10/28/2019 0938   ALT 8 10/28/2019 0938   BILITOT 0.4 10/28/2019 4163

## 2019-10-28 NOTE — Assessment & Plan Note (Signed)
We discussed the importance of preventive care and reviewed the vaccination programs. She does not have any prior allergic reactions to influenza vaccination. She agrees to proceed with influenza vaccination today and we will administer it today at the clinic.  

## 2019-10-28 NOTE — Assessment & Plan Note (Addendum)
She has persistent pancytopenia due to treatment I recommend we proceed with blood transfusion along with her treatment today We discussed some of the risks, benefits, and alternatives of blood transfusions. The patient is symptomatic from anemia and the hemoglobin level is critically low.  Some of the side-effects to be expected including risks of transfusion reactions, chills, infection, syndrome of volume overload and risk of hospitalization from various reasons and the patient is willing to proceed and went ahead to sign consent today.

## 2019-10-28 NOTE — Assessment & Plan Note (Signed)
She is doing well with physical therapy We will continue for lymphedema treatment

## 2019-10-29 LAB — BPAM RBC
Blood Product Expiration Date: 202110092359
ISSUE DATE / TIME: 202109161222
Unit Type and Rh: 7300

## 2019-10-29 LAB — T4: T4, Total: 9.1 ug/dL (ref 4.5–12.0)

## 2019-10-29 LAB — TYPE AND SCREEN
ABO/RH(D): B POS
Antibody Screen: NEGATIVE
Unit division: 0

## 2019-11-03 ENCOUNTER — Other Ambulatory Visit: Payer: Self-pay

## 2019-11-03 ENCOUNTER — Ambulatory Visit: Payer: Medicaid Other

## 2019-11-03 DIAGNOSIS — R262 Difficulty in walking, not elsewhere classified: Secondary | ICD-10-CM

## 2019-11-03 DIAGNOSIS — C530 Malignant neoplasm of endocervix: Secondary | ICD-10-CM

## 2019-11-03 DIAGNOSIS — I890 Lymphedema, not elsewhere classified: Secondary | ICD-10-CM

## 2019-11-03 NOTE — Therapy (Addendum)
[place], [place], [address] Phone: [phone]   Fax:  [phone]  Physical Therapy Treatment  Patient Details  Name: Sheena Torres MRN: [id number] Date of Birth: [birth year] Referring Provider (PT): Alvy Bimler   Encounter Date: 11/03/2019   PT End of Session - 11/03/19 1004    Visit Number 2    Number of Visits 13    Date for PT Re-Evaluation 11/30/19    Authorization Type Medicaid UHC    Authorization - Visit Number 2    Authorization - Number of Visits 27    PT Start Time 0907    PT Stop Time 1004    PT Time Calculation (min) 57 min    Activity Tolerance Patient tolerated treatment well    Behavior During Therapy [facility] for tasks assessed/performed           Past Medical History:  Diagnosis Date  . ADHD   . Anemia   . Bilateral edema of lower extremity    08-23-2019  per pt wears compression hose  . Bilateral ureteral obstruction    urologist--- dr winter  . Cervical cancer [facility]) oncologist-- dr gorsuch/  dr Sondra Come   Stage IIB, SCC--- chemo 05-14-2018 to 06-26-2018 and started chemo 05-28-2019 weekly;  completed IMRT w/ last 5 at high dose 05-14-2018  to 07-27-2018  . Chemotherapy-induced nausea    08-23-2019  per pt intermittant  . Chronic cystitis   . Chronic pain    due to chemo  . Fatigue   . Hematuria    08-23-2019  per pt intermittant  . IBS (irritable bowel syndrome)    w/ diarrhea  . Migraine   . Nocturia more than twice per night   . Pancytopenia, acquired ([place])   . Port-A-Cath in place 05/08/2018  . Sensation of pressure in bladder area    occasional due to radiation therapy  . Urgency of urination    08-23-2019 mostly during the day    Past Surgical History:  Procedure Laterality Date  . BREAST ENHANCEMENT SURGERY Bilateral 2017   w/ implants  . CESAREAN SECTION  2000  . CYSTOSCOPY WITH STENT PLACEMENT Bilateral 05/26/2019   Procedure: CYSTOSCOPY WITH BILATERAL   STENT PLACEMENT;  Surgeon: Ceasar Mons, MD;  Location: [facility];  Service: Urology;  Laterality: Bilateral;  . CYSTOSCOPY WITH STENT PLACEMENT Bilateral 09/01/2019   Procedure: CYSTOSCOPY WITH STENT EXCHANGE/ RETROGRADE;  Surgeon: Ceasar Mons, MD;  Location: [facility];  Service: Urology;  Laterality: Bilateral;  ONLY NEEDS 30 MIN  . IR IMAGING GUIDED PORT INSERTION  05/08/2018  . IR REMOVAL TUN ACCESS W/ PORT W/O FL MOD SED  11/16/2018  . OPERATIVE ULTRASOUND N/A 07/02/2018   Procedure: OPERATIVE ULTRASOUND;  Surgeon: Gery Pray, MD;  Location: [facility];  Service: Urology;  Laterality: N/A;  . OPERATIVE ULTRASOUND N/A 07/09/2018   Procedure: OPERATIVE ULTRASOUND;  Surgeon: Gery Pray, MD;  Location: [facility];  Service: Urology;  Laterality: N/A;  . OPERATIVE ULTRASOUND N/A 07/13/2018   Procedure: OPERATIVE ULTRASOUND;  Surgeon: Gery Pray, MD;  Location: [facility];  Service: Urology;  Laterality: N/A;  . OPERATIVE ULTRASOUND N/A 07/21/2018   Procedure: OPERATIVE ULTRASOUND;  Surgeon: Gery Pray, MD;  Location: [facility];  Service: Urology;  Laterality: N/A;  . OPERATIVE ULTRASOUND N/A 07/27/2018   Procedure: OPERATIVE ULTRASOUND;  Surgeon: Gery Pray, MD;  Location: [facility];  Service: Urology;  Laterality: N/A;  . TANDEM RING INSERTION N/A 07/02/2018   Procedure: TANDEM RING INSERTION;  Surgeon: Gery Pray, MD;  Location: [facility];  Service: Urology;  Laterality: N/A;  . TANDEM RING INSERTION N/A 07/09/2018   Procedure: TANDEM RING INSERTION;  Surgeon: Gery Pray, MD;  Location: [facility];  Service: Urology;  Laterality: N/A;  . TANDEM RING INSERTION N/A 07/13/2018   Procedure: TANDEM RING INSERTION;  Surgeon: Gery Pray, MD;  Location[facility];  Service: Urology;  Laterality: N/A;  . TANDEM RING  INSERTION N/A 07/21/2018   Procedure: TANDEM RING INSERTION;  Surgeon: Gery Pray, MD;  Location: [facility];  Service: Urology;  Laterality: N/A;  . TANDEM RING INSERTION N/A 07/27/2018   Procedure: TANDEM RING INSERTION;  Surgeon: Gery Pray, MD;  Location: [facility];  Service: Urology;  Laterality: N/A;    There were no vitals filed for this visit.   Subjective Assessment - 11/03/19 0911    Subjective I bourht my son to help me learn what I need to do.    Pertinent History cervical cancer, stage IIB, radiation 05/14/18-06/24/18, 04/16/18 cervical cone biopsy, Sept 2020 was cancer free, Feb 2021- recurrence - tumour on L side of uterus, another tumor between kidney and bladder, pt recently stopped chemo and switched to immunotherapy,    Patient Stated Goals to get the swelling in the right leg down    Currently in Pain? No/denies                       [facility]  Lymphedema Life Impact Scale Total Score 39.71 %            [facility] Adult PT Treatment/Exercise - 11/03/19 0001      Manual Therapy   Manual Therapy Manual Lymphatic Drainage (MLD);Compression Bandaging    Manual Lymphatic Drainage (MLD) In Supine: Short neck, 5 diaphragmatic breaths, Rt axillary and Lt inguinal nodes, Rt inguino-axillary and anterior inter-inguinal anastomosis, then focused on lower Rt abdomen directing towards inguinao-axillary anastomosis, (did not cont with ant inter-inguinal due to abdominal swelling) Rt LE working from proximal to distal then retracing all steps and beginning to instruct pt and son while performing.    Compression Bandaging Coconut oil and thin stokcinette then bandaging as follows to Rt LE below knee: Elastomull to toes 1-3, Artiflex x1, 1-6 and 1-12 cm short stretch compression bandages from foot to knee; then was able to get her sandal back on.                    PT Education - 11/03/19 1017    Education Details Began instruction of self MLD    Person(s) Educated Patient;Child(ren)    Methods Explanation;Demonstration;Handout    Comprehension Verbalized understanding;Returned demonstration;Need further instruction               PT Long Term Goals - 10/26/19 1215      PT LONG TERM GOAL #1   Title Pt will demonstrate a 3 cm reduction in edema at 20 cm proximal to floor at lateral plantar foot to decrease risk of cellulitis.    Baseline R 32 cm    Time 5    Period Weeks    Status New    Target Date 11/30/19      PT LONG TERM GOAL #2   Title Pt will demonstrate a 5 cm decrease  in edema at 20 cm proximal to suprapatella to decrease risk of infection.    Baseline R 59 cm    Time 5    Period Weeks    Status New    Target Date 11/30/19      PT LONG TERM GOAL #3   Title Pt will be independent in management of lymphedema through self MLD and compression garments.    Time 5    Period Weeks    Status New    Target Date 11/30/19      PT LONG TERM GOAL #4   Title Pt would benefit from appropriate day and nightitme compression garments for long term management of lymphedema.    Time 5    Period Weeks    Status New    Target Date 11/30/19      PT LONG TERM GOAL #5   Title Pt would benefit from a trial of FlexiTouch compression pump for long term management of lymphedema.    Time 5    Period Weeks    Status New    Target Date 11/30/19                 Plan - 11/03/19 1006    Clinical Impression Statement Began first session of complete decongestive therapy instructing pt and son throughout. Started with less bandages and to knee only to see how pts urinary output does. If her output doesn't decrease, okay to add more bandages working to thigh high. She will benefit from further review of MLD and bandaging instructing in both. Also suggested pt try wearing biker shorts with high waist fos compression to abdomen as well.    Personal  Factors and Comorbidities Comorbidity 1    Comorbidities bilateral kidney stents, pancytopenia    Examination-Activity Limitations Locomotion Level    Examination-Participation Restrictions Community Activity;Cleaning;Occupation    Stability/Clinical Decision Making Stable/Uncomplicated    Rehab Potential Good    PT Frequency 3x / week    PT Duration --   5 weeks   PT Treatment/Interventions ADLs/Self Care Home Management;Therapeutic exercise;Therapeutic activities;Patient/family education;Manual techniques;Manual lymph drainage;Compression bandaging;Taping;Passive range of motion;Vasopneumatic Device    PT Next Visit Plan Cont and instruct MLD and compression bandaging (not too tight due to kidney issues); if tolerated okay (no der in urinary output) add bandages working to thigh; get bike shorts? try large piece of rectangle shaped gray compression foam here?    PT Home Exercise Plan Self MLD;    Consulted and Agree with Plan of Care Patient           Patient will benefit from skilled therapeutic intervention in order to improve the following deficits and impairments:  Increased edema, Pain, Difficulty walking  Visit Diagnosis: Lymphedema, not elsewhere classified  Difficulty in walking, not elsewhere classified  Malignant neoplasm of endocervix [employer])     Problem List Patient Active Problem List   Diagnosis Date Noted  . Preventive measure 10/28/2019  . UTI (urinary tract infection) 07/30/2019  . Edema of right lower extremity 06/18/2019  . Anemia due to antineoplastic chemotherapy 06/18/2019  . Mucositis due to antineoplastic therapy 06/18/2019  . Financial difficulties 05/21/2019  . Bilateral hydronephrosis 05/18/2019  . Peripheral neuropathy due to chemotherapy ([place]) 09/25/2018  . Pancytopenia, acquired ([place]) 06/05/2018  . Dysuria 06/04/2018  . Cancer associated pain 05/01/2018  . Other constipation 05/01/2018  . Physical debility 05/01/2018  . Goals of care,  counseling/discussion 05/01/2018  . Malignant neoplasm of endocervix ([place]) 04/22/2018  .  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 ADHD (attention deficit hyperactivity disorder), inattentive type 10/11/2015    Otelia Limes, PTA 11/03/2019, 10:28 AM  [place], [place], [address] Phone: [phone]   Fax:  [phone]  Name: Sheena Torres MRN: [id number] Date of Birth: September 16, 1965

## 2019-11-03 NOTE — Patient Instructions (Addendum)
Start with circles near the neck above collarbones, doing 10x each side. Deep Effective Breath   Standing, sitting, or laying down place both hands on the belly. Take a deep breath IN, expanding the belly; then breath OUT, contracting the belly. Repeat __5__ times. Do __2-3__ sessions per day and before each self massage.  Inguinal Nodes to Axilla - Clear   On involved side, at armpit, make _5__ in-place circles. Then from hip proceed in sections to armpit with stationary circles or pumps _5_ times, this is your pathway. Do _1__ time per day.  Copyright  VHI. All rights reserved.  LEG: Knee to Hip - Clear   Pump up outer thigh of involved leg from knee to outer hip. Then do stationary circles from inner to outer thigh, then do outer thigh again. Next, interlace fingers behind knee IF ABLE and make in-place circles. Do _5_ times of each sequence.  Do _1__ time per day.  Copyright  VHI. All rights reserved.  LEG: Ankle to Hip Sweep   Hands on sides of ankle of involved leg, pump _5__ times up both sides of lower leg, then retrace steps up outer thigh to hip as before and back to pathway. Do _2-3_ times. Do __1_ time per day.  Copyright  VHI. All rights reserved.  FOOT: Dorsum of Foot and Toes Massage   One hand on top of foot make _5_ stationary circles or pumps, then either on top of toes or each individual toe do _5_ pumps. Then retrace all steps pumping back up both sides of lower leg, outer thigh, and then pathway. Finish with what you started with, _5_ circles at involved side arm pit. All _2-3_ times at each sequence. Do _1__ time per day.

## 2019-11-04 ENCOUNTER — Ambulatory Visit: Payer: Self-pay | Admitting: Radiation Oncology

## 2019-11-05 ENCOUNTER — Other Ambulatory Visit: Payer: Self-pay

## 2019-11-05 ENCOUNTER — Ambulatory Visit: Payer: Medicaid Other | Admitting: Rehabilitation

## 2019-11-05 DIAGNOSIS — I890 Lymphedema, not elsewhere classified: Secondary | ICD-10-CM

## 2019-11-05 DIAGNOSIS — R262 Difficulty in walking, not elsewhere classified: Secondary | ICD-10-CM

## 2019-11-05 DIAGNOSIS — C530 Malignant neoplasm of endocervix: Secondary | ICD-10-CM

## 2019-11-05 NOTE — Therapy (Signed)
[place], [place], [address] Phone: [phone]   Fax:  [phone]  Physical Therapy Treatment  Patient Details  Name: Sheena Torres MRN: [id number] Date of Birth: 1965/02/17 Referring Provider (PT): Alvy Bimler   Encounter Date: 11/05/2019   PT End of Session - 11/05/19 1218    Visit Number 3    Number of Visits 13    Date for PT Re-Evaluation 11/30/19    Authorization - Visit Number 3    Authorization - Number of Visits 27    PT Start Time 1004    PT Stop Time 1056    PT Time Calculation (min) 52 min    Activity Tolerance Patient tolerated treatment well    Behavior During Therapy [facility] for tasks assessed/performed           Past Medical History:  Diagnosis Date  . ADHD   . Anemia   . Bilateral edema of lower extremity    08-23-2019  per pt wears compression hose  . Bilateral ureteral obstruction    urologist--- dr winter  . Cervical cancer [facility]) oncologist-- dr gorsuch/  dr Sondra Come   Stage IIB, SCC--- chemo 05-14-2018 to 06-26-2018 and started chemo 05-28-2019 weekly;  completed IMRT w/ last 5 at high dose 05-14-2018  to 07-27-2018  . Chemotherapy-induced nausea    08-23-2019  per pt intermittant  . Chronic cystitis   . Chronic pain    due to chemo  . Fatigue   . Hematuria    08-23-2019  per pt intermittant  . IBS (irritable bowel syndrome)    w/ diarrhea  . Migraine   . Nocturia more than twice per night   . Pancytopenia, acquired ([place])   . Port-A-Cath in place 05/08/2018  . Sensation of pressure in bladder area    occasional due to radiation therapy  . Urgency of urination    08-23-2019 mostly during the day    Past Surgical History:  Procedure Laterality Date  . BREAST ENHANCEMENT SURGERY Bilateral 2017   w/ implants  . CESAREAN SECTION  2000  . CYSTOSCOPY WITH STENT PLACEMENT Bilateral 05/26/2019   Procedure: CYSTOSCOPY WITH BILATERAL  STENT PLACEMENT;  Surgeon: Ceasar Mons, MD;  [facility];  Service: Urology;  Laterality: Bilateral;  . CYSTOSCOPY WITH STENT PLACEMENT Bilateral 09/01/2019   Procedure: CYSTOSCOPY WITH STENT EXCHANGE/ RETROGRADE;  Surgeon: Ceasar Mons, MD;  Location[facility];  Service: Urology;  Laterality: Bilateral;  ONLY NEEDS 30 MIN  . IR IMAGING GUIDED PORT INSERTION  05/08/2018  . IR REMOVAL TUN ACCESS W/ PORT W/O FL MOD SED  11/16/2018  . OPERATIVE ULTRASOUND N/A 07/02/2018   Procedure: OPERATIVE ULTRASOUND;  Surgeon: Gery Pray, MD;  Location: [facility];  Service: Urology;  Laterality: N/A;  . OPERATIVE ULTRASOUND N/A 07/09/2018   Procedure: OPERATIVE ULTRASOUND;  Surgeon: Gery Pray, MD;  Location: [facility];  Service: Urology;  Laterality: N/A;  . OPERATIVE ULTRASOUND N/A 07/13/2018   Procedure: OPERATIVE ULTRASOUND;  Surgeon: Gery Pray, MD;  Location: [facility];  Service: Urology;  Laterality: N/A;  . OPERATIVE ULTRASOUND N/A 07/21/2018   Procedure: OPERATIVE ULTRASOUND;  Surgeon: Gery Pray, MD;  Location: [facility];  Service: Urology;  Laterality: N/A;  . OPERATIVE ULTRASOUND N/A 07/27/2018   Procedure: OPERATIVE ULTRASOUND;  Surgeon: Gery Pray, MD;  Location: [facility];  Service: Urology;  Laterality: N/A;  . TANDEM RING  INSERTION N/A 07/02/2018   Procedure: TANDEM RING INSERTION;  Surgeon: Gery Pray, MD;  Location: [facility];  Service: Urology;  Laterality: N/A;  . TANDEM RING INSERTION N/A 07/09/2018   Procedure: TANDEM RING INSERTION;  Surgeon: Gery Pray, MD;  Location: [facility];  Service: Urology;  Laterality: N/A;  . TANDEM RING INSERTION N/A 07/13/2018   Procedure: TANDEM RING INSERTION;  Surgeon: Gery Pray, MD;  Location: [facility];  Service: Urology;  Laterality: N/A;  . TANDEM RING INSERTION N/A 07/21/2018   Procedure:  TANDEM RING INSERTION;  Surgeon: Gery Pray, MD;  Location: [facility];  Service: Urology;  Laterality: N/A;  . TANDEM RING INSERTION N/A 07/27/2018   Procedure: TANDEM RING INSERTION;  Surgeon: Gery Pray, MD;  Location: [facility];  Service: Urology;  Laterality: N/A;    There were no vitals filed for this visit.   Subjective Assessment - 11/05/19 1206    Subjective I was able to keep the bandages on well.  I did well and was able to urinate alot. The bandages got loose . Pt brought shapewear shorts that seem appropriate.   Pertinent History cervical cancer, stage IIB, radiation 05/14/18-06/24/18, 04/16/18 cervical cone biopsy, Sept 2020 was cancer free, Feb 2021- recurrence - tumour on L side of uterus, another tumor between kidney and bladder, pt recently stopped chemo and switched to immunotherapy,    Patient Stated Goals to get the swelling in the right leg down    Currently in Pain? No/denies                       [facility]patient [facility]  Lymphedema Life Impact Scale Total Score 39.71 %            [facility] Adult PT Treatment/Exercise - 11/05/19 0001      Manual Therapy   Manual Lymphatic Drainage (MLD) In Supine: Short neck, 5 diaphragmatic breaths, Rt axillary and Lt inguinal nodes, Rt inguino-axillary and anterior inter-inguinal anastomosis, then focused on lower Rt abdomen directing towards inguinao-axillary anastomosis, (did not cont with ant inter-inguinal due to abdominal swelling) Rt LE working from proximal to distal then retracing all steps and beginning to instruct pt and son while performing.    Compression Bandaging Used elastomull to toes 1-3 with stockinette foot to thigh and artiflex foot to thigh. 6 cm wrap to foot and ankle, 12 cm to knee and additional 12 cm to thigh..  Instructed son on toe wrapping, proper pressures and wrapping at home                        PT Long Term Goals - 10/26/19 1215      PT LONG TERM GOAL #1   Title Pt will demonstrate a 3 cm reduction in edema at 20 cm proximal to floor at lateral plantar foot to decrease risk of cellulitis.    Baseline R 32 cm    Time 5    Period Weeks    Status New    Target Date 11/30/19      PT LONG TERM GOAL #2   Title Pt will demonstrate a 5 cm decrease in edema at 20 cm proximal to suprapatella to decrease risk of infection.    Baseline R 59 cm    Time 5    Period Weeks    Status New    Target Date 11/30/19  PT LONG TERM GOAL #3   Title Pt will be independent in management of lymphedema through self MLD and compression garments.    Time 5    Period Weeks    Status New    Target Date 11/30/19      PT LONG TERM GOAL #4   Title Pt would benefit from appropriate day and nightitme compression garments for long term management of lymphedema.    Time 5    Period Weeks    Status New    Target Date 11/30/19      PT LONG TERM GOAL #5   Title Pt would benefit from a trial of FlexiTouch compression pump for long term management of lymphedema.    Time 5    Period Weeks    Status New    Target Date 11/30/19                 Plan - 11/05/19 1221    Clinical Impression Statement Pt tolerated knee high bandaging well and thigh component was added today. Pt. still requires moderate VC's for MLD but improved after her first session.  Pts. son will attempt to rebandage over the weekend.  Will benefit from further review.    PT Frequency 3x / week    PT Duration Other (comment)   5 weeks   PT Treatment/Interventions ADLs/Self Care Home Management;Therapeutic exercise;Therapeutic activities;Patient/family education;Manual techniques;Manual lymph drainage;Compression bandaging;Taping;Passive range of motion;Vasopneumatic Device    PT Next Visit Plan Cont and instruct MLD and compression bandaging (not too tight due to kidney issues); if tolerated okay (no  der in urinary output) add bandages working to thigh; get shapewear shorts. try large piece of rectangle shaped gray compression foam here?    PT Home Exercise Plan Self MLD;    Consulted and Agree with Plan of Care Patient;Family member/caregiver           Patient will benefit from skilled therapeutic intervention in order to improve the following deficits and impairments:  Increased edema, Pain, Difficulty walking  Visit Diagnosis: Lymphedema, not elsewhere classified  Difficulty in walking, not elsewhere classified  Malignant neoplasm of endocervix [facility])     Problem List Patient Active Problem List   Diagnosis Date Noted  . Preventive measure 10/28/2019  . UTI (urinary tract infection) 07/30/2019  . Edema of right lower extremity 06/18/2019  . Anemia due to antineoplastic chemotherapy 06/18/2019  . Mucositis due to antineoplastic therapy 06/18/2019  . Financial difficulties 05/21/2019  . Bilateral hydronephrosis 05/18/2019  . Peripheral neuropathy due to chemotherapy ([place]) 09/25/2018  . Pancytopenia, acquired ([place]) 06/05/2018  . Dysuria 06/04/2018  . Cancer associated pain 05/01/2018  . Other constipation 05/01/2018  . Physical debility 05/01/2018  . Goals of care, counseling/discussion 05/01/2018  . Malignant neoplasm of endocervix ([place]) 04/22/2018  . 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 ADHD (attention deficit hyperactivity disorder), inattentive type 10/11/2015    Stark Bray 11/05/2019, 12:32 PM  [place], [place], [address] Phone: [phone]   Fax:  [phone]  Name: Sheena Torres MRN: [id number] Date of Birth: September 23, 1965

## 2019-11-05 NOTE — Patient Instructions (Signed)
Self bandaging the leg:   Follow along with this video:  https://www.youtube.com/watch?[email or web address]  -OR-  Search in your internet browser: "self bandaging leg MD [place]"   And the video should appear in the video search results "Lymphedema Management: Self bandaging your leg" on YouTube   This video is for caregiver bandaging:  https://www.youtube.com/watch?v=vifdtDF21jA  -OR-  Search "Lymphedema management: Caregiver bandaging your leg" in your search browser and the video should appear in the search results     

## 2019-11-08 ENCOUNTER — Other Ambulatory Visit: Payer: Self-pay

## 2019-11-08 ENCOUNTER — Ambulatory Visit: Payer: Medicaid Other

## 2019-11-08 DIAGNOSIS — R262 Difficulty in walking, not elsewhere classified: Secondary | ICD-10-CM

## 2019-11-08 DIAGNOSIS — I890 Lymphedema, not elsewhere classified: Secondary | ICD-10-CM

## 2019-11-08 DIAGNOSIS — C530 Malignant neoplasm of endocervix: Secondary | ICD-10-CM

## 2019-11-08 NOTE — Therapy (Signed)
[place], [place], [address] Phone: [phone]   Fax:  [phone]  Physical Therapy Treatment  Patient Details  Name: Sheena Torres MRN: [id number] Date of Birth: 1965/06/05 Referring Provider (PT): Alvy Bimler   Encounter Date: 11/08/2019   PT End of Session - 11/08/19 1622    Visit Number 4    Number of Visits 13    Date for PT Re-Evaluation 11/30/19    Authorization Type Medicaid UHC    Authorization - Visit Number 4    Authorization - Number of Visits 27    PT Start Time 1505    PT Stop Time 1605    PT Time Calculation (min) 60 min    Activity Tolerance Patient tolerated treatment well    Behavior During Therapy [facility] for tasks assessed/performed           Past Medical History:  Diagnosis Date  . ADHD   . Anemia   . Bilateral edema of lower extremity    08-23-2019  per pt wears compression hose  . Bilateral ureteral obstruction    urologist--- dr winter  . Cervical cancer [facility]) oncologist-- dr gorsuch/  dr Sondra Come   Stage IIB, SCC--- chemo 05-14-2018 to 06-26-2018 and started chemo 05-28-2019 weekly;  completed IMRT w/ last 5 at high dose 05-14-2018  to 07-27-2018  . Chemotherapy-induced nausea    08-23-2019  per pt intermittant  . Chronic cystitis   . Chronic pain    due to chemo  . Fatigue   . Hematuria    08-23-2019  per pt intermittant  . IBS (irritable bowel syndrome)    w/ diarrhea  . Migraine   . Nocturia more than twice per night   . Pancytopenia, acquired ([place])   . Port-A-Cath in place 05/08/2018  . Sensation of pressure in bladder area    occasional due to radiation therapy  . Urgency of urination    08-23-2019 mostly during the day    Past Surgical History:  Procedure Laterality Date  . BREAST ENHANCEMENT SURGERY Bilateral 2017   w/ implants  . CESAREAN SECTION  2000  . CYSTOSCOPY WITH STENT PLACEMENT Bilateral 05/26/2019   Procedure: CYSTOSCOPY WITH BILATERAL   STENT PLACEMENT;  Surgeon: Ceasar Mons, MD;  Location: [facility];  Service: Urology;  Laterality: Bilateral;  . CYSTOSCOPY WITH STENT PLACEMENT Bilateral 09/01/2019   Procedure: CYSTOSCOPY WITH STENT EXCHANGE/ RETROGRADE;  Surgeon: Ceasar Mons, MD;  [facility];  Service: Urology;  Laterality: Bilateral;  ONLY NEEDS 30 MIN  . IR IMAGING GUIDED PORT INSERTION  05/08/2018  . IR REMOVAL TUN ACCESS W/ PORT W/O FL MOD SED  11/16/2018  . OPERATIVE ULTRASOUND N/A 07/02/2018   Procedure: OPERATIVE ULTRASOUND;  Surgeon: Gery Pray, MD;  Location: [facility];  Service: Urology;  Laterality: N/A;  . OPERATIVE ULTRASOUND N/A 07/09/2018   Procedure: OPERATIVE ULTRASOUND;  Surgeon: Gery Pray, MD;  Location: [facility];  Service: Urology;  Laterality: N/A;  . OPERATIVE ULTRASOUND N/A 07/13/2018   Procedure: OPERATIVE ULTRASOUND;  Surgeon: Gery Pray, MD;  Location: [facility];  Service: Urology;  Laterality: N/A;  . OPERATIVE ULTRASOUND N/A 07/21/2018   Procedure: OPERATIVE ULTRASOUND;  Surgeon: Gery Pray, MD;  Location: [facility];  Service: Urology;  Laterality: N/A;  . OPERATIVE ULTRASOUND N/A 07/27/2018   Procedure: OPERATIVE ULTRASOUND;  Surgeon: Gery Pray, MD;  Location: [facility];  Service: Urology;  Laterality: N/A;  . TANDEM RING INSERTION N/A 07/02/2018   Procedure: TANDEM RING INSERTION;  Surgeon: Gery Pray, MD;  Location: [facility];  Service: Urology;  Laterality: N/A;  . TANDEM RING INSERTION N/A 07/09/2018   Procedure: TANDEM RING INSERTION;  Surgeon: Gery Pray, MD;  Location: [facility];  Service: Urology;  Laterality: N/A;  . TANDEM RING INSERTION N/A 07/13/2018   Procedure: TANDEM RING INSERTION;  Surgeon: Gery Pray, MD;  Location: [facility];  Service: Urology;  Laterality: N/A;  . TANDEM RING  INSERTION N/A 07/21/2018   Procedure: TANDEM RING INSERTION;  Surgeon: Gery Pray, MD;  Location: [place] [facility];  Service: Urology;  Laterality: N/A;  . TANDEM RING INSERTION N/A 07/27/2018   Procedure: TANDEM RING INSERTION;  Surgeon: Gery Pray, MD;  Location: [facility];  Service: Urology;  Laterality: N/A;    There were no vitals filed for this visit.   Subjective Assessment - 11/08/19 1525    Subjective I started having swelling                 LYMPHEDEMA/ONCOLOGY QUESTIONNAIRE - 11/08/19 0001      Right Lower Extremity Lymphedema   20 cm Proximal to Suprapatella 58.9 cm    10 cm Proximal to Suprapatella 53.2 cm    At Midpatella/Popliteal Crease 36.7 cm    30 cm Proximal to Floor at Lateral Plantar Foot 37.6 cm    20 cm Proximal to Floor at Lateral Plantar Foot 30.7 1    10  cm Proximal to Floor at Lateral Malleoli 22.6 cm    5 cm Proximal to 1st MTP Joint 21.6 cm    Across MTP Joint 21.6 cm    Around Proximal Great Toe 7.4 cm    Other level ASIS - 108.6       Left Lower Extremity Lymphedema   20 cm Proximal to Suprapatella 53.1 cm    10 cm Proximal to Suprapatella 47.6 cm    At Midpatella/Popliteal Crease 37.2 cm    30 cm Proximal to Floor at Lateral Plantar Foot 36 cm    20 cm Proximal to Floor at Lateral Plantar Foot 27.5 cm    10 cm Proximal to Floor at Lateral Malleoli 20.8 cm    5 cm Proximal to 1st MTP Joint 21 cm    Across MTP Joint 21.7 cm    Around Proximal Great Toe 7.2 cm                [facility]  Lymphedema Life Impact Scale Total Score 39.71 %            [facility] Adult PT Treatment/Exercise - 11/08/19 0001      Self-Care   Self-Care Other Self-Care Comments    Other Self-Care Comments  Spent most of session answering pts questions and listening to concerns about new swelling in Lt upper thigh that began this weekend. Educated pt that  this was a possibility due to location of tumor but we will stop bandaging for now and pt is agreeable to ordering new bil thigh highs, see below. Also instructed pt that if she feels wither leg is beginning to swell again to resume bandaging that LE. Pt and son, who assists with bandaging mom, verbalized understanding.       Manual Therapy   Manual therapy comments Remeasured circumference of bil LE's and then measured pt for bil thigh highs  for [employer]: Average height, size small for Lt and med for Rt. Son issued this info.                        PT Long Term Goals - 10/26/19 1215      PT LONG TERM GOAL #1   Title Pt will demonstrate a 3 cm reduction in edema at 20 cm proximal to floor at lateral plantar foot to decrease risk of cellulitis.    Baseline R 32 cm    Time 5    Period Weeks    Status New    Target Date 11/30/19      PT LONG TERM GOAL #2   Title Pt will demonstrate a 5 cm decrease in edema at 20 cm proximal to suprapatella to decrease risk of infection.    Baseline R 59 cm    Time 5    Period Weeks    Status New    Target Date 11/30/19      PT LONG TERM GOAL #3   Title Pt will be independent in management of lymphedema through self MLD and compression garments.    Time 5    Period Weeks    Status New    Target Date 11/30/19      PT LONG TERM GOAL #4   Title Pt would benefit from appropriate day and nightitme compression garments for long term management of lymphedema.    Time 5    Period Weeks    Status New    Target Date 11/30/19      PT LONG TERM GOAL #5   Title Pt would benefit from a trial of FlexiTouch compression pump for long term management of lymphedema.    Time 5    Period Weeks    Status New    Target Date 11/30/19                 Plan - 11/08/19 1628    Clinical Impression Statement Pt comes in today with c/o that her Lt upper thigh began swelling yesterday so removed Rt LE bandages at that time. After spending  most of session answering pts questions and listening to new concerns, and after conferring with Blaire Breedlove Blu, PT, decided it would be best for pt to wear old compression stockings for now until new ones can be ordered (they plan on doing this tonight, info issued, see flowsheet). Pt reports did self MLD to Rt LE this morning so suggested same for Lt LE this evening (ran out of time today).  Also educated her that once Flexitouch arrives she should probably do one leg a day if able, and at first skip a day between to see how urinary output is (should be some increase with leg circumference decrease).Pt verbalized understanding all and reports feeling better about situation by end of session. (sending demographics to [facility] for garments but since this takes awhile pt reports son will help her get a pair now).    Personal Factors and Comorbidities Comorbidity 1    Comorbidities bilateral kidney stents, pancytopenia    Examination-Activity Limitations Locomotion Level    Examination-Participation Restrictions Community Activity;Cleaning;Occupation    Stability/Clinical Decision Making Stable/Uncomplicated    Rehab Potential Good    PT Frequency 3x / week    PT Duration Other (comment)   5 weeks   PT Treatment/Interventions ADLs/Self Care Home Management;Therapeutic exercise;Therapeutic activities;Patient/family education;Manual techniques;Manual lymph drainage;Compression bandaging;Taping;Passive range of motion;Vasopneumatic Device  PT Next Visit Plan How is bil Le cirucmference since stopping bandaging and wearing old compression stockings along with self MLD? Get new ones ordered? Cont bil LE MLD and cont assessing pts response to treatment    PT Home Exercise Plan Self MLD;    Recommended Other Services Demo sheet sent to [facility] for bil thigh high garments    Consulted and Agree with Plan of Care Patient;Family member/caregiver    Family Member Consulted son, Grayce Sessions           Patient  will benefit from skilled therapeutic intervention in order to improve the following deficits and impairments:  Increased edema, Pain, Difficulty walking  Visit Diagnosis: Lymphedema, not elsewhere classified  Difficulty in walking, not elsewhere classified  Malignant neoplasm of endocervix [facility])     Problem List Patient Active Problem List   Diagnosis Date Noted  . Preventive measure 10/28/2019  . UTI (urinary tract infection) 07/30/2019  . Edema of right lower extremity 06/18/2019  . Anemia due to antineoplastic chemotherapy 06/18/2019  . Mucositis due to antineoplastic therapy 06/18/2019  . Financial difficulties 05/21/2019  . Bilateral hydronephrosis 05/18/2019  . Peripheral neuropathy due to chemotherapy ([place]) 09/25/2018  . Pancytopenia, acquired ([place]) 06/05/2018  . Dysuria 06/04/2018  . Cancer associated pain 05/01/2018  . Other constipation 05/01/2018  . Physical debility 05/01/2018  . Goals of care, counseling/discussion 05/01/2018  . Malignant neoplasm of endocervix ([place]) 04/22/2018  . 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 ADHD (attention deficit hyperactivity disorder), inattentive type 10/11/2015    Otelia Limes 11/08/2019, 4:39 PM  [place], [place], [address] Phone: [phone]   Fax:  [phone]  Name: Sheena Torres MRN: [id number] Date of Birth: Oct 20, 1965

## 2019-11-09 ENCOUNTER — Other Ambulatory Visit (HOSPITAL_COMMUNITY): Payer: Self-pay | Admitting: Urology

## 2019-11-09 ENCOUNTER — Other Ambulatory Visit: Payer: Self-pay | Admitting: Urology

## 2019-11-09 MED FILL — TAMSULOSIN HCL 0.4 MG CAP: 0.4 | 30 days supply | Qty: 60 | Fill #0

## 2019-11-10 ENCOUNTER — Other Ambulatory Visit: Payer: Self-pay

## 2019-11-10 ENCOUNTER — Ambulatory Visit: Payer: Medicaid Other | Admitting: Physical Therapy

## 2019-11-10 ENCOUNTER — Encounter: Payer: Self-pay | Admitting: Physical Therapy

## 2019-11-10 DIAGNOSIS — I890 Lymphedema, not elsewhere classified: Secondary | ICD-10-CM | POA: Diagnosis not present

## 2019-11-10 NOTE — Therapy (Signed)
[place], [place], [address] Phone: [phone]   Fax:  [phone]  Physical Therapy Treatment  Patient Details  Name: Sheena Torres MRN: [id number] Date of Birth: January 30, 1966 Referring Provider (PT): Alvy Bimler   Encounter Date: 11/10/2019   PT End of Session - 11/10/19 1206    Visit Number 5    Number of Visits 13    Date for PT Re-Evaluation 11/30/19    Authorization Type Medicaid UHC    Authorization - Visit Number 5    Authorization - Number of Visits 27    PT Start Time 1003    PT Stop Time 1104    PT Time Calculation (min) 61 min    Activity Tolerance Patient tolerated treatment well    Behavior During Therapy [facility] for tasks assessed/performed           Past Medical History:  Diagnosis Date  . ADHD   . Anemia   . Bilateral edema of lower extremity    08-23-2019  per pt wears compression hose  . Bilateral ureteral obstruction    urologist--- dr winter  . Cervical cancer [facility]) oncologist-- dr gorsuch/  dr Sondra Come   Stage IIB, SCC--- chemo 05-14-2018 to 06-26-2018 and started chemo 05-28-2019 weekly;  completed IMRT w/ last 5 at high dose 05-14-2018  to 07-27-2018  . Chemotherapy-induced nausea    08-23-2019  per pt intermittant  . Chronic cystitis   . Chronic pain    due to chemo  . Fatigue   . Hematuria    08-23-2019  per pt intermittant  . IBS (irritable bowel syndrome)    w/ diarrhea  . Migraine   . Nocturia more than twice per night   . Pancytopenia, acquired ([place])   . Port-A-Cath in place 05/08/2018  . Sensation of pressure in bladder area    occasional due to radiation therapy  . Urgency of urination    08-23-2019 mostly during the day    Past Surgical History:  Procedure Laterality Date  . BREAST ENHANCEMENT SURGERY Bilateral 2017   w/ implants  . CESAREAN SECTION  2000  . CYSTOSCOPY WITH STENT PLACEMENT Bilateral 05/26/2019   Procedure: CYSTOSCOPY WITH BILATERAL   STENT PLACEMENT;  Surgeon: Ceasar Mons, MD;  [facility];  Service: Urology;  Laterality: Bilateral;  . CYSTOSCOPY WITH STENT PLACEMENT Bilateral 09/01/2019   Procedure: CYSTOSCOPY WITH STENT EXCHANGE/ RETROGRADE;  Surgeon: Ceasar Mons, MD;  Location: [facility];  Service: Urology;  Laterality: Bilateral;  ONLY NEEDS 30 MIN  . IR IMAGING GUIDED PORT INSERTION  05/08/2018  . IR REMOVAL TUN ACCESS W/ PORT W/O FL MOD SED  11/16/2018  . OPERATIVE ULTRASOUND N/A 07/02/2018   Procedure: OPERATIVE ULTRASOUND;  Surgeon: Gery Pray, MD;  Location: [facility];  Service: Urology;  Laterality: N/A;  . OPERATIVE ULTRASOUND N/A 07/09/2018   Procedure: OPERATIVE ULTRASOUND;  Surgeon: Gery Pray, MD;  Location: [facility];  Service: Urology;  Laterality: N/A;  . OPERATIVE ULTRASOUND N/A 07/13/2018   Procedure: OPERATIVE ULTRASOUND;  Surgeon: Gery Pray, MD;  Location: [facility];  Service: Urology;  Laterality: N/A;  . OPERATIVE ULTRASOUND N/A 07/21/2018   Procedure: OPERATIVE ULTRASOUND;  Surgeon: Gery Pray, MD;  Location: [facility];  Service: Urology;  Laterality: N/A;  . OPERATIVE ULTRASOUND N/A 07/27/2018   Procedure: OPERATIVE ULTRASOUND;  Surgeon: Gery Pray, MD;  Location: [facility];  Service: Urology;  Laterality: N/A;  . TANDEM RING INSERTION N/A 07/02/2018   Procedure: TANDEM RING INSERTION;  Surgeon: Gery Pray, MD;  Location: [facility];  Service: Urology;  Laterality: N/A;  . TANDEM RING INSERTION N/A 07/09/2018   Procedure: TANDEM RING INSERTION;  Surgeon: Gery Pray, MD;  Location: [facility];  Service: Urology;  Laterality: N/A;  . TANDEM RING INSERTION N/A 07/13/2018   Procedure: TANDEM RING INSERTION;  Surgeon: Gery Pray, MD;  Location: [facility];  Service: Urology;  Laterality: N/A;  . TANDEM RING  INSERTION N/A 07/21/2018   Procedure: TANDEM RING INSERTION;  Surgeon: Gery Pray, MD;  Location: [facility];  Service: Urology;  Laterality: N/A;  . TANDEM RING INSERTION N/A 07/27/2018   Procedure: TANDEM RING INSERTION;  Surgeon: Gery Pray, MD;  Location: [facility];  Service: Urology;  Laterality: N/A;    There were no vitals filed for this visit.   Subjective Assessment - 11/10/19 1008    Subjective My left leg swelling has gone down since we removed the bandages.    Pertinent History cervical cancer, stage IIB, radiation 05/14/18-06/24/18, 04/16/18 cervical cone biopsy, Sept 2020 was cancer free, Feb 2021- recurrence - tumour on L side of uterus, another tumor between kidney and bladder, pt recently stopped chemo and switched to immunotherapy,    Patient Stated Goals to get the swelling in the right leg down    Currently in Pain? No/denies    Pain Score 0-No pain                 LYMPHEDEMA/ONCOLOGY QUESTIONNAIRE - 11/10/19 0001      Right Lower Extremity Lymphedema   20 cm Proximal to Suprapatella 58 cm    10 cm Proximal to Suprapatella 51.4 cm    At Midpatella/Popliteal Crease 40.1 cm    30 cm Proximal to Floor at Lateral Plantar Foot 37 cm    20 cm Proximal to Floor at Lateral Plantar Foot 30.1 1    10  cm Proximal to Floor at Lateral Malleoli 23 cm    5 cm Proximal to 1st MTP Joint 21.2 cm    Across MTP Joint 22 cm    Around Proximal Great Toe 7.4 cm                Outpatient [facility]  Lymphedema Life Impact Scale Total Score 39.71 %            [facility] Adult PT Treatment/Exercise - 11/10/19 0001      Manual Therapy   Manual therapy comments remeasured circumferences    Manual Lymphatic Drainage (MLD) In Supine: Short neck, 5 diaphragmatic breaths, Rt axillary nodes, Rt inguino-axillary anastomosis, Rt LE working from proximal to distal then retracing all steps                         PT Long Term Goals - 10/26/19 1215      PT LONG TERM GOAL #1   Title Pt will demonstrate a 3 cm reduction in edema at 20 cm proximal to floor at lateral plantar foot to decrease risk of cellulitis.    Baseline R 32 cm    Time 5    Period Weeks    Status New    Target Date 11/30/19      PT LONG TERM GOAL #2   Title Pt will demonstrate a 5 cm decrease in  edema at 20 cm proximal to suprapatella to decrease risk of infection.    Baseline R 59 cm    Time 5    Period Weeks    Status New    Target Date 11/30/19      PT LONG TERM GOAL #3   Title Pt will be independent in management of lymphedema through self MLD and compression garments.    Time 5    Period Weeks    Status New    Target Date 11/30/19      PT LONG TERM GOAL #4   Title Pt would benefit from appropriate day and nightitme compression garments for long term management of lymphedema.    Time 5    Period Weeks    Status New    Target Date 11/30/19      PT LONG TERM GOAL #5   Title Pt would benefit from a trial of FlexiTouch compression pump for long term management of lymphedema.    Time 5    Period Weeks    Status New    Target Date 11/30/19                 Plan - 11/10/19 1206    Clinical Impression Statement Continued with MLD to RLE to continue to help reduce lymphedema. She has been wearing her compression stocking over the last few days. Bandages were discontinued due to an increase in swelling in the LUE. Remeasured RLE and it has decreased since last session except at the knee. Pt would still like to move forward with the compression pump.    Stability/Clinical Decision Making Stable/Uncomplicated    PT Frequency 3x / week    PT Duration --   5 weeks   PT Treatment/Interventions ADLs/Self Care Home Management;Therapeutic exercise;Therapeutic activities;Patient/family education;Manual techniques;Manual lymph drainage;Compression bandaging;Taping;Passive range of  motion;Vasopneumatic Device    PT Next Visit Plan How is bil Le cirucmference since stopping bandaging and wearing old compression stockings along with self MLD? Get new ones ordered? Cont bil LE MLD and cont assessing pts response to treatment    PT Home Exercise Plan Self MLD;    Consulted and Agree with Plan of Care Patient;Family member/caregiver    Family Member Consulted son, Grayce Sessions           Patient will benefit from skilled therapeutic intervention in order to improve the following deficits and impairments:  Increased edema, Pain, Difficulty walking  Visit Diagnosis: Lymphedema, not elsewhere classified     Problem List Patient Active Problem List   Diagnosis Date Noted  . Preventive measure 10/28/2019  . UTI (urinary tract infection) 07/30/2019  . Edema of right lower extremity 06/18/2019  . Anemia due to antineoplastic chemotherapy 06/18/2019  . Mucositis due to antineoplastic therapy 06/18/2019  . Financial difficulties 05/21/2019  . Bilateral hydronephrosis 05/18/2019  . Peripheral neuropathy due to chemotherapy ([place]) 09/25/2018  . Pancytopenia, acquired ([place]) 06/05/2018  . Dysuria 06/04/2018  . Cancer associated pain 05/01/2018  . Other constipation 05/01/2018  . Physical debility 05/01/2018  . Goals of care, counseling/discussion 05/01/2018  . Malignant neoplasm of endocervix ([place]) 04/22/2018  . 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 ADHD (attention deficit hyperactivity disorder), inattentive type 10/11/2015    Allyson Sabal [facility] 11/10/2019, 12:09 PM  [place], [place], [address] Phone: [phone]   Fax:  [phone]  Name: Sheena Torres MRN: [id number] Date of Birth: July 18, 1965  Allyson Sabal  Blue, PT 11/10/19 12:09 PM

## 2019-11-11 ENCOUNTER — Inpatient Hospital Stay: Payer: Medicaid Other

## 2019-11-11 ENCOUNTER — Telehealth: Payer: Self-pay | Admitting: Oncology

## 2019-11-11 ENCOUNTER — Other Ambulatory Visit: Payer: Self-pay

## 2019-11-11 ENCOUNTER — Other Ambulatory Visit: Payer: Self-pay | Admitting: Hematology and Oncology

## 2019-11-11 DIAGNOSIS — Z7189 Other specified counseling: Secondary | ICD-10-CM

## 2019-11-11 DIAGNOSIS — R300 Dysuria: Secondary | ICD-10-CM

## 2019-11-11 DIAGNOSIS — Z5111 Encounter for antineoplastic chemotherapy: Secondary | ICD-10-CM | POA: Diagnosis not present

## 2019-11-11 DIAGNOSIS — C530 Malignant neoplasm of endocervix: Secondary | ICD-10-CM

## 2019-11-11 DIAGNOSIS — D6481 Anemia due to antineoplastic chemotherapy: Secondary | ICD-10-CM

## 2019-11-11 DIAGNOSIS — T451X5A Adverse effect of antineoplastic and immunosuppressive drugs, initial encounter: Secondary | ICD-10-CM

## 2019-11-11 LAB — CBC WITH DIFFERENTIAL (CANCER CENTER ONLY)
Abs Immature Granulocytes: 0.01 10*3/uL (ref 0.00–0.07)
Basophils Absolute: 0 10*3/uL (ref 0.0–0.1)
Basophils Relative: 1 %
Eosinophils Absolute: 0.1 10*3/uL (ref 0.0–0.5)
Eosinophils Relative: 2 %
HCT: 28.5 % — ABNORMAL LOW (ref 36.0–46.0)
Hemoglobin: 9.2 g/dL — ABNORMAL LOW (ref 12.0–15.0)
Immature Granulocytes: 0 %
Lymphocytes Relative: 28 %
Lymphs Abs: 1 10*3/uL (ref 0.7–4.0)
MCH: 32.9 pg (ref 26.0–34.0)
MCHC: 32.3 g/dL (ref 30.0–36.0)
MCV: 101.8 fL — ABNORMAL HIGH (ref 80.0–100.0)
Monocytes Absolute: 0.3 10*3/uL (ref 0.1–1.0)
Monocytes Relative: 9 %
Neutro Abs: 2 10*3/uL (ref 1.7–7.7)
Neutrophils Relative %: 60 %
Platelet Count: 310 10*3/uL (ref 150–400)
RBC: 2.8 MIL/uL — ABNORMAL LOW (ref 3.87–5.11)
RDW: 17.6 % — ABNORMAL HIGH (ref 11.5–15.5)
WBC Count: 3.4 10*3/uL — ABNORMAL LOW (ref 4.0–10.5)
nRBC: 0 % (ref 0.0–0.2)

## 2019-11-11 LAB — URINALYSIS, COMPLETE (UACMP) WITH MICROSCOPIC
Bacteria, UA: NONE SEEN
Bilirubin Urine: NEGATIVE
Glucose, UA: NEGATIVE mg/dL
Hgb urine dipstick: NEGATIVE
Ketones, ur: NEGATIVE mg/dL
Leukocytes,Ua: NEGATIVE
Nitrite: NEGATIVE
Protein, ur: NEGATIVE mg/dL
Specific Gravity, Urine: 1.004 — ABNORMAL LOW (ref 1.005–1.030)
pH: 6 (ref 5.0–8.0)

## 2019-11-11 LAB — CMP (CANCER CENTER ONLY)
ALT: 11 U/L (ref 0–44)
AST: 21 U/L (ref 15–41)
Albumin: 3.3 g/dL — ABNORMAL LOW (ref 3.5–5.0)
Alkaline Phosphatase: 93 U/L (ref 38–126)
Anion gap: 6 (ref 5–15)
BUN: 10 mg/dL (ref 6–20)
CO2: 29 mmol/L (ref 22–32)
Calcium: 9.5 mg/dL (ref 8.9–10.3)
Chloride: 102 mmol/L (ref 98–111)
Creatinine: 1.15 mg/dL — ABNORMAL HIGH (ref 0.44–1.00)
GFR, Est AFR Am: >60 mL/min (ref >60)
GFR, Estimated: 54 mL/min — ABNORMAL LOW (ref >60)
Glucose, Bld: 84 mg/dL (ref 70–99)
Potassium: 3.7 mmol/L (ref 3.5–5.1)
Sodium: 137 mmol/L (ref 135–145)
Total Bilirubin: 0.3 mg/dL (ref 0.3–1.2)
Total Protein: 6.5 g/dL (ref 6.5–8.1)

## 2019-11-11 LAB — SAMPLE TO BLOOD BANK

## 2019-11-11 LAB — TSH: TSH: 0.684 u[IU]/mL (ref 0.308–3.960)

## 2019-11-11 MED FILL — PHENAZOPYRIDINE 200 MG TAB: 200 | 10 days supply | Qty: 30 | Fill #0

## 2019-11-11 NOTE — Telephone Encounter (Signed)
Called [facility] back and scheduled lab appointment for 11:00 today.

## 2019-11-11 NOTE — Telephone Encounter (Signed)
Pls get her a lab appt I will order UA and Cx, ok to draw all other labs also

## 2019-11-11 NOTE — Telephone Encounter (Signed)
Sheena Torres called and said she has symptoms of a UTI and is wondering if she can come in today for a urinalysis/culture.  She said she is having a "pulling" sensation when she urinates for the past week.  This has been a symptom of a UTI in the past for her.

## 2019-11-12 ENCOUNTER — Telehealth: Payer: Self-pay

## 2019-11-12 ENCOUNTER — Telehealth: Payer: Self-pay | Admitting: Oncology

## 2019-11-12 LAB — URINE CULTURE: Culture: NO GROWTH

## 2019-11-12 LAB — T4: T4, Total: 8.8 ug/dL (ref 4.5–12.0)

## 2019-11-12 NOTE — Telephone Encounter (Signed)
-----   Message from Heath Lark, MD sent at 11/12/2019 10:59 AM EDT ----- Regarding: let her know urine culture is neg. her symptom could be spasm due to the stent

## 2019-11-12 NOTE — Telephone Encounter (Signed)
Called and given below message. She verbalized understanding. 

## 2019-11-12 NOTE — Telephone Encounter (Signed)
Sheena Torres called and asked about her urine culture results.  Advised her that the results came back this morning and showed no growth.  Advised her I will call her back if Dr. Alvy Bimler recommends anything for her.

## 2019-11-15 ENCOUNTER — Other Ambulatory Visit: Payer: Self-pay

## 2019-11-15 ENCOUNTER — Other Ambulatory Visit (HOSPITAL_COMMUNITY): Payer: Self-pay | Admitting: Hematology and Oncology

## 2019-11-15 ENCOUNTER — Ambulatory Visit: Payer: Medicaid Other | Attending: Hematology and Oncology | Admitting: Physical Therapy

## 2019-11-15 ENCOUNTER — Encounter: Payer: Self-pay | Admitting: Physical Therapy

## 2019-11-15 ENCOUNTER — Other Ambulatory Visit: Payer: Self-pay | Admitting: Hematology and Oncology

## 2019-11-15 ENCOUNTER — Telehealth: Payer: Self-pay | Admitting: Oncology

## 2019-11-15 DIAGNOSIS — I890 Lymphedema, not elsewhere classified: Secondary | ICD-10-CM | POA: Diagnosis not present

## 2019-11-15 DIAGNOSIS — G893 Neoplasm related pain (acute) (chronic): Secondary | ICD-10-CM

## 2019-11-15 DIAGNOSIS — R262 Difficulty in walking, not elsewhere classified: Secondary | ICD-10-CM | POA: Insufficient documentation

## 2019-11-15 MED ORDER — OXYCODONE-ACETAMINOPHEN 5-325 MG PO TABS: 2.0000 | ORAL_TABLET | Freq: Four times a day (QID) | ORAL | 0 refills | Status: DC | PRN

## 2019-11-15 MED FILL — OXYCODONE-APAP 5-325MG: 5-325 | 11 days supply | Qty: 90 | Fill #0

## 2019-11-15 NOTE — Telephone Encounter (Signed)
done

## 2019-11-15 NOTE — Therapy (Signed)
[place], [place], [address] Phone: [phone]   Fax:  [phone]  Physical Therapy Treatment  Patient Details  Name: Sheena Torres MRN: [id number] Date of Birth: 23-Apr-1965 Referring Provider (PT): Alvy Bimler   Encounter Date: 11/15/2019   PT End of Session - 11/15/19 1057    Visit Number 6    Number of Visits 13    Date for PT Re-Evaluation 11/30/19    Authorization Type Medicaid U[employer]    Authorization - Visit Number 6    Authorization - Number of Visits 27    PT Start Time 1006    PT Stop Time 1057    PT Time Calculation (min) 51 min    Activity Tolerance Patient tolerated treatment well    Behavior During Therapy [facility] for tasks assessed/performed           Past Medical History:  Diagnosis Date  . ADHD   . Anemia   . Bilateral edema of lower extremity    08-23-2019  per pt wears compression hose  . Bilateral ureteral obstruction    urologist--- dr winter  . Cervical cancer [facility]) oncologist-- dr gorsuch/  dr Sondra Come   Stage IIB, SCC--- chemo 05-14-2018 to 06-26-2018 and started chemo 05-28-2019 weekly;  completed IMRT w/ last 5 at high dose 05-14-2018  to 07-27-2018  . Chemotherapy-induced nausea    08-23-2019  per pt intermittant  . Chronic cystitis   . Chronic pain    due to chemo  . Fatigue   . Hematuria    08-23-2019  per pt intermittant  . IBS (irritable bowel syndrome)    w/ diarrhea  . Migraine   . Nocturia more than twice per night   . Pancytopenia, acquired ([place])   . Port-A-Cath in place 05/08/2018  . Sensation of pressure in bladder area    occasional due to radiation therapy  . Urgency of urination    08-23-2019 mostly during the day    Past Surgical History:  Procedure Laterality Date  . BREAST ENHANCEMENT SURGERY Bilateral 2017   w/ implants  . CESAREAN SECTION  2000  . CYSTOSCOPY WITH STENT PLACEMENT Bilateral 05/26/2019   Procedure: CYSTOSCOPY WITH BILATERAL   STENT PLACEMENT;  Surgeon: Ceasar Mons, MD;  Location: [facility];  Service: Urology;  Laterality: Bilateral;  . CYSTOSCOPY WITH STENT PLACEMENT Bilateral 09/01/2019   Procedure: CYSTOSCOPY WITH STENT EXCHANGE/ RETROGRADE;  Surgeon: Ceasar Mons, MD;  Location[facility];  Service: Urology;  Laterality: Bilateral;  ONLY NEEDS 30 MIN  . IR IMAGING GUIDED PORT INSERTION  05/08/2018  . IR REMOVAL TUN ACCESS W/ PORT W/O FL MOD SED  11/16/2018  . OPERATIVE ULTRASOUND N/A 07/02/2018   Procedure: OPERATIVE ULTRASOUND;  Surgeon: Gery Pray, MD;  Location: [facility];  Service: Urology;  Laterality: N/A;  . OPERATIVE ULTRASOUND N/A 07/09/2018   Procedure: OPERATIVE ULTRASOUND;  Surgeon: Gery Pray, MD;  Location: [facility];  Service: Urology;  Laterality: N/A;  . OPERATIVE ULTRASOUND N/A 07/13/2018   Procedure: OPERATIVE ULTRASOUND;  Surgeon: Gery Pray, MD;  Location: [facility];  Service: Urology;  Laterality: N/A;  . OPERATIVE ULTRASOUND N/A 07/21/2018   Procedure: OPERATIVE ULTRASOUND;  Surgeon: Gery Pray, MD;  Location: [facility];  Service: Urology;  Laterality: N/A;  . OPERATIVE ULTRASOUND N/A 07/27/2018   Procedure: OPERATIVE ULTRASOUND;  Surgeon: Gery Pray, MD;  Location: [facility];  Service: Urology;  Laterality: N/A;  . TANDEM RING INSERTION N/A 07/02/2018   Procedure: TANDEM RING INSERTION;  Surgeon: Gery Pray, MD;  Location: [facility];  Service: Urology;  Laterality: N/A;  . TANDEM RING INSERTION N/A 07/09/2018   Procedure: TANDEM RING INSERTION;  Surgeon: Gery Pray, MD;  Location: [facility];  Service: Urology;  Laterality: N/A;  . TANDEM RING INSERTION N/A 07/13/2018   Procedure: TANDEM RING INSERTION;  Surgeon: Gery Pray, MD;  Location: [facility];  Service: Urology;  Laterality: N/A;  . TANDEM RING  INSERTION N/A 07/21/2018   Procedure: TANDEM RING INSERTION;  Surgeon: Gery Pray, MD;  Location: [facility];  Service: Urology;  Laterality: N/A;  . TANDEM RING INSERTION N/A 07/27/2018   Procedure: TANDEM RING INSERTION;  Surgeon: Gery Pray, MD;  Location: [facility];  Service: Urology;  Laterality: N/A;    There were no vitals filed for this visit.   Subjective Assessment - 11/15/19 1009    Subjective Since I have been coming here I have not had any trouble urinating. It is coming out constantly.    Pertinent History cervical cancer, stage IIB, radiation 05/14/18-06/24/18, 04/16/18 cervical cone biopsy, Sept 2020 was cancer free, Feb 2021- recurrence - tumour on L side of uterus, another tumor between kidney and bladder, pt recently stopped chemo and switched to immunotherapy,    Patient Stated Goals to get the swelling in the right leg down    Currently in Pain? No/denies    Pain Score 0-No pain                       Outpatient Rehab from 10/26/2019 in [place]  Lymphedema Life Impact Scale Total Score 39.71 %            [facility] Adult PT Treatment/Exercise - 11/15/19 0001      Manual Therapy   Manual Lymphatic Drainage (MLD) In Supine: Short neck, 5 diaphragmatic breaths, Rt axillary nodes, Rt inguino-axillary anastomosis, Rt LE working from proximal to distal then retracing all steps                       PT Long Term Goals - 10/26/19 1215      PT LONG TERM GOAL #1   Title Pt will demonstrate a 3 cm reduction in edema at 20 cm proximal to floor at lateral plantar foot to decrease risk of cellulitis.    Baseline R 32 cm    Time 5    Period Weeks    Status New    Target Date 11/30/19      PT LONG TERM GOAL #2   Title Pt will demonstrate a 5 cm decrease in edema at 20 cm proximal to suprapatella to decrease risk of infection.    Baseline R 59 cm    Time 5    Period Weeks     Status New    Target Date 11/30/19      PT LONG TERM GOAL #3   Title Pt will be independent in management of lymphedema through self MLD and compression garments.    Time 5    Period Weeks    Status New    Target Date 11/30/19      PT LONG TERM GOAL #4   Title Pt would benefit from appropriate day and nightitme compression garments for long term management of lymphedema.    Time 5  Period Weeks    Status New    Target Date 11/30/19      PT LONG TERM GOAL #5   Title Pt would benefit from a trial of FlexiTouch compression pump for long term management of lymphedema.    Time 5    Period Weeks    Status New    Target Date 11/30/19                 Plan - 11/15/19 1058    Clinical Impression Statement Conitnued with MLD to RLE. Her foot and ankle appeared more swollen today with visible indentations from her shoes. Spent extra time on foot and ankle. Pt is awaiting arrival of flat knit compression garments.    PT Frequency 3x / week    PT Duration --   5 weeks   PT Treatment/Interventions ADLs/Self Care Home Management;Therapeutic exercise;Therapeutic activities;Patient/family education;Manual techniques;Manual lymph drainage;Compression bandaging;Taping;Passive range of motion;Vasopneumatic Device    PT Next Visit Plan How is bil Le cirucmference since stopping bandaging and wearing old compression stockings along with self MLD? Get new ones ordered? Cont bil LE MLD and cont assessing pts response to treatment    PT Home Exercise Plan Self MLD;    Consulted and Agree with Plan of Care Patient;Family member/caregiver    Family Member Consulted son, Grayce Sessions           Patient will benefit from skilled therapeutic intervention in order to improve the following deficits and impairments:  Increased edema, Pain, Difficulty walking  Visit Diagnosis: Lymphedema, not elsewhere classified  Difficulty in walking, not elsewhere classified     Problem List Patient Active Problem  List   Diagnosis Date Noted  . Preventive measure 10/28/2019  . UTI (urinary tract infection) 07/30/2019  . Edema of right lower extremity 06/18/2019  . Anemia due to antineoplastic chemotherapy 06/18/2019  . Mucositis due to antineoplastic therapy 06/18/2019  . Financial difficulties 05/21/2019  . Bilateral hydronephrosis 05/18/2019  . Peripheral neuropathy due to chemotherapy ([place]) 09/25/2018  . Pancytopenia, acquired ([place]) 06/05/2018  . Dysuria 06/04/2018  . Cancer associated pain 05/01/2018  . Other constipation 05/01/2018  . Physical debility 05/01/2018  . Goals of care, counseling/discussion 05/01/2018  . Malignant neoplasm of endocervix ([place]) 04/22/2018  . 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 ADHD (attention deficit hyperactivity disorder), inattentive type 10/11/2015    Allyson Sabal [facility] 11/15/2019, 10:59 AM  [place], [place], [address] Phone: [phone]   Fax:  [phone]  Name: Sheena Torres MRN: [id number] Date of Birth: 1965/12/13  Manus Gunning, PT 11/15/19 11:00 AM

## 2019-11-15 NOTE — Telephone Encounter (Signed)
Called [facility] back and let her know the refill has been sent. 

## 2019-11-15 NOTE — Telephone Encounter (Signed)
Sheena Torres called and requested a refill of Percocet to be sent to [facility].  She has enough left to last 2-3 days.

## 2019-11-17 ENCOUNTER — Encounter: Payer: Medicaid Other | Admitting: Physical Therapy

## 2019-11-18 ENCOUNTER — Other Ambulatory Visit: Payer: Self-pay

## 2019-11-18 ENCOUNTER — Inpatient Hospital Stay: Payer: Medicaid Other | Attending: Hematology and Oncology

## 2019-11-18 ENCOUNTER — Encounter: Payer: Self-pay | Admitting: Hematology and Oncology

## 2019-11-18 ENCOUNTER — Inpatient Hospital Stay (HOSPITAL_BASED_OUTPATIENT_CLINIC_OR_DEPARTMENT_OTHER): Payer: Medicaid Other | Admitting: Hematology and Oncology

## 2019-11-18 ENCOUNTER — Inpatient Hospital Stay: Payer: Medicaid Other

## 2019-11-18 VITALS — BP 113/75 | HR 88 | Temp 97.3°F | Resp 18 | Ht 62.0 in | Wt 150.2 lb

## 2019-11-18 DIAGNOSIS — Z7189 Other specified counseling: Secondary | ICD-10-CM

## 2019-11-18 DIAGNOSIS — N390 Urinary tract infection, site not specified: Secondary | ICD-10-CM | POA: Insufficient documentation

## 2019-11-18 DIAGNOSIS — R300 Dysuria: Secondary | ICD-10-CM

## 2019-11-18 DIAGNOSIS — T451X5A Adverse effect of antineoplastic and immunosuppressive drugs, initial encounter: Secondary | ICD-10-CM

## 2019-11-18 DIAGNOSIS — G893 Neoplasm related pain (acute) (chronic): Secondary | ICD-10-CM | POA: Diagnosis not present

## 2019-11-18 DIAGNOSIS — C530 Malignant neoplasm of endocervix: Secondary | ICD-10-CM

## 2019-11-18 DIAGNOSIS — Z79899 Other long term (current) drug therapy: Secondary | ICD-10-CM | POA: Insufficient documentation

## 2019-11-18 DIAGNOSIS — R600 Localized edema: Secondary | ICD-10-CM

## 2019-11-18 DIAGNOSIS — D6481 Anemia due to antineoplastic chemotherapy: Secondary | ICD-10-CM

## 2019-11-18 DIAGNOSIS — Z5112 Encounter for antineoplastic immunotherapy: Secondary | ICD-10-CM | POA: Diagnosis not present

## 2019-11-18 DIAGNOSIS — N3001 Acute cystitis with hematuria: Secondary | ICD-10-CM

## 2019-11-18 LAB — CMP (CANCER CENTER ONLY)
ALT: 10 U/L (ref 0–44)
AST: 17 U/L (ref 15–41)
Albumin: 2.9 g/dL — ABNORMAL LOW (ref 3.5–5.0)
Alkaline Phosphatase: 84 U/L (ref 38–126)
Anion gap: 5 (ref 5–15)
BUN: 10 mg/dL (ref 6–20)
CO2: 29 mmol/L (ref 22–32)
Calcium: 9 mg/dL (ref 8.9–10.3)
Chloride: 103 mmol/L (ref 98–111)
Creatinine: 0.87 mg/dL (ref 0.44–1.00)
GFR, Estimated: >60 mL/min (ref >60)
Glucose, Bld: 90 mg/dL (ref 70–99)
Potassium: 4.1 mmol/L (ref 3.5–5.1)
Sodium: 137 mmol/L (ref 135–145)
Total Bilirubin: 0.3 mg/dL (ref 0.3–1.2)
Total Protein: 5.9 g/dL — ABNORMAL LOW (ref 6.5–8.1)

## 2019-11-18 LAB — CBC WITH DIFFERENTIAL (CANCER CENTER ONLY)
Abs Immature Granulocytes: 0.02 10*3/uL (ref 0.00–0.07)
Basophils Absolute: 0 10*3/uL (ref 0.0–0.1)
Basophils Relative: 1 %
Eosinophils Absolute: 0.1 10*3/uL (ref 0.0–0.5)
Eosinophils Relative: 2 %
HCT: 26.1 % — ABNORMAL LOW (ref 36.0–46.0)
Hemoglobin: 8.5 g/dL — ABNORMAL LOW (ref 12.0–15.0)
Immature Granulocytes: 0 %
Lymphocytes Relative: 26 %
Lymphs Abs: 1.2 10*3/uL (ref 0.7–4.0)
MCH: 32.4 pg (ref 26.0–34.0)
MCHC: 32.6 g/dL (ref 30.0–36.0)
MCV: 99.6 fL (ref 80.0–100.0)
Monocytes Absolute: 0.5 10*3/uL (ref 0.1–1.0)
Monocytes Relative: 10 %
Neutro Abs: 2.9 10*3/uL (ref 1.7–7.7)
Neutrophils Relative %: 61 %
Platelet Count: 279 10*3/uL (ref 150–400)
RBC: 2.62 MIL/uL — ABNORMAL LOW (ref 3.87–5.11)
RDW: 17.2 % — ABNORMAL HIGH (ref 11.5–15.5)
WBC Count: 4.7 10*3/uL (ref 4.0–10.5)
nRBC: 0 % (ref 0.0–0.2)

## 2019-11-18 LAB — TSH: TSH: 0.962 u[IU]/mL (ref 0.308–3.960)

## 2019-11-18 LAB — URINALYSIS, COMPLETE (UACMP) WITH MICROSCOPIC
Bacteria, UA: NONE SEEN
Bilirubin Urine: NEGATIVE
Glucose, UA: NEGATIVE mg/dL
Hgb urine dipstick: NEGATIVE
Ketones, ur: NEGATIVE mg/dL
Leukocytes,Ua: NEGATIVE
Nitrite: NEGATIVE
Protein, ur: NEGATIVE mg/dL
Specific Gravity, Urine: 1.004 — ABNORMAL LOW (ref 1.005–1.030)
pH: 6 (ref 5.0–8.0)

## 2019-11-18 MED ORDER — SODIUM CHLORIDE 0.9 % IV SOLN
200.0000 mg | Freq: Once | INTRAVENOUS | Status: AC
  Administered 2019-11-18: 200 mg via INTRAVENOUS
  Filled 2019-11-18: qty 8

## 2019-11-18 MED ORDER — SODIUM CHLORIDE 0.9 % IV SOLN
Freq: Once | INTRAVENOUS | Status: AC
  Filled 2019-11-18: qty 250

## 2019-11-18 MED FILL — OXYBUTYNIN CHLORIDE 5 MG TA: 5 | 30 days supply | Qty: 90 | Fill #0

## 2019-11-18 NOTE — Assessment & Plan Note (Signed)
The cause of the anemia is multifactorial She is not symptomatic She does not need transfusion today However, I have requested her to return in 2 weeks to get her labs rechecked in case she need blood transfusion

## 2019-11-18 NOTE — Patient Instructions (Signed)
[facility] Discharge Instructions for Patients Receiving Chemotherapy  Today you received the following chemotherapy agents pembrolizumab (Keytruda)  To help prevent nausea and vomiting after your treatment, we encourage you to take your nausea medication as directed   If you develop nausea and vomiting that is not controlled by your nausea medication, call the clinic.   BELOW ARE SYMPTOMS THAT SHOULD BE REPORTED IMMEDIATELY:  *FEVER GREATER THAN 100.5 F  *CHILLS WITH OR WITHOUT FEVER  NAUSEA AND VOMITING THAT IS NOT CONTROLLED WITH YOUR NAUSEA MEDICATION  *UNUSUAL SHORTNESS OF BREATH  *UNUSUAL BRUISING OR BLEEDING  TENDERNESS IN MOUTH AND THROAT WITH OR WITHOUT PRESENCE OF ULCERS  *URINARY PROBLEMS  *BOWEL PROBLEMS  UNUSUAL RASH Items with * indicate a potential emergency and should be followed up as soon as possible.  Feel free to call the clinic should you have any questions or concerns. The clinic phone number is [phone].  Please show the CHEMO ALERT CARD at check-in to the Emergency Department and triage nurse.   

## 2019-11-18 NOTE — Assessment & Plan Note (Signed)
She has symptoms persistent dysuria I will order another set of urinalysis and urine culture We will call the patient with test results

## 2019-11-18 NOTE — Assessment & Plan Note (Signed)
Her pain is well controlled She will continue prescribed pain medicine

## 2019-11-18 NOTE — Assessment & Plan Note (Signed)
She tolerated pembrolizumab better Her leukopenia and thrombocytopenia improved Anemia is most consistent with anemia of chronic disease I recommend minimum 4 cycles of pembrolizumab before repeating imaging study

## 2019-11-18 NOTE — Assessment & Plan Note (Signed)
She is doing well with physical therapy We will continue for lymphedema treatment

## 2019-11-18 NOTE — Progress Notes (Signed)
[place] OFFICE PROGRESS NOTE  Patient Care Team: [name] as PCP - General (Physician Assistant) Jacqulyn Liner, RN as Oncology Nurse Navigator (Oncology)  ASSESSMENT & PLAN:  Malignant neoplasm of endocervix [facility]) She tolerated pembrolizumab better Her leukopenia and thrombocytopenia improved Anemia is most consistent with anemia of chronic disease I recommend minimum 4 cycles of pembrolizumab before repeating imaging study  Anemia due to antineoplastic chemotherapy The cause of the anemia is multifactorial She is not symptomatic She does not need transfusion today However, I have requested her to return in 2 weeks to get her labs rechecked in case she need blood transfusion  Cancer associated pain Her pain is well controlled She will continue prescribed pain medicine  Edema of right lower extremity She is doing well with physical therapy We will continue for lymphedema treatment  UTI (urinary tract infection) She has symptoms persistent dysuria I will order another set of urinalysis and urine culture We will call the patient with test results   Orders Placed This Encounter  Procedures  . Urine Culture    Standing Status:   Future    Number of Occurrences:   1    Standing Expiration Date:   11/17/2020  . Urinalysis, Complete w Microscopic    Standing Status:   Future    Number of Occurrences:   1    Standing Expiration Date:   11/17/2020    All questions were answered. The patient knows to call the clinic with any problems, questions or concerns. The total time spent in the appointment was 30 minutes encounter with patients including review of chart and various tests results, discussions about plan of care and coordination of care plan   Heath Lark, MD 11/18/2019 2:12 PM  INTERVAL HISTORY: Please see below for problem oriented charting. She returns with her 2 sons for cycle 2 of pembrolizumab She continues to have suprapubic discomfort  and mild dysuria Her right leg extremity edema is stable but she is noticing some left lower extremity edema The patient denies any recent signs or symptoms of bleeding such as spontaneous epistaxis, hematuria or hematochezia. No recent fever or chills Her pain is well controlled She denies recent constipation  SUMMARY OF ONCOLOGIC HISTORY: Oncology History Overview Note  PD-L1 5%   Malignant neoplasm of endocervix ([place])  09/15/2017 Initial Diagnosis   The patient reports postcoital bleeding since August, 2019 and postmenopausal bleeding since November, 2019. 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    04/16/2018 Surgery   She was taken to the OR on 04/16/18 for a cervical cone biopsy. The specimen revealed a grossly normal cervix on the ectocervix.  Cone specimen revealed squamous cell carcinoma.  When discussing this with the pathologist he reported that in the actual cone specimen itself that was CIN 3 however there was a separate fragment of tissue that included carcinoma.  The dimensions of this was 0.6 cm, however this involved the margins.  A post cone ECC was positive for squamous cell carcinoma, as well as an endometrial curettage which also contain benign endometrial glands.    04/16/2018 Pathology Results   Endocervix curettage: [facility]   04/22/2018 Initial Diagnosis   Malignant neoplasm of endocervix ([place])   04/24/2018 PET scan   1. There is a large mass involving the cervix and endometrium which has a maximum dimension of 5.3 cm within SUV  max of 20.4. 2. Small bilateral pelvic sidewall lymph nodes measuring less than 1cm with mild nonspecific uptake. The no hypermetabolic adenopathy or evidence of distant metastatic disease. 3. Nonspecific pulmonary nodules measuring less than 5 mm are identified in the right lung. Too small to characterize by PET-CT.    04/27/2018 Imaging   MRI pelvis  1. Uterine  cervix 5.5 x 3.4 x 3.2 cm mass compatible with primary cervical malignancy, with mild parametrial invasion. Stage IIB by MRI. 2. Small volume simple free fluid in the pelvic cul-de-sac. 3. No pathologically enlarged pelvic lymph nodes, see comments.   05/01/2018 Cancer Staging   Staging form: Cervix Uteri, AJCC 8th Edition - Clinical: FIGO Stage IIB (cT2b, cN0, cM0) - Signed by Gorsuch, Ni, MD on 05/01/2018   05/08/2018 Procedure   Placement of a subcutaneous port device. Catheter tip at the superior cavoatrial junction.   05/14/2018 - 07/27/2018 Radiation Therapy   Radiation treatment dates:    1. 05/14/2018 - 06/24/2018 2. 5/21, 5/28, 6/1, 6/9, 07/27/2018   Site/dose:    1.         A. pelvsi / 45 Gy in 25 fractions              B. Parametrial  boost / 9 Gy in 5 fractions 2. Cervix, Tandem and Ring System / 27.5 Gy in 5 fractions     05/15/2018 - 06/26/2018 Chemotherapy   The patient had weekly cisplatin   10/27/2018 PET scan   Near complete resolution of FDG uptake at site of previously seen cervical mass. Resolution of previously seen sub-cm pelvic lymph nodes. No evidence of metabolically-active metastatic disease.   05/13/2019 Imaging   1. There is ill-defined, hypoenhancing soft tissue most clearly visualized at the left aspect of the uterus and adnexa, which obstructs the distal left ureter, measuring approximately 4.0 x 3.4 cm. This is concerning for local recurrence of cervical malignancy.   2. Ill-defined, hypoenhancing appearance of the cervix with loss of distinction of the fat plane to the posterior urinary bladder, in keeping with primary malignancy.   3. Interval enlargement of numerous abnormal retroperitoneal and bilateral inguinal lymph nodes, consistent with nodal metastatic disease.    4. There is new moderate left, mild right hydronephrosis and hydroureter, with soft tissue described above obstructing the distal left ureter in the vicinity of the adnexa. There is high-grade  obstruction of the distal left ureter with no excreted contrast on delayed phase imaging. Point of obstruction of the right ureter is not clearly visualized, possibly at the ureterovesicular junction.      Genetic Testing   Patient has genetic testing done for PD-L1. Results revealed patient has the following: PD-L1 staining in tumor cells (TC): 5% PD-L1 staining in tumor associated immune cells (IC): <1% PD-L1 combined positive score (CPS): 5%   05/28/2019 -  Chemotherapy   The patient had carboplatin and taxol for chemotherapy treatment.     07/29/2019 Imaging   1. Mild improvement in mild left paraaortic retroperitoneal lymphadenopathy. No new or progressive metastatic disease identified. 2. No significant change in size of soft tissue mass in the left adnexa and parametrium. 3. Stable diffuse bladder wall thickening, consistent with cystitis and likely due to previous radiation therapy. 4. Interval placement of bilateral ureteral stents, with resolution of bilateral hydroureteronephrosis since prior study. 5. Stable diffuse left renal parenchymal atrophy. 6. Large stool burden noted; recommend clinical correlation for possible constipation.   09/01/2019 Surgery   Operative Note   Preoperative   diagnosis:  1.  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  Same   Procedure(s): 1.  Cystoscopy with bilateral stent exchange 2.  Bilateral retrograde pyelograms with intraoperative interpretation of fluoroscopic imaging   Surgeon: Ellison Hughs, MD    Specimens: 1.  Previously placed bilateral JJ stents were removed intact, inspected and discarded   Drains/Catheters: 1.  Bilateral 6 French, 24 cm Polaris stents without tethers   Intraoperative findings:   1. Right retrograde pyelogram revealed dilation starting in the mid to proximal aspects of the right ureter, extending up to the right renal pelvis.  No distal filling  defects were identified. 2. Left retrograde pyelogram revealed dilation starting in the mid to proximal aspects of the left ureter, extending up to the left renal pelvis.  No distal filling defects were identified   10/14/2019 Imaging   1. Small RIGHT effusion increased from the previous exam with some suggestion of early diaphragmatic nodularity and with soft tissue nodules or small lymph nodes in the cardiophrenic recess, this along with thoracic inlet adenopathy compatible with disease in the chest. 2. Increase in abdominal ascites and subjective increase in fascial thickening in the lower abdomen and pelvis may reflect worsening of disease in this area. Attention on follow-up. 3. Accurate measurements are difficult with respect adnexal structures, increased fullness of the RIGHT adnexa since previous imaging. 4. Retroperitoneal lymph nodes with similar appearance. Stranding about retroperitoneal vessels with some increase. 5. Signs of presumed cystitis with bilateral nephroureteral stents in place not significantly changed from the previous study. 6. Post radiation changes about the rectum. Slightly indistinct appearance of the distal colon along the descending and sigmoid favored to represent increasing serosal involvement in the setting of peritoneal disease. Correlate with any clinical evidence of colitis. 7. Aortic atherosclerosis.   10/28/2019 -  Chemotherapy   The patient had pembrolizumab (KEYTRUDA) 200 mg in sodium chloride 0.9 % 50 mL chemo infusion, 200 mg, Intravenous, Once, 2 of 6 cycles Administration: 200 mg (10/28/2019)  for chemotherapy treatment.      REVIEW OF SYSTEMS:   Constitutional: Denies fevers, chills or abnormal weight loss Eyes: Denies blurriness of vision Ears, nose, mouth, throat, and face: Denies mucositis or sore throat Respiratory: Denies cough, dyspnea or wheezes Cardiovascular: Denies palpitation, chest discomfort Gastrointestinal:  Denies nausea, heartburn  or change in bowel habits Skin: Denies abnormal skin rashes Lymphatics: Denies new lymphadenopathy or easy bruising Neurological:Denies numbness, tingling or new weaknesses Behavioral/Psych: Mood is stable, no new changes  All other systems were reviewed with the patient and are negative.  I have reviewed the past medical history, past surgical history, social history and family history with the patient and they are unchanged from previous note.  ALLERGIES:  has No Known Allergies.  MEDICATIONS:  Current Outpatient Medications  Medication Sig Dispense Refill  . acetaminophen (TYLENOL) 325 MG tablet Take 650 mg by mouth every 6 (six) hours as needed. 08-23-2019 Pt takes one tablet twice daily with one percocet    . methadone (DOLOPHINE) 10 MG tablet Take 1 tablet (10 mg total) by mouth every 12 (twelve) hours. 30 tablet 0  . ondansetron (ZOFRAN) 8 MG tablet Take 1 tablet (8 mg total) by mouth 2 (two) times daily as needed for refractory nausea / vomiting. Start on day 3 after carboplatin chemo. 30 tablet 1  . oxybutynin (DITROPAN) 5 MG tablet Take 1 tablet (5 mg total) by mouth every 8 (eight) hours as needed for  bladder spasms. (Patient taking differently: Take 5 mg by mouth every 8 (eight) hours as needed for bladder spasms. 08-23-2019 Per pt is taking on regular basis one tablet twice daily) 30 tablet 1  . oxyCODONE-acetaminophen (PERCOCET/ROXICET) 5-325 MG tablet Take 2 tablets by mouth every 6 (six) hours as needed for severe pain. 90 tablet 0  . prochlorperazine (COMPAZINE) 10 MG tablet Take 1 tablet (10 mg total) by mouth every 6 (six) hours as needed (Nausea or vomiting). 30 tablet 1  . tamsulosin (FLOMAX) 0.4 MG CAPS capsule Take 0.4 mg by mouth 2 (two) times daily.     No current facility-administered medications for this visit.    PHYSICAL EXAMINATION: ECOG PERFORMANCE STATUS: 1 - Symptomatic but completely ambulatory  Vitals:   11/18/19 1125  BP: 113/75  Pulse: 88  Resp: 18   Temp: (!) 97.3 F (36.3 C)  SpO2: 99%   Filed Weights   11/18/19 1125  Weight: 150 lb 3.2 oz (68.1 kg)    GENERAL:alert, no distress and comfortable SKIN: skin color, texture, turgor are normal, no rashes or significant lesions EYES: normal, Conjunctiva are pink and non-injected, sclera clear OROPHARYNX:no exudate, no erythema and lips, buccal mucosa, and tongue normal  NECK: supple, thyroid normal size, non-tender, without nodularity LYMPH:  no palpable lymphadenopathy in the cervical, axillary or inguinal LUNGS: clear to auscultation and percussion with normal breathing effort HEART: regular rate & rhythm and no murmurs with moderate right lower extremity edema ABDOMEN:abdomen soft, mild suprapubic discomfort Musculoskeletal:no cyanosis of digits and no clubbing  NEURO: alert & oriented x 3 with fluent speech, no focal motor/sensory deficits  LABORATORY DATA:  I have reviewed the data as listed    Component Value Date/Time   NA 137 11/18/2019 1043   K 4.1 11/18/2019 1043   CL 103 11/18/2019 1043   CO2 29 11/18/2019 1043   GLUCOSE 90 11/18/2019 1043   BUN 10 11/18/2019 1043   CREATININE 0.87 11/18/2019 1043   CALCIUM 9.0 11/18/2019 1043   PROT 5.9 (L) 11/18/2019 1043   ALBUMIN 2.9 (L) 11/18/2019 1043   AST 17 11/18/2019 1043   ALT 10 11/18/2019 1043   ALKPHOS 84 11/18/2019 1043   BILITOT 0.3 11/18/2019 1043   GFRNONAA >60 11/18/2019 1043   GFRAA >60 11/11/2019 1109    No results found for: SPEP, UPEP  Lab Results  Component Value Date   WBC 4.7 11/18/2019   NEUTROABS 2.9 11/18/2019   HGB 8.5 (L) 11/18/2019   HCT 26.1 (L) 11/18/2019   MCV 99.6 11/18/2019   PLT 279 11/18/2019      Chemistry      Component Value Date/Time   NA 137 11/18/2019 1043   K 4.1 11/18/2019 1043   CL 103 11/18/2019 1043   CO2 29 11/18/2019 1043   BUN 10 11/18/2019 1043   CREATININE 0.87 11/18/2019 1043      Component Value Date/Time   CALCIUM 9.0 11/18/2019 1043   ALKPHOS 84  11/18/2019 1043   AST 17 11/18/2019 1043   ALT 10 11/18/2019 1043   BILITOT 0.3 11/18/2019 1043

## 2019-11-19 ENCOUNTER — Encounter: Payer: Medicaid Other | Admitting: Rehabilitation

## 2019-11-19 ENCOUNTER — Telehealth: Payer: Self-pay

## 2019-11-19 LAB — URINE CULTURE: Culture: NO GROWTH

## 2019-11-19 LAB — T4: T4, Total: 8.1 ug/dL (ref 4.5–12.0)

## 2019-11-19 NOTE — Telephone Encounter (Signed)
Called and given below message. She verbalized understanding. 

## 2019-11-19 NOTE — Telephone Encounter (Signed)
-----   Message from Heath Lark, MD sent at 11/19/2019  8:44 AM EDT ----- Regarding: UA so far ok, does not look like infection pls call her and let ehr know

## 2019-11-22 ENCOUNTER — Other Ambulatory Visit: Payer: Self-pay

## 2019-11-22 ENCOUNTER — Encounter: Payer: Self-pay | Admitting: Physical Therapy

## 2019-11-22 ENCOUNTER — Ambulatory Visit: Payer: Medicaid Other | Admitting: Physical Therapy

## 2019-11-22 DIAGNOSIS — I890 Lymphedema, not elsewhere classified: Secondary | ICD-10-CM | POA: Diagnosis not present

## 2019-11-22 NOTE — Therapy (Addendum)
[place], [place], [address] Phone: [phone]   Fax:  [phone]  Physical Therapy Treatment  Patient Details  Name: Sheena Torres MRN: [id number] Date of Birth: 04-19-1965 Referring Provider (PT): Alvy Bimler   Encounter Date: 11/22/2019   PT End of Session - 11/22/19 0956    Visit Number 7    Number of Visits 13    Date for PT Re-Evaluation 11/30/19    Authorization Type Medicaid UHC    Authorization - Visit Number 7    Authorization - Number of Visits 27    PT Start Time 0906    PT Stop Time 0956    PT Time Calculation (min) 50 min    Activity Tolerance Patient tolerated treatment well    Behavior During Therapy [facility] for tasks assessed/performed           Past Medical History:  Diagnosis Date  . ADHD   . Anemia   . Bilateral edema of lower extremity    08-23-2019  per pt wears compression hose  . Bilateral ureteral obstruction    urologist--- dr winter  . Cervical cancer [facility]) oncologist-- dr gorsuch/  dr Sondra Come   Stage IIB, SCC--- chemo 05-14-2018 to 06-26-2018 and started chemo 05-28-2019 weekly;  completed IMRT w/ last 5 at high dose 05-14-2018  to 07-27-2018  . Chemotherapy-induced nausea    08-23-2019  per pt intermittant  . Chronic cystitis   . Chronic pain    due to chemo  . Fatigue   . Hematuria    08-23-2019  per pt intermittant  . IBS (irritable bowel syndrome)    w/ diarrhea  . Migraine   . Nocturia more than twice per night   . Pancytopenia, acquired ([place])   . Port-A-Cath in place 05/08/2018  . Sensation of pressure in bladder area    occasional due to radiation therapy  . Urgency of urination    08-23-2019 mostly during the day    Past Surgical History:  Procedure Laterality Date  . BREAST ENHANCEMENT SURGERY Bilateral 2017   w/ implants  . CESAREAN SECTION  2000  . CYSTOSCOPY WITH STENT PLACEMENT Bilateral 05/26/2019   Procedure: CYSTOSCOPY WITH BILATERAL   STENT PLACEMENT;  Surgeon: Ceasar Mons, MD;  Location: [facility];  Service: Urology;  Laterality: Bilateral;  . CYSTOSCOPY WITH STENT PLACEMENT Bilateral 09/01/2019   Procedure: CYSTOSCOPY WITH STENT EXCHANGE/ RETROGRADE;  Surgeon: Ceasar Mons, MD;  Location: [facility];  Service: Urology;  Laterality: Bilateral;  ONLY NEEDS 30 MIN  . IR IMAGING GUIDED PORT INSERTION  05/08/2018  . IR REMOVAL TUN ACCESS W/ PORT W/O FL MOD SED  11/16/2018  . OPERATIVE ULTRASOUND N/A 07/02/2018   Procedure: OPERATIVE ULTRASOUND;  Surgeon: Gery Pray, MD;  Location[facility];  Service: Urology;  Laterality: N/A;  . OPERATIVE ULTRASOUND N/A 07/09/2018   Procedure: OPERATIVE ULTRASOUND;  Surgeon: Gery Pray, MD;  Location[facility];  Service: Urology;  Laterality: N/A;  . OPERATIVE ULTRASOUND N/A 07/13/2018   Procedure: OPERATIVE ULTRASOUND;  Surgeon: Gery Pray, MD;  Location: [facility];  Service: Urology;  Laterality: N/A;  . OPERATIVE ULTRASOUND N/A 07/21/2018   Procedure: OPERATIVE ULTRASOUND;  Surgeon: Gery Pray, MD;  Location: [facility];  Service: Urology;  Laterality: N/A;  . OPERATIVE ULTRASOUND N/A 07/27/2018   Procedure: OPERATIVE ULTRASOUND;  Surgeon: Gery Pray, MD;  Location: [facility];  Service: Urology;  Laterality: N/A;  . TANDEM RING INSERTION N/A 07/02/2018   Procedure: TANDEM RING INSERTION;  Surgeon: Gery Pray, MD;  Location: [facility];  Service: Urology;  Laterality: N/A;  . TANDEM RING INSERTION N/A 07/09/2018   Procedure: TANDEM RING INSERTION;  Surgeon: Gery Pray, MD;  Location: [facility];  Service: Urology;  Laterality: N/A;  . TANDEM RING INSERTION N/A 07/13/2018   Procedure: TANDEM RING INSERTION;  Surgeon: Gery Pray, MD;  Location: [facility];  Service: Urology;  Laterality: N/A;  . TANDEM RING  INSERTION N/A 07/21/2018   Procedure: TANDEM RING INSERTION;  Surgeon: Gery Pray, MD;  Location: [facility];  Service: Urology;  Laterality: N/A;  . TANDEM RING INSERTION N/A 07/27/2018   Procedure: TANDEM RING INSERTION;  Surgeon: Gery Pray, MD;  Location: [facility];  Service: Urology;  Laterality: N/A;    There were no vitals filed for this visit.   Subjective Assessment - 11/22/19 0908    Subjective I had a really good day after therapy on Monday. I tried to do kegels and then I could not urinate at all. I was miserable.    Pertinent History cervical cancer, stage IIB, radiation 05/14/18-06/24/18, 04/16/18 cervical cone biopsy, Sept 2020 was cancer free, Feb 2021- recurrence - tumour on L side of uterus, another tumor between kidney and bladder, pt recently stopped chemo and switched to immunotherapy,    Patient Stated Goals to get the swelling in the right leg down    Currently in Pain? Yes    Pain Score 1     Pain Location Abdomen    Pain Descriptors / Indicators Discomfort    Pain Type Acute pain    Pain Onset In the past 7 days    Pain Frequency Intermittent    Aggravating Factors  unsure    Pain Relieving Factors pain meds    Effect of Pain on Daily Activities unable to do daily activities- had to lie down                       Outpatient Rehab from 10/26/2019 in [place]  Lymphedema Life Impact Scale Total Score 39.71 %            [facility] Adult PT Treatment/Exercise - 11/22/19 0001      Manual Therapy   Manual Therapy Edema management    Edema Management assessed new exostrong for proper fit. Educatd pt and son on proper donning tehcnique and gave pt gloves to help her don. Her son will be able to assist her. Her garment fit and pt was educated to wear it all day and assess abdominal swelling throughout.     Manual Lymphatic Drainage (MLD) In Supine: Short neck, 5 diaphragmatic breaths, Rt  axillary nodes, Rt inguino-axillary anastomosis, Rt LE working from proximal to distal then retracing all steps                       PT Long Term Goals - 10/26/19 1215      PT LONG TERM GOAL #1   Title Pt will demonstrate a 3 cm reduction in edema at 20 cm proximal to floor at lateral plantar foot to decrease risk of cellulitis.    Baseline R 32 cm    Time 5    Period Weeks    Status New    Target Date 11/30/19      PT  LONG TERM GOAL #2   Title Pt will demonstrate a 5 cm decrease in edema at 20 cm proximal to suprapatella to decrease risk of infection.    Baseline R 59 cm    Time 5    Period Weeks    Status New    Target Date 11/30/19      PT LONG TERM GOAL #3   Title Pt will be independent in management of lymphedema through self MLD and compression garments.    Time 5    Period Weeks    Status New    Target Date 11/30/19      PT LONG TERM GOAL #4   Title Pt would benefit from appropriate day and nightitme compression garments for long term management of lymphedema.    Time 5    Period Weeks    Status New    Target Date 11/30/19      PT LONG TERM GOAL #5   Title Pt would benefit from a trial of FlexiTouch compression pump for long term management of lymphedema.    Time 5    Period Weeks    Status New    Target Date 11/30/19                 Plan - 11/22/19 0959    Clinical Impression Statement Continued MLD to RLE. Pt received her compression garments so assessed the medium exostrong for fit on RLE. Educated pt to wear this throughout the rest of the day and assess for increase in abdominal swelling. If pt has no increase in abdominal swelling then she can don the small exostrong on her LLE. Will reassess plan moving forward on Wednesday once we know how her garments worked for her. Pt will benefit from pelvic floor PT once she is discharged from this clinic to address issues with pain and urination. Pt would benefit from a FlexiTouch compression  pump for long term management of her lymphedema. She has completed 4 weeks of treatment but was unable to tolerate bandaging because it caused increased swelling of her LLE and increased abdominal swelling. She has been exercising and has tried elevation but this has not changed her LE swelling. She will not be a candidate for a basic pump due to her current abdominal swelling and need for proximal decongestion.    PT Frequency 3x / week    PT Duration --   5 weeks   PT Treatment/Interventions ADLs/Self Care Home Management;Therapeutic exercise;Therapeutic activities;Patient/family education;Manual techniques;Manual lymph drainage;Compression bandaging;Taping;Passive range of motion;Vasopneumatic Device    PT Next Visit Plan How is bil Le cirucmference since stopping bandaging and wearing old compression stockings along with self MLD? Get new ones ordered? Cont bil LE MLD and cont assessing pts response to treatment    PT Home Exercise Plan Self MLD;    Consulted and Agree with Plan of Care Patient;Family member/caregiver           Patient will benefit from skilled therapeutic intervention in order to improve the following deficits and impairments:  Increased edema, Pain, Difficulty walking  Visit Diagnosis: Lymphedema, not elsewhere classified     Problem List Patient Active Problem List   Diagnosis Date Noted  . Preventive measure 10/28/2019  . UTI (urinary tract infection) 07/30/2019  . Edema of right lower extremity 06/18/2019  . Anemia due to antineoplastic chemotherapy 06/18/2019  . Mucositis due to antineoplastic therapy 06/18/2019  . Financial difficulties 05/21/2019  . Bilateral hydronephrosis 05/18/2019  . Peripheral neuropathy due  to chemotherapy ([place]) 09/25/2018  . Pancytopenia, acquired ([place]) 06/05/2018  . Dysuria 06/04/2018  . Cancer associated pain 05/01/2018  . Other constipation 05/01/2018  . Physical debility 05/01/2018  . Goals of care, counseling/discussion  05/01/2018  . Malignant neoplasm of endocervix ([place]) 04/22/2018  . 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 ADHD (attention deficit hyperactivity disorder), inattentive type 10/11/2015    Allyson Sabal [facility] 11/22/2019, 10:02 AM  [place], [place], [address] Phone: [phone]   Fax:  [phone]  Name: Sheena Torres MRN: [id number] Date of Birth: 19-Jun-1965  Manus Gunning, PT 11/22/19 10:02 AM

## 2019-11-24 ENCOUNTER — Ambulatory Visit: Payer: Medicaid Other | Admitting: Physical Therapy

## 2019-11-25 ENCOUNTER — Encounter (HOSPITAL_BASED_OUTPATIENT_CLINIC_OR_DEPARTMENT_OTHER): Payer: Self-pay | Admitting: Urology

## 2019-11-25 ENCOUNTER — Other Ambulatory Visit: Payer: Self-pay

## 2019-11-25 NOTE — Progress Notes (Signed)
Spoke w/ via phone for pre-op interview---pt Lab needs dos----  none             Lab results------ekg 09-12-2019 epic, chest ct 10-14-2019 COVID test ------ 11-27-2019 1020 Arrive at -------630 am 12-01-2019 NPO after MN NO Solid Food.  Clear liquids from MN until---530 am Medications to take morning of surgery -----percocet prn, tamsulosin, oxybutynin Diabetic medication -----n/a Patient Special Instructions -----none Pre-Op special Istructions -----none Patient verbalized understanding of instructions that were given at this phone interview. Patient denies shortness of breath, chest pain, fever, cough at this phone interview.

## 2019-11-25 NOTE — Progress Notes (Signed)
Spoke w/ via phone for pre-op interview---pt Lab needs dos----  none             Lab results------ekg 09-12-2019 COVID test ------11-27-2019 1020 Arrive at -------630 am 12-01-2019 NPO after MN NO Solid Food.  Clear liquids from MN until---530 am then npo Medications to take morning of surgery -----percocet pr, tamsulosin, flomax Diabetic medication -----n/a Patient Special Instructions -----none Pre-Op special Istructions -----none Patient verbalized understanding of instructions that were given at this phone interview. Patient denies shortness of breath, chest pain, fever, cough at this phone interview.

## 2019-11-26 ENCOUNTER — Ambulatory Visit: Payer: Medicaid Other | Admitting: Physical Therapy

## 2019-11-27 ENCOUNTER — Other Ambulatory Visit (HOSPITAL_COMMUNITY): Payer: Medicaid Other

## 2019-11-27 ENCOUNTER — Other Ambulatory Visit (HOSPITAL_COMMUNITY)
Admission: RE | Admit: 2019-11-27 | Discharge: 2019-11-27 | Disposition: A | Discharge status: Home / Self Care | Payer: Medicaid Other | Source: Ambulatory Visit | Attending: Urology | Admitting: Urology

## 2019-11-27 DIAGNOSIS — Z20822 Contact with and (suspected) exposure to covid-19: Secondary | ICD-10-CM | POA: Insufficient documentation

## 2019-11-27 DIAGNOSIS — Z01812 Encounter for preprocedural laboratory examination: Secondary | ICD-10-CM | POA: Insufficient documentation

## 2019-11-27 LAB — SARS CORONAVIRUS 2 (TAT 6-24 HRS): SARS Coronavirus 2: NEGATIVE

## 2019-11-29 ENCOUNTER — Inpatient Hospital Stay: Payer: Medicaid Other

## 2019-11-29 ENCOUNTER — Other Ambulatory Visit (HOSPITAL_COMMUNITY): Payer: Self-pay | Admitting: Hematology and Oncology

## 2019-11-29 ENCOUNTER — Other Ambulatory Visit: Payer: Self-pay

## 2019-11-29 ENCOUNTER — Telehealth: Payer: Self-pay | Admitting: Oncology

## 2019-11-29 ENCOUNTER — Other Ambulatory Visit: Payer: Self-pay | Admitting: Hematology and Oncology

## 2019-11-29 DIAGNOSIS — D6481 Anemia due to antineoplastic chemotherapy: Secondary | ICD-10-CM

## 2019-11-29 DIAGNOSIS — C530 Malignant neoplasm of endocervix: Secondary | ICD-10-CM

## 2019-11-29 DIAGNOSIS — G893 Neoplasm related pain (acute) (chronic): Secondary | ICD-10-CM

## 2019-11-29 DIAGNOSIS — T451X5A Adverse effect of antineoplastic and immunosuppressive drugs, initial encounter: Secondary | ICD-10-CM

## 2019-11-29 DIAGNOSIS — Z7189 Other specified counseling: Secondary | ICD-10-CM

## 2019-11-29 DIAGNOSIS — Z5112 Encounter for antineoplastic immunotherapy: Secondary | ICD-10-CM | POA: Diagnosis not present

## 2019-11-29 LAB — CBC WITH DIFFERENTIAL (CANCER CENTER ONLY)
Abs Immature Granulocytes: 0.03 10*3/uL (ref 0.00–0.07)
Basophils Absolute: 0 10*3/uL (ref 0.0–0.1)
Basophils Relative: 0 %
Eosinophils Absolute: 0.2 10*3/uL (ref 0.0–0.5)
Eosinophils Relative: 4 %
HCT: 29.6 % — ABNORMAL LOW (ref 36.0–46.0)
Hemoglobin: 9.5 g/dL — ABNORMAL LOW (ref 12.0–15.0)
Immature Granulocytes: 1 %
Lymphocytes Relative: 29 %
Lymphs Abs: 1.4 10*3/uL (ref 0.7–4.0)
MCH: 32.6 pg (ref 26.0–34.0)
MCHC: 32.1 g/dL (ref 30.0–36.0)
MCV: 101.7 fL — ABNORMAL HIGH (ref 80.0–100.0)
Monocytes Absolute: 0.5 10*3/uL (ref 0.1–1.0)
Monocytes Relative: 11 %
Neutro Abs: 2.6 10*3/uL (ref 1.7–7.7)
Neutrophils Relative %: 55 %
Platelet Count: 300 10*3/uL (ref 150–400)
RBC: 2.91 MIL/uL — ABNORMAL LOW (ref 3.87–5.11)
RDW: 15.8 % — ABNORMAL HIGH (ref 11.5–15.5)
WBC Count: 4.6 10*3/uL (ref 4.0–10.5)
nRBC: 0 % (ref 0.0–0.2)

## 2019-11-29 LAB — CMP (CANCER CENTER ONLY)
ALT: 8 U/L (ref 0–44)
AST: 16 U/L (ref 15–41)
Albumin: 2.9 g/dL — ABNORMAL LOW (ref 3.5–5.0)
Alkaline Phosphatase: 84 U/L (ref 38–126)
Anion gap: 7 (ref 5–15)
BUN: 8 mg/dL (ref 6–20)
CO2: 29 mmol/L (ref 22–32)
Calcium: 9.4 mg/dL (ref 8.9–10.3)
Chloride: 101 mmol/L (ref 98–111)
Creatinine: 0.85 mg/dL (ref 0.44–1.00)
GFR, Estimated: >60 mL/min (ref >60)
Glucose, Bld: 92 mg/dL (ref 70–99)
Potassium: 3.4 mmol/L — ABNORMAL LOW (ref 3.5–5.1)
Sodium: 137 mmol/L (ref 135–145)
Total Bilirubin: <0.2 mg/dL — ABNORMAL LOW (ref 0.3–1.2)
Total Protein: 6.2 g/dL — ABNORMAL LOW (ref 6.5–8.1)

## 2019-11-29 LAB — SAMPLE TO BLOOD BANK

## 2019-11-29 LAB — TSH: TSH: 1.183 u[IU]/mL (ref 0.308–3.960)

## 2019-11-29 MED ORDER — OXYCODONE-ACETAMINOPHEN 5-325 MG PO TABS
2.0000 | ORAL_TABLET | Freq: Four times a day (QID) | ORAL | 0 refills | Status: DC | PRN
Start: 2019-11-29 — End: 2019-12-09

## 2019-11-29 MED FILL — OXYCODONE-APAP 5-325MG: 5-325 | 11 days supply | Qty: 90 | Fill #0

## 2019-11-29 NOTE — Telephone Encounter (Signed)
done

## 2019-11-29 NOTE — Telephone Encounter (Signed)
Called Genetta and let her know the refill has been sent in.

## 2019-11-29 NOTE — Telephone Encounter (Signed)
Sheena Torres called and requested a refill of Percocet to be sent to [facility].

## 2019-11-29 NOTE — Progress Notes (Signed)
Per Dr. Alvy Bimler, patient does not need blood transfusion today. Patient aware.

## 2019-11-30 LAB — T4: T4, Total: 7.3 ug/dL (ref 4.5–12.0)

## 2019-12-01 ENCOUNTER — Encounter (HOSPITAL_BASED_OUTPATIENT_CLINIC_OR_DEPARTMENT_OTHER)
Admission: RE | Disposition: A | Discharge status: Home / Self Care | Payer: Self-pay | Source: Home / Self Care | Attending: Urology

## 2019-12-01 ENCOUNTER — Other Ambulatory Visit (HOSPITAL_COMMUNITY): Payer: Self-pay | Admitting: Urology

## 2019-12-01 ENCOUNTER — Ambulatory Visit (HOSPITAL_BASED_OUTPATIENT_CLINIC_OR_DEPARTMENT_OTHER): Payer: Medicaid Other | Admitting: Anesthesiology

## 2019-12-01 ENCOUNTER — Other Ambulatory Visit: Payer: Self-pay

## 2019-12-01 ENCOUNTER — Ambulatory Visit (HOSPITAL_BASED_OUTPATIENT_CLINIC_OR_DEPARTMENT_OTHER)
Admission: RE | Admit: 2019-12-01 | Discharge: 2019-12-01 | Disposition: A | Discharge status: Home / Self Care | Payer: Medicaid Other | Attending: Urology | Admitting: Urology

## 2019-12-01 ENCOUNTER — Encounter (HOSPITAL_BASED_OUTPATIENT_CLINIC_OR_DEPARTMENT_OTHER): Payer: Self-pay | Admitting: Urology

## 2019-12-01 DIAGNOSIS — N1330 Unspecified hydronephrosis: Secondary | ICD-10-CM | POA: Insufficient documentation

## 2019-12-01 DIAGNOSIS — C539 Malignant neoplasm of cervix uteri, unspecified: Secondary | ICD-10-CM | POA: Diagnosis not present

## 2019-12-01 DIAGNOSIS — Z466 Encounter for fitting and adjustment of urinary device: Secondary | ICD-10-CM | POA: Insufficient documentation

## 2019-12-01 DIAGNOSIS — Z79899 Other long term (current) drug therapy: Secondary | ICD-10-CM | POA: Diagnosis not present

## 2019-12-01 DIAGNOSIS — R3915 Urgency of urination: Secondary | ICD-10-CM | POA: Insufficient documentation

## 2019-12-01 DIAGNOSIS — K589 Irritable bowel syndrome without diarrhea: Secondary | ICD-10-CM | POA: Diagnosis not present

## 2019-12-01 HISTORY — DX: Other injury of unspecified body region, initial encounter: T14.8XXA

## 2019-12-01 HISTORY — DX: Unspecified urinary incontinence: R32

## 2019-12-01 HISTORY — DX: Other complications of anesthesia, initial encounter: T88.59XA

## 2019-12-01 HISTORY — PX: CYSTOSCOPY W/ URETERAL STENT PLACEMENT: SHX1429

## 2019-12-01 SURGERY — CYSTOSCOPY, WITH RETROGRADE PYELOGRAM AND URETERAL STENT INSERTION
Anesthesia: General | Laterality: Bilateral

## 2019-12-01 MED ORDER — OXYCODONE HCL 5 MG PO TABS
ORAL_TABLET | ORAL | Status: AC
  Filled 2019-12-01: qty 1

## 2019-12-01 MED ORDER — OXYBUTYNIN CHLORIDE 5 MG PO TABS
5.0000 mg | ORAL_TABLET | Freq: Once | ORAL | Status: AC
  Administered 2019-12-01: 5 mg via ORAL

## 2019-12-01 MED ORDER — BELLADONNA ALKALOIDS-OPIUM 16.2-60 MG RE SUPP
RECTAL | Status: AC
  Filled 2019-12-01: qty 1

## 2019-12-01 MED ORDER — CEPHALEXIN 500 MG PO CAPS: 500.0000 mg | ORAL_CAPSULE | Freq: Two times a day (BID) | ORAL | 0 refills | Status: AC

## 2019-12-01 MED ORDER — FENTANYL CITRATE (PF) 100 MCG/2ML IJ SOLN
INTRAMUSCULAR | Status: DC | PRN
  Administered 2019-12-01: 50 ug via INTRAVENOUS
  Administered 2019-12-01 (×2): 25 ug via INTRAVENOUS

## 2019-12-01 MED ORDER — PROPOFOL 10 MG/ML IV BOLUS
INTRAVENOUS | Status: DC | PRN
  Administered 2019-12-01: 130 mg via INTRAVENOUS

## 2019-12-01 MED ORDER — DEXAMETHASONE SODIUM PHOSPHATE 10 MG/ML IJ SOLN
INTRAMUSCULAR | Status: DC | PRN
  Administered 2019-12-01: 5 mg via INTRAVENOUS

## 2019-12-01 MED ORDER — OXYCODONE HCL 5 MG/5ML PO SOLN: 5.0000 mg | Freq: Once | ORAL | Status: AC | PRN

## 2019-12-01 MED ORDER — MIDAZOLAM HCL 5 MG/5ML IJ SOLN
INTRAMUSCULAR | Status: DC | PRN
  Administered 2019-12-01: 2 mg via INTRAVENOUS

## 2019-12-01 MED ORDER — PROPOFOL 10 MG/ML IV BOLUS
INTRAVENOUS | Status: AC
  Filled 2019-12-01: qty 40

## 2019-12-01 MED ORDER — ACETAMINOPHEN 325 MG PO TABS
ORAL_TABLET | ORAL | Status: AC
  Filled 2019-12-01: qty 2

## 2019-12-01 MED ORDER — DEXAMETHASONE SODIUM PHOSPHATE 10 MG/ML IJ SOLN
INTRAMUSCULAR | Status: AC
  Filled 2019-12-01: qty 1

## 2019-12-01 MED ORDER — BELLADONNA ALKALOIDS-OPIUM 16.2-60 MG RE SUPP
RECTAL | Status: DC | PRN
  Administered 2019-12-01: 1 via RECTAL

## 2019-12-01 MED ORDER — MIDAZOLAM HCL 2 MG/2ML IJ SOLN
INTRAMUSCULAR | Status: AC
  Filled 2019-12-01: qty 2

## 2019-12-01 MED ORDER — IOHEXOL 300 MG/ML  SOLN
INTRAMUSCULAR | Status: DC | PRN
  Administered 2019-12-01: 20 mL via URETHRAL

## 2019-12-01 MED ORDER — ONDANSETRON HCL 4 MG/2ML IJ SOLN
INTRAMUSCULAR | Status: DC | PRN
  Administered 2019-12-01: 4 mg via INTRAVENOUS

## 2019-12-01 MED ORDER — CEFAZOLIN SODIUM-DEXTROSE 2-4 GM/100ML-% IV SOLN
INTRAVENOUS | Status: AC
  Filled 2019-12-01: qty 100

## 2019-12-01 MED ORDER — LIDOCAINE 2% (20 MG/ML) 5 ML SYRINGE
INTRAMUSCULAR | Status: DC | PRN
  Administered 2019-12-01: 60 mg via INTRAVENOUS

## 2019-12-01 MED ORDER — ONDANSETRON HCL 4 MG/2ML IJ SOLN
INTRAMUSCULAR | Status: AC
  Filled 2019-12-01: qty 2

## 2019-12-01 MED ORDER — OXYBUTYNIN CHLORIDE 5 MG PO TABS
ORAL_TABLET | ORAL | Status: AC
  Filled 2019-12-01: qty 1

## 2019-12-01 MED ORDER — OXYCODONE HCL 5 MG PO TABS
5.0000 mg | ORAL_TABLET | Freq: Once | ORAL | Status: AC | PRN
  Administered 2019-12-01: 5 mg via ORAL

## 2019-12-01 MED ORDER — CEFAZOLIN SODIUM-DEXTROSE 2-4 GM/100ML-% IV SOLN
2.0000 g | Freq: Once | INTRAVENOUS | Status: AC
  Administered 2019-12-01: 2 g via INTRAVENOUS

## 2019-12-01 MED ORDER — ACETAMINOPHEN 10 MG/ML IV SOLN: 1000.0000 mg | Freq: Once | INTRAVENOUS | Status: DC | PRN

## 2019-12-01 MED ORDER — FENTANYL CITRATE (PF) 100 MCG/2ML IJ SOLN
INTRAMUSCULAR | Status: AC
  Filled 2019-12-01: qty 2

## 2019-12-01 MED ORDER — HYDROMORPHONE HCL 1 MG/ML IJ SOLN: 0.2500 mg | INTRAMUSCULAR | Status: DC | PRN

## 2019-12-01 MED ORDER — LACTATED RINGERS IV SOLN: INTRAVENOUS | Status: DC

## 2019-12-01 MED ORDER — LIDOCAINE 2% (20 MG/ML) 5 ML SYRINGE
INTRAMUSCULAR | Status: AC
  Filled 2019-12-01: qty 5

## 2019-12-01 MED ORDER — ACETAMINOPHEN 325 MG PO TABS
650.0000 mg | ORAL_TABLET | Freq: Once | ORAL | Status: AC
  Administered 2019-12-01: 650 mg via ORAL

## 2019-12-01 MED ORDER — SODIUM CHLORIDE 0.9 % IR SOLN
Status: DC | PRN
  Administered 2019-12-01: 3000 mL

## 2019-12-01 MED ORDER — PHENAZOPYRIDINE HCL 100 MG PO TABS
200.0000 mg | ORAL_TABLET | Freq: Three times a day (TID) | ORAL | Status: AC
  Administered 2019-12-01: 200 mg via ORAL

## 2019-12-01 MED ORDER — ONDANSETRON HCL 4 MG/2ML IJ SOLN: 4.0000 mg | Freq: Once | INTRAMUSCULAR | Status: DC | PRN

## 2019-12-01 MED FILL — CEPHALEXIN 500 MG CAPSULE: 500 | 3 days supply | Qty: 6 | Fill #0

## 2019-12-01 SURGICAL SUPPLY — 28 items
APL SKNCLS STERI-STRIP NONHPOA (GAUZE/BANDAGES/DRESSINGS)
BAG DRAIN URO-CYSTO SKYTR STRL (DRAIN) ×3 IMPLANT
BAG DRN UROCATH (DRAIN) ×1
BASKET STONE 1.7 NGAGE (UROLOGICAL SUPPLIES) IMPLANT
BASKET ZERO TIP NITINOL 2.4FR (BASKET) ×3 IMPLANT
BENZOIN TINCTURE PRP APPL 2/3 (GAUZE/BANDAGES/DRESSINGS) IMPLANT
BSKT STON RTRVL ZERO TP 2.4FR (BASKET) ×1
CATH URET 5FR 28IN OPEN ENDED (CATHETERS) IMPLANT
CLOSURE WOUND 1/2 X4 (GAUZE/BANDAGES/DRESSINGS)
CLOTH BEACON ORANGE TIMEOUT ST (SAFETY) ×3 IMPLANT
FIBER LASER FLEXIVA 365 (UROLOGICAL SUPPLIES) IMPLANT
FIBER LASER TRAC TIP (UROLOGICAL SUPPLIES) IMPLANT
GLOVE BIO SURGEON STRL SZ7.5 (GLOVE) ×3 IMPLANT
GOWN STRL REUS W/TWL XL LVL3 (GOWN DISPOSABLE) ×3 IMPLANT
GUIDEWIRE STR DUAL SENSOR (WIRE) IMPLANT
GUIDEWIRE ZIPWRE .038 STRAIGHT (WIRE) ×3 IMPLANT
IV NS IRRIG 3000ML ARTHROMATIC (IV SOLUTION) ×6 IMPLANT
KIT TURNOVER CYSTO (KITS) ×3 IMPLANT
MANIFOLD NEPTUNE II (INSTRUMENTS) ×3 IMPLANT
NS IRRIG 500ML POUR BTL (IV SOLUTION) ×3 IMPLANT
PACK CYSTO (CUSTOM PROCEDURE TRAY) ×3 IMPLANT
STENT POLARIS 5FRX22 (STENTS) ×3 IMPLANT
STENT POLARIS LOOP 6FR X 22 CM (STENTS) ×3 IMPLANT
STRIP CLOSURE SKIN 1/2X4 (GAUZE/BANDAGES/DRESSINGS) IMPLANT
SYR 10ML LL (SYRINGE) ×3 IMPLANT
TUBE CONNECTING 12'X1/4 (SUCTIONS)
TUBE CONNECTING 12X1/4 (SUCTIONS) IMPLANT
TUBING UROLOGY SET (TUBING) ×3 IMPLANT

## 2019-12-01 NOTE — Transfer of Care (Signed)
Immediate Anesthesia Transfer of Care Note  Patient: Sheena Torres  Procedure(s) Performed: CYSTOSCOPY WITH RETROGRADE PYELOGRAM/URETERAL STENT PLACEMENT (Bilateral )  Patient Location: PACU  Anesthesia Type:General  Level of Consciousness: awake, drowsy and responds to stimulation  Airway & Oxygen Therapy: Patient Spontanous Breathing and Patient connected to nasal cannula oxygen  Post-op Assessment: Report given to RN and Post -op Vital signs reviewed and stable  Post vital signs: stable  Last Vitals:  Vitals Value Taken Time  BP 126/89 12/01/19 0909  Temp    Pulse    Resp 10 12/01/19 0910  SpO2    Vitals shown include unvalidated device data.  Last Pain:  Vitals:   12/01/19 0648  TempSrc: Oral  PainSc: 9       Patients Stated Pain Goal: 4 (11/10/55 4734)  Complications: No complications documented.

## 2019-12-01 NOTE — H&P (Signed)
PRE-OP H&P  Office Visit Report     11/01/2019   --------------------------------------------------------------------------------   Sheena Torres  MRN: [id number]  DOB: 08-31-1965, 54 year old Female  SSN:    PRIMARY CARE:  Terald Sleeper, PA  REFERRING:  Blair Promise, MD  PROVIDER:  Raynelle Bring, M.D.  TREATING:  Ellison Hughs, M.D.  LOCATION:  [facility] [phone]     --------------------------------------------------------------------------------   CC/HPI: CC: Hydronephrosis   HPI: Sheena Torres is a 54 year old female with stage IV cervical cancer with concomitant bilateral hydronephrosis requiring chronic indwelling ureteral stents.   Last Stent exchange: 09/01/19  Last serum creatinine- 0.9 (10/2019)   The patient is here today for a routine follow-up to plan her next stent exchange. Currently off chemotherapy and was started on Keytruda, which she is tolerating well. She notes only mild flank discomfort from her ureteral stents. Denies interval UTIs, dysuria or hematuria. Notes that she has difficulty voiding during the day, but voids 4-5x per night. She reports bilateral LE edema that progressively worsens during the day. She continues oxybutynin 5 mg and tamsulosin w/o any associated side effects.     ALLERGIES: None   MEDICATIONS: Oxybutynin Chloride 5 mg tablet 1 tablet PO TID PRN  Oxybutynin Chloride 5 mg tablet 1 tablet PO TID PRN  Oxybutynin Chloride 5 mg tablet 1 tablet PO TID PRN  Percocet 5 mg-325 mg tablet  Phenazopyridine Hcl  Tamsulosin Hcl 0.4 mg capsule 1 capsule PO BID  Align  Imitrex  Oxybutynin Chloride Er  Vitamin B12     GU PSH: Cystoscopy Insert Stent, Bilateral - 09/01/2019, Bilateral - 05/26/2019       PSH Notes: Port for [employer]   NON-GU PSH: Cesarean Delivery     GU PMH: Urinary Frequency - 06/02/2019 Urinary Urgency - 06/02/2019 Hydronephrosis - 05/20/2019      PMH Notes: Cervical Cancer     NON-GU PMH: None    FAMILY HISTORY: Diabetes - Father Hematuria - Father Kidney Failure - Father   SOCIAL HISTORY: Marital Status: Divorced Preferred Language: English; Ethnicity: Not Hispanic Or Latino; Race: White Current Smoking Status: Patient has never smoked.   Tobacco Use Assessment Completed: Used Tobacco in last 30 days? Has never drank.  Drinks 2 caffeinated drinks per day.    REVIEW OF SYSTEMS:    GU Review Female:   Patient reports get up at night to urinate and leakage of urine. Patient denies frequent urination, hard to postpone urination, burning /pain with urination, stream starts and stops, trouble starting your stream, have to strain to urinate, and being pregnant.  Gastrointestinal (Upper):   Patient denies indigestion/ heartburn, nausea, and vomiting.  Gastrointestinal (Lower):   Patient denies diarrhea and constipation.  Constitutional:   Patient denies fever, night sweats, weight loss, and fatigue.  Skin:   Patient denies skin rash/ lesion and itching.  Eyes:   Patient denies blurred vision and double vision.  Ears/ Nose/ Throat:   Patient denies sore throat and sinus problems.  Hematologic/Lymphatic:   Patient denies swollen glands and easy bruising.  Cardiovascular:   Patient reports leg swelling. Patient denies chest pains.  Respiratory:   Patient denies cough and shortness of breath.  Endocrine:   Patient denies excessive thirst.  Musculoskeletal:   Patient reports back pain. Patient denies joint pain.  Neurological:   Patient denies headaches and dizziness.  Psychologic:   Patient denies depression and anxiety.   Notes: Pt c/o abdomen swelling and not  urinating much during the day. Pt has Nocturia 4x per night.    VITAL SIGNS:      11/01/2019 08:18 AM  Weight 143 lb / 64.86 kg  Height 62 in / 157.48 cm  BP 103/70 mmHg  Heart Rate 112 /min  Temperature 97.1 F / 36.1 C  BMI 26.2 kg/m   MULTI-SYSTEM PHYSICAL EXAMINATION:    Constitutional: Well-nourished. No  physical deformities. Normally developed. Good grooming.  Neurologic / Psychiatric: Oriented to time, oriented to place, oriented to person. No depression, no anxiety, no agitation.     Complexity of Data:  Lab Test Review:   BMP  Records Review:   Previous Patient Records  X-Ray Review: C.T. Chest/ Abd/Pelvis: Reviewed Films. Reviewed Report. Discussed With Patient.    Notes:                     CLINICAL DATA: Metastatic cervical cancer assess treatment  response.     EXAM:  CT CHEST, ABDOMEN, AND PELVIS WITH CONTRAST     TECHNIQUE:  Multidetector CT imaging of the chest, abdomen and pelvis was  performed following the standard protocol during bolus  administration of intravenous contrast.     CONTRAST: 126mL OMNIPAQUE IOHEXOL 300 MG/ML SOLN     COMPARISON: July 29, 2019     FINDINGS:  CT CHEST FINDINGS     Cardiovascular: Is normal caliber thoracic aorta. Normal heart size.  No pericardial effusion. There is some nodularity in the RIGHT  cardiophrenic recess, largest approximately 7 mm in the pre  pericardial fat likely small lymph nodes without significant change  from previous imaging.     Central pulmonary vasculature unremarkable on venous phase  assessment.     Mediastinum/Nodes: No axillary lymphadenopathy. No mediastinal  lymphadenopathy with scattered small nodes throughout the  mediastinum, none with pathologic enlargement. No hilar  lymphadenopathy.     Mild enlargement of high RIGHT paratracheal lymph node (image 10,  series 2) 6 mm.     Group of lymph nodes just at the RIGHT thoracic inlet (image 6,  series 2) largest 11 mm.     Group of lymph nodes at the LEFT thoracic inlet also with mild  enlargement, largest approximately 7-8 mm.     Lungs/Pleura: Small RIGHT pleural effusion perhaps trace effusion on  previous imaging. Very subtle nodularity along the surface of the  RIGHT hemidiaphragm tracking towards the cardiophrenic recess best  seen on  sagittal image 53 of series 6.     Granuloma with calcification in the RIGHT upper lobe.     Calcified granuloma also in the RIGHT middle lobe. Airways are  patent. No consolidation.     Musculoskeletal: No acute bone finding related to the bony thorax.  No destructive bone lesion. Bilateral breast implants in place.     CT ABDOMEN PELVIS FINDINGS     Hepatobiliary: No focal, suspicious hepatic lesion. Studding of the  anterior surface of the LEFT hemi liver is similar to the prior  exam. No focal parenchymal hepatic lesion. The gallbladder is  collapsed. No biliary duct distension.     Pancreas: Pancreas is normal without ductal dilation or signs of  inflammation.     Spleen: Spleen normal in size and contour.     Adrenals/Urinary Tract: Adrenal glands are normal.     LEFT-sided and RIGHT-sided nephroureteral stents remain in place.  LEFT renal atrophy. No hydronephrosis. Soft tissue along the course  of the distal ureters with similar appearance.  Persistent marked bladder wall thickening. Stents, distal aspects in  stable position.     Stomach/Bowel: No acute gastrointestinal process. Serosal disease  about bowel loops in the pelvis and nodularity and small volume  ascites along the RIGHT pericolic gutter with subjective increase.  There is definitive fluid with nodularity along the RIGHT pericolic  gutter along the inferior tip of the RIGHT hepatic lobe best seen on  image 73 of series 2. Mesenteric thickening with increases is  subjective finding.     Indistinct margins of the descending colon raising the question of  serosal disease in this area as well.     Gastric distension filled with ingested contrast material.     Vascular/Lymphatic: No aneurysmal dilation of the abdominal aorta.  Stranding about retroperitoneal vessels with some increase. LEFT  retroperitoneal lymph nodes with similar appearance, largest  approximately 11 mm as compared to 13 mm on the  previous study along  the LEFT periaortic chain. Intra-aortocaval lymph nodes while  smaller numerous and some rounded, unchanged from the previous  study.     No discrete pelvic adenopathy. Extensive pelvic fascial thickening.     Reproductive: Endometrial canal slightly more distended than on the  previous study. Ill-defined soft tissue and fluid about the  bilateral adnexa with similar appearance. A measurable site of  disease is not present. RIGHT adnexa measuring 3.4 x 2.8 cm, when  measured similarly 2.6 x 2.6 cm. Area of the LEFT adnexa is grossly  similar to the prior study. Indistinct margins do not allow for  accurate measurement.     Thickening of the mesorectum and stranding about the rectum with  similar appearance.     Other: Slight increase in ascites best seen along the RIGHT flank  beneath the RIGHT hemi liver.     Musculoskeletal: No acute musculoskeletal process. No destructive  bone finding.     IMPRESSION:  1. Small RIGHT effusion increased from the previous exam with some  suggestion of early diaphragmatic nodularity and with soft tissue  nodules or small lymph nodes in the cardiophrenic recess, this along  with thoracic inlet adenopathy compatible with disease in the chest.  2. Increase in abdominal ascites and subjective increase in fascial  thickening in the lower abdomen and pelvis may reflect worsening of  disease in this area. Attention on follow-up.  3. Accurate measurements are difficult with respect adnexal  structures, increased fullness of the RIGHT adnexa since previous  imaging.  4. Retroperitoneal lymph nodes with similar appearance. Stranding  about retroperitoneal vessels with some increase.  5. Signs of presumed cystitis with bilateral nephroureteral stents  in place not significantly changed from the previous study.  6. Post radiation changes about the rectum. Slightly indistinct  appearance of the distal colon along the descending and  sigmoid  favored to represent increasing serosal involvement in the setting  of peritoneal disease. Correlate with any clinical evidence of  colitis.  7. Aortic atherosclerosis.     These results will be called to the ordering clinician or  representative by the Radiologist Assistant, and communication  documented in the PACS or [employer].     Aortic Atherosclerosis (ICD10-I70.0).        Electronically Signed  By: Zetta Bills M.D.  On: 10/14/2019 17:41   PROCEDURES:          Urinalysis w/Scope Dipstick Dipstick Cont'd Micro  Color: Yellow Bilirubin: Neg mg/dL WBC/hpf: 0 - 5/hpf  Appearance: Clear Ketones: Neg mg/dL RBC/hpf: 0 - 2/hpf  Specific Gravity: 1.010 Blood: 2+ ery/uL Bacteria: NS (Not Seen)  pH: 6.0 Protein: Neg mg/dL Cystals: NS (Not Seen)  Glucose: Neg mg/dL Urobilinogen: 0.2 mg/dL Casts: NS (Not Seen)    Nitrites: Neg Trichomonas: Not Present    Leukocyte Esterase: Trace leu/uL Mucous: Not Present      Epithelial Cells: NS (Not Seen)      Yeast: NS (Not Seen)      Sperm: Not Present    ASSESSMENT:      ICD-10 Details  1 GU:   Hydronephrosis - N13.0 Bilateral, Chronic, Stable   PLAN:           Orders Labs Urine Culture          Schedule Return Visit/Planned Activity: Next Available Appointment - Schedule Surgery          Document Letter(s):  Created for Patient: Clinical Summary   Created for Blair Promise, MD         Notes:   -She had bilateral Polaris stents placed at her last exchange, which she would like to continue. The risks, benefits and alteranatives of bilateral stent exchange was discussed with the patient. Risks include, but are not limited to: bleeding, urinary tract infection, ureteral injury, ureteral stricture disease, chronic pain, urinary symptoms, bladder injury, stent migration, the need for nephrostomy tube placement, MI, CVA, DVT, PE and the inherent risks with general anesthesia. The patient voices understanding and  wishes to proceed.

## 2019-12-01 NOTE — Discharge Instructions (Signed)
CYSTOSCOPY HOME CARE INSTRUCTIONS  Activity: Rest for the remainder of the day.  Do not drive or operate equipment today.  You may resume normal activities in one to two days as instructed by your physician.   Meals: Drink plenty of liquids and eat light foods such as gelatin or soup this evening.  You may return to a normal meal plan tomorrow.  Return to Work: You may return to work in one to two days or as instructed by your physician.  Special Instructions / Symptoms: Call your physician if any of these symptoms occur:   -persistent or heavy bleeding  -bleeding which continues after first few urination  -large blood clots that are difficult to pass  -urine stream diminishes or stops completely  -fever equal to or higher than 101 degrees Farenheit.  -cloudy urine with a strong, foul odor  -severe pain  Females should always wipe from front to back after elimination.  You may feel some burning pain when you urinate.  This should disappear with time.  Applying moist heat to the lower abdomen or a hot tub bath may help relieve the pain    [facility] [phone] Post Ureteroscopy With or Without Stent Instructions  Definitions:  Ureter: The duct that transports urine from the kidney to the bladder. Stent:   A plastic hollow tube that is placed into the ureter, from the kidney to the bladder to prevent the ureter from swelling shut.  GENERAL INSTRUCTIONS:  Despite the fact that no skin incisions were used, the area around the ureter and bladder is raw and irritated. The stent is a foreign body which will further irritate the bladder wall. This irritation is manifested by increased frequency of urination, both day and night, and by an increase in the urge to urinate. In some, the urge to urinate is present almost always. Sometimes the urge is strong enough that you may not be able to stop yourself from urinating. The only real cure is to remove the stent and then  give time for the bladder wall to heal which can't be done until the danger of the ureter swelling shut has passed, which varies.  You may see some blood in your urine while the stent is in place and a few days afterwards. Do not be alarmed, even if the urine was clear for a while. Get off your feet and drink lots of fluids until clearing occurs. If you start to pass clots or don't improve, call us.  DIET: You may return to your normal diet immediately. Because of the raw surface of your bladder, alcohol, spicy foods, acid type foods and drinks with caffeine may cause irritation or frequency and should be used in moderation. To keep your urine flowing freely and to avoid constipation, drink plenty of fluids during the day ( 8-10 glasses ). Tip: Avoid cranberry juice because it is very acidic.  ACTIVITY: Your physical activity doesn't need to be restricted. However, if you are very active, you may see some blood in your urine. We suggest that you reduce your activity under these circumstances until the bleeding has stopped.  BOWELS: It is important to keep your bowels regular during the postoperative period. Straining with bowel movements can cause bleeding. A bowel movement every other day is reasonable. Use a mild laxative if needed, such as Milk of Magnesia 2-3 tablespoons, or 2 Dulcolax tablets. Call if you continue to have problems. If you have been taking narcotics for pain, before, during or  after your surgery, you may be constipated. Take a laxative if necessary.   MEDICATION: You should resume your pre-surgery medications unless told not to. In addition you will often be given an antibiotic to prevent infection. These should be taken as prescribed until the bottles are finished unless you are having an unusual reaction to one of the drugs.  PROBLEMS YOU SHOULD REPORT TO [place]:  Fevers over 100.5 Fahrenheit.  Heavy bleeding, or clots ( See above notes about blood in urine ).  Inability to  urinate.  Drug reactions ( hives, rash, nausea, vomiting, diarrhea ).  Severe burning or pain with urination that is not improving.  FOLLOW-UP: You will need a follow-up appointment to monitor your progress. Call for this appointment at the number listed above. Usually the first appointment will be about three to fourteen days after your surgery.     [facility] [phone] Post Ureteroscopy With or Without Stent Instructions  Definitions:  Ureter: The duct that transports urine from the kidney to the bladder. Stent:   A plastic hollow tube that is placed into the ureter, from the kidney to the bladder to prevent the ureter from swelling shut.  GENERAL INSTRUCTIONS:  Despite the fact that no skin incisions were used, the area around the ureter and bladder is raw and irritated. The stent is a foreign body which will further irritate the bladder wall. This irritation is manifested by increased frequency of urination, both day and night, and by an increase in the urge to urinate. In some, the urge to urinate is present almost always. Sometimes the urge is strong enough that you may not be able to stop yourself from urinating. The only real cure is to remove the stent and then give time for the bladder wall to heal which can't be done until the danger of the ureter swelling shut has passed, which varies.  You may see some blood in your urine while the stent is in place and a few days afterwards. Do not be alarmed, even if the urine was clear for a while. Get off your feet and drink lots of fluids until clearing occurs. If you start to pass clots or don't improve, call us.  DIET: You may return to your normal diet immediately. Because of the raw surface of your bladder, alcohol, spicy foods, acid type foods and drinks with caffeine may cause irritation or frequency and should be used in moderation. To keep your urine flowing freely and to avoid constipation, drink plenty of  fluids during the day ( 8-10 glasses ). Tip: Avoid cranberry juice because it is very acidic.  ACTIVITY: Your physical activity doesn't need to be restricted. However, if you are very active, you may see some blood in your urine. We suggest that you reduce your activity under these circumstances until the bleeding has stopped.  BOWELS: It is important to keep your bowels regular during the postoperative period. Straining with bowel movements can cause bleeding. A bowel movement every other day is reasonable. Use a mild laxative if needed, such as Milk of Magnesia 2-3 tablespoons, or 2 Dulcolax tablets. Call if you continue to have problems. If you have been taking narcotics for pain, before, during or after your surgery, you may be constipated. Take a laxative if necessary.   MEDICATION: You should resume your pre-surgery medications unless told not to. In addition you will often be given an antibiotic to prevent infection. These should be taken as prescribed until the bottles are  finished unless you are having an unusual reaction to one of the drugs.  PROBLEMS YOU SHOULD REPORT TO [place]:  Fevers over 100.5 Fahrenheit.  Heavy bleeding, or clots ( See above notes about blood in urine ).  Inability to urinate.  Drug reactions ( hives, rash, nausea, vomiting, diarrhea ).  Severe burning or pain with urination that is not improving.  FOLLOW-UP: You will need a follow-up appointment to monitor your progress. Call for this appointment at the number listed above. Usually the first appointment will be about three to fourteen days after your surgery.      Post Anesthesia Home Care Instructions  Activity: Get plenty of rest for the remainder of the day. A responsible adult should stay with you for 24 hours following the procedure.  For the next 24 hours, DO NOT: -Drive a car -Paediatric nurse -Drink alcoholic beverages -Take any medication unless instructed by your physician -Make any  legal decisions or sign important papers.  Meals: Start with liquid foods such as gelatin or soup. Progress to regular foods as tolerated. Avoid greasy, spicy, heavy foods. If nausea and/or vomiting occur, drink only clear liquids until the nausea and/or vomiting subsides. Call your physician if vomiting continues.  Special Instructions/Symptoms: Your throat may feel dry or sore from the anesthesia or the breathing tube placed in your throat during surgery. If this causes discomfort, gargle with warm salt water. The discomfort should disappear within 24 hours.  If you had a scopolamine patch placed behind your ear for the management of post- operative nausea and/or vomiting:  1. The medication in the patch is effective for 72 hours, after which it should be removed.  Wrap patch in a tissue and discard in the trash. Wash hands thoroughly with soap and water. 2. You may remove the patch earlier than 72 hours if you experience unpleasant side effects which may include dry mouth, dizziness or visual disturbances. 3. Avoid touching the patch. Wash your hands with soap and water after contact with the patch.

## 2019-12-01 NOTE — Anesthesia Postprocedure Evaluation (Signed)
Anesthesia Post Note  Patient: Sheena Torres  Procedure(s) Performed: CYSTOSCOPY WITH RETROGRADE PYELOGRAM/URETERAL STENT PLACEMENT (Bilateral )     Patient location during evaluation: PACU Anesthesia Type: General Level of consciousness: awake and alert Pain management: pain level controlled Vital Signs Assessment: post-procedure vital signs reviewed and stable Respiratory status: spontaneous breathing, nonlabored ventilation, respiratory function stable and patient connected to nasal cannula oxygen Cardiovascular status: blood pressure returned to baseline and stable Postop Assessment: no apparent nausea or vomiting Anesthetic complications: no   No complications documented.  Last Vitals:  Vitals:   12/01/19 0930 12/01/19 1205  BP: 128/88 119/85  Pulse: 99 89  Resp: 14 14  Temp: 36.7 C 36.7 C  SpO2: 100% 98%    Last Pain:  Vitals:   12/01/19 1205  TempSrc: Oral  PainSc:                  March Rummage Klyde Banka

## 2019-12-01 NOTE — Anesthesia Preprocedure Evaluation (Addendum)
Anesthesia Evaluation  Patient identified by MRN, date of birth, ID band Patient awake  General Assessment Comment:Patient reports "extreme pain" after stent placed 07/21  Reviewed: Patient's Chart, lab work & pertinent test results  Airway Mallampati: II  TM Distance: >3 FB Neck ROM: Full    Dental  (+) Teeth Intact   Pulmonary    Pulmonary exam normal        Cardiovascular negative cardio ROS   Rhythm:Regular Rate:Normal     Neuro/Psych  Headaches, PSYCHIATRIC DISORDERS ADHD   GI/Hepatic Neg liver ROS, IBS   Endo/Other  negative endocrine ROS  Renal/GU Renal disease Bladder dysfunction Female GU complaint Incontinence, chronic cystitis, bilateral ureteral obstruction. Cervical cancer on chemo/rads    Musculoskeletal   Abdominal (+)  Abdomen: soft. Bowel sounds: normal.  Peds  Hematology  (+) anemia ,   Anesthesia Other Findings   Reproductive/Obstetrics                           Anesthesia Physical Anesthesia Plan  ASA: II  Anesthesia Plan: General   Post-op Pain Management:    Induction: Intravenous  PONV Risk Score and Plan: 3 and Dexamethasone, Ondansetron, Midazolam and Treatment may vary due to age or medical condition  Airway Management Planned: Mask and LMA  Additional Equipment: None  Intra-op Plan:   Post-operative Plan: Extubation in OR  Informed Consent: I have reviewed the patients History and Physical, chart, labs and discussed the procedure including the risks, benefits and alternatives for the proposed anesthesia with the patient or authorized representative who has indicated his/her understanding and acceptance.     Dental advisory given  Plan Discussed with: CRNA and Surgeon  Anesthesia Plan Comments: [phone] Nucleic Acid Test Results Lab Results      Component                Value               Date                      SARSCOV2NAA               NEGATIVE            11/27/2019                [place]              NEGATIVE            08/28/2019                [place]              NEGATIVE            05/22/2019                [place]              NEGATIVE            07/08/2018                [place]              NOT DETECTED        06/30/2018           Lab Results      Component                Value  Date                      WBC                      4.6                 11/29/2019                HGB                      9.5 (L)             11/29/2019                HCT                      29.6 (L)            11/29/2019                MCV                      101.7 (H)           11/29/2019                PLT                      300                 11/29/2019           Lab Results      Component                Value               Date                      HCG                      <5.0                09/12/2019          )        Anesthesia Quick Evaluation

## 2019-12-01 NOTE — Op Note (Signed)
Operative Note  Preoperative diagnosis:  1.  Stage IV cervical cancer 2.  Bilateral hydronephrosis  Postoperative diagnosis: 1.  Stage IV cervical cancer 2.  Bilateral hydronephrosis  Procedure(s): 1.  Cystoscopy with bilateral stent exchange 2.  Bilateral retrograde pyelograms with intraoperative interpretation of fluoroscopic imaging  Surgeon: Ellison Hughs, MD  Assistants:  None  Anesthesia:  General  Complications:  None  EBL: Less than 5 mL  Specimens: 1.  Previously placed ureteral stents were removed intact, inspected and discarded  Drains/Catheters: 1.  Left 6 French, 22 cm Polaris stent 2.  Right 5 French, 22 cm Polaris stent (a 5 French stent was utilized due to the lack of additional 6 [place] stents)  Intraoperative findings:   1. Left retrograde pyelogram revealed dilation of the left renal pelvis at the level of the UPJ with no additional filling defects seen within the renal pelvis or along the entire length of the left ureter 2. Right retrograde pyelogram revealed ureteral jetting at the level of the UPJ with uniform dilation of the right renal pelvis and its associated calyces.  No additional filling defects were seen within the right renal pelvis or along the length of the right ureter.  Indication:  Sheena Torres is a 54 y.o. female with stage IV cervical cancer with concomitant bilateral hydronephrosis requiring indwelling ureteral stents.  She has been consented for the above procedures, voices understanding and wishes to proceed.  Description of procedure:  After informed consent was obtained, the patient was brought to the operating room and general LMA anesthesia was administered. The patient was then placed in the dorsolithotomy position and prepped and draped in the usual sterile fashion. A timeout was performed. A 23 French rigid cystoscope was then inserted into the urethral meatus and advanced into the bladder under direct vision. A complete  bladder survey revealed no intravesical pathology.  Her previously placed left ureteral stent was then grasped at the distal curl and retracted to the urethral meatus.  A Glidewire was then used to intubate the stent and was advanced up to the left renal pelvis, or fluoroscopic guidance.  The previously placed stent was then removed intact, inspected and discarded.  A 5 French ureteral catheter was then advanced over the wire and placed into the left ureter, under fluoroscopic guidance.  The wire was then removed.  A left retrograde pyelogram was obtained, with the findings listed above.  The wire was then replaced through the lumen of the ureteral catheter and advanced up to the left renal pelvis, or fluoroscopic guidance.  The ureteral catheter was then removed, leaving the wire in place.  A new 6 French, 22 cm Polaris stent was then advanced over the wire and into good position within the left collecting system, confirming placement via fluoroscopy.  A similar maneuver was then performed to remove her right ureteral stent.  A right retrograde pyelogram was obtained, with the findings listed above.  A new 5 French, 22 cm Polaris stent was then advanced over the wire and into good position within the right collecting system, confirming placement via fluoroscopy.  The patient's bladder was then drained.  She tolerated the procedure well and was transferred to postanesthesia in stable condition.  Plan: Follow-up in 2 months to plan her next stent exchange  Cc: Dr. Heath Lark

## 2019-12-01 NOTE — Anesthesia Procedure Notes (Signed)
Procedure Name: LMA Insertion Date/Time: 12/01/2019 8:38 AM Performed by: Lollie Sails, CRNA Pre-anesthesia Checklist: Patient identified, Emergency Drugs available, Suction available, Patient being monitored and Timeout performed Patient Re-evaluated:Patient Re-evaluated prior to induction Oxygen Delivery Method: Circle system utilized Preoxygenation: Pre-oxygenation with 100% oxygen Induction Type: IV induction Ventilation: Mask ventilation without difficulty LMA: LMA inserted LMA Size: 4.0 Number of attempts: 2 Placement Confirmation: positive ETCO2 and breath sounds checked- equal and bilateral Tube secured with: Tape Dental Injury: Teeth and Oropharynx as per pre-operative assessment

## 2019-12-02 ENCOUNTER — Encounter (HOSPITAL_BASED_OUTPATIENT_CLINIC_OR_DEPARTMENT_OTHER): Payer: Self-pay | Admitting: Urology

## 2019-12-09 ENCOUNTER — Inpatient Hospital Stay: Payer: Medicaid Other

## 2019-12-09 ENCOUNTER — Other Ambulatory Visit: Payer: Self-pay

## 2019-12-09 ENCOUNTER — Inpatient Hospital Stay (HOSPITAL_BASED_OUTPATIENT_CLINIC_OR_DEPARTMENT_OTHER): Payer: Medicaid Other | Admitting: Hematology and Oncology

## 2019-12-09 ENCOUNTER — Other Ambulatory Visit (HOSPITAL_COMMUNITY): Payer: Self-pay | Admitting: Urology

## 2019-12-09 ENCOUNTER — Encounter: Payer: Self-pay | Admitting: Hematology and Oncology

## 2019-12-09 VITALS — HR 107

## 2019-12-09 DIAGNOSIS — K5909 Other constipation: Secondary | ICD-10-CM | POA: Diagnosis not present

## 2019-12-09 DIAGNOSIS — Z7189 Other specified counseling: Secondary | ICD-10-CM

## 2019-12-09 DIAGNOSIS — T451X5A Adverse effect of antineoplastic and immunosuppressive drugs, initial encounter: Secondary | ICD-10-CM

## 2019-12-09 DIAGNOSIS — C530 Malignant neoplasm of endocervix: Secondary | ICD-10-CM

## 2019-12-09 DIAGNOSIS — D6481 Anemia due to antineoplastic chemotherapy: Secondary | ICD-10-CM

## 2019-12-09 DIAGNOSIS — G893 Neoplasm related pain (acute) (chronic): Secondary | ICD-10-CM

## 2019-12-09 DIAGNOSIS — R110 Nausea: Secondary | ICD-10-CM

## 2019-12-09 DIAGNOSIS — Z5112 Encounter for antineoplastic immunotherapy: Secondary | ICD-10-CM | POA: Diagnosis not present

## 2019-12-09 LAB — SAMPLE TO BLOOD BANK

## 2019-12-09 LAB — CBC WITH DIFFERENTIAL (CANCER CENTER ONLY)
Abs Immature Granulocytes: 0.04 10*3/uL (ref 0.00–0.07)
Basophils Absolute: 0 10*3/uL (ref 0.0–0.1)
Basophils Relative: 1 %
Eosinophils Absolute: 0.2 10*3/uL (ref 0.0–0.5)
Eosinophils Relative: 2 %
HCT: 30.5 % — ABNORMAL LOW (ref 36.0–46.0)
Hemoglobin: 9.5 g/dL — ABNORMAL LOW (ref 12.0–15.0)
Immature Granulocytes: 1 %
Lymphocytes Relative: 22 %
Lymphs Abs: 1.8 10*3/uL (ref 0.7–4.0)
MCH: 31.7 pg (ref 26.0–34.0)
MCHC: 31.1 g/dL (ref 30.0–36.0)
MCV: 101.7 fL — ABNORMAL HIGH (ref 80.0–100.0)
Monocytes Absolute: 0.6 10*3/uL (ref 0.1–1.0)
Monocytes Relative: 8 %
Neutro Abs: 5.4 10*3/uL (ref 1.7–7.7)
Neutrophils Relative %: 66 %
Platelet Count: 356 10*3/uL (ref 150–400)
RBC: 3 MIL/uL — ABNORMAL LOW (ref 3.87–5.11)
RDW: 14.9 % (ref 11.5–15.5)
WBC Count: 8 10*3/uL (ref 4.0–10.5)
nRBC: 0 % (ref 0.0–0.2)

## 2019-12-09 LAB — CMP (CANCER CENTER ONLY)
ALT: 8 U/L (ref 0–44)
AST: 17 U/L (ref 15–41)
Albumin: 2.9 g/dL — ABNORMAL LOW (ref 3.5–5.0)
Alkaline Phosphatase: 94 U/L (ref 38–126)
Anion gap: 8 (ref 5–15)
BUN: 14 mg/dL (ref 6–20)
CO2: 29 mmol/L (ref 22–32)
Calcium: 9.5 mg/dL (ref 8.9–10.3)
Chloride: 99 mmol/L (ref 98–111)
Creatinine: 0.92 mg/dL (ref 0.44–1.00)
GFR, Estimated: >60 mL/min (ref >60)
Glucose, Bld: 115 mg/dL — ABNORMAL HIGH (ref 70–99)
Potassium: 3.5 mmol/L (ref 3.5–5.1)
Sodium: 136 mmol/L (ref 135–145)
Total Bilirubin: 0.4 mg/dL (ref 0.3–1.2)
Total Protein: 6.3 g/dL — ABNORMAL LOW (ref 6.5–8.1)

## 2019-12-09 LAB — TSH: TSH: 0.659 u[IU]/mL (ref 0.308–3.960)

## 2019-12-09 MED ORDER — SODIUM CHLORIDE 0.9 % IV SOLN
Freq: Once | INTRAVENOUS | Status: AC
  Filled 2019-12-09: qty 250

## 2019-12-09 MED ORDER — SODIUM CHLORIDE 0.9 % IV SOLN
200.0000 mg | Freq: Once | INTRAVENOUS | Status: AC
  Administered 2019-12-09: 200 mg via INTRAVENOUS
  Filled 2019-12-09: qty 8

## 2019-12-09 MED ORDER — OXYCODONE HCL 10 MG PO TABS
10.0000 mg | ORAL_TABLET | ORAL | 0 refills | Status: DC | PRN
Start: 2019-12-09 — End: 2019-12-28

## 2019-12-09 MED ORDER — CYCLOBENZAPRINE HCL 5 MG PO TABS: 5.0000 mg | ORAL_TABLET | Freq: Three times a day (TID) | ORAL | 0 refills | Status: AC | PRN

## 2019-12-09 MED FILL — CYCLOBENZAPRINE HCL 5 MG TA: 5 | 10 days supply | Qty: 30 | Fill #0

## 2019-12-09 MED FILL — PHENAZOPYRIDINE 200 MG TAB: 200 | 10 days supply | Qty: 30 | Fill #0

## 2019-12-09 MED FILL — oxyCODONE HCL 10 MG TABS: 10 | 10 days supply | Qty: 60 | Fill #0

## 2019-12-09 NOTE — Assessment & Plan Note (Signed)
We discussed the use of antiemetics

## 2019-12-09 NOTE — Assessment & Plan Note (Signed)
Her blood counts are recovering nicely with immunotherapy only I do not believe her recent severe flank pain is related to cancer progression It is more likely related to recent stent exchange We will proceed with treatment without delay I plan to repeat imaging study after 4 cycles of therapy

## 2019-12-09 NOTE — Assessment & Plan Note (Signed)
We have extensive discussions about laxative therapy

## 2019-12-09 NOTE — Progress Notes (Signed)
[facility] OFFICE PROGRESS NOTE  Patient Care Team: [name] as PCP - General (Physician Assistant) Jacqulyn Liner, RN as Oncology Nurse Navigator (Oncology)  ASSESSMENT & PLAN:  Malignant neoplasm of endocervix [facility]) Her blood counts are recovering nicely with immunotherapy only I do not believe her recent severe flank pain is related to cancer progression It is more likely related to recent stent exchange We will proceed with treatment without delay I plan to repeat imaging study after 4 cycles of therapy  Cancer associated pain She has poorly controlled pain Previously, she did not tolerate methadone well She is willing to try again I recommend her to try to take some antiemetics before she takes pain medicine to see if we can avoid nausea related to taking pain medicine I also plan to increase her pain medicine to oxycodone 10 mg as needed and she can take Tylenol along with that I warned her about risk of sedation and constipation  There is also a component of muscle spasm She can try to add muscle relaxant along with her pain medicine but again, I warned her about risk of sedation  Anemia due to antineoplastic chemotherapy She has multifactorial anemia Her anemia is likely due to chronic kidney disease She is not symptomatic She does not need blood transfusion support  Other constipation We have extensive discussions about laxative therapy  Nausea without vomiting We discussed the use of antiemetics   No orders of the defined types were placed in this encounter.   All questions were answered. The patient knows to call the clinic with any problems, questions or concerns. The total time spent in the appointment was 30 minutes encounter with patients including review of chart and various tests results, discussions about plan of care and coordination of care plan   Heath Lark, MD 12/09/2019 12:46 PM  INTERVAL HISTORY: Please see below for  problem oriented charting. She returns with her 2 sons for treatment and follow-up Yesterday, she has severe exacerbation of her flank pain Her flank pain seems to be worse after recent stent exchange She denies dysuria, urinary frequency or urgency She try to add methadone back to her pain regimen but that was exacerbated by nausea and vomiting She is also somewhat constipated No recent fever or chills  SUMMARY OF ONCOLOGIC HISTORY: Oncology History Overview Note  PD-L1 5%   Malignant neoplasm of endocervix ([place])  09/15/2017 Initial Diagnosis   The patient reports postcoital bleeding since August, 2019 and postmenopausal bleeding since November, 2019. 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    04/16/2018 Surgery   She was taken to the OR on 04/16/18 for a cervical cone biopsy. The specimen revealed a grossly normal cervix on the ectocervix.  Cone specimen revealed squamous cell carcinoma.  When discussing this with the pathologist he reported that in the actual cone specimen itself that was CIN 3 however there was a separate fragment of tissue that included carcinoma.  The dimensions of this was 0.6 cm, however this involved the margins.  A post cone ECC was positive for squamous cell carcinoma, as well as an endometrial curettage which also contain benign endometrial glands.    04/16/2018 Pathology Results   Endocervix curettage: [facility]   04/22/2018 Initial Diagnosis   Malignant neoplasm of endocervix ([place])   04/24/2018 PET scan   1. There is a large mass involving  the cervix and endometrium which has a maximum dimension of 5.3 cm within SUV max of 20.4. 2. Small bilateral pelvic sidewall lymph nodes measuring less than 1cm with mild nonspecific uptake. The no hypermetabolic adenopathy or evidence of distant metastatic disease. 3. Nonspecific pulmonary nodules measuring less than 5 mm are identified in the right  lung. Too small to characterize by PET-CT.    04/27/2018 Imaging   MRI pelvis  1. Uterine cervix 5.5 x 3.4 x 3.2 cm mass compatible with primary cervical malignancy, with mild parametrial invasion. Stage IIB by MRI. 2. Small volume simple free fluid in the pelvic cul-de-sac. 3. No pathologically enlarged pelvic lymph nodes, see comments.   05/01/2018 Cancer Staging   Staging form: Cervix Uteri, AJCC 8th Edition - Clinical: FIGO Stage IIB (cT2b, cN0, cM0) - Signed by Heath Lark, MD on 05/01/2018   05/08/2018 Procedure   Placement of a subcutaneous port device. Catheter tip at the superior cavoatrial junction.   05/14/2018 - 07/27/2018 Radiation Therapy   Radiation treatment dates:    1. 05/14/2018 - 06/24/2018 2. 5/21, 5/28, 6/1, 6/9, 07/27/2018   Site/dose:    1.         A. pelvsi / 45 Gy in 25 fractions              B. Parametrial  boost / 9 Gy in 5 fractions 2. Cervix, Tandem and Ring System / 27.5 Gy in 5 fractions     05/15/2018 - 06/26/2018 Chemotherapy   The patient had weekly cisplatin   10/27/2018 PET scan   Near complete resolution of FDG uptake at site of previously seen cervical mass. Resolution of previously seen sub-cm pelvic lymph nodes. No evidence of metabolically-active metastatic disease.   05/13/2019 Imaging   1. There is ill-defined, hypoenhancing soft tissue most clearly visualized at the left aspect of the uterus and adnexa, which obstructs the distal left ureter, measuring approximately 4.0 x 3.4 cm. This is concerning for local recurrence of cervical malignancy.   2. Ill-defined, hypoenhancing appearance of the cervix with loss of distinction of the fat plane to the posterior urinary bladder, in keeping with primary malignancy.   3. Interval enlargement of numerous abnormal retroperitoneal and bilateral inguinal lymph nodes, consistent with nodal metastatic disease.    4. There is new moderate left, mild right hydronephrosis and hydroureter, with soft tissue described  above obstructing the distal left ureter in the vicinity of the adnexa. There is high-grade obstruction of the distal left ureter with no excreted contrast on delayed phase imaging. Point of obstruction of the right ureter is not clearly visualized, possibly at the ureterovesicular junction.      Genetic Testing   Patient has genetic testing done for PD-L1. Results revealed patient has the following: PD-L1 staining in tumor cells (TC): 5% PD-L1 staining in tumor associated immune cells (IC): <1% PD-L1 combined positive score (CPS): 5%   05/28/2019 -  Chemotherapy   The patient had carboplatin and taxol for chemotherapy treatment.     07/29/2019 Imaging   1. Mild improvement in mild left paraaortic retroperitoneal lymphadenopathy. No new or progressive metastatic disease identified. 2. No significant change in size of soft tissue mass in the left adnexa and parametrium. 3. Stable diffuse bladder wall thickening, consistent with cystitis and likely due to previous radiation therapy. 4. Interval placement of bilateral ureteral stents, with resolution of bilateral hydroureteronephrosis since prior study. 5. Stable diffuse left renal parenchymal atrophy. 6. Large stool burden noted; recommend clinical correlation  for possible constipation.   09/01/2019 Surgery   Operative Note   Preoperative diagnosis:  1.  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  Same   Procedure(s): 1.  Cystoscopy with bilateral stent exchange 2.  Bilateral retrograde pyelograms with intraoperative interpretation of fluoroscopic imaging   Surgeon: Ellison Hughs, MD    Specimens: 1.  Previously placed bilateral JJ stents were removed intact, inspected and discarded   Drains/Catheters: 1.  Bilateral 6 French, 24 cm Polaris stents without tethers   Intraoperative findings:   1. Right retrograde pyelogram revealed dilation starting in the mid to  proximal aspects of the right ureter, extending up to the right renal pelvis.  No distal filling defects were identified. 2. Left retrograde pyelogram revealed dilation starting in the mid to proximal aspects of the left ureter, extending up to the left renal pelvis.  No distal filling defects were identified   10/14/2019 Imaging   1. Small RIGHT effusion increased from the previous exam with some suggestion of early diaphragmatic nodularity and with soft tissue nodules or small lymph nodes in the cardiophrenic recess, this along with thoracic inlet adenopathy compatible with disease in the chest. 2. Increase in abdominal ascites and subjective increase in fascial thickening in the lower abdomen and pelvis may reflect worsening of disease in this area. Attention on follow-up. 3. Accurate measurements are difficult with respect adnexal structures, increased fullness of the RIGHT adnexa since previous imaging. 4. Retroperitoneal lymph nodes with similar appearance. Stranding about retroperitoneal vessels with some increase. 5. Signs of presumed cystitis with bilateral nephroureteral stents in place not significantly changed from the previous study. 6. Post radiation changes about the rectum. Slightly indistinct appearance of the distal colon along the descending and sigmoid favored to represent increasing serosal involvement in the setting of peritoneal disease. Correlate with any clinical evidence of colitis. 7. Aortic atherosclerosis.   10/28/2019 -  Chemotherapy   The patient had pembrolizumab for chemotherapy treatment.     12/01/2019 Surgery   Preoperative diagnosis:  1.  Stage IV cervical cancer 2.  Bilateral hydronephrosis   Postoperative diagnosis: 1.  Stage IV cervical cancer 2.  Bilateral hydronephrosis   Procedure(s): 1.  Cystoscopy with bilateral stent exchange 2.  Bilateral retrograde pyelograms with intraoperative interpretation of fluoroscopic imaging   Surgeon: Ellison Hughs, MD Drains/Catheters: 1.  Left 6 French, 22 cm Polaris stent 2.  Right 5 French, 22 cm Polaris stent (a 5 French stent was utilized due to the lack of additional 6 [place] stents)   Intraoperative findings:   1. Left retrograde pyelogram revealed dilation of the left renal pelvis at the level of the UPJ with no additional filling defects seen within the renal pelvis or along the entire length of the left ureter 2. Right retrograde pyelogram revealed ureteral jetting at the level of the UPJ with uniform dilation of the right renal pelvis and its associated calyces.  No additional filling defects were seen within the right renal pelvis or along the length of the right ureter.     REVIEW OF SYSTEMS:   Constitutional: Denies fevers, chills or abnormal weight loss Eyes: Denies blurriness of vision Ears, nose, mouth, throat, and face: Denies mucositis or sore throat Respiratory: Denies cough, dyspnea or wheezes Cardiovascular: Denies palpitation, chest discomfort or lower extremity swelling Skin: Denies abnormal skin rashes Lymphatics: Denies new lymphadenopathy or easy bruising Neurological:Denies numbness, tingling or new weaknesses Behavioral/Psych: Mood is stable, no new changes  All other systems were reviewed with the patient and are negative.  I have reviewed the past medical history, past surgical history, social history and family history with the patient and they are unchanged from previous note.  ALLERGIES:  has No Known Allergies.  MEDICATIONS:  Current Outpatient Medications  Medication Sig Dispense Refill  . ondansetron (ZOFRAN) 8 MG tablet Take 8 mg by mouth every 8 (eight) hours as needed.    Marland Kitchen acetaminophen (TYLENOL) 325 MG tablet Take 650 mg by mouth every 6 (six) hours as needed. 08-23-2019 Pt takes one tablet twice daily with one percocet    . cyclobenzaprine (FLEXERIL) 5 MG tablet Take 1 tablet (5 mg total) by mouth 3 (three) times daily as needed for muscle  spasms. 30 tablet 0  . methadone (DOLOPHINE) 10 MG tablet Take 1 tablet (10 mg total) by mouth every 12 (twelve) hours. (Patient not taking: Reported on 11/25/2019) 30 tablet 0  . neomycin-bacitracin-polymyxin (NEOSPORIN) ointment Apply 1 application topically every 12 (twelve) hours. To back prn    . oxybutynin (DITROPAN) 5 MG tablet Take 1 tablet (5 mg total) by mouth every 8 (eight) hours as needed for bladder spasms. (Patient taking differently: Take 5 mg by mouth 3 (three) times daily. ) 30 tablet 1  . oxyCODONE 10 MG TABS Take 1 tablet (10 mg total) by mouth every 4 (four) hours as needed for severe pain. 60 tablet 0  . prochlorperazine (COMPAZINE) 10 MG tablet Take 1 tablet (10 mg total) by mouth every 6 (six) hours as needed (Nausea or vomiting). 30 tablet 1  . SUMAtriptan (IMITREX) 100 MG tablet Take 100 mg by mouth every 2 (two) hours as needed for migraine. May repeat in 2 hours if headache persists or recurs.    . tamsulosin (FLOMAX) 0.4 MG CAPS capsule Take 0.4 mg by mouth 2 (two) times daily.     No current facility-administered medications for this visit.   Facility-Administered Medications Ordered in Other Visits  Medication Dose Route Frequency Provider Last Rate Last Admin  . pembrolizumab (KEYTRUDA) 200 mg in sodium chloride 0.9 % 50 mL chemo infusion  200 mg Intravenous Once Alvy Bimler, Ni, MD 116 mL/hr at 12/09/19 1220 200 mg at 12/09/19 1220    PHYSICAL EXAMINATION: ECOG PERFORMANCE STATUS: 1 - Symptomatic but completely ambulatory  Vitals:   12/09/19 0955  BP: 120/80  Pulse: (!) 121  Resp: 20  Temp: (!) 96.5 F (35.8 C)  SpO2: 97%   Filed Weights   12/09/19 0955  Weight: 147 lb (66.7 kg)    GENERAL:alert, no distress and comfortable Musculoskeletal:no cyanosis of digits and no clubbing  NEURO: alert & oriented x 3 with fluent speech, no focal motor/sensory deficits  LABORATORY DATA:  I have reviewed the data as listed    Component Value Date/Time   NA 136  12/09/2019 0934   K 3.5 12/09/2019 0934   CL 99 12/09/2019 0934   CO2 29 12/09/2019 0934   GLUCOSE 115 (H) 12/09/2019 0934   BUN 14 12/09/2019 0934   CREATININE 0.92 12/09/2019 0934   CALCIUM 9.5 12/09/2019 0934   PROT 6.3 (L) 12/09/2019 0934   ALBUMIN 2.9 (L) 12/09/2019 0934   AST 17 12/09/2019 0934   ALT 8 12/09/2019 0934   ALKPHOS 94 12/09/2019 0934   BILITOT 0.4 12/09/2019 0934   GFRNONAA >60 12/09/2019 0934   GFRAA >60 11/11/2019 1109    No results found for: SPEP, UPEP  Lab Results  Component Value Date  WBC 8.0 12/09/2019   NEUTROABS 5.4 12/09/2019   HGB 9.5 (L) 12/09/2019   HCT 30.5 (L) 12/09/2019   MCV 101.7 (H) 12/09/2019   PLT 356 12/09/2019      Chemistry      Component Value Date/Time   NA 136 12/09/2019 0934   K 3.5 12/09/2019 0934   CL 99 12/09/2019 0934   CO2 29 12/09/2019 0934   BUN 14 12/09/2019 0934   CREATININE 0.92 12/09/2019 0934      Component Value Date/Time   CALCIUM 9.5 12/09/2019 0934   ALKPHOS 94 12/09/2019 0934   AST 17 12/09/2019 0934   ALT 8 12/09/2019 0934   BILITOT 0.4 12/09/2019 0934

## 2019-12-09 NOTE — Assessment & Plan Note (Addendum)
She has poorly controlled pain Previously, she did not tolerate methadone well She is willing to try again I recommend her to try to take some antiemetics before she takes pain medicine to see if we can avoid nausea related to taking pain medicine I also plan to increase her pain medicine to oxycodone 10 mg as needed and she can take Tylenol along with that I warned her about risk of sedation and constipation  There is also a component of muscle spasm She can try to add muscle relaxant along with her pain medicine but again, I warned her about risk of sedation

## 2019-12-09 NOTE — Progress Notes (Signed)
Per Dr. Alvy Bimler, [place] to treat with elevated pulse.

## 2019-12-09 NOTE — Assessment & Plan Note (Signed)
She has multifactorial anemia Her anemia is likely due to chronic kidney disease She is not symptomatic She does not need blood transfusion support

## 2019-12-09 NOTE — Patient Instructions (Signed)
[facility] Discharge Instructions for Patients Receiving Chemotherapy  Today you received the following chemotherapy agents:  Keytruda.  To help prevent nausea and vomiting after your treatment, we encourage you to take your nausea medication as directed.   If you develop nausea and vomiting that is not controlled by your nausea medication, call the clinic.   BELOW ARE SYMPTOMS THAT SHOULD BE REPORTED IMMEDIATELY:  *FEVER GREATER THAN 100.5 F  *CHILLS WITH OR WITHOUT FEVER  NAUSEA AND VOMITING THAT IS NOT CONTROLLED WITH YOUR NAUSEA MEDICATION  *UNUSUAL SHORTNESS OF BREATH  *UNUSUAL BRUISING OR BLEEDING  TENDERNESS IN MOUTH AND THROAT WITH OR WITHOUT PRESENCE OF ULCERS  *URINARY PROBLEMS  *BOWEL PROBLEMS  UNUSUAL RASH Items with * indicate a potential emergency and should be followed up as soon as possible.  Feel free to call the clinic should you have any questions or concerns. The clinic phone number is [phone].  Please show the CHEMO ALERT CARD at check-in to the Emergency Department and triage nurse.    

## 2019-12-10 LAB — T4: T4, Total: 8.6 ug/dL (ref 4.5–12.0)

## 2019-12-11 MED FILL — TAMSULOSIN HCL 0.4 MG CAP: 0.4 | 30 days supply | Qty: 60 | Fill #1

## 2019-12-13 ENCOUNTER — Telehealth: Payer: Self-pay

## 2019-12-13 NOTE — Telephone Encounter (Signed)
-----   Message from Heath Lark, MD sent at 12/13/2019  7:45 AM EDT ----- Regarding: pls call her Can you call and ask how is she doing? Pain ok? Nausea/constipation?

## 2019-12-13 NOTE — Telephone Encounter (Signed)
Called and given below message. She verbalized understanding. She is feeling better. She was having back pain this morning at 7 am and took the flexeril. She went back to sleep and feels better. She noticed today that in the middle of her back she has a bruise and it is swollen. Denies injury. She thinks that her new recliner may have caused back pain/ bruising? Denies nausea. She is not eating much. She is able to drink with no problems. No bm in 4 days and she taking the Miralax. Instructed to take Miralax BID instead of daily.

## 2019-12-14 ENCOUNTER — Other Ambulatory Visit: Payer: Self-pay

## 2019-12-14 ENCOUNTER — Telehealth: Payer: Self-pay

## 2019-12-14 MED ORDER — MAGNESIUM CITRATE PO SOLN: 1.0000 | Freq: Once | ORAL | 0 refills | Status: AC

## 2019-12-14 NOTE — Telephone Encounter (Signed)
Called back and scheduled appt for 0840 tomorrow. She is aware of time. She was grateful for appt.

## 2019-12-14 NOTE — Telephone Encounter (Signed)
Called and given below message. She verbalized understanding. No bm. Magnesium citrate Rx sent to her preferred pharmacy. She will take   She is concerned about lower mid back area with bruising and swelling. She thinks the swelling is unchanged from yesterday. She is applying ice/cold prn. She is requesting appt with Dr. Alvy Bimler when you have time or she will wait until next scheduled appt.

## 2019-12-14 NOTE — Telephone Encounter (Signed)
-----   Message from Heath Lark, MD sent at 12/14/2019 10:01 AM EDT ----- Regarding: constipation If still no bowel movement call in magnesium citrate

## 2019-12-15 ENCOUNTER — Encounter: Payer: Self-pay | Admitting: Hematology and Oncology

## 2019-12-15 ENCOUNTER — Inpatient Hospital Stay: Payer: Medicaid Other | Attending: Hematology and Oncology | Admitting: Hematology and Oncology

## 2019-12-15 ENCOUNTER — Other Ambulatory Visit: Payer: Self-pay

## 2019-12-15 DIAGNOSIS — C530 Malignant neoplasm of endocervix: Secondary | ICD-10-CM | POA: Insufficient documentation

## 2019-12-15 DIAGNOSIS — Z5112 Encounter for antineoplastic immunotherapy: Secondary | ICD-10-CM | POA: Insufficient documentation

## 2019-12-15 DIAGNOSIS — G893 Neoplasm related pain (acute) (chronic): Secondary | ICD-10-CM

## 2019-12-15 DIAGNOSIS — K5909 Other constipation: Secondary | ICD-10-CM | POA: Diagnosis not present

## 2019-12-15 DIAGNOSIS — T148XXA Other injury of unspecified body region, initial encounter: Secondary | ICD-10-CM | POA: Diagnosis not present

## 2019-12-15 DIAGNOSIS — Z79899 Other long term (current) drug therapy: Secondary | ICD-10-CM | POA: Diagnosis not present

## 2019-12-15 NOTE — Assessment & Plan Note (Signed)
She has no bowel movement for 5 days She is taking daily MiraLAX and yesterday we prescribed magnesium citrate which she did not tolerate well She has not taking Senokot for some time and I recommend her to resume taking Senokot today I will call her tomorrow to check on her If she still have no bowel movement, we will prescribe lactulose, in addition to all her current prescribed laxatives

## 2019-12-15 NOTE — Assessment & Plan Note (Signed)
I reviewed her previous CT imaging from September Even though it was not in the report, I can see some skin bruising on that CT imaging She will likely have significant bruising due to history of severe thrombocytopenia and her skin bruising is slow to recover due to recent chemotherapy She is reassured

## 2019-12-15 NOTE — Progress Notes (Signed)
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[facility]) She tolerated her recent treatment well Her symptoms are not related to side effects of therapy I will see her again in a few weeks when her treatment is due  Cancer associated pain She tolerated the addition of methadone and recent dose adjustment of her pain medication better However, this is complicated by severe constipation She will continue prescribed pain medicine   Other constipation She has no bowel movement for 5 days She is taking daily MiraLAX and yesterday we prescribed magnesium citrate which she did not tolerate well She has not taking Senokot for some time and I recommend her to resume taking Senokot today I will call her tomorrow to check on her If she still have no bowel movement, we will prescribe lactulose, in addition to all her current prescribed laxatives  Superficial bruising I reviewed her previous CT imaging from September Even though it was not in the report, I can see some skin bruising on that CT imaging She will likely have significant bruising due to history of severe thrombocytopenia and her skin bruising is slow to recover due to recent chemotherapy She is reassured   No orders of the defined types were placed in this encounter.   All questions were answered. The patient knows to call the clinic with any problems, questions or concerns. The total time spent in the appointment was 25 minutes encounter with patients including review of chart and various tests results, discussions about plan of care and coordination of care plan   Heath Lark, MD 12/15/2019 9:18 AM  INTERVAL HISTORY: Please see below for problem oriented charting. She returns with her son, Arnette Norris, for further follow-up She is very  concerned about pain at the area of skin bruising on her lower back that has been present for some time Her pain is better controlled with the addition of methadone She denies side effects from the methadone She is profoundly constipated and had no bowel movement for 5 days She is taking daily MiraLAX Yesterday, she did not read the instruction of magnesium citrate.  She did not dilute out the drink and cause some superficial mucositis in her lower lip  SUMMARY OF ONCOLOGIC HISTORY: Oncology History Overview Note  PD-L1 5%   Malignant neoplasm of endocervix ([place])  09/15/2017 Initial Diagnosis   The patient reports postcoital bleeding since August, 2019 and postmenopausal bleeding since November, 2019. 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    04/16/2018 Surgery   She was taken to the OR on 04/16/18 for a cervical cone biopsy. The specimen revealed a grossly normal cervix on the ectocervix.  Cone specimen revealed squamous cell carcinoma.  When discussing this with the pathologist he reported that in the actual cone specimen itself that was CIN 3 however there was a separate fragment of tissue that included carcinoma.  The dimensions of this was 0.6 cm, however this involved the margins.  A post cone ECC was positive for squamous cell carcinoma, as well as an endometrial curettage which also contain benign endometrial glands.    04/16/2018 Pathology Results   Endocervix curettage: [facility]   04/22/2018 Initial Diagnosis   Malignant neoplasm of endocervix ([place])   04/24/2018  PET scan   1. There is a large mass involving the cervix and endometrium which has a maximum dimension of 5.3 cm within SUV max of 20.4. 2. Small bilateral pelvic sidewall lymph nodes measuring less than 1cm with mild nonspecific uptake. The no hypermetabolic adenopathy or evidence of distant metastatic disease. 3. Nonspecific pulmonary nodules  measuring less than 5 mm are identified in the right lung. Too small to characterize by PET-CT.    04/27/2018 Imaging   MRI pelvis  1. Uterine cervix 5.5 x 3.4 x 3.2 cm mass compatible with primary cervical malignancy, with mild parametrial invasion. Stage IIB by MRI. 2. Small volume simple free fluid in the pelvic cul-de-sac. 3. No pathologically enlarged pelvic lymph nodes, see comments.   05/01/2018 Cancer Staging   Staging form: Cervix Uteri, AJCC 8th Edition - Clinical: FIGO Stage IIB (cT2b, cN0, cM0) - Signed by Heath Lark, MD on 05/01/2018   05/08/2018 Procedure   Placement of a subcutaneous port device. Catheter tip at the superior cavoatrial junction.   05/14/2018 - 07/27/2018 Radiation Therapy   Radiation treatment dates:    1. 05/14/2018 - 06/24/2018 2. 5/21, 5/28, 6/1, 6/9, 07/27/2018   Site/dose:    1.         A. pelvsi / 45 Gy in 25 fractions              B. Parametrial  boost / 9 Gy in 5 fractions 2. Cervix, Tandem and Ring System / 27.5 Gy in 5 fractions     05/15/2018 - 06/26/2018 Chemotherapy   The patient had weekly cisplatin   10/27/2018 PET scan   Near complete resolution of FDG uptake at site of previously seen cervical mass. Resolution of previously seen sub-cm pelvic lymph nodes. No evidence of metabolically-active metastatic disease.   05/13/2019 Imaging   1. There is ill-defined, hypoenhancing soft tissue most clearly visualized at the left aspect of the uterus and adnexa, which obstructs the distal left ureter, measuring approximately 4.0 x 3.4 cm. This is concerning for local recurrence of cervical malignancy.   2. Ill-defined, hypoenhancing appearance of the cervix with loss of distinction of the fat plane to the posterior urinary bladder, in keeping with primary malignancy.   3. Interval enlargement of numerous abnormal retroperitoneal and bilateral inguinal lymph nodes, consistent with nodal metastatic disease.    4. There is new moderate left, mild right  hydronephrosis and hydroureter, with soft tissue described above obstructing the distal left ureter in the vicinity of the adnexa. There is high-grade obstruction of the distal left ureter with no excreted contrast on delayed phase imaging. Point of obstruction of the right ureter is not clearly visualized, possibly at the ureterovesicular junction.      Genetic Testing   Patient has genetic testing done for PD-L1. Results revealed patient has the following: PD-L1 staining in tumor cells (TC): 5% PD-L1 staining in tumor associated immune cells (IC): <1% PD-L1 combined positive score (CPS): 5%   05/28/2019 -  Chemotherapy   The patient had carboplatin and taxol for chemotherapy treatment.     07/29/2019 Imaging   1. Mild improvement in mild left paraaortic retroperitoneal lymphadenopathy. No new or progressive metastatic disease identified. 2. No significant change in size of soft tissue mass in the left adnexa and parametrium. 3. Stable diffuse bladder wall thickening, consistent with cystitis and likely due to previous radiation therapy. 4. Interval placement of bilateral ureteral stents, with resolution of bilateral hydroureteronephrosis since prior study. 5. Stable diffuse left  renal parenchymal atrophy. 6. Large stool burden noted; recommend clinical correlation for possible constipation.   09/01/2019 Surgery   Operative Note   Preoperative diagnosis:  1.  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  Same   Procedure(s): 1.  Cystoscopy with bilateral stent exchange 2.  Bilateral retrograde pyelograms with intraoperative interpretation of fluoroscopic imaging   Surgeon: Ellison Hughs, MD    Specimens: 1.  Previously placed bilateral JJ stents were removed intact, inspected and discarded   Drains/Catheters: 1.  Bilateral 6 French, 24 cm Polaris stents without tethers   Intraoperative findings:   1. Right retrograde  pyelogram revealed dilation starting in the mid to proximal aspects of the right ureter, extending up to the right renal pelvis.  No distal filling defects were identified. 2. Left retrograde pyelogram revealed dilation starting in the mid to proximal aspects of the left ureter, extending up to the left renal pelvis.  No distal filling defects were identified   10/14/2019 Imaging   1. Small RIGHT effusion increased from the previous exam with some suggestion of early diaphragmatic nodularity and with soft tissue nodules or small lymph nodes in the cardiophrenic recess, this along with thoracic inlet adenopathy compatible with disease in the chest. 2. Increase in abdominal ascites and subjective increase in fascial thickening in the lower abdomen and pelvis may reflect worsening of disease in this area. Attention on follow-up. 3. Accurate measurements are difficult with respect adnexal structures, increased fullness of the RIGHT adnexa since previous imaging. 4. Retroperitoneal lymph nodes with similar appearance. Stranding about retroperitoneal vessels with some increase. 5. Signs of presumed cystitis with bilateral nephroureteral stents in place not significantly changed from the previous study. 6. Post radiation changes about the rectum. Slightly indistinct appearance of the distal colon along the descending and sigmoid favored to represent increasing serosal involvement in the setting of peritoneal disease. Correlate with any clinical evidence of colitis. 7. Aortic atherosclerosis.   10/28/2019 -  Chemotherapy   The patient had pembrolizumab for chemotherapy treatment.     12/01/2019 Surgery   Preoperative diagnosis:  1.  Stage IV cervical cancer 2.  Bilateral hydronephrosis   Postoperative diagnosis: 1.  Stage IV cervical cancer 2.  Bilateral hydronephrosis   Procedure(s): 1.  Cystoscopy with bilateral stent exchange 2.  Bilateral retrograde pyelograms with intraoperative interpretation  of fluoroscopic imaging   Surgeon: Ellison Hughs, MD Drains/Catheters: 1.  Left 6 French, 22 cm Polaris stent 2.  Right 5 French, 22 cm Polaris stent (a 5 French stent was utilized due to the lack of additional 6 [place] stents)   Intraoperative findings:   1. Left retrograde pyelogram revealed dilation of the left renal pelvis at the level of the UPJ with no additional filling defects seen within the renal pelvis or along the entire length of the left ureter 2. Right retrograde pyelogram revealed ureteral jetting at the level of the UPJ with uniform dilation of the right renal pelvis and its associated calyces.  No additional filling defects were seen within the right renal pelvis or along the length of the right ureter.     REVIEW OF SYSTEMS:   Constitutional: Denies fevers, chills or abnormal weight loss Eyes: Denies blurriness of vision Ears, nose, mouth, throat, and face: Denies mucositis or sore throat Respiratory: Denies cough, dyspnea or wheezes Cardiovascular: Denies palpitation, chest discomfort or lower extremity swelling Lymphatics: Denies new lymphadenopathy or easy bruising Neurological:Denies numbness, tingling or new weaknesses Behavioral/Psych: Mood  is stable, no new changes  All other systems were reviewed with the patient and are negative.  I have reviewed the past medical history, past surgical history, social history and family history with the patient and they are unchanged from previous note.  ALLERGIES:  has No Known Allergies.  MEDICATIONS:  Current Outpatient Medications  Medication Sig Dispense Refill  . acetaminophen (TYLENOL) 325 MG tablet Take 650 mg by mouth every 6 (six) hours as needed. 08-23-2019 Pt takes one tablet twice daily with one percocet    . cyclobenzaprine (FLEXERIL) 5 MG tablet Take 1 tablet (5 mg total) by mouth 3 (three) times daily as needed for muscle spasms. 30 tablet 0  . methadone (DOLOPHINE) 10 MG tablet Take 1 tablet (10 mg  total) by mouth every 12 (twelve) hours. (Patient not taking: Reported on 11/25/2019) 30 tablet 0  . neomycin-bacitracin-polymyxin (NEOSPORIN) ointment Apply 1 application topically every 12 (twelve) hours. To back prn    . ondansetron (ZOFRAN) 8 MG tablet Take 8 mg by mouth every 8 (eight) hours as needed.    Marland Kitchen oxybutynin (DITROPAN) 5 MG tablet Take 1 tablet (5 mg total) by mouth every 8 (eight) hours as needed for bladder spasms. (Patient taking differently: Take 5 mg by mouth 3 (three) times daily. ) 30 tablet 1  . oxyCODONE 10 MG TABS Take 1 tablet (10 mg total) by mouth every 4 (four) hours as needed for severe pain. 60 tablet 0  . prochlorperazine (COMPAZINE) 10 MG tablet Take 1 tablet (10 mg total) by mouth every 6 (six) hours as needed (Nausea or vomiting). 30 tablet 1  . SUMAtriptan (IMITREX) 100 MG tablet Take 100 mg by mouth every 2 (two) hours as needed for migraine. May repeat in 2 hours if headache persists or recurs.    . tamsulosin (FLOMAX) 0.4 MG CAPS capsule Take 0.4 mg by mouth 2 (two) times daily.     No current facility-administered medications for this visit.    PHYSICAL EXAMINATION: ECOG PERFORMANCE STATUS: 1 - Symptomatic but completely ambulatory  Vitals:   12/15/19 0853  BP: 114/72  Pulse: (!) 120  Resp: 18  Temp: 97.7 F (36.5 C)  SpO2: 96%   Filed Weights   12/15/19 0853  Weight: 153 lb 12.8 oz (69.8 kg)    GENERAL:alert, no distress and comfortable SKIN: Noted minor skin bruising on her lower back NEURO: alert & oriented x 3 with fluent speech, no focal motor/sensory deficits  LABORATORY DATA:  I have reviewed the data as listed    Component Value Date/Time   NA 136 12/09/2019 0934   K 3.5 12/09/2019 0934   CL 99 12/09/2019 0934   CO2 29 12/09/2019 0934   GLUCOSE 115 (H) 12/09/2019 0934   BUN 14 12/09/2019 0934   CREATININE 0.92 12/09/2019 0934   CALCIUM 9.5 12/09/2019 0934   PROT 6.3 (L) 12/09/2019 0934   ALBUMIN 2.9 (L) 12/09/2019 0934    AST 17 12/09/2019 0934   ALT 8 12/09/2019 0934   ALKPHOS 94 12/09/2019 0934   BILITOT 0.4 12/09/2019 0934   GFRNONAA >60 12/09/2019 0934   GFRAA >60 11/11/2019 1109    No results found for: SPEP, UPEP  Lab Results  Component Value Date   WBC 8.0 12/09/2019   NEUTROABS 5.4 12/09/2019   HGB 9.5 (L) 12/09/2019   HCT 30.5 (L) 12/09/2019   MCV 101.7 (H) 12/09/2019   PLT 356 12/09/2019      Chemistry  Component Value Date/Time   NA 136 12/09/2019 0934   K 3.5 12/09/2019 0934   CL 99 12/09/2019 0934   CO2 29 12/09/2019 0934   BUN 14 12/09/2019 0934   CREATININE 0.92 12/09/2019 0934      Component Value Date/Time   CALCIUM 9.5 12/09/2019 0934   ALKPHOS 94 12/09/2019 0934   AST 17 12/09/2019 0934   ALT 8 12/09/2019 0934   BILITOT 0.4 12/09/2019 0934

## 2019-12-15 NOTE — Assessment & Plan Note (Signed)
She tolerated her recent treatment well Her symptoms are not related to side effects of therapy I will see her again in a few weeks when her treatment is due

## 2019-12-15 NOTE — Assessment & Plan Note (Signed)
She tolerated the addition of methadone and recent dose adjustment of her pain medication better However, this is complicated by severe constipation She will continue prescribed pain medicine

## 2019-12-16 ENCOUNTER — Other Ambulatory Visit: Payer: Self-pay

## 2019-12-16 ENCOUNTER — Telehealth: Payer: Self-pay

## 2019-12-16 ENCOUNTER — Other Ambulatory Visit: Payer: Self-pay | Admitting: Hematology and Oncology

## 2019-12-16 DIAGNOSIS — C530 Malignant neoplasm of endocervix: Secondary | ICD-10-CM

## 2019-12-16 DIAGNOSIS — K5909 Other constipation: Secondary | ICD-10-CM

## 2019-12-16 MED ORDER — LACTULOSE 10 GM/15ML PO SOLN: 10.0000 g | Freq: Three times a day (TID) | ORAL | 0 refills | Status: AC

## 2019-12-16 NOTE — Telephone Encounter (Signed)
Called and given below message. She verbalized understanding. She had a small bm this morning and feels like she is going to have a larger bm anytime now. She is taking Miralax and Senokot as instructed. She will call the office back if she does not get good relief.

## 2019-12-16 NOTE — Telephone Encounter (Signed)
Called her back. She is requesting [place] Rx now. No further bm today. Ask her to call the office back tomorrow with a update. She verbalzied understanding.

## 2019-12-16 NOTE — Telephone Encounter (Signed)
-----   Message from Heath Lark, MD sent at 12/16/2019  7:03 AM EDT ----- Regarding: constipation Can you call and ask if her constipation has resolved yet? If not, call in lactulose 10 ml TID

## 2019-12-17 ENCOUNTER — Telehealth: Payer: Self-pay

## 2019-12-17 NOTE — Telephone Encounter (Signed)
Called and given below message. She verbalized understanding. She had x 2 bm's last night and x 2 today. She is feeling a lot better.

## 2019-12-17 NOTE — Telephone Encounter (Signed)
-----   Message from Heath Lark, MD sent at 12/17/2019  8:06 AM EDT ----- Regarding: can you call if she has BM yet?

## 2019-12-21 ENCOUNTER — Other Ambulatory Visit: Payer: Self-pay | Admitting: Hematology and Oncology

## 2019-12-21 ENCOUNTER — Telehealth: Payer: Self-pay | Admitting: Oncology

## 2019-12-21 ENCOUNTER — Other Ambulatory Visit (HOSPITAL_COMMUNITY): Payer: Self-pay | Admitting: Hematology and Oncology

## 2019-12-21 DIAGNOSIS — G893 Neoplasm related pain (acute) (chronic): Secondary | ICD-10-CM

## 2019-12-21 MED ORDER — METHADONE HCL 10 MG PO TABS: 10.0000 mg | ORAL_TABLET | Freq: Two times a day (BID) | ORAL | 0 refills | Status: AC

## 2019-12-21 MED FILL — METHADONE HCL 10 MG TABLET: 10 | 30 days supply | Qty: 60 | Fill #0

## 2019-12-21 NOTE — Telephone Encounter (Signed)
done

## 2019-12-21 NOTE — Telephone Encounter (Signed)
Called Lynnette and let her know the refill has been sent.

## 2019-12-21 NOTE — Telephone Encounter (Signed)
Sheena Torres called and asked for a refill of Methadone.  She would like it sent to [facility]

## 2019-12-22 ENCOUNTER — Telehealth: Payer: Self-pay | Admitting: Oncology

## 2019-12-22 NOTE — Telephone Encounter (Signed)
Sheena Torres called to go over her upcoming appointments.  She also mentioned that she is seeing Dr. Lovena Neighbours tomorrow because she feels like she is constantly leaking urine.

## 2019-12-23 ENCOUNTER — Other Ambulatory Visit (HOSPITAL_COMMUNITY): Payer: Self-pay | Admitting: Urology

## 2019-12-23 MED FILL — OXYBUTYNIN CL ER 10 MG TAB: 10 | 30 days supply | Qty: 30 | Fill #0

## 2019-12-27 ENCOUNTER — Other Ambulatory Visit: Payer: Self-pay

## 2019-12-27 ENCOUNTER — Encounter: Payer: Self-pay | Admitting: Physical Therapy

## 2019-12-27 ENCOUNTER — Ambulatory Visit: Payer: Medicaid Other | Attending: Hematology and Oncology | Admitting: Physical Therapy

## 2019-12-27 DIAGNOSIS — C530 Malignant neoplasm of endocervix: Secondary | ICD-10-CM | POA: Diagnosis present

## 2019-12-27 DIAGNOSIS — I890 Lymphedema, not elsewhere classified: Secondary | ICD-10-CM

## 2019-12-27 DIAGNOSIS — R262 Difficulty in walking, not elsewhere classified: Secondary | ICD-10-CM

## 2019-12-27 NOTE — Therapy (Signed)
[place], [place], [address] Phone: [phone]   Fax:  [phone]  Physical Therapy Treatment  Patient Details  Name: Sheena Torres MRN: [id number] Date of Birth: April 19, 1965 Referring Provider (PT): Alvy Bimler   Encounter Date: 12/27/2019   PT End of Session - 12/27/19 1553    Visit Number 8    Number of Visits 26    Date for PT Re-Evaluation 02/07/20    Authorization - Visit Number 8    Authorization - Number of Visits 27    PT Start Time 1505    PT Stop Time 6962    PT Time Calculation (min) 43 min    Activity Tolerance Patient tolerated treatment well    Behavior During Therapy [facility] for tasks assessed/performed           Past Medical History:  Diagnosis Date  . ADHD   . Anemia   . Bilateral edema of lower extremity    08-23-2019  per pt wears compression hose lower belly right foot ankle and thigh, some left le swelling  . Bilateral ureteral obstruction    urologist--- dr winter  . Cervical cancer [facility]) oncologist-- dr gorsuch/  dr Sondra Come   Stage IIB, SCC--- chemo 05-14-2018 to 06-26-2018 and started chemo 05-28-2019 weekly;  completed IMRT w/ last 5 at high dose 05-14-2018  to 07-27-2018  . Chemotherapy-induced nausea    08-23-2019  per pt intermittant  . Chronic cystitis   . Chronic pain    due to chemo  . Complication of anesthesia     extreme pain after stent july 2021  . Fatigue   . Hematuria    08-23-2019  per pt intermittant  . IBS (irritable bowel syndrome)    w/ diarrhea  . Migraine    none recent  . Nocturia more than twice per night   . Pancytopenia, acquired ([place])   . Port-A-Cath in place 05/08/2018  . Scratches    back due to itching at bra line uses neosporin, healing well  . Sensation of pressure in bladder area    occasional due to radiation therapy  . Urgency of urination    08-23-2019 mostly during the day  . Urinary incontinence    lekage    Past  Surgical History:  Procedure Laterality Date  . BREAST ENHANCEMENT SURGERY Bilateral 2017   w/ implants  . CESAREAN SECTION  2000  . CYSTOSCOPY W/ URETERAL STENT PLACEMENT Bilateral 12/01/2019   Procedure: CYSTOSCOPY WITH RETROGRADE PYELOGRAM/URETERAL STENT PLACEMENT;  Surgeon: Ceasar Mons, MD;  Location: [facility];  Service: Urology;  Laterality: Bilateral;  . CYSTOSCOPY WITH STENT PLACEMENT Bilateral 05/26/2019   Procedure: CYSTOSCOPY WITH BILATERAL  STENT PLACEMENT;  Surgeon: Ceasar Mons, MD;  Location: [facility];  Service: Urology;  Laterality: Bilateral;  . CYSTOSCOPY WITH STENT PLACEMENT Bilateral 09/01/2019   Procedure: CYSTOSCOPY WITH STENT EXCHANGE/ RETROGRADE;  Surgeon: Ceasar Mons, MD;  Location: [facility];  Service: Urology;  Laterality: Bilateral;  ONLY NEEDS 30 MIN  . IR IMAGING GUIDED PORT INSERTION  05/08/2018  . IR REMOVAL TUN ACCESS W/ PORT W/O FL MOD SED  11/16/2018  . OPERATIVE ULTRASOUND N/A 07/02/2018   Procedure: OPERATIVE ULTRASOUND;  Surgeon: Gery Pray, MD;  Location: [facility];  Service: Urology;  Laterality: N/A;  . OPERATIVE ULTRASOUND N/A 07/09/2018   Procedure: OPERATIVE ULTRASOUND;  Surgeon: Gery Pray, MD;  Location: [facility];  Service: Urology;  Laterality: N/A;  . OPERATIVE ULTRASOUND N/A 07/13/2018   Procedure: OPERATIVE ULTRASOUND;  Surgeon: Gery Pray, MD;  Location: [facility];  Service: Urology;  Laterality: N/A;  . OPERATIVE ULTRASOUND N/A 07/21/2018   Procedure: OPERATIVE ULTRASOUND;  Surgeon: Gery Pray, MD;  Location: [facility];  Service: Urology;  Laterality: N/A;  . OPERATIVE ULTRASOUND N/A 07/27/2018   Procedure: OPERATIVE ULTRASOUND;  Surgeon: Gery Pray, MD;  Location: [facility];  Service: Urology;  Laterality: N/A;  . TANDEM RING INSERTION N/A 07/02/2018   Procedure: TANDEM RING  INSERTION;  Surgeon: Gery Pray, MD;  Location: [facility];  Service: Urology;  Laterality: N/A;  . TANDEM RING INSERTION N/A 07/09/2018   Procedure: TANDEM RING INSERTION;  Surgeon: Gery Pray, MD;  Location: [facility];  Service: Urology;  Laterality: N/A;  . TANDEM RING INSERTION N/A 07/13/2018   Procedure: TANDEM RING INSERTION;  Surgeon: Gery Pray, MD;  Location: [facility];  Service: Urology;  Laterality: N/A;  . TANDEM RING INSERTION N/A 07/21/2018   Procedure: TANDEM RING INSERTION;  Surgeon: Gery Pray, MD;  Location: [facility];  Service: Urology;  Laterality: N/A;  . TANDEM RING INSERTION N/A 07/27/2018   Procedure: TANDEM RING INSERTION;  Surgeon: Gery Pray, MD;  Location: [place];  Service: Urology;  Laterality: N/A;    There were no vitals filed for this visit.   Subjective Assessment - 12/27/19 1520    Subjective My left leg is now swelling as bad as the right leg. My abdomen is still swelling. I would like you to message Dr. Alvy Bimler before we do anything to make sure it is ok.    Pertinent History cervical cancer, stage IIB, radiation 05/14/18-06/24/18, 04/16/18 cervical cone biopsy, Sept 2020 was cancer free, Feb 2021- recurrence - tumour on L side of uterus, another tumor between kidney and bladder, pt recently stopped chemo and switched to immunotherapy,    Patient Stated Goals to get the swelling in the right leg down    Currently in Pain? Yes    Pain Score 5     Pain Location Abdomen    Pain Orientation Medial    Pain Descriptors / Indicators Heaviness    Pain Type Chronic pain    Pain Onset More than a month ago    Pain Frequency Constant                 LYMPHEDEMA/ONCOLOGY QUESTIONNAIRE - 12/27/19 0001      Right Lower Extremity Lymphedema   20 cm Proximal to Suprapatella 61.2 cm    10 cm Proximal to Suprapatella 55 cm    At Midpatella/Popliteal Crease 42.7 cm    30  cm Proximal to Floor at Lateral Plantar Foot 41.5 cm    20 cm Proximal to Floor at Lateral Plantar Foot 33.4 1    10  cm Proximal to Floor at Lateral Malleoli 23.2 cm    5 cm Proximal to 1st MTP Joint 21.6 cm    Across MTP Joint 22.5 cm    Around Proximal Great Toe 8 cm      Left Lower Extremity Lymphedema   20 cm Proximal to Suprapatella 57.5 cm    10 cm Proximal to Suprapatella 51 cm    At Midpatella/Popliteal Crease 42.4 cm    30 cm Proximal to Floor at Lateral Plantar Foot 41 cm    20 cm Proximal to Floor at Lateral Plantar Foot  31.3 cm    10 cm Proximal to Floor at Lateral Malleoli 23 cm    5 cm Proximal to 1st MTP Joint 22.6 cm    Across MTP Joint 23.1 cm    Around Proximal Great Toe 7.6 cm                Outpatient [facility]  Lymphedema Life Impact Scale Total Score 39.71 %                         PT Long Term Goals - 10/26/19 1215      PT LONG TERM GOAL #1   Title Pt will demonstrate a 3 cm reduction in edema at 20 cm proximal to floor at lateral plantar foot to decrease risk of cellulitis.    Baseline R 32 cm    Time 5    Period Weeks    Status New    Target Date 11/30/19      PT LONG TERM GOAL #2   Title Pt will demonstrate a 5 cm decrease in edema at 20 cm proximal to suprapatella to decrease risk of infection.    Baseline R 59 cm    Time 5    Period Weeks    Status New    Target Date 11/30/19      PT LONG TERM GOAL #3   Title Pt will be independent in management of lymphedema through self MLD and compression garments.    Time 5    Period Weeks    Status New    Target Date 11/30/19      PT LONG TERM GOAL #4   Title Pt would benefit from appropriate day and nightitme compression garments for long term management of lymphedema.    Time 5    Period Weeks    Status New    Target Date 11/30/19      PT LONG TERM GOAL #5   Title Pt would benefit from a trial of FlexiTouch  compression pump for long term management of lymphedema.    Time 5    Period Weeks    Status New    Target Date 11/30/19                 Plan - 12/27/19 1556    Clinical Impression Statement Pt returns for reeval today after having stents replaced in kidneys. She was last seen at the end of September. She recently had a urinary tract infection. Since pt was last seen she has developed swelling in her left leg in addition to the RLE swelling she had previously and abdominal swelling. Pt reports that her urologist educated her that it was fine to proceed with the compression pump because her kidneys and heart are working "ok". I educated pt to hold off on the compression pump until clearence is received from her oncologist. Therapist sent message to pt's oncologist about recent status change in patient and how to proceed and if bandaging is safe. If clearance is received then pt would benefit from compression bandaging (starting minimally at first to monitor reaction), MLD and compression garments (current garments no longer fit due to swelling).    Personal Factors and Comorbidities Comorbidity 1    Comorbidities bilateral kidney stents, pancytopenia    Rehab Potential Good    PT Frequency 3x / week    PT Duration 6 weeks    PT Treatment/Interventions ADLs/Self Care Home  Management;Therapeutic exercise;Therapeutic activities;Patient/family education;Manual techniques;Manual lymph drainage;Compression bandaging;Taping;Passive range of motion;Vasopneumatic Device    PT Next Visit Plan see if clearance was received from oncologist, begin MLD to bialteral LE and only begin applying bandages to knee on 1 leg to assess    PT Home Exercise Plan Self MLD;    Consulted and Agree with Plan of Care Patient;Family member/caregiver    Family Member Consulted son, Grayce Sessions           Patient will benefit from skilled therapeutic intervention in order to improve the following deficits and impairments:      Visit Diagnosis: Lymphedema, not elsewhere classified  Difficulty in walking, not elsewhere classified  Malignant neoplasm of endocervix [facility])     Problem List Patient Active Problem List   Diagnosis Date Noted  . Superficial bruising 12/15/2019  . Nausea without vomiting 12/09/2019  . Preventive measure 10/28/2019  . UTI (urinary tract infection) 07/30/2019  . Edema of right lower extremity 06/18/2019  . Anemia due to antineoplastic chemotherapy 06/18/2019  . Mucositis due to antineoplastic therapy 06/18/2019  . Financial difficulties 05/21/2019  . Bilateral hydronephrosis 05/18/2019  . Peripheral neuropathy due to chemotherapy ([place]) 09/25/2018  . Pancytopenia, acquired ([place]) 06/05/2018  . Dysuria 06/04/2018  . Cancer associated pain 05/01/2018  . Other constipation 05/01/2018  . Physical debility 05/01/2018  . Goals of care, counseling/discussion 05/01/2018  . 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 ADHD (attention deficit hyperactivity disorder), inattentive type 10/11/2015    Allyson Sabal [facility] 12/27/2019, 5:12 PM  [place], [place], [address] Phone: [phone]   Fax:  [phone]  Name: Sheena Torres MRN: [id number] Date of Birth: Mar 14, 1965  Manus Gunning, PT 12/27/19 5:12 PM

## 2019-12-28 ENCOUNTER — Telehealth: Payer: Self-pay | Admitting: Oncology

## 2019-12-28 ENCOUNTER — Other Ambulatory Visit: Payer: Self-pay | Admitting: Hematology and Oncology

## 2019-12-28 ENCOUNTER — Other Ambulatory Visit (HOSPITAL_COMMUNITY): Payer: Self-pay | Admitting: Hematology and Oncology

## 2019-12-28 MED ORDER — OXYCODONE HCL 10 MG PO TABS: 10.0000 mg | ORAL_TABLET | ORAL | 0 refills | Status: DC | PRN

## 2019-12-28 MED FILL — oxyCODONE HCL 10 MG TABS: 10 | 10 days supply | Qty: 60 | Fill #0

## 2019-12-28 NOTE — Telephone Encounter (Signed)
done

## 2019-12-28 NOTE — Telephone Encounter (Signed)
Sheena Torres called and asked for a refill of oxycodone 10 mg to be sent to [facility].  She has 2 tablets left.

## 2019-12-28 NOTE — Telephone Encounter (Signed)
Called Travonna back and let her know the refill has been sent.

## 2019-12-30 ENCOUNTER — Other Ambulatory Visit: Payer: Self-pay

## 2019-12-30 ENCOUNTER — Encounter: Payer: Self-pay | Admitting: Hematology and Oncology

## 2019-12-30 ENCOUNTER — Inpatient Hospital Stay (HOSPITAL_BASED_OUTPATIENT_CLINIC_OR_DEPARTMENT_OTHER): Payer: Medicaid Other | Admitting: Hematology and Oncology

## 2019-12-30 ENCOUNTER — Inpatient Hospital Stay: Payer: Medicaid Other

## 2019-12-30 VITALS — BP 106/74 | HR 129 | Temp 99.0°F | Resp 18 | Ht 62.0 in | Wt 149.4 lb

## 2019-12-30 VITALS — HR 123

## 2019-12-30 DIAGNOSIS — K5909 Other constipation: Secondary | ICD-10-CM

## 2019-12-30 DIAGNOSIS — R600 Localized edema: Secondary | ICD-10-CM

## 2019-12-30 DIAGNOSIS — G893 Neoplasm related pain (acute) (chronic): Secondary | ICD-10-CM

## 2019-12-30 DIAGNOSIS — C530 Malignant neoplasm of endocervix: Secondary | ICD-10-CM | POA: Diagnosis not present

## 2019-12-30 DIAGNOSIS — Z5112 Encounter for antineoplastic immunotherapy: Secondary | ICD-10-CM | POA: Diagnosis not present

## 2019-12-30 DIAGNOSIS — T451X5A Adverse effect of antineoplastic and immunosuppressive drugs, initial encounter: Secondary | ICD-10-CM

## 2019-12-30 DIAGNOSIS — D6481 Anemia due to antineoplastic chemotherapy: Secondary | ICD-10-CM | POA: Diagnosis not present

## 2019-12-30 DIAGNOSIS — Z7189 Other specified counseling: Secondary | ICD-10-CM

## 2019-12-30 LAB — CMP (CANCER CENTER ONLY)
ALT: 6 U/L (ref 0–44)
AST: 14 U/L — ABNORMAL LOW (ref 15–41)
Albumin: 2.6 g/dL — ABNORMAL LOW (ref 3.5–5.0)
Alkaline Phosphatase: 88 U/L (ref 38–126)
Anion gap: 9 (ref 5–15)
BUN: 18 mg/dL (ref 6–20)
CO2: 25 mmol/L (ref 22–32)
Calcium: 9.3 mg/dL (ref 8.9–10.3)
Chloride: 97 mmol/L — ABNORMAL LOW (ref 98–111)
Creatinine: 1.2 mg/dL — ABNORMAL HIGH (ref 0.44–1.00)
GFR, Estimated: 54 mL/min — ABNORMAL LOW (ref >60)
Glucose, Bld: 110 mg/dL — ABNORMAL HIGH (ref 70–99)
Potassium: 4.1 mmol/L (ref 3.5–5.1)
Sodium: 131 mmol/L — ABNORMAL LOW (ref 135–145)
Total Bilirubin: 0.3 mg/dL (ref 0.3–1.2)
Total Protein: 6 g/dL — ABNORMAL LOW (ref 6.5–8.1)

## 2019-12-30 LAB — CBC WITH DIFFERENTIAL (CANCER CENTER ONLY)
Abs Immature Granulocytes: 0.04 10*3/uL (ref 0.00–0.07)
Basophils Absolute: 0 10*3/uL (ref 0.0–0.1)
Basophils Relative: 0 %
Eosinophils Absolute: 0.3 10*3/uL (ref 0.0–0.5)
Eosinophils Relative: 3 %
HCT: 27.3 % — ABNORMAL LOW (ref 36.0–46.0)
Hemoglobin: 8.5 g/dL — ABNORMAL LOW (ref 12.0–15.0)
Immature Granulocytes: 0 %
Lymphocytes Relative: 17 %
Lymphs Abs: 1.7 10*3/uL (ref 0.7–4.0)
MCH: 30.8 pg (ref 26.0–34.0)
MCHC: 31.1 g/dL (ref 30.0–36.0)
MCV: 98.9 fL (ref 80.0–100.0)
Monocytes Absolute: 0.8 10*3/uL (ref 0.1–1.0)
Monocytes Relative: 8 %
Neutro Abs: 7 10*3/uL (ref 1.7–7.7)
Neutrophils Relative %: 72 %
Platelet Count: 324 10*3/uL (ref 150–400)
RBC: 2.76 MIL/uL — ABNORMAL LOW (ref 3.87–5.11)
RDW: 14.5 % (ref 11.5–15.5)
WBC Count: 9.8 10*3/uL (ref 4.0–10.5)
nRBC: 0 % (ref 0.0–0.2)

## 2019-12-30 LAB — TSH: TSH: 3.034 u[IU]/mL (ref 0.308–3.960)

## 2019-12-30 MED ORDER — SODIUM CHLORIDE 0.9 % IV SOLN
Freq: Once | INTRAVENOUS | Status: AC
  Filled 2019-12-30: qty 250

## 2019-12-30 MED ORDER — SODIUM CHLORIDE 0.9 % IV SOLN
200.0000 mg | Freq: Once | INTRAVENOUS | Status: AC
  Administered 2019-12-30: 200 mg via INTRAVENOUS
  Filled 2019-12-30: qty 8

## 2019-12-30 NOTE — Assessment & Plan Note (Signed)
She had multiple complications from treatment We will proceed with cycle 4 of treatment today and I plan to repeat imaging study in the next 2 weeks for objective assessment of response to therapy

## 2019-12-30 NOTE — Patient Instructions (Signed)
[facility] Discharge Instructions for Patients Receiving Chemotherapy  Today you received the following chemotherapy agents: pembrolizumab.  To help prevent nausea and vomiting after your treatment, we encourage you to take your nausea medication as directed.   If you develop nausea and vomiting that is not controlled by your nausea medication, call the clinic.   BELOW ARE SYMPTOMS THAT SHOULD BE REPORTED IMMEDIATELY:  *FEVER GREATER THAN 100.5 F  *CHILLS WITH OR WITHOUT FEVER  NAUSEA AND VOMITING THAT IS NOT CONTROLLED WITH YOUR NAUSEA MEDICATION  *UNUSUAL SHORTNESS OF BREATH  *UNUSUAL BRUISING OR BLEEDING  TENDERNESS IN MOUTH AND THROAT WITH OR WITHOUT PRESENCE OF ULCERS  *URINARY PROBLEMS  *BOWEL PROBLEMS  UNUSUAL RASH Items with * indicate a potential emergency and should be followed up as soon as possible.  Feel free to call the clinic should you have any questions or concerns. The clinic phone number is [phone].  Please show the CHEMO ALERT CARD at check-in to the Emergency Department and triage nurse.   

## 2019-12-30 NOTE — Assessment & Plan Note (Signed)
She tolerated the addition of methadone and recent dose adjustment of her pain medication better She will continue prescribed pain medicine

## 2019-12-30 NOTE — Progress Notes (Signed)
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 Her anemia is likely due to chronic kidney disease She is not symptomatic She does not need blood transfusion support  Cancer associated pain She tolerated the addition of methadone and recent dose adjustment of her pain medication better She will continue prescribed pain medicine   Bilateral lower extremity edema This is likely secondary to third spacing from low protein status She had ultrasound venous Doppler which rule out blood clots I recommend elastic compression hose as tolerated and physical therapy  Other constipation We have extensive discussions about the importance of aggressive laxative therapy   Orders Placed This Encounter  Procedures  . CT ABDOMEN PELVIS W CONTRAST    Standing Status:   Future    Standing Expiration Date:   12/29/2020    Order Specific Question:   If indicated for the ordered procedure, I authorize the administration of contrast media per Radiology protocol    Answer:   Yes    Order Specific Question:   Preferred imaging location?    Answer:   [facility]    Order Specific Question:   Radiology Contrast Protocol - do NOT remove file path    Answer:   [email or web address]    Order Specific Question:   Is patient pregnant?    Answer:   No    All questions were answered. The patient knows to call the clinic with any problems, questions or concerns. The total time spent in the appointment  was 30 minutes encounter with patients including review of chart and various tests results, discussions about plan of care and coordination of care plan   Heath Lark, MD 12/30/2019 12:15 PM  INTERVAL HISTORY: Please see below for problem oriented charting. She returns with her son, Arnette Norris for further follow-up She was seen in a local emergency department approximately a week ago because of sensation of shortness of breath and leg swelling CT angiogram rule out PE Ultrasound venous Doppler showed no evidence of blood clots She was found to have bilateral pleural effusion She was subsequently discharged with oral antibiotics for UTI Symptoms is improved She is still bothered by significant leg swelling She is not using elastic compression hose as recommended Her pain is well controlled She denies recent constipation  SUMMARY OF ONCOLOGIC HISTORY: Oncology History Overview Note  PD-L1 5%   Malignant neoplasm of endocervix ([place])  09/15/2017 Initial Diagnosis   The patient reports postcoital bleeding since August, 2019 and postmenopausal bleeding since November, 2019. 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    04/16/2018 Surgery   She was taken to the OR on 04/16/18 for a cervical cone biopsy. The specimen revealed a grossly normal cervix on the ectocervix.  Cone specimen revealed squamous cell carcinoma.  When discussing this with the pathologist he reported that in the actual cone specimen itself that was CIN 3 however there was a separate fragment of tissue that included carcinoma.  The dimensions  of this was 0.6 cm, however this involved the margins.  A post cone ECC was positive for squamous cell carcinoma, as well as an endometrial curettage which also contain benign endometrial glands.    04/16/2018 Pathology Results   Endocervix curettage: [facility]   04/22/2018 Initial Diagnosis   Malignant neoplasm of  endocervix ([place])   04/24/2018 PET scan   1. There is a large mass involving the cervix and endometrium which has a maximum dimension of 5.3 cm within SUV max of 20.4. 2. Small bilateral pelvic sidewall lymph nodes measuring less than 1cm with mild nonspecific uptake. The no hypermetabolic adenopathy or evidence of distant metastatic disease. 3. Nonspecific pulmonary nodules measuring less than 5 mm are identified in the right lung. Too small to characterize by PET-CT.    04/27/2018 Imaging   MRI pelvis  1. Uterine cervix 5.5 x 3.4 x 3.2 cm mass compatible with primary cervical malignancy, with mild parametrial invasion. Stage IIB by MRI. 2. Small volume simple free fluid in the pelvic cul-de-sac. 3. No pathologically enlarged pelvic lymph nodes, see comments.   05/01/2018 Cancer Staging   Staging form: Cervix Uteri, AJCC 8th Edition - Clinical: FIGO Stage IIB (cT2b, cN0, cM0) - Signed by Heath Lark, MD on 05/01/2018   05/08/2018 Procedure   Placement of a subcutaneous port device. Catheter tip at the superior cavoatrial junction.   05/14/2018 - 07/27/2018 Radiation Therapy   Radiation treatment dates:    1. 05/14/2018 - 06/24/2018 2. 5/21, 5/28, 6/1, 6/9, 07/27/2018   Site/dose:    1.         A. pelvsi / 45 Gy in 25 fractions              B. Parametrial  boost / 9 Gy in 5 fractions 2. Cervix, Tandem and Ring System / 27.5 Gy in 5 fractions     05/15/2018 - 06/26/2018 Chemotherapy   The patient had weekly cisplatin   10/27/2018 PET scan   Near complete resolution of FDG uptake at site of previously seen cervical mass. Resolution of previously seen sub-cm pelvic lymph nodes. No evidence of metabolically-active metastatic disease.   05/13/2019 Imaging   1. There is ill-defined, hypoenhancing soft tissue most clearly visualized at the left aspect of the uterus and adnexa, which obstructs the distal left ureter, measuring approximately 4.0 x 3.4 cm. This is concerning for local recurrence of  cervical malignancy.   2. Ill-defined, hypoenhancing appearance of the cervix with loss of distinction of the fat plane to the posterior urinary bladder, in keeping with primary malignancy.   3. Interval enlargement of numerous abnormal retroperitoneal and bilateral inguinal lymph nodes, consistent with nodal metastatic disease.    4. There is new moderate left, mild right hydronephrosis and hydroureter, with soft tissue described above obstructing the distal left ureter in the vicinity of the adnexa. There is high-grade obstruction of the distal left ureter with no excreted contrast on delayed phase imaging. Point of obstruction of the right ureter is not clearly visualized, possibly at the ureterovesicular junction.      Genetic Testing   Patient has genetic testing done for PD-L1. Results revealed patient has the following: PD-L1 staining in tumor cells (TC): 5% PD-L1 staining in tumor associated immune cells (IC): <1% PD-L1 combined positive score (CPS): 5%   05/28/2019 -  Chemotherapy   The patient had carboplatin and taxol for chemotherapy treatment.     07/29/2019 Imaging   1. Mild improvement in mild left paraaortic retroperitoneal  lymphadenopathy. No new or progressive metastatic disease identified. 2. No significant change in size of soft tissue mass in the left adnexa and parametrium. 3. Stable diffuse bladder wall thickening, consistent with cystitis and likely due to previous radiation therapy. 4. Interval placement of bilateral ureteral stents, with resolution of bilateral hydroureteronephrosis since prior study. 5. Stable diffuse left renal parenchymal atrophy. 6. Large stool burden noted; recommend clinical correlation for possible constipation.   09/01/2019 Surgery   Operative Note   Preoperative diagnosis:  1.  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  Same   Procedure(s): 1.  Cystoscopy with bilateral  stent exchange 2.  Bilateral retrograde pyelograms with intraoperative interpretation of fluoroscopic imaging   Surgeon: Ellison Hughs, MD    Specimens: 1.  Previously placed bilateral JJ stents were removed intact, inspected and discarded   Drains/Catheters: 1.  Bilateral 6 French, 24 cm Polaris stents without tethers   Intraoperative findings:   1. Right retrograde pyelogram revealed dilation starting in the mid to proximal aspects of the right ureter, extending up to the right renal pelvis.  No distal filling defects were identified. 2. Left retrograde pyelogram revealed dilation starting in the mid to proximal aspects of the left ureter, extending up to the left renal pelvis.  No distal filling defects were identified   10/14/2019 Imaging   1. Small RIGHT effusion increased from the previous exam with some suggestion of early diaphragmatic nodularity and with soft tissue nodules or small lymph nodes in the cardiophrenic recess, this along with thoracic inlet adenopathy compatible with disease in the chest. 2. Increase in abdominal ascites and subjective increase in fascial thickening in the lower abdomen and pelvis may reflect worsening of disease in this area. Attention on follow-up. 3. Accurate measurements are difficult with respect adnexal structures, increased fullness of the RIGHT adnexa since previous imaging. 4. Retroperitoneal lymph nodes with similar appearance. Stranding about retroperitoneal vessels with some increase. 5. Signs of presumed cystitis with bilateral nephroureteral stents in place not significantly changed from the previous study. 6. Post radiation changes about the rectum. Slightly indistinct appearance of the distal colon along the descending and sigmoid favored to represent increasing serosal involvement in the setting of peritoneal disease. Correlate with any clinical evidence of colitis. 7. Aortic atherosclerosis.   10/28/2019 -  Chemotherapy   The patient  had pembrolizumab for chemotherapy treatment.     12/01/2019 Surgery   Preoperative diagnosis:  1.  Stage IV cervical cancer 2.  Bilateral hydronephrosis   Postoperative diagnosis: 1.  Stage IV cervical cancer 2.  Bilateral hydronephrosis   Procedure(s): 1.  Cystoscopy with bilateral stent exchange 2.  Bilateral retrograde pyelograms with intraoperative interpretation of fluoroscopic imaging   Surgeon: Ellison Hughs, MD Drains/Catheters: 1.  Left 6 French, 22 cm Polaris stent 2.  Right 5 French, 22 cm Polaris stent (a 5 French stent was utilized due to the lack of additional 6 [place] stents)   Intraoperative findings:   1. Left retrograde pyelogram revealed dilation of the left renal pelvis at the level of the UPJ with no additional filling defects seen within the renal pelvis or along the entire length of the left ureter 2. Right retrograde pyelogram revealed ureteral jetting at the level of the UPJ with uniform dilation of the right renal pelvis and its associated calyces.  No additional filling defects were seen within the right renal pelvis or along the length of the right ureter.   12/22/2019 Imaging  CT angiogram at [facility] 1.  No acute pulmonary embolism.  2.  Moderate right and small left pleural effusions with adjacent likely atelectasis.   12/22/2019 Imaging   US venous Doppler at [facility] No evidence of deep venous thrombosis.      REVIEW OF SYSTEMS:   Constitutional: Denies fevers, chills or abnormal weight loss Eyes: Denies blurriness of vision Ears, nose, mouth, throat, and face: Denies mucositis or sore throat Respiratory: Denies cough, dyspnea or wheezes Gastrointestinal:  Denies nausea, heartburn or change in bowel habits Skin: Denies abnormal skin rashes Lymphatics: Denies new lymphadenopathy or easy bruising Neurological:Denies numbness, tingling or new weaknesses Behavioral/Psych: Mood is stable, no new changes  All other systems were reviewed with  the patient and are negative.  I have reviewed the past medical history, past surgical history, social history and family history with the patient and they are unchanged from previous note.  ALLERGIES:  has No Known Allergies.  MEDICATIONS:  Current Outpatient Medications  Medication Sig Dispense Refill  . acetaminophen (TYLENOL) 325 MG tablet Take 650 mg by mouth every 6 (six) hours as needed. 08-23-2019 Pt takes one tablet twice daily with one percocet    . cyclobenzaprine (FLEXERIL) 5 MG tablet Take 1 tablet (5 mg total) by mouth 3 (three) times daily as needed for muscle spasms. 30 tablet 0  . lactulose (CHRONULAC) 10 GM/15ML solution Take 15 mLs (10 g total) by mouth 3 (three) times daily. 473 mL 0  . methadone (DOLOPHINE) 10 MG tablet Take 1 tablet (10 mg total) by mouth every 12 (twelve) hours. 60 tablet 0  . neomycin-bacitracin-polymyxin (NEOSPORIN) ointment Apply 1 application topically every 12 (twelve) hours. To back prn    . ondansetron (ZOFRAN) 8 MG tablet Take 8 mg by mouth every 8 (eight) hours as needed.    Marland Kitchen oxybutynin (DITROPAN) 5 MG tablet Take 1 tablet (5 mg total) by mouth every 8 (eight) hours as needed for bladder spasms. (Patient taking differently: Take 5 mg by mouth 3 (three) times daily. ) 30 tablet 1  . Oxycodone HCl 10 MG TABS Take 1 tablet (10 mg total) by mouth every 4 (four) hours as needed. 60 tablet 0  . prochlorperazine (COMPAZINE) 10 MG tablet Take 1 tablet (10 mg total) by mouth every 6 (six) hours as needed (Nausea or vomiting). 30 tablet 1  . SUMAtriptan (IMITREX) 100 MG tablet Take 100 mg by mouth every 2 (two) hours as needed for migraine. May repeat in 2 hours if headache persists or recurs.    . tamsulosin (FLOMAX) 0.4 MG CAPS capsule Take 0.4 mg by mouth 2 (two) times daily.     No current facility-administered medications for this visit.    PHYSICAL EXAMINATION: ECOG PERFORMANCE STATUS: 2 - Symptomatic, <50% confined to bed  Vitals:   12/30/19  1008  BP: 106/74  Pulse: (!) 129  Resp: 18  Temp: 99 F (37.2 C)  SpO2: 92%   Filed Weights   12/30/19 1008  Weight: 149 lb 6.4 oz (67.8 kg)    GENERAL:alert, no distress and comfortable HEART: She has profound moderate bilateral lower extremity edema  NEURO: alert & oriented x 3 with fluent speech, no focal motor/sensory deficits  LABORATORY DATA:  I have reviewed the data as listed    Component Value Date/Time   NA 131 (L) 12/30/2019 0947   K 4.1 12/30/2019 0947   CL 97 (L) 12/30/2019 0947   CO2 25 12/30/2019 0947   GLUCOSE 110 (H) 12/30/2019  0947   BUN 18 12/30/2019 0947   CREATININE 1.20 (H) 12/30/2019 0947   CALCIUM 9.3 12/30/2019 0947   PROT 6.0 (L) 12/30/2019 0947   ALBUMIN 2.6 (L) 12/30/2019 0947   AST 14 (L) 12/30/2019 0947   ALT 6 12/30/2019 0947   ALKPHOS 88 12/30/2019 0947   BILITOT 0.3 12/30/2019 0947   GFRNONAA 54 (L) 12/30/2019 0947   GFRAA >60 11/11/2019 1109    No results found for: SPEP, UPEP  Lab Results  Component Value Date   WBC 9.8 12/30/2019   NEUTROABS 7.0 12/30/2019   HGB 8.5 (L) 12/30/2019   HCT 27.3 (L) 12/30/2019   MCV 98.9 12/30/2019   PLT 324 12/30/2019      Chemistry      Component Value Date/Time   NA 131 (L) 12/30/2019 0947   K 4.1 12/30/2019 0947   CL 97 (L) 12/30/2019 0947   CO2 25 12/30/2019 0947   BUN 18 12/30/2019 0947   CREATININE 1.20 (H) 12/30/2019 0947      Component Value Date/Time   CALCIUM 9.3 12/30/2019 0947   ALKPHOS 88 12/30/2019 0947   AST 14 (L) 12/30/2019 0947   ALT 6 12/30/2019 0947   BILITOT 0.3 12/30/2019 0947

## 2019-12-30 NOTE — Assessment & Plan Note (Signed)
We have extensive discussions about the importance of aggressive laxative therapy 

## 2019-12-30 NOTE — Assessment & Plan Note (Signed)
This is likely secondary to third spacing from low protein status She had ultrasound venous Doppler which rule out blood clots I recommend elastic compression hose as tolerated and physical therapy

## 2019-12-30 NOTE — Progress Notes (Signed)
Per Dr. Gorsuch, ok to treat with elevated HR.  

## 2019-12-30 NOTE — Assessment & Plan Note (Signed)
She has multifactorial anemia Her anemia is likely due to chronic kidney disease She is not symptomatic She does not need blood transfusion support

## 2019-12-31 ENCOUNTER — Telehealth: Payer: Self-pay

## 2019-12-31 LAB — T4: T4, Total: 8.6 ug/dL (ref 4.5–12.0)

## 2019-12-31 NOTE — Telephone Encounter (Signed)
Called and given below message. She is agreeable to appt date/ time. Appt scheduled.

## 2019-12-31 NOTE — Telephone Encounter (Signed)
-----   Message from Heath Lark, MD sent at 12/31/2019  7:53 AM EST ----- Regarding: appt Can you schedule follow-up 12/1 from 1115 to 12? 45 mins to review CT result

## 2020-01-04 ENCOUNTER — Encounter: Payer: Medicaid Other | Admitting: Physical Therapy

## 2020-01-08 MED FILL — TAMSULOSIN HCL 0.4 MG CAP: 0.4 | 30 days supply | Qty: 60 | Fill #2

## 2020-01-10 ENCOUNTER — Telehealth: Payer: Self-pay | Admitting: Oncology

## 2020-01-10 ENCOUNTER — Other Ambulatory Visit (HOSPITAL_COMMUNITY): Payer: Self-pay | Admitting: Hematology and Oncology

## 2020-01-10 ENCOUNTER — Ambulatory Visit: Payer: Medicaid Other | Admitting: Physical Therapy

## 2020-01-10 ENCOUNTER — Other Ambulatory Visit: Payer: Self-pay | Admitting: Hematology and Oncology

## 2020-01-10 MED ORDER — OXYCODONE HCL 10 MG PO TABS: 10.0000 mg | ORAL_TABLET | ORAL | 0 refills | Status: AC | PRN

## 2020-01-10 MED FILL — oxyCODONE HCL 10 MG TABS: 10 | 10 days supply | Qty: 60 | Fill #0

## 2020-01-10 NOTE — Telephone Encounter (Signed)
done

## 2020-01-10 NOTE — Telephone Encounter (Signed)
Called Sheena Torres and let her know the refill has been sent.

## 2020-01-10 NOTE — Telephone Encounter (Signed)
Sheena Torres called and requested a refill of oxycodone to be sent to [facility].

## 2020-01-11 ENCOUNTER — Encounter (HOSPITAL_COMMUNITY): Payer: Self-pay

## 2020-01-11 ENCOUNTER — Other Ambulatory Visit: Payer: Self-pay

## 2020-01-11 ENCOUNTER — Ambulatory Visit (HOSPITAL_COMMUNITY)
Admission: RE | Admit: 2020-01-11 | Discharge: 2020-01-11 | Disposition: A | Discharge status: Home / Self Care | Payer: Medicaid Other | Source: Ambulatory Visit | Attending: Hematology and Oncology | Admitting: Hematology and Oncology

## 2020-01-11 DIAGNOSIS — C530 Malignant neoplasm of endocervix: Secondary | ICD-10-CM

## 2020-01-11 MED ORDER — IOHEXOL 300 MG/ML  SOLN
100.0000 mL | Freq: Once | INTRAMUSCULAR | Status: AC | PRN
  Administered 2020-01-11: 100 mL via INTRAVENOUS

## 2020-01-12 ENCOUNTER — Inpatient Hospital Stay: Payer: Medicaid Other | Attending: Hematology and Oncology | Admitting: Hematology and Oncology

## 2020-01-12 ENCOUNTER — Telehealth: Payer: Self-pay

## 2020-01-12 ENCOUNTER — Encounter: Payer: Self-pay | Admitting: Hematology and Oncology

## 2020-01-12 ENCOUNTER — Inpatient Hospital Stay (HOSPITAL_BASED_OUTPATIENT_CLINIC_OR_DEPARTMENT_OTHER): Payer: Medicaid Other | Admitting: Medical

## 2020-01-12 ENCOUNTER — Other Ambulatory Visit: Payer: Self-pay | Admitting: Medical

## 2020-01-12 VITALS — BP 104/69 | HR 130 | Temp 98.4°F | Resp 18 | Ht 62.0 in | Wt 150.4 lb

## 2020-01-12 DIAGNOSIS — Z7189 Other specified counseling: Secondary | ICD-10-CM | POA: Diagnosis not present

## 2020-01-12 DIAGNOSIS — E440 Moderate protein-calorie malnutrition: Secondary | ICD-10-CM | POA: Diagnosis not present

## 2020-01-12 DIAGNOSIS — N131 Hydronephrosis with ureteral stricture, not elsewhere classified: Secondary | ICD-10-CM | POA: Diagnosis not present

## 2020-01-12 DIAGNOSIS — N1330 Unspecified hydronephrosis: Secondary | ICD-10-CM

## 2020-01-12 DIAGNOSIS — Z01812 Encounter for preprocedural laboratory examination: Secondary | ICD-10-CM | POA: Insufficient documentation

## 2020-01-12 DIAGNOSIS — R059 Cough, unspecified: Secondary | ICD-10-CM

## 2020-01-12 DIAGNOSIS — Z23 Encounter for immunization: Secondary | ICD-10-CM | POA: Insufficient documentation

## 2020-01-12 DIAGNOSIS — C530 Malignant neoplasm of endocervix: Secondary | ICD-10-CM | POA: Diagnosis not present

## 2020-01-12 DIAGNOSIS — J90 Pleural effusion, not elsewhere classified: Secondary | ICD-10-CM | POA: Diagnosis not present

## 2020-01-12 DIAGNOSIS — R600 Localized edema: Secondary | ICD-10-CM | POA: Insufficient documentation

## 2020-01-12 DIAGNOSIS — Z20822 Contact with and (suspected) exposure to covid-19: Secondary | ICD-10-CM | POA: Insufficient documentation

## 2020-01-12 LAB — SARS CORONAVIRUS 2 (TAT 6-24 HRS): SARS Coronavirus 2: NEGATIVE

## 2020-01-12 MED ORDER — PROCHLORPERAZINE MALEATE 10 MG PO TABS: 10.0000 mg | ORAL_TABLET | Freq: Four times a day (QID) | ORAL | 1 refills | Status: AC | PRN

## 2020-01-12 MED FILL — PROCHLORPERAZINE 10 MG TAB: 10 | 22 days supply | Qty: 90 | Fill #0

## 2020-01-12 NOTE — Telephone Encounter (Signed)
Called per Dr. Alvy Bimler, scheduled lab and appt with Dr. Alvy Bimler for tomorrow. Son is aware of appt time.

## 2020-01-12 NOTE — Assessment & Plan Note (Signed)
This is due to her progressive malignancy The plan would be to treat her condition I recommend also protein rich diet

## 2020-01-12 NOTE — Assessment & Plan Note (Signed)
She is symptomatic with shortness of breath with minimal exertion I recommend urgent thoracentesis on the right

## 2020-01-12 NOTE — Assessment & Plan Note (Signed)
She has new onset bilateral hydronephrosis despite placement of stent I suspect new obstruction has developed I will attempt to call her urologist for urgent evaluation and management

## 2020-01-12 NOTE — Progress Notes (Signed)
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[facility]) Unfortunately, CT imaging show rapid disease progression compared to her last scan Pembrolizumab is not working I am concerned about new bilateral hydronephrosis as well as significant right-sided pleural effusion I will contact her urologist for urgent evaluation and possible stent exchange for treatment of her hydronephrosis I will also order urgent thoracentesis to alleviate her symptoms of shortness of breath We reviewed the guidelines We discussed the risk and benefits of treatment with carboplatin, paclitaxel, combination of carboplatin and Taxol, topotecan or topotecan with Taxol Ultimately, the patient is undecided She gave me permission to call her son in a few days after she made up her mind how to proceed  Bilateral hydronephrosis She has new onset bilateral hydronephrosis despite placement of stent I suspect new obstruction has developed I will attempt to call her urologist for urgent evaluation and management  Pleural effusion on right She is symptomatic with shortness of breath with minimal exertion I recommend urgent thoracentesis on the right  Goals of care, counseling/discussion We have a long discussion about goals of care Her performance status is deteriorated since last time I saw her I am concerned that combination chemotherapy might be too much She is aware that treatment is palliative  Protein-calorie malnutrition, moderate ([place]) This is due to her progressive malignancy The plan would be to treat her condition I recommend also protein rich diet   Orders Placed This Encounter  Procedures  . US THORACENTESIS ASP PLEURAL SPACE W/IMG GUIDE    Standing Status:   Future    Standing Expiration Date:   01/11/2021     Order Specific Question:   Are labs required for specimen collection?    Answer:   No    Order Specific Question:   Reason for Exam (SYMPTOM  OR DIAGNOSIS REQUIRED)    Answer:   right pleural effusion    Order Specific Question:   Preferred imaging location?    Answer:   [facility]    All questions were answered. The patient knows to call the clinic with any problems, questions or concerns. The total time spent in the appointment was 40 minutes encounter with patients including review of chart and various tests results, discussions about plan of care and coordination of care plan   Heath Lark, MD 01/12/2020 3:30 PM  INTERVAL HISTORY: Please see below for problem oriented charting. She returns with her 2 sons for further follow-up She felt weaker since last time I saw her She has more diffuse bilateral lower extremity edema and minimal shortness of breath on exertion She have difficulties eating Denies recent cough Her pain is well controlled She complained recent bloating and some constipation  SUMMARY OF ONCOLOGIC HISTORY: Oncology History Overview Note  PD-L1 5%   Malignant neoplasm of endocervix ([place])  09/15/2017 Initial Diagnosis   The patient reports postcoital bleeding since August, 2019 and postmenopausal bleeding since November, 2019. 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    04/16/2018 Surgery   She was taken to the OR on 04/16/18 for a cervical cone biopsy. The specimen revealed a grossly normal cervix on the ectocervix.  Cone specimen revealed squamous cell carcinoma.  When discussing this with  the pathologist he reported that in the actual cone specimen itself that was CIN 3 however there was a separate fragment of tissue that included carcinoma.  The dimensions of this was 0.6 cm, however this involved the margins.  A post cone ECC was positive for squamous cell carcinoma, as well  as an endometrial curettage which also contain benign endometrial glands.    04/16/2018 Pathology Results   Endocervix curettage: [facility]   04/22/2018 Initial Diagnosis   Malignant neoplasm of endocervix ([place])   04/24/2018 PET scan   1. There is a large mass involving the cervix and endometrium which has a maximum dimension of 5.3 cm within SUV max of 20.4. 2. Small bilateral pelvic sidewall lymph nodes measuring less than 1cm with mild nonspecific uptake. The no hypermetabolic adenopathy or evidence of distant metastatic disease. 3. Nonspecific pulmonary nodules measuring less than 5 mm are identified in the right lung. Too small to characterize by PET-CT.    04/27/2018 Imaging   MRI pelvis  1. Uterine cervix 5.5 x 3.4 x 3.2 cm mass compatible with primary cervical malignancy, with mild parametrial invasion. Stage IIB by MRI. 2. Small volume simple free fluid in the pelvic cul-de-sac. 3. No pathologically enlarged pelvic lymph nodes, see comments.   05/01/2018 Cancer Staging   Staging form: Cervix Uteri, AJCC 8th Edition - Clinical: FIGO Stage IIB (cT2b, cN0, cM0) - Signed by Heath Lark, MD on 05/01/2018   05/08/2018 Procedure   Placement of a subcutaneous port device. Catheter tip at the superior cavoatrial junction.   05/14/2018 - 07/27/2018 Radiation Therapy   Radiation treatment dates:    1. 05/14/2018 - 06/24/2018 2. 5/21, 5/28, 6/1, 6/9, 07/27/2018   Site/dose:    1.         A. pelvsi / 45 Gy in 25 fractions              B. Parametrial  boost / 9 Gy in 5 fractions 2. Cervix, Tandem and Ring System / 27.5 Gy in 5 fractions     05/15/2018 - 06/26/2018 Chemotherapy   The patient had weekly cisplatin   10/27/2018 PET scan   Near complete resolution of FDG uptake at site of previously seen cervical mass. Resolution of previously seen sub-cm pelvic lymph nodes. No evidence of metabolically-active metastatic disease.   05/13/2019 Imaging   1. There is ill-defined, hypoenhancing soft tissue  most clearly visualized at the left aspect of the uterus and adnexa, which obstructs the distal left ureter, measuring approximately 4.0 x 3.4 cm. This is concerning for local recurrence of cervical malignancy.   2. Ill-defined, hypoenhancing appearance of the cervix with loss of distinction of the fat plane to the posterior urinary bladder, in keeping with primary malignancy.   3. Interval enlargement of numerous abnormal retroperitoneal and bilateral inguinal lymph nodes, consistent with nodal metastatic disease.    4. There is new moderate left, mild right hydronephrosis and hydroureter, with soft tissue described above obstructing the distal left ureter in the vicinity of the adnexa. There is high-grade obstruction of the distal left ureter with no excreted contrast on delayed phase imaging. Point of obstruction of the right ureter is not clearly visualized, possibly at the ureterovesicular junction.      Genetic Testing   Patient has genetic testing done for PD-L1. Results revealed patient has the following: PD-L1 staining in tumor cells (TC): 5% PD-L1 staining in tumor associated immune cells (IC): <1% PD-L1 combined positive score (CPS): 5%   05/28/2019 -  Chemotherapy   The patient had carboplatin and taxol for chemotherapy treatment.     07/29/2019 Imaging   1. Mild improvement in mild left paraaortic retroperitoneal lymphadenopathy. No new or progressive metastatic disease identified. 2. No significant change in size of soft tissue mass in the left adnexa and parametrium. 3. Stable diffuse bladder wall thickening, consistent with cystitis and likely due to previous radiation therapy. 4. Interval placement of bilateral ureteral stents, with resolution of bilateral hydroureteronephrosis since prior study. 5. Stable diffuse left renal parenchymal atrophy. 6. Large stool burden noted; recommend clinical correlation for possible constipation.   09/01/2019 Surgery   Operative Note    Preoperative diagnosis:  1.  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  Same   Procedure(s): 1.  Cystoscopy with bilateral stent exchange 2.  Bilateral retrograde pyelograms with intraoperative interpretation of fluoroscopic imaging   Surgeon: Ellison Hughs, MD    Specimens: 1.  Previously placed bilateral JJ stents were removed intact, inspected and discarded   Drains/Catheters: 1.  Bilateral 6 French, 24 cm Polaris stents without tethers   Intraoperative findings:   1. Right retrograde pyelogram revealed dilation starting in the mid to proximal aspects of the right ureter, extending up to the right renal pelvis.  No distal filling defects were identified. 2. Left retrograde pyelogram revealed dilation starting in the mid to proximal aspects of the left ureter, extending up to the left renal pelvis.  No distal filling defects were identified   10/14/2019 Imaging   1. Small RIGHT effusion increased from the previous exam with some suggestion of early diaphragmatic nodularity and with soft tissue nodules or small lymph nodes in the cardiophrenic recess, this along with thoracic inlet adenopathy compatible with disease in the chest. 2. Increase in abdominal ascites and subjective increase in fascial thickening in the lower abdomen and pelvis may reflect worsening of disease in this area. Attention on follow-up. 3. Accurate measurements are difficult with respect adnexal structures, increased fullness of the RIGHT adnexa since previous imaging. 4. Retroperitoneal lymph nodes with similar appearance. Stranding about retroperitoneal vessels with some increase. 5. Signs of presumed cystitis with bilateral nephroureteral stents in place not significantly changed from the previous study. 6. Post radiation changes about the rectum. Slightly indistinct appearance of the distal colon along the descending and sigmoid favored to represent  increasing serosal involvement in the setting of peritoneal disease. Correlate with any clinical evidence of colitis. 7. Aortic atherosclerosis.   10/28/2019 -  Chemotherapy   The patient had pembrolizumab for chemotherapy treatment.     12/01/2019 Surgery   Preoperative diagnosis:  1.  Stage IV cervical cancer 2.  Bilateral hydronephrosis   Postoperative diagnosis: 1.  Stage IV cervical cancer 2.  Bilateral hydronephrosis   Procedure(s): 1.  Cystoscopy with bilateral stent exchange 2.  Bilateral retrograde pyelograms with intraoperative interpretation of fluoroscopic imaging   Surgeon: Ellison Hughs, MD Drains/Catheters: 1.  Left 6 French, 22 cm Polaris stent 2.  Right 5 French, 22 cm Polaris stent (a 5 French stent was utilized due to the lack of additional 6 [place] stents)   Intraoperative findings:   1. Left retrograde pyelogram revealed dilation of the left renal pelvis at the level of the UPJ with no additional filling defects seen within the renal pelvis or along the entire length of the left ureter 2. Right retrograde pyelogram revealed ureteral jetting at the level of the UPJ with uniform dilation of the right renal pelvis  and its associated calyces.  No additional filling defects were seen within the right renal pelvis or along the length of the right ureter.   12/22/2019 Imaging   CT angiogram at [facility] 1.  No acute pulmonary embolism.  2.  Moderate right and small left pleural effusions with adjacent likely atelectasis.   12/22/2019 Imaging   US venous Doppler at [facility] No evidence of deep venous thrombosis.    01/11/2020 Imaging   Mild increase in size of poorly defined mass in the region of the cervix and parametrial soft tissues.   Mild increase in abdominal and pelvic lymphadenopathy, consistent with metastatic disease.   Increased peritoneal metastatic disease along the capsular surface of the liver and right hemidiaphragm. No evidence of ascites.    Increased diffuse mesenteric and body wall edema, without evidence of ascites.   New large bilateral pleural effusions, with compressive atelectasis of both lower lungs.     REVIEW OF SYSTEMS:   Constitutional: Denies fevers, chills or abnormal weight loss Eyes: Denies blurriness of vision Ears, nose, mouth, throat, and face: Denies mucositis or sore throat Cardiovascular: Denies palpitation, chest discomfort  Skin: Denies abnormal skin rashes Lymphatics: Denies new lymphadenopathy or easy bruising Neurological:Denies numbness, tingling or new weaknesses Behavioral/Psych: Mood is stable, no new changes  All other systems were reviewed with the patient and are negative.  I have reviewed the past medical history, past surgical history, social history and family history with the patient and they are unchanged from previous note.  ALLERGIES:  has No Known Allergies.  MEDICATIONS:  Current Outpatient Medications  Medication Sig Dispense Refill  . acetaminophen (TYLENOL) 325 MG tablet Take 650 mg by mouth every 6 (six) hours as needed. 08-23-2019 Pt takes one tablet twice daily with one percocet    . cyclobenzaprine (FLEXERIL) 5 MG tablet Take 1 tablet (5 mg total) by mouth 3 (three) times daily as needed for muscle spasms. 30 tablet 0  . lactulose (CHRONULAC) 10 GM/15ML solution Take 15 mLs (10 g total) by mouth 3 (three) times daily. 473 mL 0  . methadone (DOLOPHINE) 10 MG tablet Take 1 tablet (10 mg total) by mouth every 12 (twelve) hours. 60 tablet 0  . neomycin-bacitracin-polymyxin (NEOSPORIN) ointment Apply 1 application topically every 12 (twelve) hours. To back prn    . ondansetron (ZOFRAN) 8 MG tablet Take 8 mg by mouth every 8 (eight) hours as needed.    Marland Kitchen oxybutynin (DITROPAN) 5 MG tablet Take 1 tablet (5 mg total) by mouth every 8 (eight) hours as needed for bladder spasms. (Patient taking differently: Take 5 mg by mouth 3 (three) times daily. ) 30 tablet 1  . Oxycodone HCl 10  MG TABS Take 1 tablet (10 mg total) by mouth every 4 (four) hours as needed. 60 tablet 0  . prochlorperazine (COMPAZINE) 10 MG tablet Take 1 tablet (10 mg total) by mouth every 6 (six) hours as needed (Nausea or vomiting). 90 tablet 1  . SUMAtriptan (IMITREX) 100 MG tablet Take 100 mg by mouth every 2 (two) hours as needed for migraine. May repeat in 2 hours if headache persists or recurs.    . tamsulosin (FLOMAX) 0.4 MG CAPS capsule Take 0.4 mg by mouth 2 (two) times daily.     No current facility-administered medications for this visit.    PHYSICAL EXAMINATION: ECOG PERFORMANCE STATUS: 2 - Symptomatic, <50% confined to bed  Vitals:   01/12/20 1135  BP: 104/69  Pulse: (!) 130  Resp: 18  Temp: 98.4 F (36.9 C)  SpO2: 92%   Filed Weights   01/12/20 1135  Weight: 150 lb 6.4 oz (68.2 kg)    GENERAL:alert, no distress and comfortable.  She appears weak HEART: Diffuse and generalized lower extremity edema NEURO: alert & oriented x 3 with fluent speech, no focal motor/sensory deficits  LABORATORY DATA:  I have reviewed the data as listed    Component Value Date/Time   NA 131 (L) 12/30/2019 0947   K 4.1 12/30/2019 0947   CL 97 (L) 12/30/2019 0947   CO2 25 12/30/2019 0947   GLUCOSE 110 (H) 12/30/2019 0947   BUN 18 12/30/2019 0947   CREATININE 1.20 (H) 12/30/2019 0947   CALCIUM 9.3 12/30/2019 0947   PROT 6.0 (L) 12/30/2019 0947   ALBUMIN 2.6 (L) 12/30/2019 0947   AST 14 (L) 12/30/2019 0947   ALT 6 12/30/2019 0947   ALKPHOS 88 12/30/2019 0947   BILITOT 0.3 12/30/2019 0947   GFRNONAA 54 (L) 12/30/2019 0947   GFRAA >60 11/11/2019 1109    No results found for: SPEP, UPEP  Lab Results  Component Value Date   WBC 9.8 12/30/2019   NEUTROABS 7.0 12/30/2019   HGB 8.5 (L) 12/30/2019   HCT 27.3 (L) 12/30/2019   MCV 98.9 12/30/2019   PLT 324 12/30/2019      Chemistry      Component Value Date/Time   NA 131 (L) 12/30/2019 0947   K 4.1 12/30/2019 0947   CL 97 (L)  12/30/2019 0947   CO2 25 12/30/2019 0947   BUN 18 12/30/2019 0947   CREATININE 1.20 (H) 12/30/2019 0947      Component Value Date/Time   CALCIUM 9.3 12/30/2019 0947   ALKPHOS 88 12/30/2019 0947   AST 14 (L) 12/30/2019 0947   ALT 6 12/30/2019 0947   BILITOT 0.3 12/30/2019 0947       RADIOGRAPHIC STUDIES: I have reviewed multiple imaging studies with the patient and her sons I have personally reviewed the radiological images as listed and agreed with the findings in the report. CT ABDOMEN PELVIS W CONTRAST  Result Date: 01/11/2020 CLINICAL DATA:  Follow-up cervical carcinoma. Undergoing chemotherapy. Increased shortness of breath. EXAM: CT ABDOMEN AND PELVIS WITH CONTRAST TECHNIQUE: Multidetector CT imaging of the abdomen and pelvis was performed using the standard protocol following bolus administration of intravenous contrast. CONTRAST:  122m OMNIPAQUE IOHEXOL 300 MG/ML  SOLN COMPARISON:  10/14/2019 FINDINGS: Lower Chest: New large bilateral pleural effusions are seen with compressive atelectasis of both lower lungs. Hepatobiliary: No hepatic masses identified. Gallbladder is unremarkable. No evidence of biliary ductal dilatation. Pancreas:  No mass or inflammatory changes. Spleen: Within normal limits in size and appearance. Adrenals/Urinary Tract: No masses identified. Moderate diffuse left renal parenchymal atrophy is stable. Bilateral ureteral stents remain in appropriate position, however moderate severe hydronephrosis is new since prior exam. Stomach/Bowel: No evidence of obstruction, inflammatory process or abnormal fluid collections. Vascular/Lymphatic: Lymphadenopathy in the porta hepatis and celiac axis shows mild increase since previous study, with largest lymph node measuring 1.7 cm on image 37/2, compared to 11 mm previously. Mild lymphadenopathy is seen in the cardiophrenic angles bilaterally which is increased since previous study. Largest lymph node in the right cardiophrenic  angle measures 1.1 cm on image 23/2 compared to 6 mm previously. Mild lymphadenopathy in the left internal iliac chain adjacent to the left ureteral stent is seen, currently measuring 1.4 cm on image 76/2, compared to 0.9 cm previously. No abdominal aortic aneurysm. Reproductive:  Uterine hydrometros is again seen with poorly defined mass in the region of the cervix and involving the parametrial soft tissues. This currently measures 5.8 x 3.7 cm on image 81/2, compared to 5.3 x 3.1 cm previously. Other: Increased soft tissue thickening and nodularity is seen along the capsular surface of the liver in the right hemidiaphragm, consistent with peritoneal metastatic disease. Increased diffuse mesenteric and body wall edema. No evidence of ascites. Musculoskeletal:  No suspicious bone lesions identified. IMPRESSION: Mild increase in size of poorly defined mass in the region of the cervix and parametrial soft tissues. Mild increase in abdominal and pelvic lymphadenopathy, consistent with metastatic disease. Increased peritoneal metastatic disease along the capsular surface of the liver and right hemidiaphragm. No evidence of ascites. Increased diffuse mesenteric and body wall edema, without evidence of ascites. New large bilateral pleural effusions, with compressive atelectasis of both lower lungs. Electronically Signed   By: Marlaine Hind M.D.   On: 01/11/2020 20:58

## 2020-01-12 NOTE — Assessment & Plan Note (Signed)
We have a long discussion about goals of care Her performance status is deteriorated since last time I saw her I am concerned that combination chemotherapy might be too much She is aware that treatment is palliative

## 2020-01-12 NOTE — Assessment & Plan Note (Signed)
Unfortunately, CT imaging show rapid disease progression compared to her last scan Pembrolizumab is not working I am concerned about new bilateral hydronephrosis as well as significant right-sided pleural effusion I will contact her urologist for urgent evaluation and possible stent exchange for treatment of her hydronephrosis I will also order urgent thoracentesis to alleviate her symptoms of shortness of breath We reviewed the guidelines We discussed the risk and benefits of treatment with carboplatin, paclitaxel, combination of carboplatin and Taxol, topotecan or topotecan with Taxol Ultimately, the patient is undecided She gave me permission to call her son in a few days after she made up her mind how to proceed

## 2020-01-13 ENCOUNTER — Telehealth: Payer: Self-pay | Admitting: Oncology

## 2020-01-13 ENCOUNTER — Inpatient Hospital Stay (HOSPITAL_BASED_OUTPATIENT_CLINIC_OR_DEPARTMENT_OTHER): Payer: Medicaid Other | Admitting: Hematology and Oncology

## 2020-01-13 ENCOUNTER — Ambulatory Visit (HOSPITAL_COMMUNITY)
Admission: RE | Admit: 2020-01-13 | Discharge: 2020-01-13 | Disposition: A | Discharge status: Home / Self Care | Payer: Medicaid Other | Source: Ambulatory Visit | Attending: Hematology and Oncology | Admitting: Hematology and Oncology

## 2020-01-13 ENCOUNTER — Other Ambulatory Visit: Payer: Self-pay

## 2020-01-13 ENCOUNTER — Inpatient Hospital Stay: Payer: Medicaid Other

## 2020-01-13 ENCOUNTER — Encounter: Payer: Self-pay | Admitting: Hematology and Oncology

## 2020-01-13 ENCOUNTER — Telehealth: Payer: Self-pay

## 2020-01-13 ENCOUNTER — Ambulatory Visit (HOSPITAL_COMMUNITY)
Admission: RE | Admit: 2020-01-13 | Discharge: 2020-01-13 | Disposition: A | Discharge status: Home / Self Care | Payer: Medicaid Other | Source: Ambulatory Visit | Attending: Radiology | Admitting: Radiology

## 2020-01-13 DIAGNOSIS — C530 Malignant neoplasm of endocervix: Secondary | ICD-10-CM

## 2020-01-13 DIAGNOSIS — N1330 Unspecified hydronephrosis: Secondary | ICD-10-CM

## 2020-01-13 DIAGNOSIS — J90 Pleural effusion, not elsewhere classified: Secondary | ICD-10-CM

## 2020-01-13 DIAGNOSIS — Z7189 Other specified counseling: Secondary | ICD-10-CM

## 2020-01-13 DIAGNOSIS — R600 Localized edema: Secondary | ICD-10-CM

## 2020-01-13 DIAGNOSIS — Z9889 Other specified postprocedural states: Secondary | ICD-10-CM

## 2020-01-13 LAB — CMP (CANCER CENTER ONLY)
ALT: 6 U/L (ref 0–44)
AST: 12 U/L — ABNORMAL LOW (ref 15–41)
Albumin: 2.6 g/dL — ABNORMAL LOW (ref 3.5–5.0)
Alkaline Phosphatase: 110 U/L (ref 38–126)
Anion gap: 11 (ref 5–15)
BUN: 26 mg/dL — ABNORMAL HIGH (ref 6–20)
CO2: 24 mmol/L (ref 22–32)
Calcium: 10.1 mg/dL (ref 8.9–10.3)
Chloride: 96 mmol/L — ABNORMAL LOW (ref 98–111)
Creatinine: 1.86 mg/dL — ABNORMAL HIGH (ref 0.44–1.00)
GFR, Estimated: 32 mL/min — ABNORMAL LOW
Glucose, Bld: 125 mg/dL — ABNORMAL HIGH (ref 70–99)
Potassium: 4.3 mmol/L (ref 3.5–5.1)
Sodium: 131 mmol/L — ABNORMAL LOW (ref 135–145)
Total Bilirubin: 0.3 mg/dL (ref 0.3–1.2)
Total Protein: 6.6 g/dL (ref 6.5–8.1)

## 2020-01-13 LAB — CBC WITH DIFFERENTIAL (CANCER CENTER ONLY)
Abs Immature Granulocytes: 0.02 K/uL (ref 0.00–0.07)
Basophils Absolute: 0 K/uL (ref 0.0–0.1)
Basophils Relative: 0 %
Eosinophils Absolute: 0.1 K/uL (ref 0.0–0.5)
Eosinophils Relative: 1 %
HCT: 31.1 % — ABNORMAL LOW (ref 36.0–46.0)
Hemoglobin: 9.7 g/dL — ABNORMAL LOW (ref 12.0–15.0)
Immature Granulocytes: 0 %
Lymphocytes Relative: 10 %
Lymphs Abs: 0.9 K/uL (ref 0.7–4.0)
MCH: 29.6 pg (ref 26.0–34.0)
MCHC: 31.2 g/dL (ref 30.0–36.0)
MCV: 94.8 fL (ref 80.0–100.0)
Monocytes Absolute: 0.5 K/uL (ref 0.1–1.0)
Monocytes Relative: 6 %
Neutro Abs: 6.9 K/uL (ref 1.7–7.7)
Neutrophils Relative %: 83 %
Platelet Count: 396 K/uL (ref 150–400)
RBC: 3.28 MIL/uL — ABNORMAL LOW (ref 3.87–5.11)
RDW: 14.3 % (ref 11.5–15.5)
WBC Count: 8.4 K/uL (ref 4.0–10.5)
nRBC: 0 % (ref 0.0–0.2)

## 2020-01-13 LAB — TSH: TSH: 1.66 u[IU]/mL (ref 0.308–3.960)

## 2020-01-13 MED ORDER — LIDOCAINE HCL 1 % IJ SOLN
INTRAMUSCULAR | Status: AC
  Filled 2020-01-13: qty 20

## 2020-01-13 NOTE — Procedures (Signed)
Ultrasound-guided therapeutic right thoracentesis performed yielding 1.1 liters of yellow fluid. No immediate complications. Follow-up chest x-ray pending. EBL none. Due to pt coughing/chest discomfort only the above amount of fluid was removed today.

## 2020-01-13 NOTE — Progress Notes (Signed)
Ms. Pieratt was seen for COVID-19 testing prior to planned thoracentesis.  A sample was collected and was submitted for testing.  Sandi Mealy, MHS, PA-C Physician Assistant

## 2020-01-13 NOTE — Assessment & Plan Note (Signed)
This is likely secondary to third spacing from low protein status

## 2020-01-13 NOTE — Assessment & Plan Note (Signed)
We have further discussions about goals of care and her poor prognosis given her recent CT imaging studies and hydronephrosis The patient was very upset and request second opinion Her oldest son, Arnette Norris wants to know estimated life expectancy but the patient's second son and herself would not want to know We did not discuss prognosis today She told me she will keep fighting She is inquired about kidney transplant and I told her she is not a candidate given her malignancy

## 2020-01-13 NOTE — Progress Notes (Signed)
[place] OFFICE PROGRESS NOTE  Patient Care Team: System, Provider Not In as PCP - General Jacqulyn Liner, RN as Oncology Nurse Navigator (Oncology)  ASSESSMENT & PLAN:  Malignant neoplasm of endocervix [facility]) She is seen back urgently today for further discussion about management of hydronephrosis Unfortunately, her renal function is worse We discussed the risk and benefits of percutaneous nephrostomy tubes placement The patient was very upset at the suggestion that she may die quickly due to poor prognosis She requests a second opinion I will refer her back to GYN surgeon for further discussion about plan of care I will see her back next week for further follow-up  Bilateral hydronephrosis We have extensive discussions about risk and benefits of percutaneous nephrostomy tube placement The patient is very upset with her urologist and requests a second opinion We will try to refer her to a different urology practice for further management  Bilateral lower extremity edema This is likely secondary to third spacing from low protein status   Pleural effusion on right She is scheduled for urgent paracentesis today  Goals of care, counseling/discussion We have further discussions about goals of care and her poor prognosis given her recent CT imaging studies and hydronephrosis The patient was very upset and request second opinion Her oldest son, Arnette Norris wants to know estimated life expectancy but the patient's second son and herself would not want to know We did not discuss prognosis today She told me she will keep fighting She is inquired about kidney transplant and I told her she is not a candidate given her malignancy   No orders of the defined types were placed in this encounter.   All questions were answered. The patient knows to call the clinic with any problems, questions or concerns. The total time spent in the appointment was 40 minutes encounter with  patients including review of chart and various tests results, discussions about plan of care and coordination of care plan   Heath Lark, MD 01/13/2020 3:22 PM  INTERVAL HISTORY: Please see below for problem oriented charting. She is seen by urgently today to discuss further management of hydronephrosis Yesterday, I spoke with her urologist who recommend palliative care consult along with possible percutaneous nephrostomy tube placement The patient is verbally upset at the suggestion of percutaneous nephrostomy tube placement and the risks associated with it She felt that her complaints over the past 3 months were not addressed properly She is shocked by significant changes on her CT imaging  SUMMARY OF ONCOLOGIC HISTORY: Oncology History Overview Note  PD-L1 5%   Malignant neoplasm of endocervix ([place])  09/15/2017 Initial Diagnosis   The patient reports postcoital bleeding since August, 2019 and postmenopausal bleeding since November, 2019. 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    04/16/2018 Surgery   She was taken to the OR on 04/16/18 for a cervical cone biopsy. The specimen revealed a grossly normal cervix on the ectocervix.  Cone specimen revealed squamous cell carcinoma.  When discussing this with the pathologist he reported that in the actual cone specimen itself that was CIN 3 however there was a separate fragment of tissue that included carcinoma.  The dimensions of this was 0.6 cm, however this involved the margins.  A post cone ECC was positive for squamous cell carcinoma, as well as an endometrial curettage which also contain benign endometrial glands.    04/16/2018  Pathology Results   Endocervix curettage: [facility]   04/22/2018 Initial Diagnosis   Malignant neoplasm of endocervix ([place])   04/24/2018 PET scan   1. There is a large mass involving the cervix and endometrium which has a maximum dimension of  5.3 cm within SUV max of 20.4. 2. Small bilateral pelvic sidewall lymph nodes measuring less than 1cm with mild nonspecific uptake. The no hypermetabolic adenopathy or evidence of distant metastatic disease. 3. Nonspecific pulmonary nodules measuring less than 5 mm are identified in the right lung. Too small to characterize by PET-CT.    04/27/2018 Imaging   MRI pelvis  1. Uterine cervix 5.5 x 3.4 x 3.2 cm mass compatible with primary cervical malignancy, with mild parametrial invasion. Stage IIB by MRI. 2. Small volume simple free fluid in the pelvic cul-de-sac. 3. No pathologically enlarged pelvic lymph nodes, see comments.   05/01/2018 Cancer Staging   Staging form: Cervix Uteri, AJCC 8th Edition - Clinical: FIGO Stage IIB (cT2b, cN0, cM0) - Signed by Heath Lark, MD on 05/01/2018   05/08/2018 Procedure   Placement of a subcutaneous port device. Catheter tip at the superior cavoatrial junction.   05/14/2018 - 07/27/2018 Radiation Therapy   Radiation treatment dates:    1. 05/14/2018 - 06/24/2018 2. 5/21, 5/28, 6/1, 6/9, 07/27/2018   Site/dose:    1.         A. pelvsi / 45 Gy in 25 fractions              B. Parametrial  boost / 9 Gy in 5 fractions 2. Cervix, Tandem and Ring System / 27.5 Gy in 5 fractions     05/15/2018 - 06/26/2018 Chemotherapy   The patient had weekly cisplatin   10/27/2018 PET scan   Near complete resolution of FDG uptake at site of previously seen cervical mass. Resolution of previously seen sub-cm pelvic lymph nodes. No evidence of metabolically-active metastatic disease.   05/13/2019 Imaging   1. There is ill-defined, hypoenhancing soft tissue most clearly visualized at the left aspect of the uterus and adnexa, which obstructs the distal left ureter, measuring approximately 4.0 x 3.4 cm. This is concerning for local recurrence of cervical malignancy.   2. Ill-defined, hypoenhancing appearance of the cervix with loss of distinction of the fat plane to the posterior  urinary bladder, in keeping with primary malignancy.   3. Interval enlargement of numerous abnormal retroperitoneal and bilateral inguinal lymph nodes, consistent with nodal metastatic disease.    4. There is new moderate left, mild right hydronephrosis and hydroureter, with soft tissue described above obstructing the distal left ureter in the vicinity of the adnexa. There is high-grade obstruction of the distal left ureter with no excreted contrast on delayed phase imaging. Point of obstruction of the right ureter is not clearly visualized, possibly at the ureterovesicular junction.      Genetic Testing   Patient has genetic testing done for PD-L1. Results revealed patient has the following: PD-L1 staining in tumor cells (TC): 5% PD-L1 staining in tumor associated immune cells (IC): <1% PD-L1 combined positive score (CPS): 5%   05/28/2019 -  Chemotherapy   The patient had carboplatin and taxol for chemotherapy treatment.     07/29/2019 Imaging   1. Mild improvement in mild left paraaortic retroperitoneal lymphadenopathy. No new or progressive metastatic disease identified. 2. No significant change in size of soft tissue mass in the left adnexa and parametrium. 3. Stable diffuse bladder wall thickening, consistent with cystitis and likely due to  previous radiation therapy. 4. Interval placement of bilateral ureteral stents, with resolution of bilateral hydroureteronephrosis since prior study. 5. Stable diffuse left renal parenchymal atrophy. 6. Large stool burden noted; recommend clinical correlation for possible constipation.   09/01/2019 Surgery   Operative Note   Preoperative diagnosis:  1.  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  Same   Procedure(s): 1.  Cystoscopy with bilateral stent exchange 2.  Bilateral retrograde pyelograms with intraoperative interpretation of fluoroscopic imaging   Surgeon: Ellison Hughs,  MD    Specimens: 1.  Previously placed bilateral JJ stents were removed intact, inspected and discarded   Drains/Catheters: 1.  Bilateral 6 French, 24 cm Polaris stents without tethers   Intraoperative findings:   1. Right retrograde pyelogram revealed dilation starting in the mid to proximal aspects of the right ureter, extending up to the right renal pelvis.  No distal filling defects were identified. 2. Left retrograde pyelogram revealed dilation starting in the mid to proximal aspects of the left ureter, extending up to the left renal pelvis.  No distal filling defects were identified   10/14/2019 Imaging   1. Small RIGHT effusion increased from the previous exam with some suggestion of early diaphragmatic nodularity and with soft tissue nodules or small lymph nodes in the cardiophrenic recess, this along with thoracic inlet adenopathy compatible with disease in the chest. 2. Increase in abdominal ascites and subjective increase in fascial thickening in the lower abdomen and pelvis may reflect worsening of disease in this area. Attention on follow-up. 3. Accurate measurements are difficult with respect adnexal structures, increased fullness of the RIGHT adnexa since previous imaging. 4. Retroperitoneal lymph nodes with similar appearance. Stranding about retroperitoneal vessels with some increase. 5. Signs of presumed cystitis with bilateral nephroureteral stents in place not significantly changed from the previous study. 6. Post radiation changes about the rectum. Slightly indistinct appearance of the distal colon along the descending and sigmoid favored to represent increasing serosal involvement in the setting of peritoneal disease. Correlate with any clinical evidence of colitis. 7. Aortic atherosclerosis.   10/28/2019 -  Chemotherapy   The patient had pembrolizumab for chemotherapy treatment.     12/01/2019 Surgery   Preoperative diagnosis:  1.  Stage IV cervical cancer 2.   Bilateral hydronephrosis   Postoperative diagnosis: 1.  Stage IV cervical cancer 2.  Bilateral hydronephrosis   Procedure(s): 1.  Cystoscopy with bilateral stent exchange 2.  Bilateral retrograde pyelograms with intraoperative interpretation of fluoroscopic imaging   Surgeon: Ellison Hughs, MD Drains/Catheters: 1.  Left 6 French, 22 cm Polaris stent 2.  Right 5 French, 22 cm Polaris stent (a 5 French stent was utilized due to the lack of additional 6 [place] stents)   Intraoperative findings:   1. Left retrograde pyelogram revealed dilation of the left renal pelvis at the level of the UPJ with no additional filling defects seen within the renal pelvis or along the entire length of the left ureter 2. Right retrograde pyelogram revealed ureteral jetting at the level of the UPJ with uniform dilation of the right renal pelvis and its associated calyces.  No additional filling defects were seen within the right renal pelvis or along the length of the right ureter.   12/22/2019 Imaging   CT angiogram at [facility] 1.  No acute pulmonary embolism.  2.  Moderate right and small left pleural effusions with adjacent likely atelectasis.   12/22/2019 Imaging   [facility] venous Doppler at [facility]  No evidence of deep venous thrombosis.    01/11/2020 Imaging   Mild increase in size of poorly defined mass in the region of the cervix and parametrial soft tissues.   Mild increase in abdominal and pelvic lymphadenopathy, consistent with metastatic disease.   Increased peritoneal metastatic disease along the capsular surface of the liver and right hemidiaphragm. No evidence of ascites.   Increased diffuse mesenteric and body wall edema, without evidence of ascites.   New large bilateral pleural effusions, with compressive atelectasis of both lower lungs.     REVIEW OF SYSTEMS:   Constitutional: Denies fevers, chills or abnormal weight loss Eyes: Denies blurriness of vision Ears, nose, mouth, throat,  and face: Denies mucositis or sore throat Respiratory: Denies cough, dyspnea or wheezes Cardiovascular: Denies palpitation, chest discomfort Gastrointestinal:  Denies nausea, heartburn or change in bowel habits Skin: Denies abnormal skin rashes Lymphatics: Denies new lymphadenopathy or easy bruising Neurological:Denies numbness, tingling or new weaknesses Behavioral/Psych: Mood is stable, no new changes  All other systems were reviewed with the patient and are negative.  I have reviewed the past medical history, past surgical history, social history and family history with the patient and they are unchanged from previous note.  ALLERGIES:  has No Known Allergies.  MEDICATIONS:  Current Outpatient Medications  Medication Sig Dispense Refill  . acetaminophen (TYLENOL) 325 MG tablet Take 650 mg by mouth every 6 (six) hours as needed. 08-23-2019 Pt takes one tablet twice daily with one percocet    . cyclobenzaprine (FLEXERIL) 5 MG tablet Take 1 tablet (5 mg total) by mouth 3 (three) times daily as needed for muscle spasms. 30 tablet 0  . lactulose (CHRONULAC) 10 GM/15ML solution Take 15 mLs (10 g total) by mouth 3 (three) times daily. 473 mL 0  . methadone (DOLOPHINE) 10 MG tablet Take 1 tablet (10 mg total) by mouth every 12 (twelve) hours. 60 tablet 0  . neomycin-bacitracin-polymyxin (NEOSPORIN) ointment Apply 1 application topically every 12 (twelve) hours. To back prn    . ondansetron (ZOFRAN) 8 MG tablet Take 8 mg by mouth every 8 (eight) hours as needed.    Marland Kitchen oxybutynin (DITROPAN) 5 MG tablet Take 1 tablet (5 mg total) by mouth every 8 (eight) hours as needed for bladder spasms. (Patient taking differently: Take 5 mg by mouth 3 (three) times daily. ) 30 tablet 1  . Oxycodone HCl 10 MG TABS Take 1 tablet (10 mg total) by mouth every 4 (four) hours as needed. 60 tablet 0  . prochlorperazine (COMPAZINE) 10 MG tablet Take 1 tablet (10 mg total) by mouth every 6 (six) hours as needed (Nausea or  vomiting). 90 tablet 1  . SUMAtriptan (IMITREX) 100 MG tablet Take 100 mg by mouth every 2 (two) hours as needed for migraine. May repeat in 2 hours if headache persists or recurs.    . tamsulosin (FLOMAX) 0.4 MG CAPS capsule Take 0.4 mg by mouth 2 (two) times daily.     No current facility-administered medications for this visit.   Facility-Administered Medications Ordered in Other Visits  Medication Dose Route Frequency Provider Last Rate Last Admin  . lidocaine (XYLOCAINE) 1 % (with pres) injection             PHYSICAL EXAMINATION: ECOG PERFORMANCE STATUS: 2 - Symptomatic, <50% confined to bed  Vitals:   01/13/20 1353  BP: 112/80  Pulse: (!) 120  Resp: 18  Temp: 97.6 F (36.4 C)  SpO2: 94%   Filed Weights  01/13/20 1353  Weight: 150 lb (68 kg)    GENERAL:alert, no distress and comfortable NEURO: alert & oriented x 3 with fluent speech, no focal motor/sensory deficits  LABORATORY DATA:  I have reviewed the data as listed    Component Value Date/Time   NA 131 (L) 01/13/2020 1318   K 4.3 01/13/2020 1318   CL 96 (L) 01/13/2020 1318   CO2 24 01/13/2020 1318   GLUCOSE 125 (H) 01/13/2020 1318   BUN 26 (H) 01/13/2020 1318   CREATININE 1.86 (H) 01/13/2020 1318   CALCIUM 10.1 01/13/2020 1318   PROT 6.6 01/13/2020 1318   ALBUMIN 2.6 (L) 01/13/2020 1318   AST 12 (L) 01/13/2020 1318   ALT 6 01/13/2020 1318   ALKPHOS 110 01/13/2020 1318   BILITOT 0.3 01/13/2020 1318   GFRNONAA 32 (L) 01/13/2020 1318   GFRAA >60 11/11/2019 1109    No results found for: SPEP, UPEP  Lab Results  Component Value Date   WBC 8.4 01/13/2020   NEUTROABS 6.9 01/13/2020   HGB 9.7 (L) 01/13/2020   HCT 31.1 (L) 01/13/2020   MCV 94.8 01/13/2020   PLT 396 01/13/2020      Chemistry      Component Value Date/Time   NA 131 (L) 01/13/2020 1318   K 4.3 01/13/2020 1318   CL 96 (L) 01/13/2020 1318   CO2 24 01/13/2020 1318   BUN 26 (H) 01/13/2020 1318   CREATININE 1.86 (H) 01/13/2020 1318       Component Value Date/Time   CALCIUM 10.1 01/13/2020 1318   ALKPHOS 110 01/13/2020 1318   AST 12 (L) 01/13/2020 1318   ALT 6 01/13/2020 1318   BILITOT 0.3 01/13/2020 1318

## 2020-01-13 NOTE — Telephone Encounter (Signed)
Called and faxed referral to [facility] in [place] at [phone].

## 2020-01-13 NOTE — Assessment & Plan Note (Signed)
We have extensive discussions about risk and benefits of percutaneous nephrostomy tube placement The patient is very upset with her urologist and requests a second opinion We will try to refer her to a different urology practice for further management

## 2020-01-13 NOTE — Assessment & Plan Note (Signed)
She is seen back urgently today for further discussion about management of hydronephrosis Unfortunately, her renal function is worse We discussed the risk and benefits of percutaneous nephrostomy tubes placement The patient was very upset at the suggestion that she may die quickly due to poor prognosis She requests a second opinion I will refer her back to GYN surgeon for further discussion about plan of care I will see her back next week for further follow-up

## 2020-01-13 NOTE — Telephone Encounter (Signed)
Called son and told Thoracentesis scheduled at 2:30 pm today after appt with Dr. Alvy Bimler at [facility]. He verbalized understanding.

## 2020-01-13 NOTE — Assessment & Plan Note (Signed)
She is scheduled for urgent paracentesis today

## 2020-01-14 ENCOUNTER — Other Ambulatory Visit (HOSPITAL_COMMUNITY): Payer: Self-pay | Admitting: Urology

## 2020-01-14 DIAGNOSIS — N130 Hydronephrosis with ureteropelvic junction obstruction: Secondary | ICD-10-CM

## 2020-01-14 LAB — T4: T4, Total: 9 ug/dL (ref 4.5–12.0)

## 2020-01-19 ENCOUNTER — Telehealth: Payer: Self-pay

## 2020-01-19 NOTE — Telephone Encounter (Signed)
Called and spoke with son to make sure Sheena Torres was home from [facility] and planning on keeping appt tomorrow. She is home and planning on keeping appt as scheduled.

## 2020-01-20 ENCOUNTER — Inpatient Hospital Stay: Payer: Medicaid Other | Admitting: Hematology and Oncology

## 2020-01-20 ENCOUNTER — Inpatient Hospital Stay: Payer: Medicaid Other

## 2020-01-20 ENCOUNTER — Telehealth: Payer: Self-pay | Admitting: Hematology and Oncology

## 2020-01-20 NOTE — Telephone Encounter (Signed)
Patient did not show up for her appointment Attempted to call her son, Arnette Norris and left him a Advertising account executive

## 2020-01-27 ENCOUNTER — Telehealth: Payer: Self-pay | Admitting: Hematology and Oncology

## 2020-01-27 MED FILL — OXYBUTYNIN CL ER 10 MG TAB: 10 | 30 days supply | Qty: 30 | Fill #1

## 2020-01-27 NOTE — Telephone Encounter (Signed)
Released records to [facility] to [phone]   Release: [id number]

## 2020-03-14 DEATH — deceased

## 2020-06-25 IMAGING — XA IR LEFT FLUORO GUIDE CV LINE
1 series · 1 of 1 positions shown · non-contrast
Comparison: None.

INDICATION: 52-year-old with cervical cancer. Port-A-Cath needed for
chemotherapy.

EXAM:
FLUOROSCOPIC AND ULTRASOUND GUIDED PLACEMENT OF A SUBCUTANEOUS PORT

[Series 300: ir imaging guided port insertion · 1 of 1 slices shown]
[im 1/1]
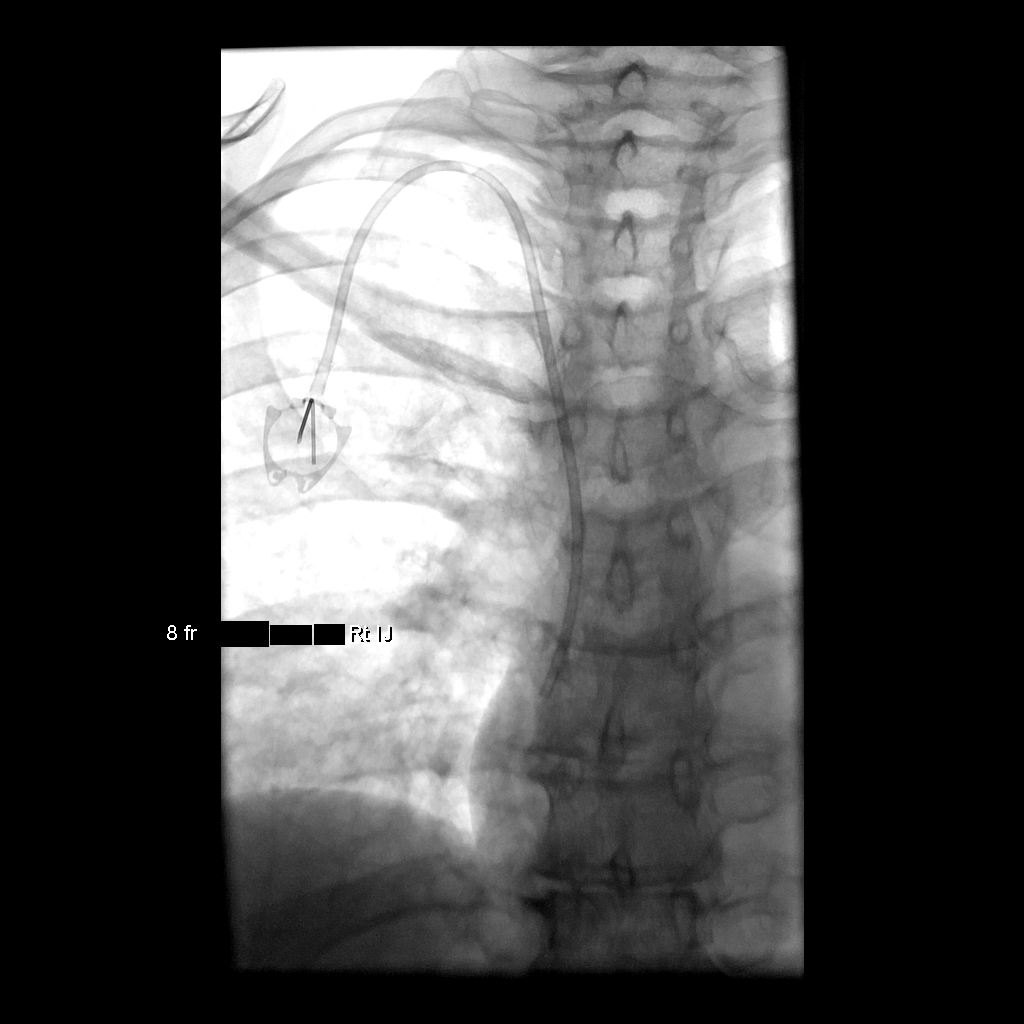

[1 of 1 positions shown; findings below may reference images not displayed]

MEDICATIONS:
Ancef 2 g; The antibiotic was administered within an appropriate
time interval prior to skin puncture.

ANESTHESIA/SEDATION:
Versed 3.0 mg IV; Fentanyl 100 mcg IV;

Moderate Sedation Time:  40 minutes

The patient was continuously monitored during the procedure by the
interventional radiology nurse under my direct supervision.

FLUOROSCOPY TIME:  30 seconds, 4 mGy

COMPLICATIONS:
None immediate.

PROCEDURE:
The procedure, risks, benefits, and alternatives were explained to
the patient. Questions regarding the procedure were encouraged and
answered. The patient understands and consents to the procedure.

Patient was placed supine on the interventional table. Ultrasound
confirmed a patent right internal jugular vein. Ultrasound image was
saved for documentation. The right chest and neck were cleaned with
a skin antiseptic and a sterile drape was placed. Maximal barrier
sterile technique was utilized including caps, mask, sterile gowns,
sterile gloves, sterile drape, hand hygiene and skin antiseptic. The
right neck was anesthetized with 1% lidocaine. Small incision was
made in the right neck with a blade. Micropuncture set was placed in
the right internal jugular vein with ultrasound guidance. The
micropuncture wire was used for measurement purposes. The right
chest was anesthetized with 1% lidocaine with epinephrine. #15 blade
was used to make an incision and a subcutaneous port pocket was
formed. 8 french Power Port was assembled. Subcutaneous tunnel was
formed with a stiff tunneling device. The port catheter was brought
through the subcutaneous tunnel. The port was placed in the
subcutaneous pocket and sutured in place. The micropuncture set was
exchanged for a peel-away sheath. The catheter was placed through
the peel-away sheath and the tip was positioned at the SVC/right
atrium junction. Catheter placement was confirmed with fluoroscopy.
The port was accessed and flushed with heparinized saline. The port
pocket was closed using two layers of absorbable sutures and
Dermabond. The vein skin site was closed using a single layer of
absorbable suture and Dermabond. Sterile dressings were applied.
Patient tolerated the procedure well without an immediate
complication. Ultrasound and fluoroscopic images were taken and
saved for this procedure.
IMPRESSION: Placement of a subcutaneous port device. Catheter tip at the
superior cavoatrial junction.

## 2020-08-11 ENCOUNTER — Encounter: Payer: Self-pay | Admitting: Hematology and Oncology

## 2021-09-15 IMAGING — CT CT ABD-PELV W/ CM
2 of 5 series · 14 of 46 positions shown, 16 images · IV contrast (omnipaque)
Comparison: 05/13/2019

CLINICAL DATA: Follow-up cervical carcinoma. Currently undergoing
chemotherapy. Mid and lower abdominal pain. Dysuria.

EXAM:
CT ABDOMEN AND PELVIS WITH CONTRAST
TECHNIQUE: Multidetector CT imaging of the abdomen and pelvis was performed
using the standard protocol following bolus administration of
intravenous contrast.
CONTRAST:  100mL OMNIPAQUE IOHEXOL 300 MG/ML  SOLN

[Series 2: axial st · axial · 0.70mm/px · z∈[-451,-36]mm · 11 of 101 slices shown, 13 images]
[im 9/101  soft-tissue]
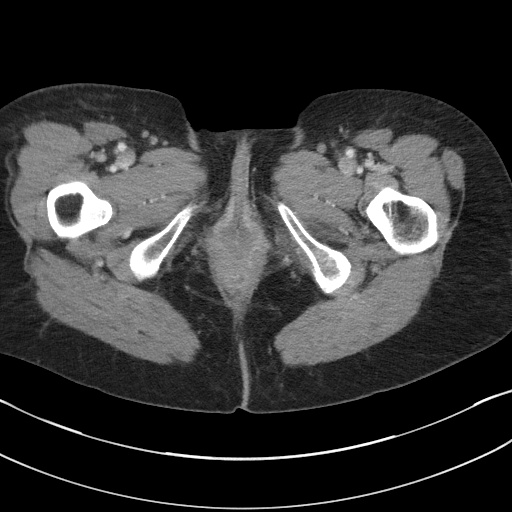
[im 9/101  bone]
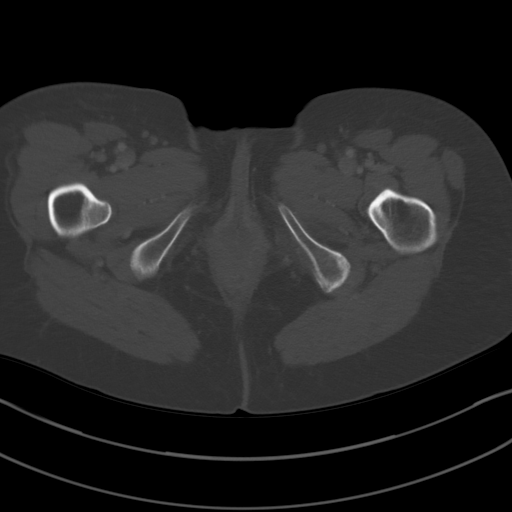
[im 17/101  soft-tissue]
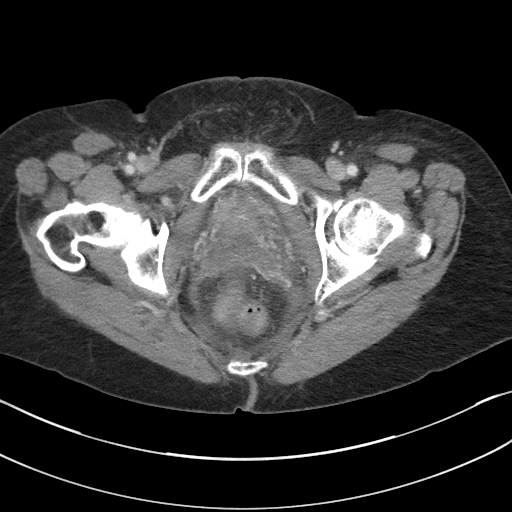
[im 26/101  soft-tissue]
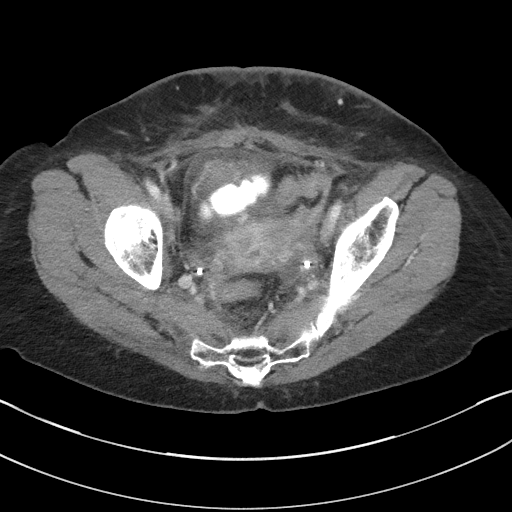
[im 34/101  soft-tissue]
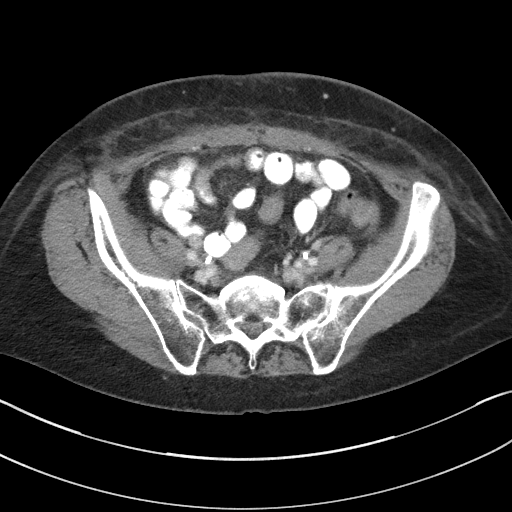
[im 42/101  soft-tissue]
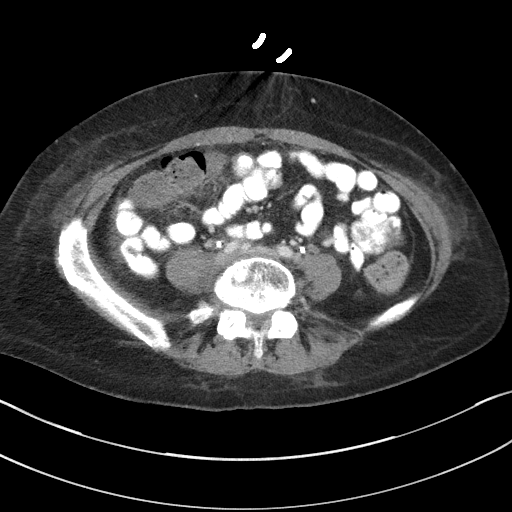
[im 51/101  soft-tissue]
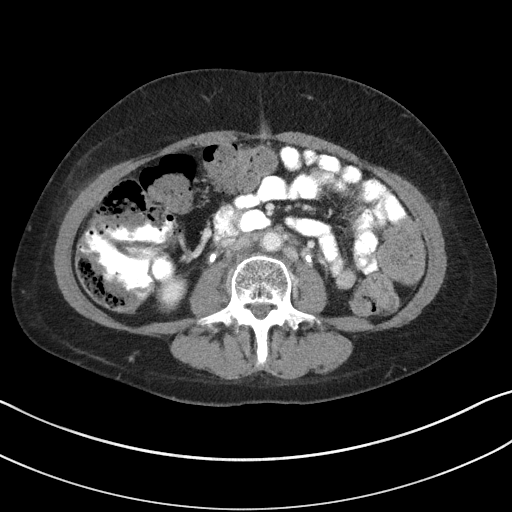
[im 59/101  soft-tissue]
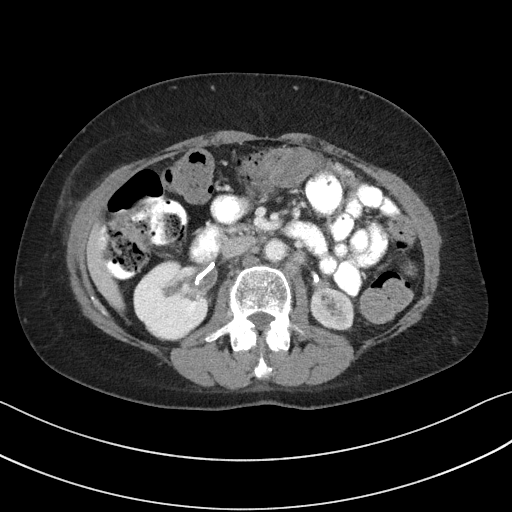
[im 67/101  soft-tissue]
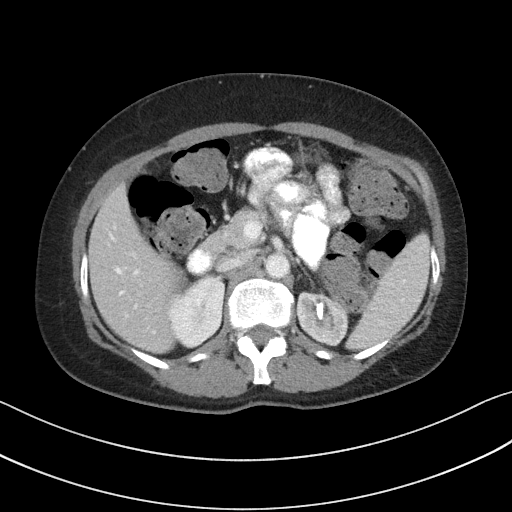
[im 76/101  soft-tissue]
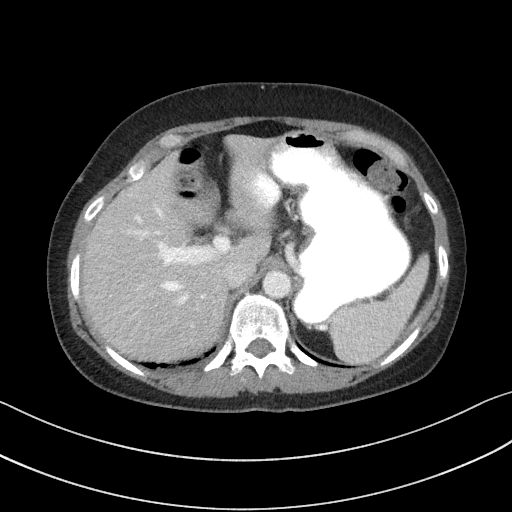
[im 76/101  bone]
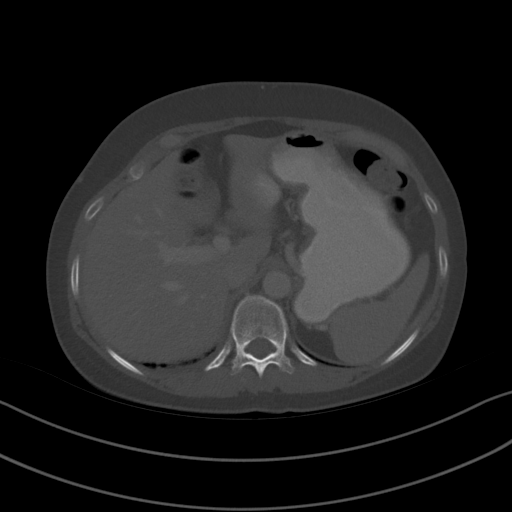
[im 84/101  soft-tissue]
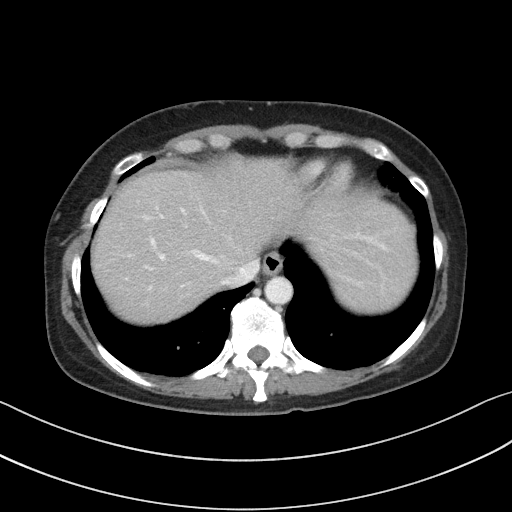
[im 92/101  soft-tissue]
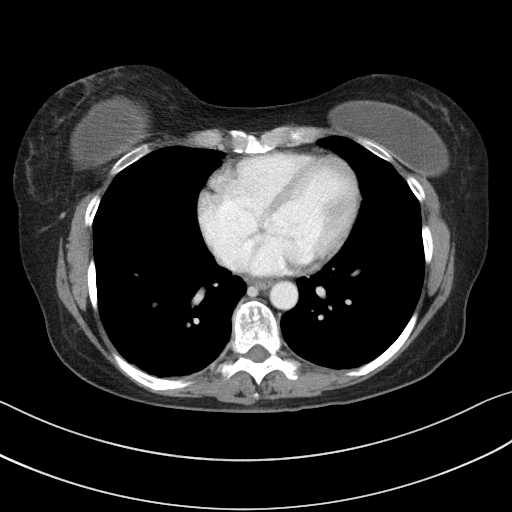

[Series 5: coronal st · coronal · 0.68mm/px · 3 of 101 slices shown]
[im 34/101  soft-tissue]
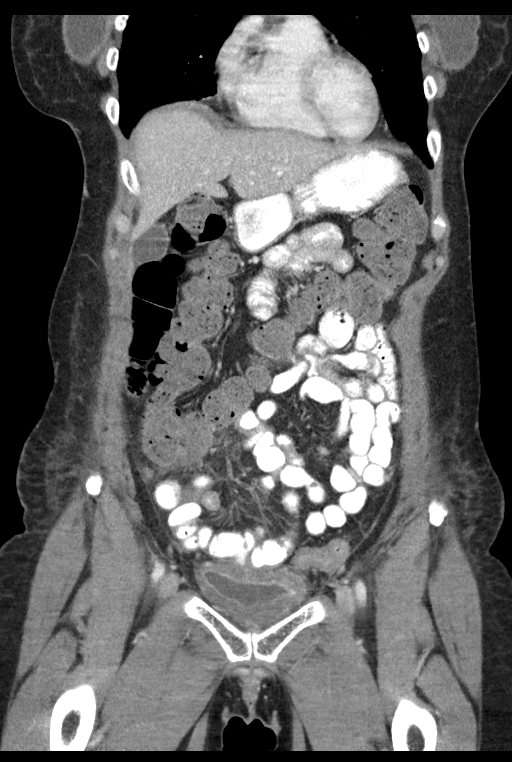
[im 45/101  soft-tissue]
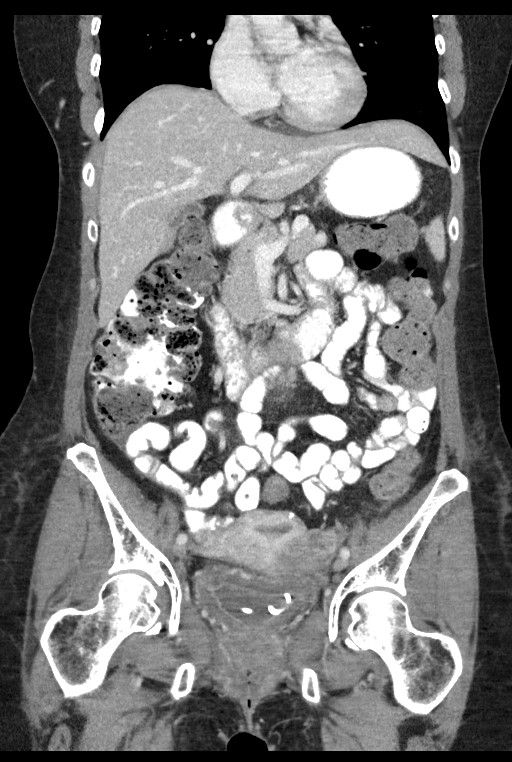
[im 56/101  soft-tissue]
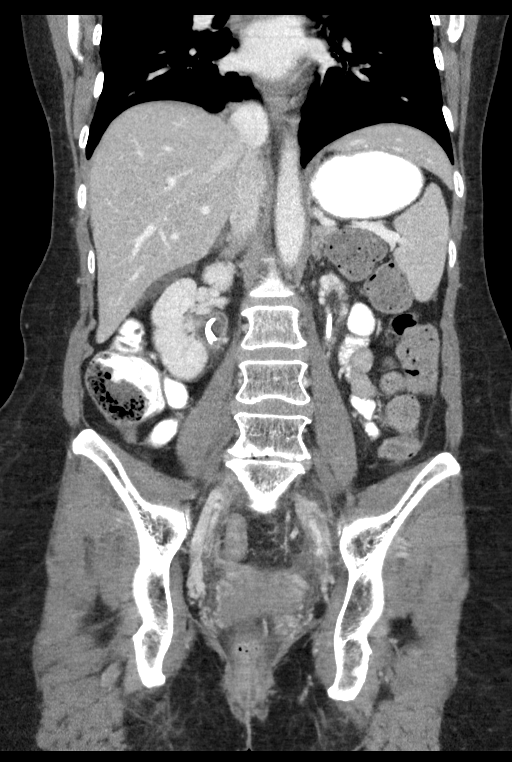

[14 of 46 positions shown; findings below may reference images not displayed]

FINDINGS: Lower Chest: No acute findings.

Hepatobiliary: No hepatic masses identified. Gallbladder is
unremarkable. No evidence of biliary ductal dilatation.

Pancreas:  No mass or inflammatory changes.

Spleen: Within normal limits in size and appearance.

Adrenals/Urinary Tract: No adrenal or renal masses are identified.
Diffuse left renal parenchymal atrophy is again demonstrated. There
has been placement of bilateral ureteral stents in appropriate
position since prior study, with resolution of bilateral
hydroureteronephrosis since prior study. Diffuse bladder wall
thickening is again seen which is consistent with cystitis and
likely due to previous radiation therapy.

Stomach/Bowel: No evidence of obstruction, inflammatory process or
abnormal fluid collections. Normal appendix visualized. Large amount
of stool seen throughout the colon, but significant change compared
to prior study.

Vascular/Lymphatic: Mild retroperitoneal lymphadenopathy is seen in
the left paraaortic region, but shows mild improvement since
previous study. Index lymph node on image 41/2 currently measures 12
x 9 mm, compared to 17 x 10 mm prior study. No new sites of
lymphadenopathy identified. No evidence abdominal aortic aneurysm.

Reproductive: Uterus and right adnexa are unremarkable in
appearance. A low-attenuation mass is again seen in the left adnexa
and parametrium which measures 3.7 x 3.5 cm on image 75/2. This
compares to 3.9 x 3.3 cm when remeasured on previous study. No new
or enlarging masses are identified. No evidence of ascites.

Other:  None.

Musculoskeletal:  No suspicious bone lesions identified.
IMPRESSION: 1. Mild improvement in mild left paraaortic retroperitoneal
lymphadenopathy. No new or progressive metastatic disease
identified.
2. No significant change in size of soft tissue mass in the left
adnexa and parametrium.
3. Stable diffuse bladder wall thickening, consistent with cystitis
and likely due to previous radiation therapy.
4. Interval placement of bilateral ureteral stents, with resolution
of bilateral hydroureteronephrosis since prior study.
5. Stable diffuse left renal parenchymal atrophy.
6. Large stool burden noted; recommend clinical correlation for
possible constipation.
